# Patient Record
Sex: Male | Born: 1945
Health system: Southern US, Community
[De-identification: ages and names within clinical notes are randomized; demographics above are authoritative.]

## PROBLEM LIST (undated history)

## (undated) DIAGNOSIS — T148XXA Other injury of unspecified body region, initial encounter: Secondary | ICD-10-CM

## (undated) DIAGNOSIS — E785 Hyperlipidemia, unspecified: Secondary | ICD-10-CM

## (undated) DIAGNOSIS — I10 Essential (primary) hypertension: Secondary | ICD-10-CM

## (undated) DIAGNOSIS — Z87442 Personal history of urinary calculi: Secondary | ICD-10-CM

## (undated) DIAGNOSIS — I4891 Unspecified atrial fibrillation: Secondary | ICD-10-CM

## (undated) DIAGNOSIS — I499 Cardiac arrhythmia, unspecified: Secondary | ICD-10-CM

## (undated) DIAGNOSIS — J302 Other seasonal allergic rhinitis: Secondary | ICD-10-CM

## (undated) DIAGNOSIS — M1990 Unspecified osteoarthritis, unspecified site: Secondary | ICD-10-CM

## (undated) DIAGNOSIS — K219 Gastro-esophageal reflux disease without esophagitis: Secondary | ICD-10-CM

## (undated) DIAGNOSIS — B958 Unspecified staphylococcus as the cause of diseases classified elsewhere: Secondary | ICD-10-CM

## (undated) HISTORY — DX: Hyperlipidemia, unspecified: E78.5

## (undated) HISTORY — DX: Gastro-esophageal reflux disease without esophagitis: K21.9

## (undated) HISTORY — PX: BACK SURGERY: SHX140

## (undated) HISTORY — PX: TONSILLECTOMY: SUR1361

## (undated) HISTORY — DX: Unspecified osteoarthritis, unspecified site: M19.90

## (undated) HISTORY — PX: TOOTH EXTRACTION: SUR596

## (undated) HISTORY — DX: Essential (primary) hypertension: I10

## (undated) HISTORY — PX: SPINE SURGERY: SHX786

---

## 1993-01-13 HISTORY — PX: HAND SURGERY: SHX662

## 1997-10-04 ENCOUNTER — Emergency Department (HOSPITAL_COMMUNITY): Admission: EM | Admit: 1997-10-04 | Discharge: 1997-10-04 | Payer: Self-pay | Admitting: Emergency Medicine

## 1997-10-26 ENCOUNTER — Emergency Department (HOSPITAL_COMMUNITY): Admission: EM | Admit: 1997-10-26 | Discharge: 1997-10-26 | Payer: Self-pay | Admitting: Emergency Medicine

## 1997-11-08 ENCOUNTER — Ambulatory Visit (HOSPITAL_BASED_OUTPATIENT_CLINIC_OR_DEPARTMENT_OTHER): Admission: RE | Admit: 1997-11-08 | Discharge: 1997-11-08 | Payer: Self-pay | Admitting: Orthopedic Surgery

## 1997-11-26 ENCOUNTER — Encounter: Payer: Self-pay | Admitting: Emergency Medicine

## 1997-11-26 ENCOUNTER — Emergency Department (HOSPITAL_COMMUNITY): Admission: EM | Admit: 1997-11-26 | Discharge: 1997-11-26 | Payer: Self-pay | Admitting: Emergency Medicine

## 2003-10-24 ENCOUNTER — Ambulatory Visit: Payer: Self-pay | Admitting: *Deleted

## 2004-02-21 ENCOUNTER — Emergency Department (HOSPITAL_COMMUNITY): Admission: EM | Admit: 2004-02-21 | Discharge: 2004-02-21 | Payer: Self-pay | Admitting: Emergency Medicine

## 2005-05-14 ENCOUNTER — Encounter: Payer: Self-pay | Admitting: Emergency Medicine

## 2009-05-25 ENCOUNTER — Emergency Department (HOSPITAL_COMMUNITY): Admission: EM | Admit: 2009-05-25 | Discharge: 2009-05-25 | Payer: Self-pay | Admitting: Emergency Medicine

## 2012-04-19 ENCOUNTER — Ambulatory Visit (INDEPENDENT_AMBULATORY_CARE_PROVIDER_SITE_OTHER): Payer: Medicare Other | Admitting: Family Medicine

## 2012-04-19 ENCOUNTER — Encounter: Payer: Self-pay | Admitting: Family Medicine

## 2012-04-19 VITALS — BP 156/90 | HR 84 | Temp 97.8°F | Ht 68.0 in | Wt 197.2 lb

## 2012-04-19 DIAGNOSIS — M109 Gout, unspecified: Secondary | ICD-10-CM

## 2012-04-19 DIAGNOSIS — R5381 Other malaise: Secondary | ICD-10-CM

## 2012-04-19 DIAGNOSIS — I10 Essential (primary) hypertension: Secondary | ICD-10-CM

## 2012-04-19 DIAGNOSIS — E785 Hyperlipidemia, unspecified: Secondary | ICD-10-CM

## 2012-04-19 DIAGNOSIS — R5383 Other fatigue: Secondary | ICD-10-CM

## 2012-04-19 LAB — POCT CBC
Granulocyte percent: 78.4 %G (ref 37–80)
HCT, POC: 47.4 % (ref 43.5–53.7)
Hemoglobin: 16 g/dL (ref 14.1–18.1)
Lymph, poc: 1.8 (ref 0.6–3.4)
MCH, POC: 31.5 pg — AB (ref 27–31.2)
MCHC: 33.6 g/dL (ref 31.8–35.4)
MCV: 93.6 fL (ref 80–97)
MPV: 9.4 fL (ref 0–99.8)
POC Granulocyte: 8.2 — AB (ref 2–6.9)
POC LYMPH PERCENT: 17.7 %L (ref 10–50)
Platelet Count, POC: 177 10*3/uL (ref 142–424)
RBC: 5.1 M/uL (ref 4.69–6.13)
RDW, POC: 13.2 %
WBC: 10.4 10*3/uL — AB (ref 4.6–10.2)

## 2012-04-19 LAB — BASIC METABOLIC PANEL
BUN: 17 mg/dL (ref 6–23)
CO2: 28 mEq/L (ref 19–32)
Calcium: 9.5 mg/dL (ref 8.4–10.5)
Chloride: 103 mEq/L (ref 96–112)
Creat: 1.01 mg/dL (ref 0.50–1.35)
Glucose, Bld: 99 mg/dL (ref 70–99)
Potassium: 3.8 mEq/L (ref 3.5–5.3)
Sodium: 139 mEq/L (ref 135–145)

## 2012-04-19 LAB — URIC ACID: Uric Acid, Serum: 7.9 mg/dL — ABNORMAL HIGH (ref 4.0–6.0)

## 2012-04-20 DIAGNOSIS — I10 Essential (primary) hypertension: Secondary | ICD-10-CM | POA: Insufficient documentation

## 2012-04-20 DIAGNOSIS — M109 Gout, unspecified: Secondary | ICD-10-CM | POA: Insufficient documentation

## 2012-04-20 DIAGNOSIS — E785 Hyperlipidemia, unspecified: Secondary | ICD-10-CM | POA: Insufficient documentation

## 2012-04-20 NOTE — Progress Notes (Signed)
  Subjective:    Patient ID: STEAVEN WHOLEY, male    DOB: [birth year], 67 y.o.   MRN: [id number]  HPI This patient called the night before complaining of severe pain in his right foot. He thinks it is gout. His current meds include meclizine 25 twice daily as needed, lisinopril 2025 one fourth of a pill daily, and omeprazole 20 one daily as needed. He had been taking Naprosyn  over the counter 3 times daily as needed.   Review of Systems  Constitutional: Negative.   HENT: Positive for congestion.   Eyes: Negative.   Respiratory: Negative.   Cardiovascular: Negative.   Gastrointestinal:       GERD  Genitourinary: Negative.   Musculoskeletal: Positive for back pain, joint swelling, arthralgias and gait problem.       The reason for the visit was the severe pain in the right foot especially the big toe  Skin: Positive for color change.       Redness and erythema of the entire foot involved with the gout  Neurological:       Neuropathy in his legs from his back  Hematological: Negative.        Objective:   Physical Exam  Nursing note and vitals reviewed. Constitutional: He is oriented to person, place, and time. He appears well-developed and well-nourished. He appears distressed (due to foot pain).  HENT:  Head: Normocephalic and atraumatic.  Missing Teeth  Eyes: Conjunctivae and EOM are normal.  Neck: Normal range of motion. Neck supple. No JVD present. No thyromegaly present.  Cardiovascular: Normal rate, regular rhythm, normal heart sounds and intact distal pulses.  Exam reveals no gallop and no friction rub.   No murmur heard. Pulmonary/Chest: Effort normal and breath sounds normal. No respiratory distress. He has no wheezes. He has no rales.  Abdominal: Soft. He exhibits no distension and no mass. There is no tenderness. There is no rebound and no guarding.  Musculoskeletal: He exhibits edema and tenderness.  Right foot swollen red tender to palpation and painful with movement  especially the right great toe  Lymphadenopathy:    He has no cervical adenopathy.  Neurological: He is alert and oriented to person, place, and time. He has normal reflexes.  Skin: There is erythema.  Right foot and right great toe  Psychiatric: He has a normal mood and affect. His behavior is normal. Judgment and thought content normal.          Assessment & Plan:  1. Other malaise and fatigue - POCT CBC  2. Essential hypertension, benign - Basic metabolic panel  3. Gout, unspecified - Uric Acid  4. Hyperlipemia Will review lab work from the [place]  5. Discontinue lisinopril 20/25     Start lisinopril 20 one daily as directed       Prednisone 10 mg #20 taper            Colcrys .6mg   2 stat, then one by mouth one hour later. Then 1 daily       Allopurinol 100 mg, 1 daily to start in one week  6. Return to clinic in 10-14 days.      Bring home blood pressure readings, recheck blood pressure, recheck uric acid.

## 2012-04-20 NOTE — Patient Instructions (Addendum)
Take meds as directed Return to clinic in 10-14 days and bring blood pressure readings from home and recheck uric acid Elevate foot Needs cholesterol checked if this has not been done by the [place] [facility] lab work that was done at the [place] from home Will probably also need a rectal exam at some time in the future

## 2012-07-23 ENCOUNTER — Telehealth: Payer: Self-pay | Admitting: Family Medicine

## 2012-07-23 ENCOUNTER — Ambulatory Visit (INDEPENDENT_AMBULATORY_CARE_PROVIDER_SITE_OTHER): Payer: Medicare Other | Admitting: Family Medicine

## 2012-07-23 ENCOUNTER — Encounter: Payer: Self-pay | Admitting: Family Medicine

## 2012-07-23 VITALS — BP 154/97 | HR 90 | Temp 97.4°F | Wt 206.0 lb

## 2012-07-23 DIAGNOSIS — M109 Gout, unspecified: Secondary | ICD-10-CM

## 2012-07-23 DIAGNOSIS — N508 Other specified disorders of male genital organs: Secondary | ICD-10-CM

## 2012-07-23 DIAGNOSIS — N5089 Other specified disorders of the male genital organs: Secondary | ICD-10-CM

## 2012-07-23 NOTE — Patient Instructions (Signed)
Testicular Masses  Most testicular masses, such as a growth or a swelling, are benign. This means they are not cancerous. Common types of testicular masses include:    Hydrocele is the most common benign testicular mass in an adult. Hydroceles are generally soft, painless scrotal swellings that are collections of fluid in the scrotal sac. These can rapidly change size as the fluid enters or leaves.   Spermatoceles are generally soft, painless, benign swellings that are cyst-like masses in the scrotum containing fluid. They can rapidly change size as the fluid enters or leaves. They are more prominent while standing or exercising. Sometimes, spermatoceles may cause a sensation of heaviness or a dull ache.   Varicocele is an enlargement of the veins that drain the testicles. This condition can increase the risk of infertility. They are more prominent while standing or exercising. Sometimes, varicoceles may cause a sensation of heaviness or a dull ache.   Inguinal hernia is a bulge caused by a portion of intestine protruding into the scrotum through a weak area in the abdominal muscles. Hernias may or may not be painful. They are soft and usually enlarge with coughing or straining.   Torsion of the testis can cause a testicular mass that develops quickly and is associated with tenderness and/or fever. This is caused by a twisting of the testicle within the sac. It also reduces the blood supply and can destroy the testis if not treated quickly with surgery.   Epididymitis is inflammation of the epididymis (a structure attached to the testicle), usually caused by a sexually transmitted infection or a urinary tract infection. This generally shows up as testicular discomfort and swelling, and may include pain during urination.   Testicular appendages are remnants of tissue on the testis present since birth. A testicular appendage can twist on its blood supply and cause pain. In most cases, this is seen as a blue dot  on the scrotum.  A cancerous growth in the scrotum may first appear as a swelling. There may or may not be pain. The growth usually feels firm and shows up as a growth on the testicle. Any solid, firm growth in a testicle is considered cancer until proven otherwise.  Cancer of the testicle most commonly affects men 20 to 65 years old. Risk factors include prior testicular tumor and cryptorchidism (undescended testis). Occasionally, testicular cancer may appear with symptoms (problems) of metastasis. This means the tumor (abnormal growth) has spread and is causing other problems that may include cough, shortness of breath or weight loss. Monthly testicular self-exams are recommended for all men. Get in the habit of examining your own testicles. A good time is while taking a shower. Get to know what your testicles feel like so you will know if there is a new growth or change in them.  DIAGNOSIS   See your caregiver if you feel a growth in your testicle. Sometimes, all that is needed to make the diagnosis (determine what is wrong) is a physical exam. Your caregiver may shine a bright light through the scrotum to help make the diagnosis. This is called transillumination. The light will shine easily through a collection of fluid but will usually not shine through a tumor. Other testing, including blood tests and an ultrasound exam, may be done. An ultrasound exam bounces harmless sound waves off the testicles and produces a black and white picture almost like that produced by a camera. Diagnosis of testicular cancer can be made by measuring several substances in the   blood, called markers), that may indicate the presence of certain cancers.  TREATMENT   What is wrong determines how it is treated. Small hydroceles and spermatoceles often require no treatment. In some cases, however, they may be treated surgically. Hernias are repaired with surgery. Because epididymitis is usually caused by an infection, it is usually  treated with antibiotics. Varicoceles may be treated by surgery to tie off the affected veins. Testicular cancer treatment depends upon the type of cancer. Sometimes, some tissue is removed surgically as a way of trying to preserve the testicle but if a tumor is suspected, the preferred treatment is removal of the entire testicle. Further treatment may include watching the growth with strict follow-up, chemotherapy or radiation.  If a growth has been found in a testicle, your caregiver will help you determine the best treatment.  Document Released: 07/06/2002 Document Revised: 03/24/2011 Document Reviewed: 12/30/2004  [employer] Patient Information 2014 [employer].

## 2012-07-23 NOTE — Progress Notes (Signed)
  Subjective:    Patient ID: Jacob Sanders, male    DOB: [birth year], 67 y.o.   MRN: [id number]  HPI This 67 y.o. male presents for evaluation of  Gout flare in his right leg and left testicular mass. He just has started taking his allopurinol for hyperuricemia and gout about a week ago and notices Some twinges in his right foot that feel like gout.  He takes allopurinol prn for his gout he states.  He  Also takes naprosyn po qd prn arthralgias or gout pain.  Well this week his pain in his right foot radiated up To his groin and he noticed a mass on his left teste and wants it examined.   Review of Systems C/o left testicular mass and right foot discomfort.    No chest pain, SOB, HA, dizziness, vision change, N/V, diarrhea, constipation, dysuria, urinary urgency or frequency, myalgias, arthralgias or rash.  Objective:   Physical Exam Vital signs noted  Well developed well nourished male.  HEENT - Head atraumatic Normocephalic                Eyes - PERRLA, Conjuctiva - clear Sclera- Clear EOMI                Ears - EAC's Wnl TM's Wnl Gross Hearing WNL                Nose - Nares patent                 Throat - oropharanx wnl Respiratory - Lungs CTA bilateral Cardiac - RRR S1 and S2 without murmur GI - Abdomen soft Nontender and bowel sounds active x  GU - Soft round mass left testicle palpated. Extremities - No edema. Neuro - Grossly intact. MS -TTP right foot.      Assessment & Plan:  Testicular mass - Plan: US Scrotum.  Reassured him that this was probably just varicocele but will get [place] and make sure.  If any abnormalities will call and inform him What to do next.  Gout - Instructed patient to take his allopurinol qd.  If he is taking 300mg  then cut in half and take for a month before going up to 100mg .  If he is on 100mg  then take one po qd. Discussed why he needs to take the allopurinol daily and not prn.  Discussed that he should take the naprosyn 500mg  po bid for next  week or two while he is getting up on the  Allopurinol to a help avoid flares.  Follow up prn if having gout flare.

## 2012-07-23 NOTE — Telephone Encounter (Signed)
appt given for today 

## 2012-07-27 ENCOUNTER — Other Ambulatory Visit: Payer: Self-pay | Admitting: Family Medicine

## 2012-07-27 ENCOUNTER — Other Ambulatory Visit (HOSPITAL_COMMUNITY): Payer: Self-pay | Admitting: Family Medicine

## 2012-07-27 ENCOUNTER — Ambulatory Visit (HOSPITAL_COMMUNITY)
Admission: RE | Admit: 2012-07-27 | Discharge: 2012-07-27 | Disposition: A | Discharge status: Home / Self Care | Payer: Medicare Other | Source: Ambulatory Visit | Attending: Family Medicine | Admitting: Family Medicine

## 2012-07-27 DIAGNOSIS — N5089 Other specified disorders of the male genital organs: Secondary | ICD-10-CM

## 2012-07-27 DIAGNOSIS — N508 Other specified disorders of male genital organs: Secondary | ICD-10-CM | POA: Insufficient documentation

## 2012-07-27 IMAGING — US US SCROTUM
1 series · 13 of 25 positions shown · non-contrast
Comparison: There are

CLINICAL DATA: Left scrotal mass

SCROTAL ULTRASOUND
DOPPLER ULTRASOUND OF THE TESTICLES
TECHNIQUE: Complete ultrasound examination of the testicles,
epididymis, and other scrotal structures was performed.  Color and
spectral Doppler ultrasound were also utilized to evaluate blood
flow to the testicles.

[Series 1: us scrotum · 0.06mm/px · 13 of 72 slices shown]
[im 1/72]
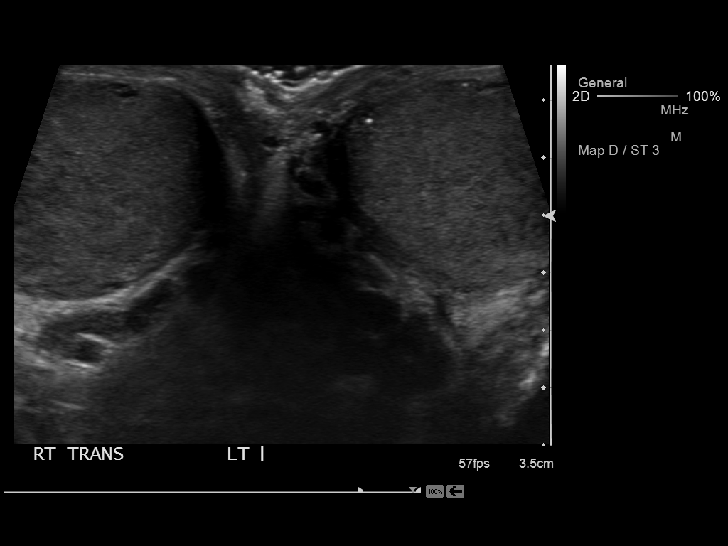
[im 6/72]
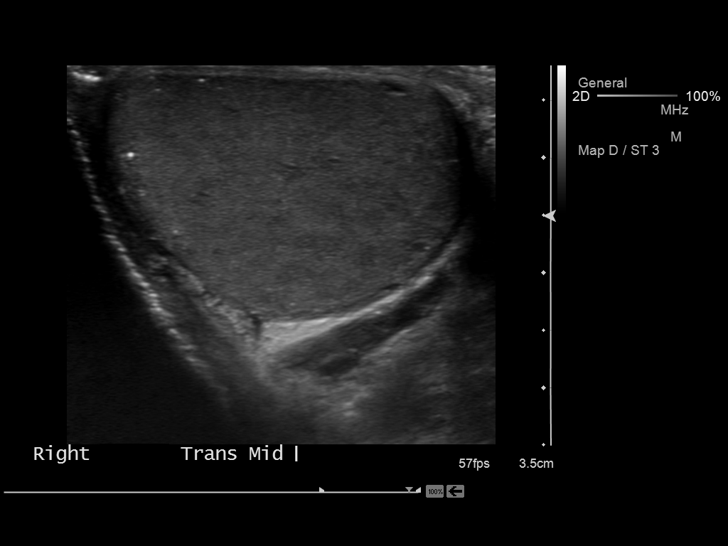
[im 12/72]
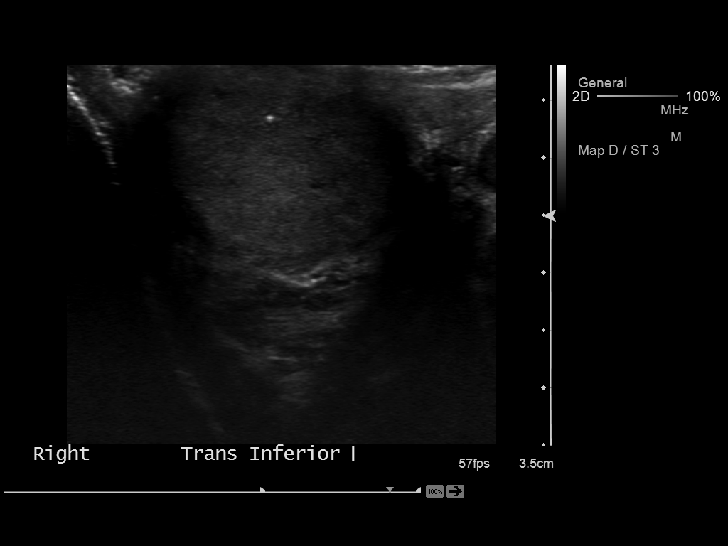
[im 18/72]
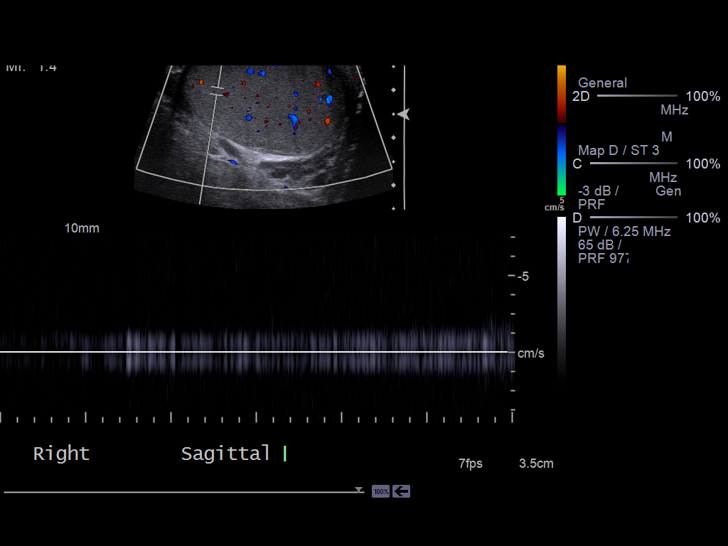
[im 24/72]
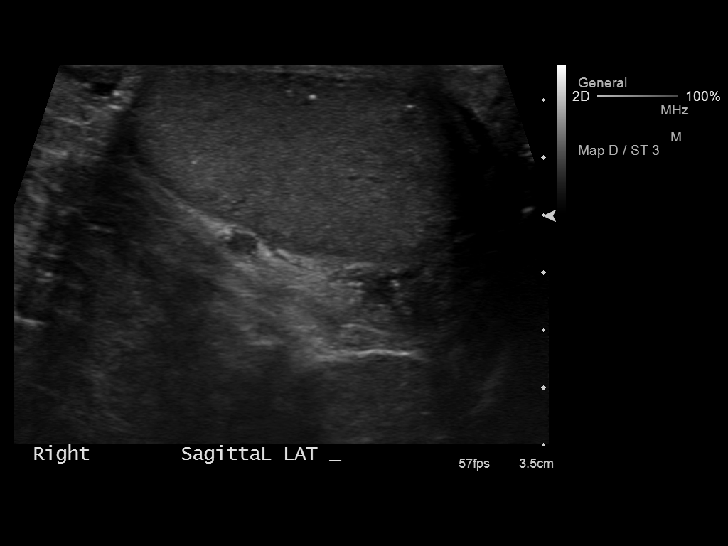
[im 30/72]
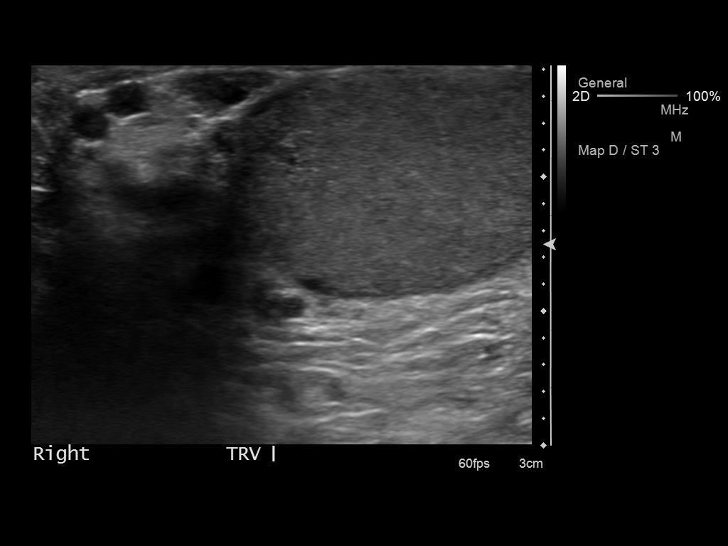
[im 36/72]
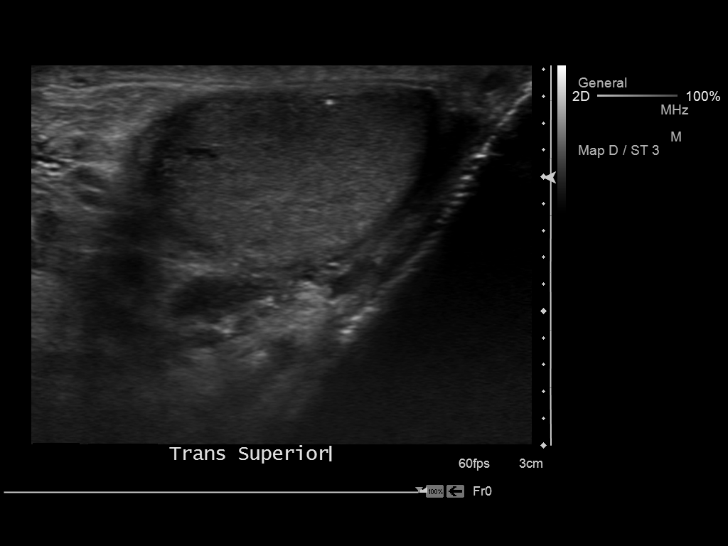
[im 42/72]
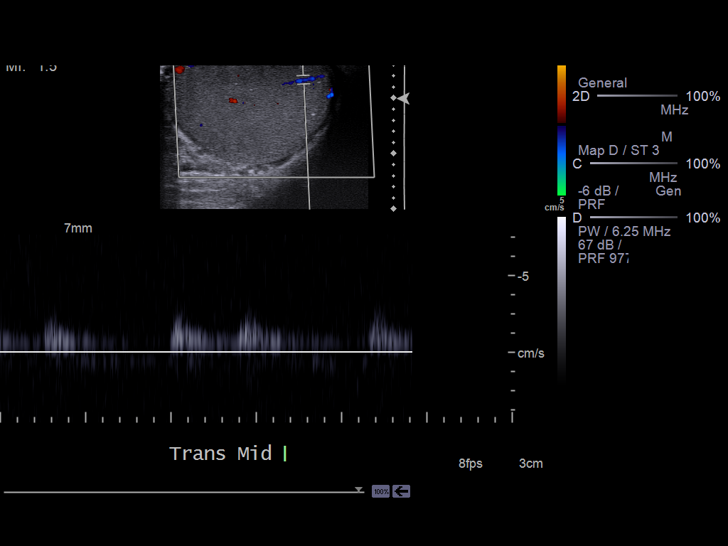
[im 48/72]
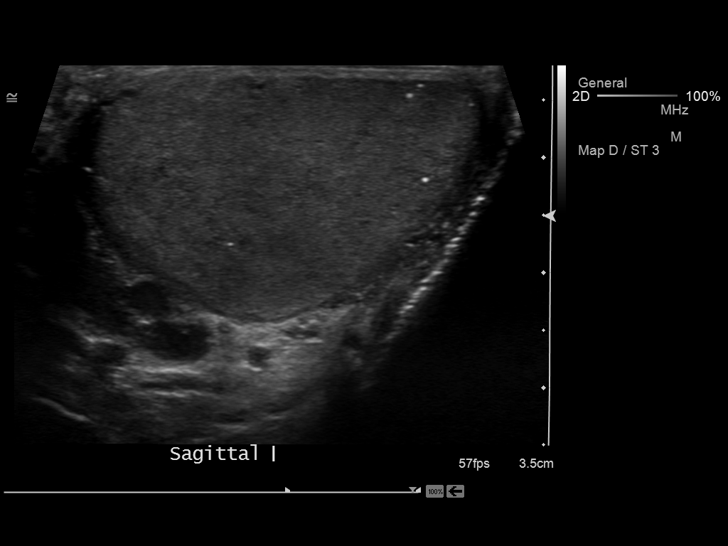
[im 54/72]
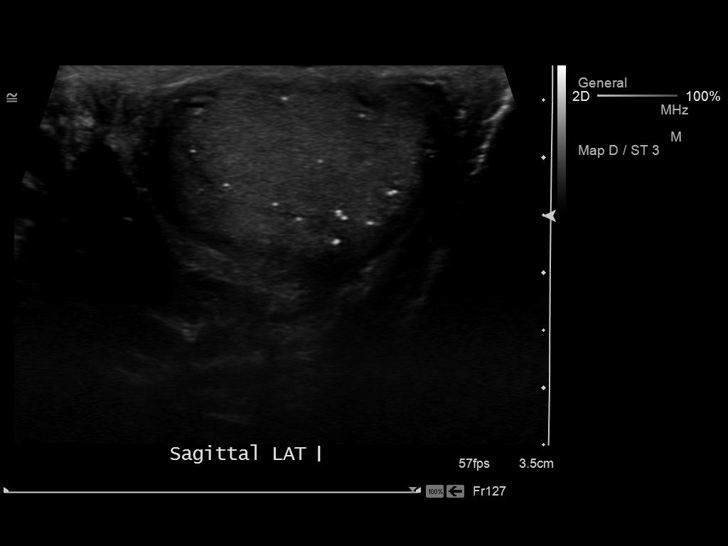
[im 60/72]
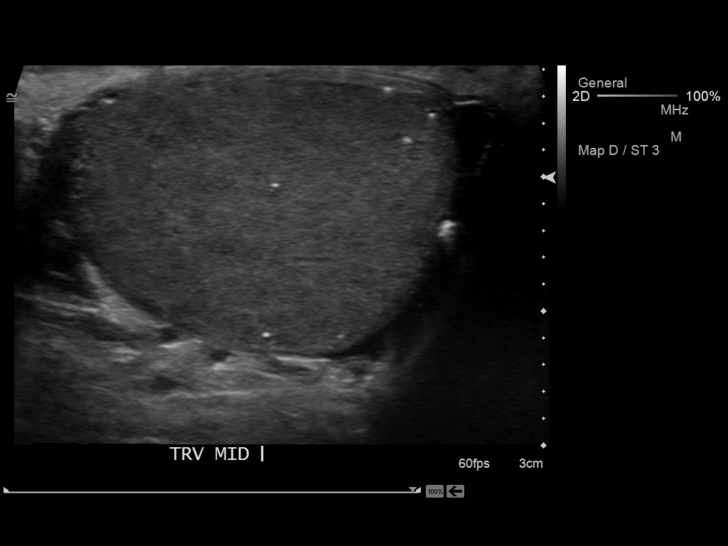
[im 66/72]
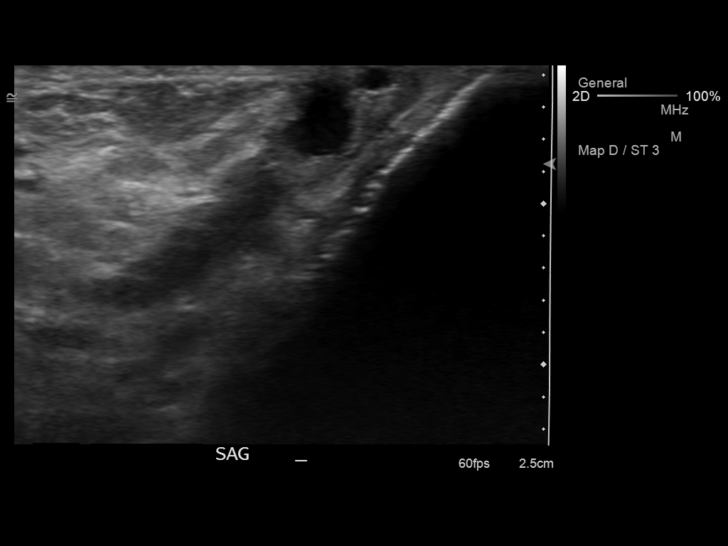
[im 72/72]
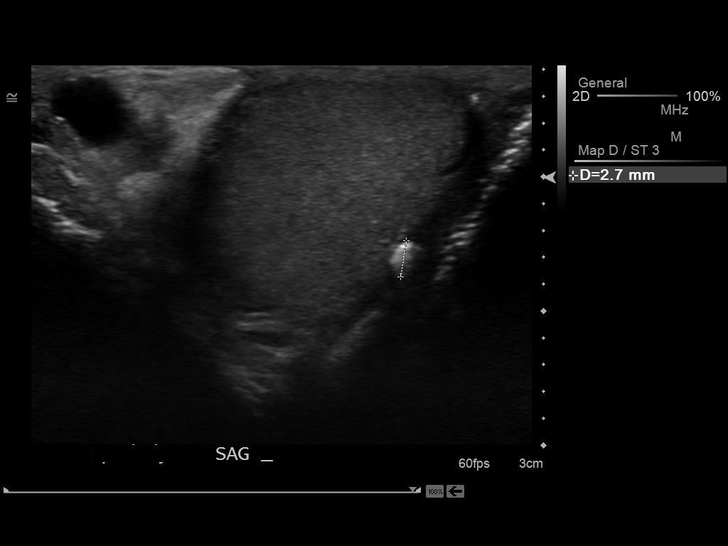

[13 of 25 positions shown; findings below may reference images not displayed]

FINDINGS: Right testis:  No solid mass.  Several punctate calcifications are
scattered throughout the right testicle.

Left testis:  No solid mass.  Several punctate calcifications are
scattered throughout the parenchyma.

Right epididymis:  4 cm simple cyst within the mid epididymis.
Otherwise within normal limits.

Left epididymis:  6 mm simple cyst within the mid left epididymis.
No solid mass.

Hydrocele:  Absent.

Varicocele:  Absent.

Pulsed Doppler interrogation of both testes demonstrates normal
appearing arterial and venous waveforms within both testicles.
IMPRESSION: No evidence of testicular mass.

6 mm simple left epididymal cyst likely corresponding to the
palpable abnormality.

Bilateral testicular microlithiasis.  This is a risk factor in the
development of testicular cancer.  Follow-up is based on a
combination of imaging and clinical risk factors.

## 2012-07-29 ENCOUNTER — Encounter: Payer: Self-pay | Admitting: Physician Assistant

## 2012-07-29 ENCOUNTER — Telehealth: Payer: Self-pay | Admitting: Physician Assistant

## 2012-07-29 ENCOUNTER — Ambulatory Visit (INDEPENDENT_AMBULATORY_CARE_PROVIDER_SITE_OTHER): Payer: Medicare Other | Admitting: Physician Assistant

## 2012-07-29 VITALS — BP 187/110 | HR 84 | Temp 98.3°F | Ht 68.0 in | Wt 203.0 lb

## 2012-07-29 DIAGNOSIS — M25561 Pain in right knee: Secondary | ICD-10-CM

## 2012-07-29 DIAGNOSIS — M25569 Pain in unspecified knee: Secondary | ICD-10-CM

## 2012-07-29 DIAGNOSIS — M25551 Pain in right hip: Secondary | ICD-10-CM

## 2012-07-29 DIAGNOSIS — M25559 Pain in unspecified hip: Secondary | ICD-10-CM

## 2012-07-29 NOTE — Patient Instructions (Signed)
Hip Pain  The hips join the upper legs to the lower pelvis. The bones, cartilage, tendons, and muscles of the hip joint perform a lot of work each day holding your body weight and allowing you to move around.  Hip pain is a common symptom. It can range from a minor ache to severe pain on 1 or both hips. Pain may be felt on the inside of the hip joint near the groin, or the outside near the buttocks and upper thigh. There may be swelling or stiffness as well. It occurs more often when a person walks or performs activity. There are many reasons hip pain can develop.  CAUSES   It is important to work with your caregiver to identify the cause since many conditions can impact the bones, cartilage, muscles, and tendons of the hips. Causes for hip pain include:  · Broken (fractured) bones.  · Separation of the thighbone from the hip socket (dislocation).  · Torn cartilage of the hip joint.  · Swelling (inflammation) of a tendon (tendonitis), the sac within the hip joint (bursitis), or a joint.  · A weakening in the abdominal wall (hernia), affecting the nerves to the hip.  · Arthritis in the hip joint or lining of the hip joint.  · Pinched nerves in the back, hip, or upper thigh.  · A bulging disc in the spine (herniated disc).  · Rarely, bone infection or cancer.  DIAGNOSIS   The location of your hip pain will help your caregiver understand what may be causing the pain. A diagnosis is based on your medical history, your symptoms, results from your physical exam, and results from diagnostic tests. Diagnostic tests may include X-ray exams, a computerized magnetic scan (magnetic resonance imaging, MRI), or bone scan.  TREATMENT   Treatment will depend on the cause of your hip pain. Treatment may include:  · Limiting activities and resting until symptoms improve.  · Crutches or other walking supports (a cane or brace).  · Ice, elevation, and compression.  · Physical therapy or home exercises.  · Shoe inserts or special  shoes.  · Losing weight.  · Medications to reduce pain.  · Undergoing surgery.  HOME CARE INSTRUCTIONS   · Only take over-the-counter or prescription medicines for pain, discomfort, or fever as directed by your caregiver.  · Put ice on the injured area:  · Put ice in a plastic bag.  · Place a towel between your skin and the bag.  · Leave the ice on for 15-20 minutes at a time, 3-4 times a day.  · Keep your leg raised (elevated) when possible to lessen swelling.  · Avoid activities that cause pain.  · Follow specific exercises as directed by your caregiver.  · Sleep with a pillow between your legs on your most comfortable side.  · Record how often you have hip pain, the location of the pain, and what it feels like. This information may be helpful to you and your caregiver.  · Ask your caregiver about returning to work or sports and whether you should drive.  · Follow up with your caregiver for further exams, therapy, or testing as directed.  SEEK MEDICAL CARE IF:   · Your pain or swelling continues or worsens after 1 week.  · You are feeling unwell or have chills.  · You have increasing difficulty with walking.  · You have a loss of sensation or other new symptoms.  · You have questions or concerns.  SEEK   IMMEDIATE MEDICAL CARE IF:   · You cannot put weight on the affected hip.  · You have fallen.  · You have a sudden increase in pain and swelling in your hip.  · You have a fever.  MAKE SURE YOU:   · Understand these instructions.  · Will watch your condition.  · Will get help right away if you are not doing well or get worse.  Document Released: 06/19/2009 Document Revised: 03/24/2011 Document Reviewed: 06/19/2009  [employer]® Patient Information ©2014 [employer].

## 2012-07-29 NOTE — Progress Notes (Signed)
Subjective:     Patient ID: Jacob Sanders, male   DOB: 01-01-1946, 67 y.o.   MRN: [id number]  HPI Pt with pain to the R leg He states sometimes sx will be to the lower leg at other times will radiate up the entire leg Pt with a hx of gout, degen disc dz, and OA to mult joints He denies any trauma to the leg  Review of Systems  All other systems reviewed and are negative.       Objective:   Physical Exam NAD- sitting comfortably No edema noted to the R lower leg Good pulses/sensory No TTP foot/ankle/calf FROM foot/ankle FROM of the knee- no crepitus noted Sl TTP quad area Sx with lateral abduction/ frog leg of the R hip Good strength     Assessment:     R leg pain    Plan:     Pt with OA of mult joints Suspect same of the hip Pt defers Xray today Cont with all meds He would like to just cont with OTC NSAID F/U if sx cont or change

## 2012-07-29 NOTE — Telephone Encounter (Signed)
He has a epididymis cyst which is a benign lesion but if it is hurting him he needs to see urology so he would need a urology referral.  If he has hip or knee pain this is an orthopedic condition and he needs a follow up appointment to be evaluated and tx'd for thisl.

## 2012-07-29 NOTE — Telephone Encounter (Signed)
Patient has an appointment today with Jacob Sanders

## 2013-03-22 ENCOUNTER — Ambulatory Visit: Payer: Medicare Other | Admitting: Family Medicine

## 2013-06-20 ENCOUNTER — Encounter: Payer: Self-pay | Admitting: Family Medicine

## 2013-06-20 ENCOUNTER — Ambulatory Visit (INDEPENDENT_AMBULATORY_CARE_PROVIDER_SITE_OTHER): Payer: Medicare Other | Admitting: Family Medicine

## 2013-06-20 ENCOUNTER — Telehealth: Payer: Self-pay | Admitting: Family Medicine

## 2013-06-20 VITALS — BP 148/88 | HR 46 | Temp 96.8°F | Ht 68.0 in | Wt 200.6 lb

## 2013-06-20 DIAGNOSIS — I10 Essential (primary) hypertension: Secondary | ICD-10-CM

## 2013-06-20 DIAGNOSIS — J329 Chronic sinusitis, unspecified: Secondary | ICD-10-CM

## 2013-06-20 MED ORDER — FLUTICASONE PROPIONATE 50 MCG/ACT NA SUSP
2.0000 | Freq: Every day | NASAL | Status: DC
Start: 2013-06-20 — End: 2014-03-10

## 2013-06-20 MED ORDER — MELOXICAM 15 MG PO TABS: 15.0000 mg | ORAL_TABLET | Freq: Every day | ORAL | Status: DC

## 2013-06-20 MED ORDER — AMLODIPINE BESYLATE 10 MG PO TABS: 10.0000 mg | ORAL_TABLET | Freq: Every day | ORAL | Status: DC

## 2013-06-20 MED ORDER — AMOXICILLIN 875 MG PO TABS: 875.0000 mg | ORAL_TABLET | Freq: Two times a day (BID) | ORAL | Status: DC

## 2013-06-20 NOTE — Progress Notes (Signed)
   Subjective:    Patient ID: Jacob Sanders, male    DOB: April 21, 1945, 68 y.o.   MRN: [id number]  HPI  This 68 y.o. male presents for evaluation of elevated blood pressure.  He went to his dentist and was told his bp was elevated and needed to get a letter from his doctor stating he can have dental work. He has been having a lot of arthritis problems and has been having some pain from arthritis.  Review of Systems C/o elevated bp and arthritis   No chest pain, SOB, HA, dizziness, vision change, N/V, diarrhea, constipation, dysuria, urinary urgency or frequency, myalgias, arthralgias or rash.  Objective:   Physical Exam  Vital signs noted  Well developed well nourished male.  HEENT - Head atraumatic Normocephalic                Eyes - PERRLA, Conjuctiva - clear Sclera- Clear EOMI                Ears - EAC's Wnl TM's Wnl Gross Hearing WNL                Throat - oropharanx wnl Respiratory - Lungs CTA bilateral Cardiac - RRR S1 and S2 without murmur.  BP 180/110 GI - Abdomen soft Nontender and bowel sounds active x 4 Extremities - No edema. Neuro - Grossly intact.      Assessment & Plan:  Essential hypertension, benign - Plan: amLODipine (NORVASC) 10 MG tablet and continue lisinopril 20 mg po qd.  Follow up in 2 weeks for repeat bp and note if bp is down to reasonable number.  Sinusitis - Plan: fluticasone (FLONASE) 50 MCG/ACT nasal  Spray and amoxicillin 875mg  one po bid x 2 weeks  Arthritis - Mobic 15mg  po qd  Tobacco abuse - Discussed he needs to quit smoking.  Deatra Canter FNP

## 2013-06-20 NOTE — Telephone Encounter (Signed)
appt at 12 today with [facility]

## 2014-02-13 DIAGNOSIS — M545 Low back pain: Secondary | ICD-10-CM | POA: Diagnosis not present

## 2014-02-13 DIAGNOSIS — M47816 Spondylosis without myelopathy or radiculopathy, lumbar region: Secondary | ICD-10-CM | POA: Diagnosis not present

## 2014-02-13 DIAGNOSIS — Z6829 Body mass index (BMI) 29.0-29.9, adult: Secondary | ICD-10-CM | POA: Diagnosis not present

## 2014-02-13 DIAGNOSIS — M5416 Radiculopathy, lumbar region: Secondary | ICD-10-CM | POA: Diagnosis not present

## 2014-02-22 DIAGNOSIS — M5126 Other intervertebral disc displacement, lumbar region: Secondary | ICD-10-CM | POA: Diagnosis not present

## 2014-02-22 DIAGNOSIS — M5416 Radiculopathy, lumbar region: Secondary | ICD-10-CM | POA: Diagnosis not present

## 2014-02-22 DIAGNOSIS — M47816 Spondylosis without myelopathy or radiculopathy, lumbar region: Secondary | ICD-10-CM | POA: Diagnosis not present

## 2014-02-22 DIAGNOSIS — M5136 Other intervertebral disc degeneration, lumbar region: Secondary | ICD-10-CM | POA: Diagnosis not present

## 2014-03-09 ENCOUNTER — Telehealth: Payer: Self-pay | Admitting: Family Medicine

## 2014-03-10 ENCOUNTER — Encounter: Payer: Self-pay | Admitting: Family

## 2014-03-10 ENCOUNTER — Ambulatory Visit (INDEPENDENT_AMBULATORY_CARE_PROVIDER_SITE_OTHER): Payer: Medicare Other | Admitting: Family

## 2014-03-10 ENCOUNTER — Ambulatory Visit (INDEPENDENT_AMBULATORY_CARE_PROVIDER_SITE_OTHER): Payer: Medicare Other

## 2014-03-10 VITALS — BP 143/98 | HR 74 | Temp 97.4°F | Ht 68.0 in | Wt 206.0 lb

## 2014-03-10 DIAGNOSIS — M25552 Pain in left hip: Secondary | ICD-10-CM

## 2014-03-10 DIAGNOSIS — M1990 Unspecified osteoarthritis, unspecified site: Secondary | ICD-10-CM

## 2014-03-10 DIAGNOSIS — M25559 Pain in unspecified hip: Secondary | ICD-10-CM | POA: Diagnosis not present

## 2014-03-10 DIAGNOSIS — R5383 Other fatigue: Secondary | ICD-10-CM | POA: Diagnosis not present

## 2014-03-10 DIAGNOSIS — M129 Arthropathy, unspecified: Secondary | ICD-10-CM | POA: Diagnosis not present

## 2014-03-10 IMAGING — CR DG HIP (WITH OR WITHOUT PELVIS) 2-3V*L*
3 series · 3 of 3 positions shown · non-contrast
Comparison: None.

CLINICAL DATA: Left hip pain for several years.

EXAM:
LEFT HIP (WITH PELVIS) 2-3 VIEWS

[view not recorded (1 of 3)]
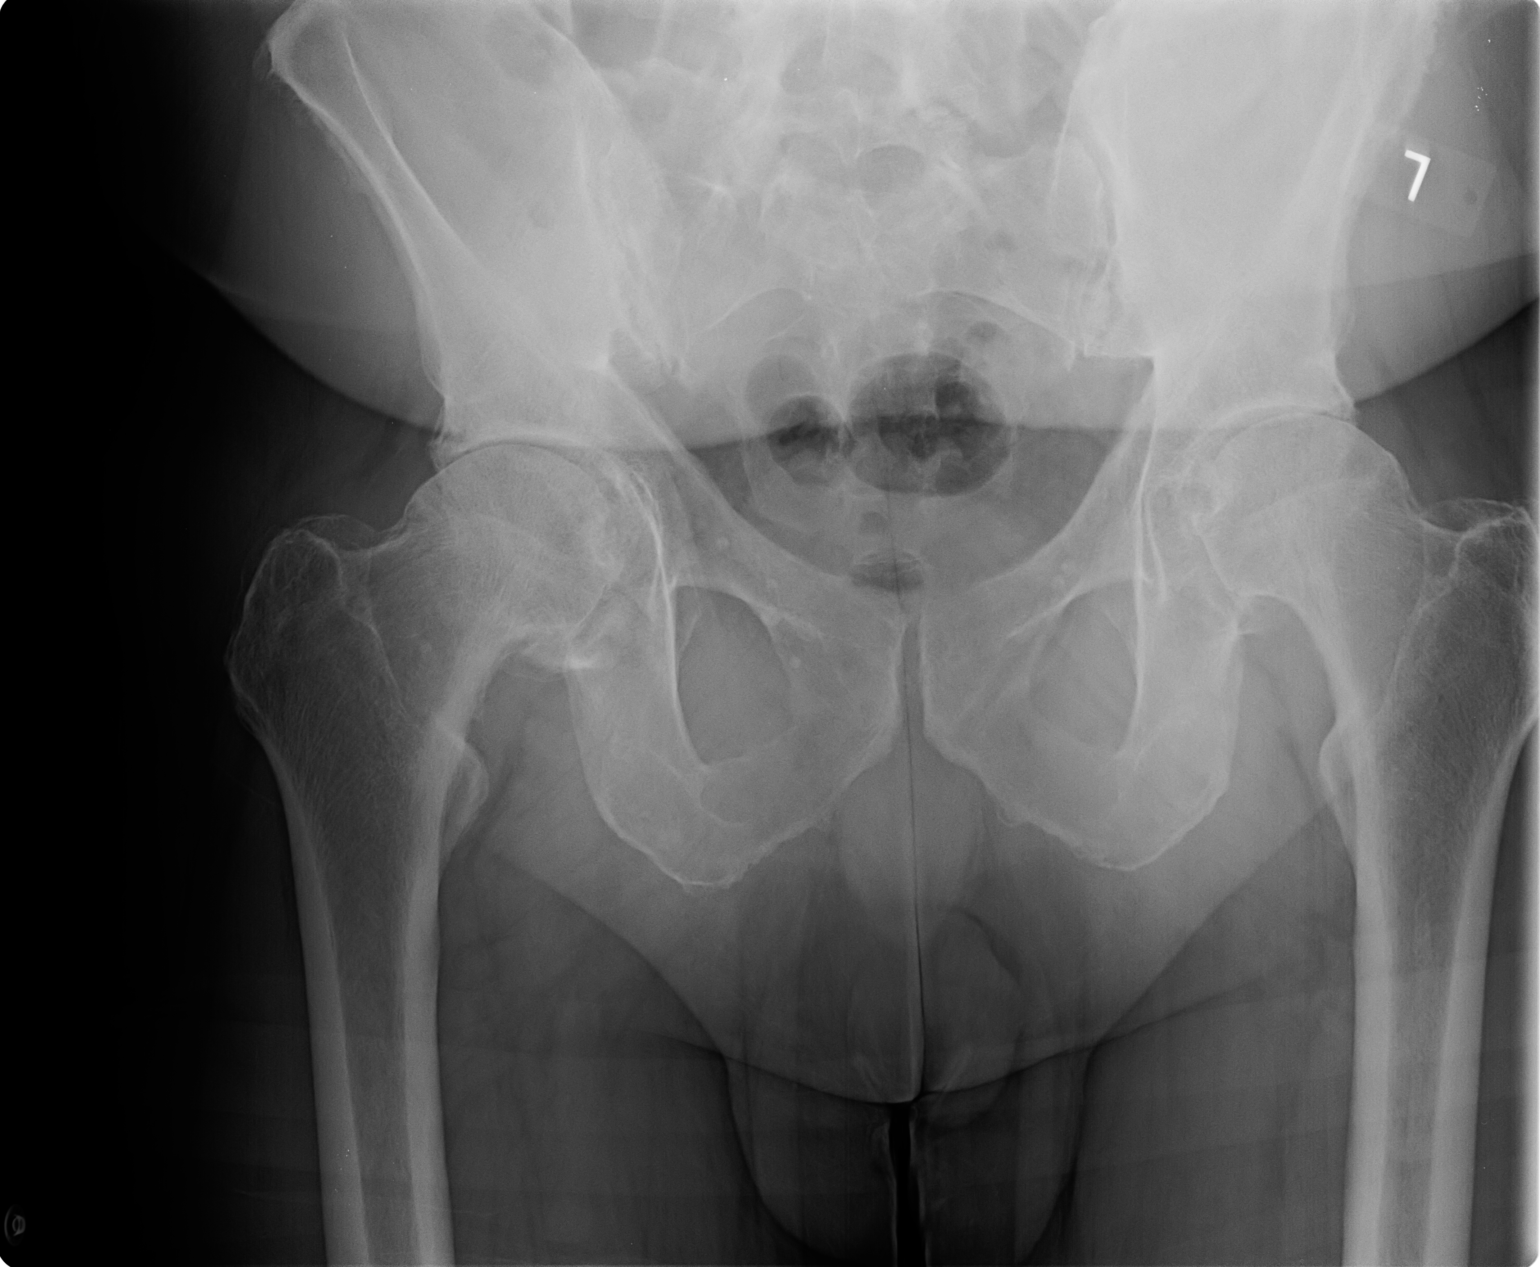

[view not recorded (2 of 3)]
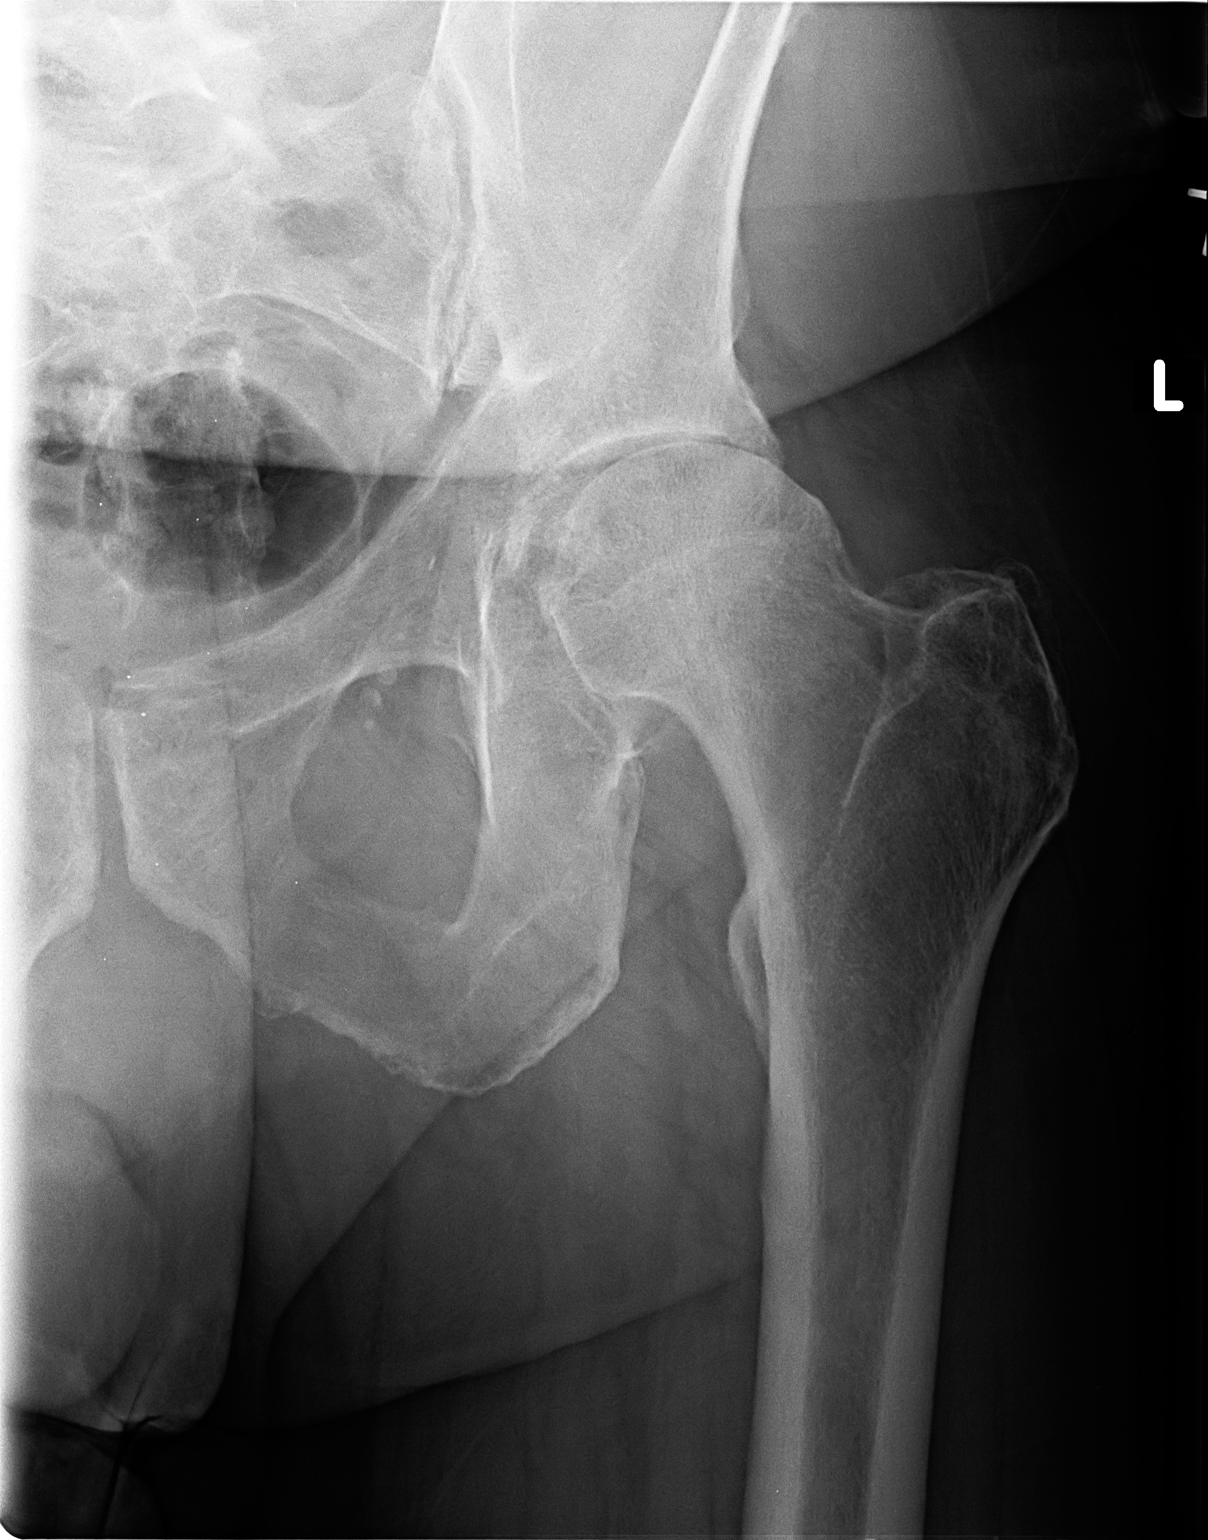

[view not recorded (3 of 3)]
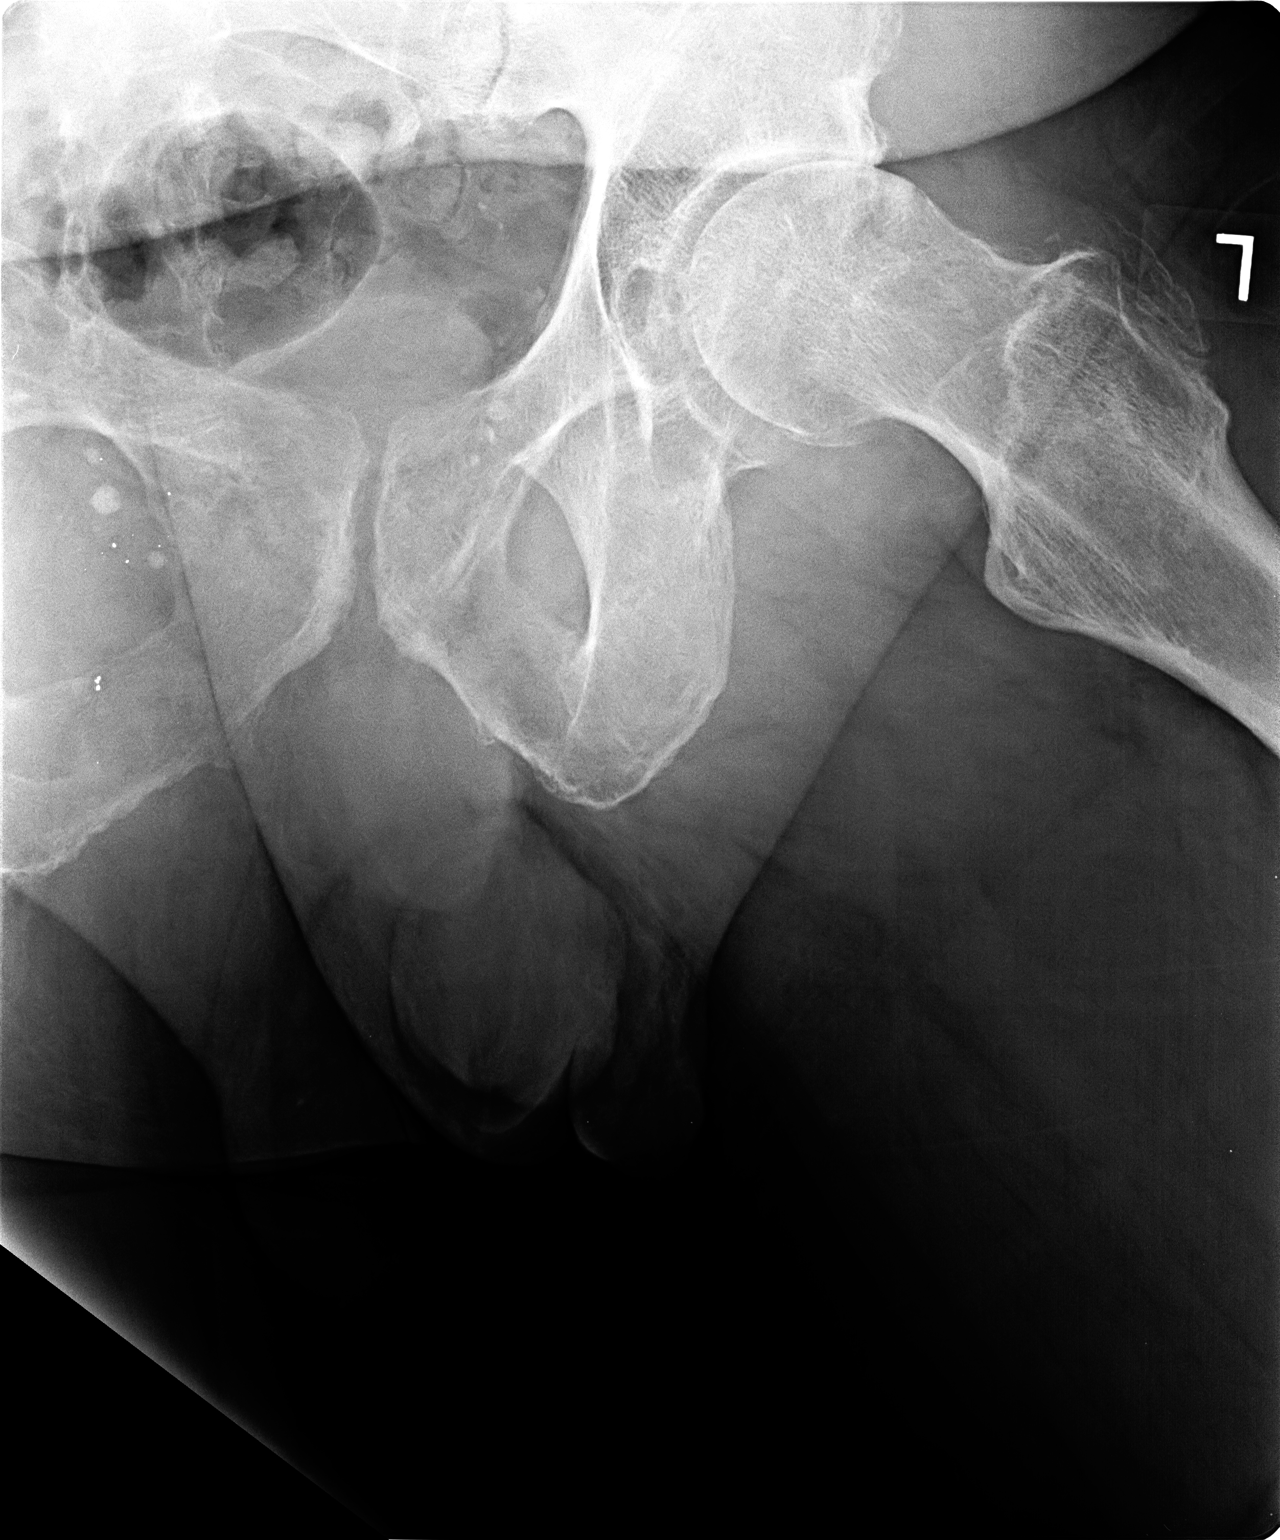

[3 of 3 positions shown; findings below may reference images not displayed]

FINDINGS: Moderate to advanced degenerative changes in the hips bilaterally
with near complete joint space loss. Mild spurring. SI joints are
symmetric and unremarkable. No acute bony abnormality. Specifically,
no fracture, subluxation, or dislocation. Soft tissues are intact.
IMPRESSION: Moderate to advanced degenerative changes in the hips bilaterally.
No acute findings.

## 2014-03-10 MED ORDER — METHYLPREDNISOLONE (PAK) 4 MG PO TABS: ORAL_TABLET | ORAL | Status: DC

## 2014-03-10 NOTE — Progress Notes (Signed)
   Subjective:    Patient ID: Jacob Sanders, male    DOB: 15-Oct-1945, 69 y.o.   MRN: [id number]  Pt presents to the office for bilateral hip pain and bilateral knee pain. Pt states he has been diagnosed with osteoarthritis in the past. Pt currently taking mobic 15 mg daily. Pt states it takes "the edge off".   Hip Pain  The incident occurred more than 1 week ago. There was no injury mechanism. The pain is present in the left hip, right hip, right knee and left knee. The pain is at a severity of 7/10. The pain is moderate. The pain has been constant since onset.      Review of Systems  Constitutional: Negative.   HENT: Negative.   Respiratory: Negative.   Cardiovascular: Negative.   Gastrointestinal: Negative.   Endocrine: Negative.   Genitourinary: Negative.   Musculoskeletal: Negative.   Neurological: Negative.   Hematological: Negative.   Psychiatric/Behavioral: Negative.   All other systems reviewed and are negative.         Objective:   Physical Exam  Constitutional: He is oriented to person, place, and time. He appears well-developed and well-nourished. No distress.  HENT:  Head: Normocephalic.  Right Ear: External ear normal.  Left Ear: External ear normal.  Nose: Nose normal.  Mouth/Throat: Oropharynx is clear and moist.  Eyes: Pupils are equal, round, and reactive to light. Right eye exhibits no discharge. Left eye exhibits no discharge.  Neck: Normal range of motion. Neck supple. No thyromegaly present.  Cardiovascular: Normal rate, regular rhythm, normal heart sounds and intact distal pulses.   No murmur heard. Pulmonary/Chest: Effort normal and breath sounds normal. No respiratory distress. He has no wheezes.  Abdominal: Soft. Bowel sounds are normal. He exhibits no distension. There is no tenderness.  Musculoskeletal: Normal range of motion. He exhibits no edema or tenderness.  Neurological: He is alert and oriented to person, place, and time. He has normal  reflexes. No cranial nerve deficit.  Skin: Skin is warm and dry. No rash noted. No erythema.  Psychiatric: He has a normal mood and affect. His behavior is normal. Judgment and thought content normal.  Vitals reviewed.   BP 143/98 mmHg  Pulse 74  Temp(Src) 97.4 F (36.3 C) (Oral)  Ht $R'5\' 8"'oc$  (1.727 m)  Wt 206 lb (93.441 kg)  BMI 31.33 kg/m2  X-ray-Joint narrowing, arthritis Preliminary reading by Evelina Dun, FNP [facility]      Assessment & Plan:  1. Other fatigue - CMP14+EGFR - Thyroid Panel With TSH - Arthritis Panel  2. Arthritis -Rest -Elevation -Ice - CMP14+EGFR - Arthritis Panel -RTO prn - DG Pelvis 1-2 Views; Future - methylPREDNIsolone (MEDROL DOSPACK) 4 MG tablet; follow package directions  Dispense: 21 tablet; Refill: 0  3. Left hip pain - CMP14+EGFR - Arthritis Panel - DG Pelvis 1-2 Views; Future - methylPREDNIsolone (MEDROL DOSPACK) 4 MG tablet; follow package directions  Dispense: 21 tablet; Refill: 0  Evelina Dun, FNP

## 2014-03-10 NOTE — Patient Instructions (Signed)
Arthritis, Nonspecific °Arthritis is inflammation of a joint. This usually means pain, redness, warmth or swelling are present. One or more joints may be involved. There are a number of types of arthritis. Your caregiver may not be able to tell what type of arthritis you have right away. °CAUSES  °The most common cause of arthritis is the wear and tear on the joint (osteoarthritis). This causes damage to the cartilage, which can break down over time. The knees, hips, back and neck are most often affected by this type of arthritis. °Other types of arthritis and common causes of joint pain include: °· Sprains and other injuries near the joint. Sometimes minor sprains and injuries cause pain and swelling that develop hours later. °· Rheumatoid arthritis. This affects hands, feet and knees. It usually affects both sides of your body at the same time. It is often associated with chronic ailments, fever, weight loss and general weakness. °· Crystal arthritis. Gout and pseudo gout can cause occasional acute severe pain, redness and swelling in the foot, ankle, or knee. °· Infectious arthritis. Bacteria can get into a joint through a break in overlying skin. This can cause infection of the joint. Bacteria and viruses can also spread through the blood and affect your joints. °· Drug, infectious and allergy reactions. Sometimes joints can become mildly painful and slightly swollen with these types of illnesses. °SYMPTOMS  °· Pain is the main symptom. °· Your joint or joints can also be red, swollen and warm or hot to the touch. °· You may have a fever with certain types of arthritis, or even feel overall ill. °· The joint with arthritis will hurt with movement. Stiffness is present with some types of arthritis. °DIAGNOSIS  °Your caregiver will suspect arthritis based on your description of your symptoms and on your exam. Testing may be needed to find the type of arthritis: °· Blood and sometimes urine tests. °· X-ray tests  and sometimes CT or MRI scans. °· Removal of fluid from the joint (arthrocentesis) is done to check for bacteria, crystals or other causes. Your caregiver (or a specialist) will numb the area over the joint with a local anesthetic, and use a needle to remove joint fluid for examination. This procedure is only minimally uncomfortable. °· Even with these tests, your caregiver may not be able to tell what kind of arthritis you have. Consultation with a specialist (rheumatologist) may be helpful. °TREATMENT  °Your caregiver will discuss with you treatment specific to your type of arthritis. If the specific type cannot be determined, then the following general recommendations may apply. °Treatment of severe joint pain includes: °· Rest. °· Elevation. °· Anti-inflammatory medication (for example, ibuprofen) may be prescribed. Avoiding activities that cause increased pain. °· Only take over-the-counter or prescription medicines for pain and discomfort as recommended by your caregiver. °· Cold packs over an inflamed joint may be used for 10 to 15 minutes every hour. Hot packs sometimes feel better, but do not use overnight. Do not use hot packs if you are diabetic without your caregiver's permission. °· A cortisone shot into arthritic joints may help reduce pain and swelling. °· Any acute arthritis that gets worse over the next 1 to 2 days needs to be looked at to be sure there is no joint infection. °Long-term arthritis treatment involves modifying activities and lifestyle to reduce joint stress jarring. This can include weight loss. Also, exercise is needed to nourish the joint cartilage and remove waste. This helps keep the muscles   around the joint strong. °HOME CARE INSTRUCTIONS  °· Do not take aspirin to relieve pain if gout is suspected. This elevates uric acid levels. °· Only take over-the-counter or prescription medicines for pain, discomfort or fever as directed by your caregiver. °· Rest the joint as much as  possible. °· If your joint is swollen, keep it elevated. °· Use crutches if the painful joint is in your leg. °· Drinking plenty of fluids may help for certain types of arthritis. °· Follow your caregiver's dietary instructions. °· Try low-impact exercise such as: °¨ Swimming. °¨ Water aerobics. °¨ Biking. °¨ Walking. °· Morning stiffness is often relieved by a warm shower. °· Put your joints through regular range-of-motion. °SEEK MEDICAL CARE IF:  °· You do not feel better in 24 hours or are getting worse. °· You have side effects to medications, or are not getting better with treatment. °SEEK IMMEDIATE MEDICAL CARE IF:  °· You have a fever. °· You develop severe joint pain, swelling or redness. °· Many joints are involved and become painful and swollen. °· There is severe back pain and/or leg weakness. °· You have loss of bowel or bladder control. °Document Released: 02/07/2004 Document Revised: 03/24/2011 Document Reviewed: 02/23/2008 °ExitCare® Patient Information ©2015 [employer]. This information is not intended to replace advice given to you by your health care provider. Make sure you discuss any questions you have with your health care provider. ° °

## 2014-03-11 LAB — CMP14+EGFR
ALBUMIN: 4.4 g/dL (ref 3.6–4.8)
ALK PHOS: 97 IU/L (ref 39–117)
ALT: 11 IU/L (ref 0–44)
AST: 13 IU/L (ref 0–40)
Albumin/Globulin Ratio: 1.8 (ref 1.1–2.5)
BUN/Creatinine Ratio: 19 (ref 10–22)
BUN: 15 mg/dL (ref 8–27)
Bilirubin Total: 0.5 mg/dL (ref 0.0–1.2)
CALCIUM: 9.5 mg/dL (ref 8.6–10.2)
CHLORIDE: 103 mmol/L (ref 97–108)
CO2: 24 mmol/L (ref 18–29)
Creatinine, Ser: 0.77 mg/dL (ref 0.76–1.27)
GFR calc Af Amer: 109 mL/min/{1.73_m2} (ref >59)
GFR calc non Af Amer: 95 mL/min/{1.73_m2} (ref >59)
GLOBULIN, TOTAL: 2.4 g/dL (ref 1.5–4.5)
GLUCOSE: 93 mg/dL (ref 65–99)
Potassium: 3.9 mmol/L (ref 3.5–5.2)
SODIUM: 143 mmol/L (ref 134–144)
TOTAL PROTEIN: 6.8 g/dL (ref 6.0–8.5)

## 2014-03-11 LAB — ARTHRITIS PANEL
Basophils Absolute: 0.1 10*3/uL (ref 0.0–0.2)
Basos: 1 %
EOS ABS: 0.2 10*3/uL (ref 0.0–0.4)
EOS: 2 %
HEMATOCRIT: 44.7 % (ref 37.5–51.0)
HEMOGLOBIN: 15.8 g/dL (ref 12.6–17.7)
IMMATURE GRANS (ABS): 0 10*3/uL (ref 0.0–0.1)
IMMATURE GRANULOCYTES: 0 %
Lymphocytes Absolute: 2.4 10*3/uL (ref 0.7–3.1)
Lymphs: 27 %
MCH: 32.9 pg (ref 26.6–33.0)
MCHC: 35.3 g/dL (ref 31.5–35.7)
MCV: 93 fL (ref 79–97)
MONOCYTES: 8 %
MONOS ABS: 0.6 10*3/uL (ref 0.1–0.9)
Neutrophils Absolute: 5.3 10*3/uL (ref 1.4–7.0)
Neutrophils Relative %: 62 %
Platelets: 268 10*3/uL (ref 150–379)
RBC: 4.8 x10E6/uL (ref 4.14–5.80)
RDW: 12.9 % (ref 12.3–15.4)
RHEUMATOID FACTOR: 11.9 [IU]/mL (ref 0.0–13.9)
Sed Rate: 4 mm/hr (ref 0–30)
Uric Acid: 5.4 mg/dL (ref 3.7–8.6)
WBC: 8.6 10*3/uL (ref 3.4–10.8)

## 2014-03-11 LAB — THYROID PANEL WITH TSH
Free Thyroxine Index: 2 (ref 1.2–4.9)
T3 UPTAKE RATIO: 27 % (ref 24–39)
T4, Total: 7.5 ug/dL (ref 4.5–12.0)
TSH: 1.83 u[IU]/mL (ref 0.450–4.500)

## 2014-03-14 ENCOUNTER — Telehealth: Payer: Self-pay | Admitting: Family

## 2014-03-14 DIAGNOSIS — M1990 Unspecified osteoarthritis, unspecified site: Secondary | ICD-10-CM

## 2014-03-14 DIAGNOSIS — M25552 Pain in left hip: Secondary | ICD-10-CM

## 2014-03-14 MED ORDER — HYDROCODONE-ACETAMINOPHEN 10-325 MG PO TABS: 1.0000 | ORAL_TABLET | Freq: Three times a day (TID) | ORAL | Status: DC | PRN

## 2014-03-14 NOTE — Telephone Encounter (Signed)
Stp and he states the steroids aren't helping and he was taking hydrocodone and it didn't touch the pain. He is requesting morphine pills. Please advise.

## 2014-03-14 NOTE — Telephone Encounter (Signed)
Pt aware rx ready for pick up. 

## 2014-03-14 NOTE — Telephone Encounter (Signed)
Referral to ortho sent. Norco 10-325 mg written and ready for pick up

## 2014-03-14 NOTE — Telephone Encounter (Signed)
Aware, rx for hydrocodone ready. 

## 2014-04-21 DIAGNOSIS — M7062 Trochanteric bursitis, left hip: Secondary | ICD-10-CM | POA: Diagnosis not present

## 2014-04-21 DIAGNOSIS — M1612 Unilateral primary osteoarthritis, left hip: Secondary | ICD-10-CM | POA: Diagnosis not present

## 2014-05-09 DIAGNOSIS — M1612 Unilateral primary osteoarthritis, left hip: Secondary | ICD-10-CM | POA: Diagnosis not present

## 2014-05-15 ENCOUNTER — Encounter: Payer: Self-pay | Admitting: Family

## 2014-05-15 ENCOUNTER — Ambulatory Visit (INDEPENDENT_AMBULATORY_CARE_PROVIDER_SITE_OTHER): Payer: Medicare Other | Admitting: Family

## 2014-05-15 VITALS — BP 154/99 | HR 72 | Temp 97.2°F | Ht 68.0 in | Wt 200.6 lb

## 2014-05-15 DIAGNOSIS — K219 Gastro-esophageal reflux disease without esophagitis: Secondary | ICD-10-CM | POA: Diagnosis not present

## 2014-05-15 DIAGNOSIS — E785 Hyperlipidemia, unspecified: Secondary | ICD-10-CM | POA: Diagnosis not present

## 2014-05-15 DIAGNOSIS — M1990 Unspecified osteoarthritis, unspecified site: Secondary | ICD-10-CM

## 2014-05-15 DIAGNOSIS — H8110 Benign paroxysmal vertigo, unspecified ear: Secondary | ICD-10-CM | POA: Diagnosis not present

## 2014-05-15 DIAGNOSIS — R5383 Other fatigue: Secondary | ICD-10-CM | POA: Diagnosis not present

## 2014-05-15 DIAGNOSIS — I10 Essential (primary) hypertension: Secondary | ICD-10-CM | POA: Diagnosis not present

## 2014-05-15 MED ORDER — MECLIZINE HCL 25 MG PO TABS: 25.0000 mg | ORAL_TABLET | Freq: Two times a day (BID) | ORAL | Status: DC

## 2014-05-15 MED ORDER — AMLODIPINE BESYLATE 10 MG PO TABS: 10.0000 mg | ORAL_TABLET | Freq: Every day | ORAL | Status: DC

## 2014-05-15 MED ORDER — OMEPRAZOLE 20 MG PO CPDR: 20.0000 mg | DELAYED_RELEASE_CAPSULE | Freq: Every day | ORAL | Status: DC

## 2014-05-15 NOTE — Progress Notes (Signed)
Subjective:    Patient ID: Jacob Sanders, male    DOB: 1945-11-17, 69 y.o.   MRN: [id number]  Pt presents to the office today for chronic follow up. PT states his wife checks his BP everyday at home and states it is "always good at home". Pt states he does not know the "numbers because his wife checks it".  Hypertension This is a chronic problem. The current episode started more than 1 year ago. The problem has been waxing and waning since onset. The problem is uncontrolled. Pertinent negatives include no blurred vision, chest pain, headaches, palpitations, peripheral edema or shortness of breath. Risk factors for coronary artery disease include male gender, family history and smoking/tobacco exposure. Past treatments include calcium channel blockers. The current treatment provides mild improvement. There is no history of kidney disease, CAD/MI, CVA, heart failure or a thyroid problem. There is no history of sleep apnea.  Arthritis Presents for follow-up visit. The disease course has been fluctuating. He complains of pain. Affected locations include the left hip (Spine). Associated symptoms include fatigue. His past medical history is significant for chronic back pain and osteoarthritis. Past treatments include rest, OTC med and corticosteroids. The treatment provided moderate relief.  Gastrophageal Reflux He reports no chest pain, no coughing or no heartburn. This is a chronic problem. The current episode started more than 1 year ago. The problem occurs rarely. The problem has been resolved. The symptoms are aggravated by lying down and certain foods. Associated symptoms include fatigue. Pertinent negatives include no muscle weakness. He has tried a PPI for the symptoms. The treatment provided significant relief.      Review of Systems  Constitutional: Positive for fatigue.  HENT: Negative.   Eyes: Negative for blurred vision.  Respiratory: Negative.  Negative for cough and shortness of  breath.   Cardiovascular: Negative.  Negative for chest pain and palpitations.  Gastrointestinal: Negative.  Negative for heartburn.  Endocrine: Negative.   Genitourinary: Negative.   Musculoskeletal: Positive for arthritis. Negative for muscle weakness.  Neurological: Negative.  Negative for headaches.  Hematological: Negative.   Psychiatric/Behavioral: Negative.   All other systems reviewed and are negative.      Objective:   Physical Exam  Constitutional: He is oriented to person, place, and time. He appears well-developed and well-nourished. No distress.  HENT:  Head: Normocephalic.  Right Ear: External ear normal.  Left Ear: External ear normal.  Nose: Nose normal.  Mouth/Throat: Oropharynx is clear and moist.  Eyes: Pupils are equal, round, and reactive to light. Right eye exhibits no discharge. Left eye exhibits no discharge.  Neck: Normal range of motion. Neck supple. No thyromegaly present.  Cardiovascular: Normal rate, regular rhythm, normal heart sounds and intact distal pulses.   No murmur heard. Pulmonary/Chest: Effort normal and breath sounds normal. No respiratory distress. He has no wheezes.  Abdominal: Soft. Bowel sounds are normal. He exhibits no distension. There is no tenderness.  Musculoskeletal: Normal range of motion. He exhibits no edema or tenderness.  Neurological: He is alert and oriented to person, place, and time. He has normal reflexes. No cranial nerve deficit.  Skin: Skin is warm and dry. No rash noted. No erythema.  Psychiatric: He has a normal mood and affect. His behavior is normal. Judgment and thought content normal.  Vitals reviewed.     BP 154/99 mmHg  Pulse 72  Temp(Src) 97.2 F (36.2 C) (Oral)  Ht 5' 8"  (1.727 m)  Wt 200 lb 9.6 oz (90.992 kg)  BMI 30.51 kg/m2     Assessment & Plan:  1. Essential hypertension, benign _Pt does not want to change medications at this time Encouaged low salt diet and exercise - amLODipine (NORVASC)  10 MG tablet; Take 1 tablet (10 mg total) by mouth daily.  Dispense: 90 tablet; Refill: 3 - CMP14+EGFR  2. Arthritis - CMP14+EGFR  3. Hyperlipemia - CMP14+EGFR  4. Gastroesophageal reflux disease, esophagitis presence not specified - omeprazole (PRILOSEC) 20 MG capsule; Take 1 capsule (20 mg total) by mouth daily. prn  Dispense: 90 capsule; Refill: 3 - CMP14+EGFR  5. Other fatigue - CMP14+EGFR - Vit D  25 hydroxy (rtn osteoporosis monitoring) - Hepatitis panel, acute  6. Benign paroxysmal positional vertigo, unspecified laterality - meclizine (ANTIVERT) 25 MG tablet; Take 1 tablet (25 mg total) by mouth 2 (two) times daily with a meal. prn  Dispense: 30 tablet; Refill: 4 - CMP14+EGFR   Continue all meds Labs pending Health Maintenance reviewed Diet and exercise encouraged RTO 6 months  Evelina Dun, FNP

## 2014-05-15 NOTE — Patient Instructions (Signed)
Hepatitis C Hepatitis C is a viral infection of the liver. Infection may go undetected for months or years because symptoms may be absent or very mild. Chronic liver disease is the main danger of hepatitis C. This may lead to scarring of the liver (cirrhosis), liver failure, and liver cancer. CAUSES  Hepatitis C is caused by the hepatitis C virus (HCV). Formerly, hepatitis C infections were most commonly transmitted through blood transfusions. In the early 1990s, routine testing of donated blood for hepatitis C and exclusion of blood that tests positive for HCV began. Now, HCV is most commonly transmitted from person to person through injection drug use, sharing needles, or sex with an infected person. A caregiver may also get the infection from exposure to the blood of an infected patient by way of a cut or needle stick.  SYMPTOMS  Acute Phase Many cases of acute HCV infection are mild and cause few problems.Some people may not even realize they are sick.Symptoms in others may last a few weeks to several months and include:  Feeling very tired.  Loss of appetite.  Nausea.  Vomiting.  Abdominal pain.  Dark yellow urine.  Yellow skin and eyes (jaundice).  Itching of the skin. Chronic Phase  Between 50% to 85% of people who get HCV infection become "chronic carriers." They often have no symptoms, but the virus stays in their body.They may spread the virus to others and can get long-term liver disease.  Many people with chronic HCV infection remain healthy for many years. However, up to 1 in 5 chronically infected people may develop severe liver diseases including scarring of the liver (cirrhosis), liver failure, or liver cancer. DIAGNOSIS  Diagnosis of hepatitis C infection is made by testing blood for the presence of hepatitis C viral particles called RNA. Other tests may also be done to measure the status of current liver function, exclude other liver problems, or assess liver  damage. TREATMENT  Treatment with many antiviral drugs is available and recommended for some patients with chronic HCV infection. Drug treatment is generally considered appropriate for patients who:  Are 65 years of age or older.  Have a positive test for HCV particles in the blood.  Have a liver tissue sample (biopsy) that shows chronic hepatitis and significant scarring (fibrosis).  Do not have signs of liver failure.  Have acceptable blood test results that confirm the wellness of other body organs.  Are willing to be treated and conform to treatment requirements.  Have no other circumstances that would prevent treatment from being recommended (contraindications). All people who are offered and choose to receive drug treatment must understand that careful medical follow up for many months and even years is crucial in order to make successful care possible. The goal of drug treatment is to eliminate any evidence of HCV in the blood on a long-term basis. This is called a "sustained virologic response" or SVR. Achieving a SVR is associated with a decrease in the chance of life-threatening liver problems, need for a liver transplant, liver cancer rates, and liver-related complications. Successful treatment currently requires taking treatment drugs for at least 24 weeks and up to 72 weeks. An injected drug (interferon) given weekly and an oral antiviral medicine taken daily are usually prescribed. Side effects from these drugs are common and some may be very serious. Your response to treatment must be carefully monitored by both you and your caregiver throughout the entire treatment period. PREVENTION There is no vaccine for hepatitis C. The only  way to prevent the disease is to reduce the risk of exposure to the virus.   Avoid sharing drug needles or personal items like toothbrushes, razors, and nail clippers with an infected person.  Healthcare workers need to avoid injuries and wear  appropriate protective equipment such as gloves, gowns, and face masks when performing invasive medical or nursing procedures. HOME CARE INSTRUCTIONS  To avoid making your liver disease worse:  Strictly avoid drinking alcohol.  Carefully review all new prescriptions of medicines with your caregiver. Ask your caregiver which drugs you should avoid. The following drugs are toxic to the liver, and your caregiver may tell you to avoid them:  Isoniazid.  Methyldopa.  Acetaminophen.  Anabolic steroids (muscle-building drugs).  Erythromycin.  Oral contraceptives (birth control pills).  Check with your caregiver to make sure medicine you are currently taking will not be harmful.  Periodic blood tests may be required. Follow your caregiver's advice about when you should have blood tests.  Avoid a sexual relationship until advised otherwise by your caregiver.  Avoid activities that could expose other people to your blood. Examples include sharing a toothbrush, nail clippers, razors, and needles.  Bed rest is not necessary, but it may make you feel better. Recovery time is not related to the amount of rest you receive.  This infection is contagious. Follow your caregiver's instructions in order to avoid spread of the infection. SEEK IMMEDIATE MEDICAL CARE IF:  You have increasing fatigue or weakness.  You have an oral temperature above 102 F (38.9 C), not controlled by medicine.  You develop loss of appetite, nausea, or vomiting.  You develop jaundice.  You develop easy bruising or bleeding.  You develop any severe problems as a result of your treatment. MAKE SURE YOU:   Understand these instructions.  Will watch your condition.  Will get help right away if you are not doing well or get worse. Document Released: 12/28/1999 Document Revised: 03/24/2011 Document Reviewed: 04/13/2013 [facility] Patient Information 2015 [place], [place]. This information is not intended to replace  advice given to you by your health care provider. Make sure you discuss any questions you have with your health care provider. Arthritis, Nonspecific Arthritis is inflammation of a joint. This usually means pain, redness, warmth or swelling are present. One or more joints may be involved. There are a number of types of arthritis. Your caregiver may not be able to tell what type of arthritis you have right away. CAUSES  The most common cause of arthritis is the wear and tear on the joint (osteoarthritis). This causes damage to the cartilage, which can break down over time. The knees, hips, back and neck are most often affected by this type of arthritis. Other types of arthritis and common causes of joint pain include:  Sprains and other injuries near the joint. Sometimes minor sprains and injuries cause pain and swelling that develop hours later.  Rheumatoid arthritis. This affects hands, feet and knees. It usually affects both sides of your body at the same time. It is often associated with chronic ailments, fever, weight loss and general weakness.  Crystal arthritis. Gout and pseudo gout can cause occasional acute severe pain, redness and swelling in the foot, ankle, or knee.  Infectious arthritis. Bacteria can get into a joint through a break in overlying skin. This can cause infection of the joint. Bacteria and viruses can also spread through the blood and affect your joints.  Drug, infectious and allergy reactions. Sometimes joints can become mildly  painful and slightly swollen with these types of illnesses. SYMPTOMS   Pain is the main symptom.  Your joint or joints can also be red, swollen and warm or hot to the touch.  You may have a fever with certain types of arthritis, or even feel overall ill.  The joint with arthritis will hurt with movement. Stiffness is present with some types of arthritis. DIAGNOSIS  Your caregiver will suspect arthritis based on your description of your  symptoms and on your exam. Testing may be needed to find the type of arthritis:  Blood and sometimes urine tests.  X-ray tests and sometimes CT or MRI scans.  Removal of fluid from the joint (arthrocentesis) is done to check for bacteria, crystals or other causes. Your caregiver (or a specialist) will numb the area over the joint with a local anesthetic, and use a needle to remove joint fluid for examination. This procedure is only minimally uncomfortable.  Even with these tests, your caregiver may not be able to tell what kind of arthritis you have. Consultation with a specialist (rheumatologist) may be helpful. TREATMENT  Your caregiver will discuss with you treatment specific to your type of arthritis. If the specific type cannot be determined, then the following general recommendations may apply. Treatment of severe joint pain includes:  Rest.  Elevation.  Anti-inflammatory medication (for example, ibuprofen) may be prescribed. Avoiding activities that cause increased pain.  Only take over-the-counter or prescription medicines for pain and discomfort as recommended by your caregiver.  Cold packs over an inflamed joint may be used for 10 to 15 minutes every hour. Hot packs sometimes feel better, but do not use overnight. Do not use hot packs if you are diabetic without your caregiver's permission.  A cortisone shot into arthritic joints may help reduce pain and swelling.  Any acute arthritis that gets worse over the next 1 to 2 days needs to be looked at to be sure there is no joint infection. Long-term arthritis treatment involves modifying activities and lifestyle to reduce joint stress jarring. This can include weight loss. Also, exercise is needed to nourish the joint cartilage and remove waste. This helps keep the muscles around the joint strong. HOME CARE INSTRUCTIONS   Do not take aspirin to relieve pain if gout is suspected. This elevates uric acid levels.  Only take  over-the-counter or prescription medicines for pain, discomfort or fever as directed by your caregiver.  Rest the joint as much as possible.  If your joint is swollen, keep it elevated.  Use crutches if the painful joint is in your leg.  Drinking plenty of fluids may help for certain types of arthritis.  Follow your caregiver's dietary instructions.  Try low-impact exercise such as:  Swimming.  Water aerobics.  Biking.  Walking.  Morning stiffness is often relieved by a warm shower.  Put your joints through regular range-of-motion. SEEK MEDICAL CARE IF:   You do not feel better in 24 hours or are getting worse.  You have side effects to medications, or are not getting better with treatment. SEEK IMMEDIATE MEDICAL CARE IF:   You have a fever.  You develop severe joint pain, swelling or redness.  Many joints are involved and become painful and swollen.  There is severe back pain and/or leg weakness.  You have loss of bowel or bladder control. Document Released: 02/07/2004 Document Revised: 03/24/2011 Document Reviewed: 02/23/2008 [facility] Patient Information 2015 [place], [place]. This information is not intended to replace advice given to you  by your health care provider. Make sure you discuss any questions you have with your health care provider.  

## 2014-05-16 LAB — HEPATITIS PANEL, ACUTE
HEP A IGM: NEGATIVE
HEP B S AG: NEGATIVE
Hep B C IgM: NEGATIVE
Hep C Virus Ab: <0.1 s/co ratio (ref 0.0–0.9)

## 2014-05-16 LAB — CMP14+EGFR
ALT: 12 IU/L (ref 0–44)
AST: 8 IU/L (ref 0–40)
Albumin/Globulin Ratio: 1.8 (ref 1.1–2.5)
Albumin: 4.4 g/dL (ref 3.6–4.8)
Alkaline Phosphatase: 88 IU/L (ref 39–117)
BUN/Creatinine Ratio: 24 — ABNORMAL HIGH (ref 10–22)
BUN: 19 mg/dL (ref 8–27)
Bilirubin Total: 0.6 mg/dL (ref 0.0–1.2)
CHLORIDE: 101 mmol/L (ref 97–108)
CO2: 25 mmol/L (ref 18–29)
Calcium: 9.3 mg/dL (ref 8.6–10.2)
Creatinine, Ser: 0.79 mg/dL (ref 0.76–1.27)
GFR calc Af Amer: 108 mL/min/{1.73_m2} (ref >59)
GFR, EST NON AFRICAN AMERICAN: 94 mL/min/{1.73_m2} (ref >59)
GLUCOSE: 101 mg/dL — AB (ref 65–99)
Globulin, Total: 2.5 g/dL (ref 1.5–4.5)
Potassium: 4 mmol/L (ref 3.5–5.2)
Sodium: 144 mmol/L (ref 134–144)
TOTAL PROTEIN: 6.9 g/dL (ref 6.0–8.5)

## 2014-05-16 LAB — VITAMIN D 25 HYDROXY (VIT D DEFICIENCY, FRACTURES): Vit D, 25-Hydroxy: 29 ng/mL — ABNORMAL LOW (ref 30.0–100.0)

## 2014-05-17 ENCOUNTER — Telehealth: Payer: Self-pay | Admitting: *Deleted

## 2014-05-17 ENCOUNTER — Other Ambulatory Visit: Payer: Self-pay | Admitting: Family

## 2014-05-17 DIAGNOSIS — E559 Vitamin D deficiency, unspecified: Secondary | ICD-10-CM

## 2014-05-17 MED ORDER — VITAMIN D (ERGOCALCIFEROL) 1.25 MG (50000 UNIT) PO CAPS: 50000.0000 [IU] | ORAL_CAPSULE | ORAL | Status: DC

## 2014-05-17 NOTE — Telephone Encounter (Signed)
Tried to call pt regarding test results. No answer and no voicemail set up.

## 2014-05-17 NOTE — Telephone Encounter (Signed)
-----   Message from Junie Spencerhristy A Hawks, FNP sent at 05/17/2014 10:40 AM EDT ----- Kidney and liver function stable Vit D levels low- Prescription sent to pharmacy  Hepatitis panel (Hep A, B, & C) negative

## 2014-05-17 NOTE — Progress Notes (Signed)
Patient aware. Will pick up rx °

## 2014-05-31 ENCOUNTER — Encounter: Payer: Self-pay | Admitting: Family

## 2014-05-31 ENCOUNTER — Ambulatory Visit (INDEPENDENT_AMBULATORY_CARE_PROVIDER_SITE_OTHER): Payer: Medicare Other | Admitting: Family

## 2014-05-31 VITALS — BP 140/90 | HR 78 | Temp 97.0°F | Ht 68.0 in | Wt 202.4 lb

## 2014-05-31 DIAGNOSIS — R300 Dysuria: Secondary | ICD-10-CM

## 2014-05-31 DIAGNOSIS — J019 Acute sinusitis, unspecified: Secondary | ICD-10-CM | POA: Diagnosis not present

## 2014-05-31 LAB — POCT URINALYSIS DIPSTICK
GLUCOSE UA: NEGATIVE
Leukocytes, UA: NEGATIVE
Nitrite, UA: NEGATIVE
RBC UA: NEGATIVE
SPEC GRAV UA: 1.025
UROBILINOGEN UA: NEGATIVE
pH, UA: 6

## 2014-05-31 LAB — POCT UA - MICROSCOPIC ONLY
Bacteria, U Microscopic: NEGATIVE
Casts, Ur, LPF, POC: NEGATIVE
Crystals, Ur, HPF, POC: NEGATIVE
EPITHELIAL CELLS, URINE PER MICROSCOPY: NEGATIVE
RBC, urine, microscopic: NEGATIVE
YEAST UA: NEGATIVE

## 2014-05-31 MED ORDER — AMOXICILLIN-POT CLAVULANATE 875-125 MG PO TABS: 1.0000 | ORAL_TABLET | Freq: Two times a day (BID) | ORAL | Status: DC

## 2014-05-31 MED ORDER — HYDROCODONE-HOMATROPINE 5-1.5 MG/5ML PO SYRP: 5.0000 mL | ORAL_SOLUTION | Freq: Three times a day (TID) | ORAL | Status: DC | PRN

## 2014-05-31 MED ORDER — BENZONATATE 200 MG PO CAPS: 200.0000 mg | ORAL_CAPSULE | Freq: Three times a day (TID) | ORAL | Status: DC | PRN

## 2014-05-31 MED ORDER — FLUTICASONE PROPIONATE 50 MCG/ACT NA SUSP: 2.0000 | Freq: Every day | NASAL | Status: DC

## 2014-05-31 NOTE — Patient Instructions (Addendum)
Sinusitis Sinusitis is redness, soreness, and inflammation of the paranasal sinuses. Paranasal sinuses are air pockets within the bones of your face (beneath the eyes, the middle of the forehead, or above the eyes). In healthy paranasal sinuses, mucus is able to drain out, and air is able to circulate through them by way of your nose. However, when your paranasal sinuses are inflamed, mucus and air can become trapped. This can allow bacteria and other germs to grow and cause infection. Sinusitis can develop quickly and last only a short time (acute) or continue over a long period (chronic). Sinusitis that lasts for more than 12 weeks is considered chronic.  CAUSES  Causes of sinusitis include:  Allergies.  Structural abnormalities, such as displacement of the cartilage that separates your nostrils (deviated septum), which can decrease the air flow through your nose and sinuses and affect sinus drainage.  Functional abnormalities, such as when the small hairs (cilia) that line your sinuses and help remove mucus do not work properly or are not present. SIGNS AND SYMPTOMS  Symptoms of acute and chronic sinusitis are the same. The primary symptoms are pain and pressure around the affected sinuses. Other symptoms include:  Upper toothache.  Earache.  Headache.  Bad breath.  Decreased sense of smell and taste.  A cough, which worsens when you are lying flat.  Fatigue.  Fever.  Thick drainage from your nose, which often is green and may contain pus (purulent).  Swelling and warmth over the affected sinuses. DIAGNOSIS  Your health care provider will perform a physical exam. During the exam, your health care provider may:  Look in your nose for signs of abnormal growths in your nostrils (nasal polyps).  Tap over the affected sinus to check for signs of infection.  View the inside of your sinuses (endoscopy) using an imaging device that has a light attached (endoscope). If your health  care provider suspects that you have chronic sinusitis, one or more of the following tests may be recommended:  Allergy tests.  Nasal culture. A sample of mucus is taken from your nose, sent to a lab, and screened for bacteria.  Nasal cytology. A sample of mucus is taken from your nose and examined by your health care provider to determine if your sinusitis is related to an allergy. TREATMENT  Most cases of acute sinusitis are related to a viral infection and will resolve on their own within 10 days. Sometimes medicines are prescribed to help relieve symptoms (pain medicine, decongestants, nasal steroid sprays, or saline sprays).  However, for sinusitis related to a bacterial infection, your health care provider will prescribe antibiotic medicines. These are medicines that will help kill the bacteria causing the infection.  Rarely, sinusitis is caused by a fungal infection. In theses cases, your health care provider will prescribe antifungal medicine. For some cases of chronic sinusitis, surgery is needed. Generally, these are cases in which sinusitis recurs more than 3 times per year, despite other treatments. HOME CARE INSTRUCTIONS   Drink plenty of water. Water helps thin the mucus so your sinuses can drain more easily.  Use a humidifier.  Inhale steam 3 to 4 times a day (for example, sit in the bathroom with the shower running).  Apply a warm, moist washcloth to your face 3 to 4 times a day, or as directed by your health care provider.  Use saline nasal sprays to help moisten and clean your sinuses.  Take medicines only as directed by your health care provider.    If you were prescribed either an antibiotic or antifungal medicine, finish it all even if you start to feel better. SEEK IMMEDIATE MEDICAL CARE IF:  You have increasing pain or severe headaches.  You have nausea, vomiting, or drowsiness.  You have swelling around your face.  You have vision problems.  You have a stiff  neck.  You have difficulty breathing. MAKE SURE YOU:   Understand these instructions.  Will watch your condition.  Will get help right away if you are not doing well or get worse. Document Released: 12/30/2004 Document Revised: 05/16/2013 Document Reviewed: 01/14/2011 [employer] Patient Information 2015 [employer]. This information is not intended to replace advice given to you by your health care provider. Make sure you discuss any questions you have with your health care provider.  - Take meds as prescribed - Use a cool mist humidifier  -Use saline nose sprays frequently -Saline irrigations of the nose can be very helpful if done frequently.  * 4X daily for 1 week*  * Use of a nettie pot can be helpful with this. Follow directions with this* -Force fluids -For any cough or congestion  Use plain Mucinex- regular strength or max strength is fine   * Children- consult with Pharmacist for dosing -For fever or aces or pains- take tylenol or ibuprofen appropriate for age and weight.  * for fevers greater than 101 orally you may alternate ibuprofen and tylenol every  3 hours. -Throat lozenges if help   Jacob Cisse, FNP  

## 2014-05-31 NOTE — Progress Notes (Signed)
Subjective:    Patient ID: Jacob Sanders, male    DOB: 12-04-1945, 69 y.o.   MRN: [id number]  Sinus Problem This is a new problem. The current episode started 1 to 4 weeks ago. The problem is unchanged. There has been no fever. His pain is at a severity of 4/10. Associated symptoms include congestion, coughing, ear pain, headaches, shortness of breath, sinus pressure and sneezing. Pertinent negatives include no hoarse voice or sore throat. Past treatments include spray decongestants, saline sprays and lying down. The treatment provided mild relief.  Dysuria  This is a new problem. The current episode started in the past 7 days. The problem occurs intermittently. The problem has been waxing and waning. The quality of the pain is described as burning. There has been no fever. Associated symptoms include hesitancy and urgency. Pertinent negatives include no discharge, frequency, hematuria, nausea or vomiting. He has tried increased fluids for the symptoms. The treatment provided mild relief.  Cough Associated symptoms include ear pain, headaches and shortness of breath. Pertinent negatives include no sore throat.      Review of Systems  Constitutional: Negative.   HENT: Positive for congestion, ear pain, sinus pressure and sneezing. Negative for hoarse voice and sore throat.   Respiratory: Positive for cough and shortness of breath.   Cardiovascular: Negative.   Gastrointestinal: Negative.  Negative for nausea and vomiting.  Endocrine: Negative.   Genitourinary: Positive for dysuria, hesitancy and urgency. Negative for frequency and hematuria.  Musculoskeletal: Negative.   Neurological: Positive for headaches.  Hematological: Negative.   Psychiatric/Behavioral: Negative.   All other systems reviewed and are negative.      Objective:   Physical Exam  Constitutional: He is oriented to person, place, and time. He appears well-developed and well-nourished. No distress.  HENT:  Head:  Normocephalic.  Right Ear: External ear normal.  Left Ear: External ear normal.  Nose: Right sinus exhibits maxillary sinus tenderness and frontal sinus tenderness. Left sinus exhibits maxillary sinus tenderness and frontal sinus tenderness.  Nasal passage erythemas with mild swelling  Oropharynx erythemas   Eyes: Pupils are equal, round, and reactive to light. Right eye exhibits no discharge. Left eye exhibits no discharge.  Neck: Normal range of motion. Neck supple. No thyromegaly present.  Cardiovascular: Normal rate, regular rhythm, normal heart sounds and intact distal pulses.   No murmur heard. Pulmonary/Chest: Effort normal and breath sounds normal. No respiratory distress. He has no wheezes.  Abdominal: Soft. Bowel sounds are normal. He exhibits no distension. There is no tenderness.  Musculoskeletal: Normal range of motion. He exhibits no edema or tenderness.  Negative for CVA tenderness   Neurological: He is alert and oriented to person, place, and time. He has normal reflexes. No cranial nerve deficit.  Skin: Skin is warm and dry. No rash noted. No erythema.  Psychiatric: He has a normal mood and affect. His behavior is normal. Judgment and thought content normal.  Vitals reviewed.     BP 140/90 mmHg  Pulse 78  Temp(Src) 97 F (36.1 C) (Oral)  Ht 5\' 8"  (1.727 m)  Wt 202 lb 6.4 oz (91.808 kg)  BMI 30.78 kg/m2     Assessment & Plan:  1. Dysuria - POCT UA - Microscopic Only - POCT urinalysis dipstick  2. Acute sinusitis, recurrence not specified, unspecified location - Take meds as prescribed - Use a cool mist humidifier  -Use saline nose sprays frequently -Saline irrigations of the nose can be very helpful if done frequently.  *  4X daily for 1 week*  * Use of a nettie pot can be helpful with this. Follow directions with this* -Force fluids -For any cough or congestion  Use plain Mucinex- regular strength or max strength is fine   * Children- consult with  Pharmacist for dosing -For fever or aces or pains- take tylenol or ibuprofen appropriate for age and weight.  * for fevers greater than 101 orally you may alternate ibuprofen and tylenol every  3 hours. -Throat lozenges if help - HYDROcodone-homatropine (HYCODAN) 5-1.5 MG/5ML syrup; Take 5 mLs by mouth every 8 (eight) hours as needed for cough.  Dispense: 120 mL; Refill: 0 - benzonatate (TESSALON) 200 MG capsule; Take 1 capsule (200 mg total) by mouth 3 (three) times daily as needed.  Dispense: 30 capsule; Refill: 1 - amoxicillin-clavulanate (AUGMENTIN) 875-125 MG per tablet; Take 1 tablet by mouth 2 (two) times daily.  Dispense: 14 tablet; Refill: 0  Jannifer Rodneyhristy Hawks, FNP

## 2014-07-05 IMAGING — CR DG KNEE STANDING AP BILAT
3 series · 3 of 3 positions shown · non-contrast
Comparison: No recent prior.

CLINICAL DATA: Bilateral knee pain.  No known injury.

EXAM:
BILATERAL KNEES STANDING - 1 VIEW

[view not recorded (1 of 3)]
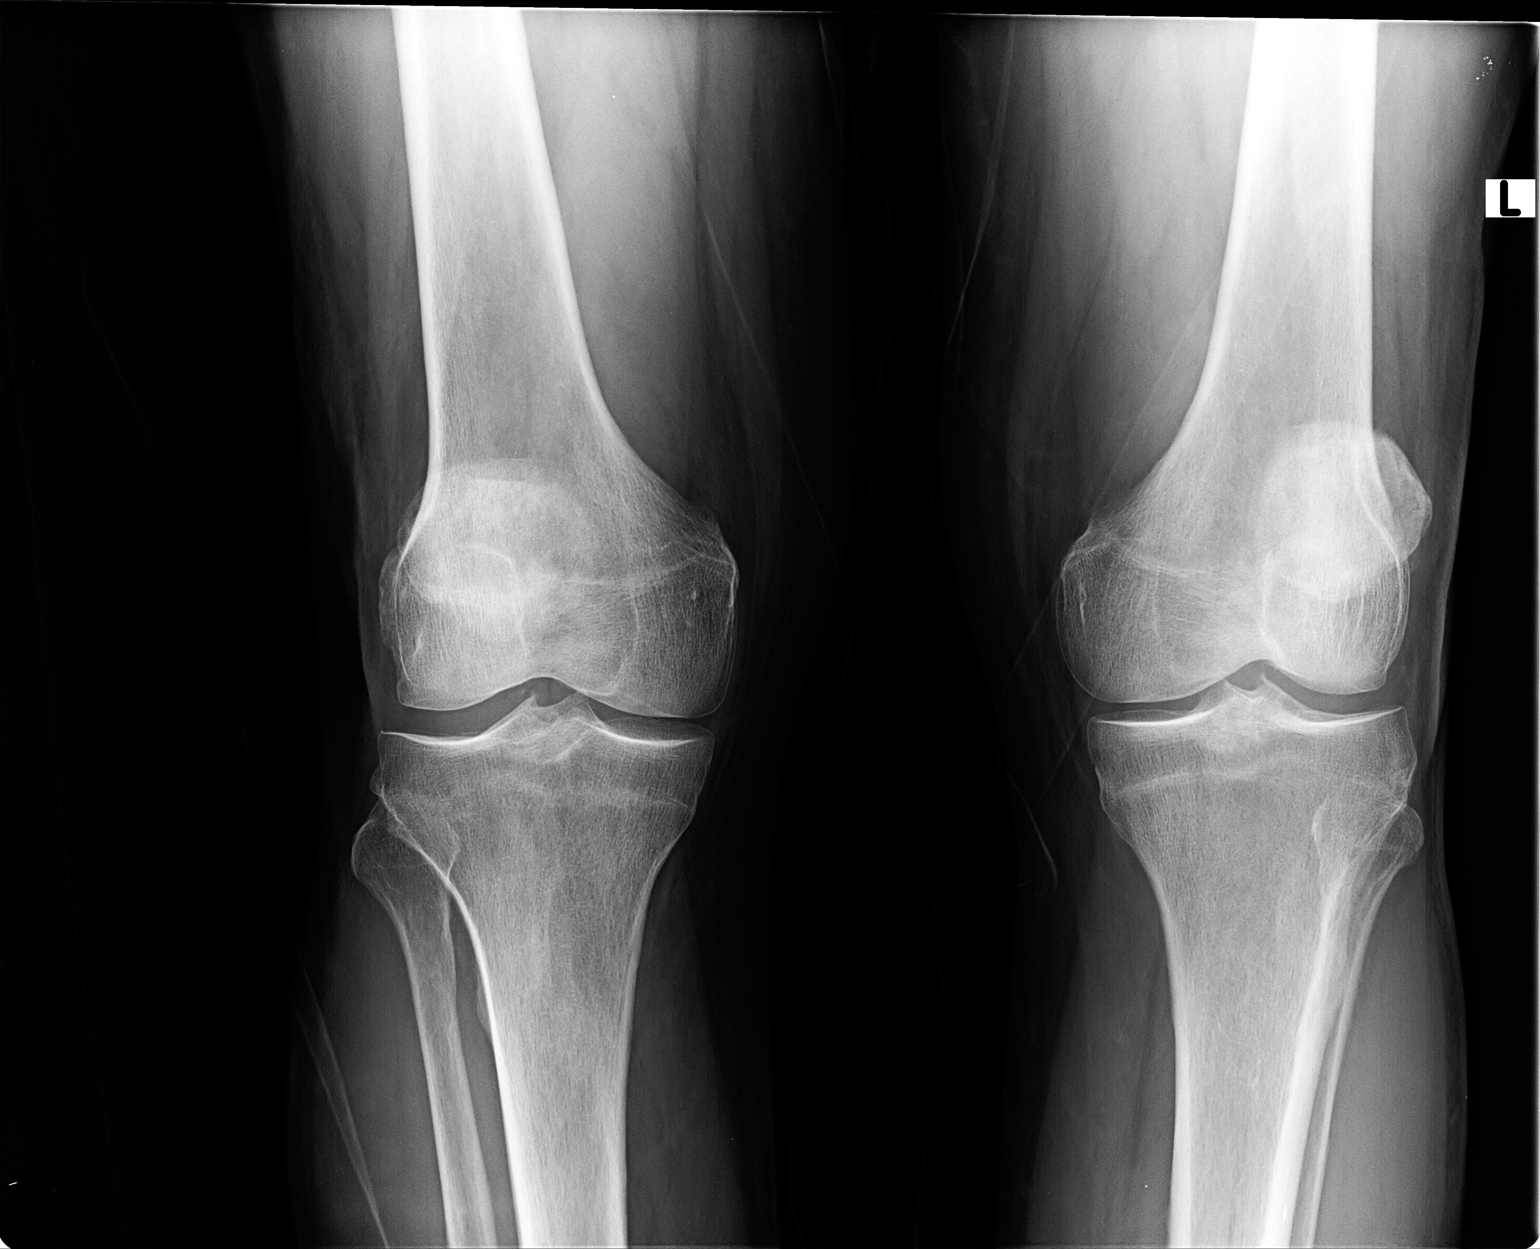

[view not recorded (2 of 3)]
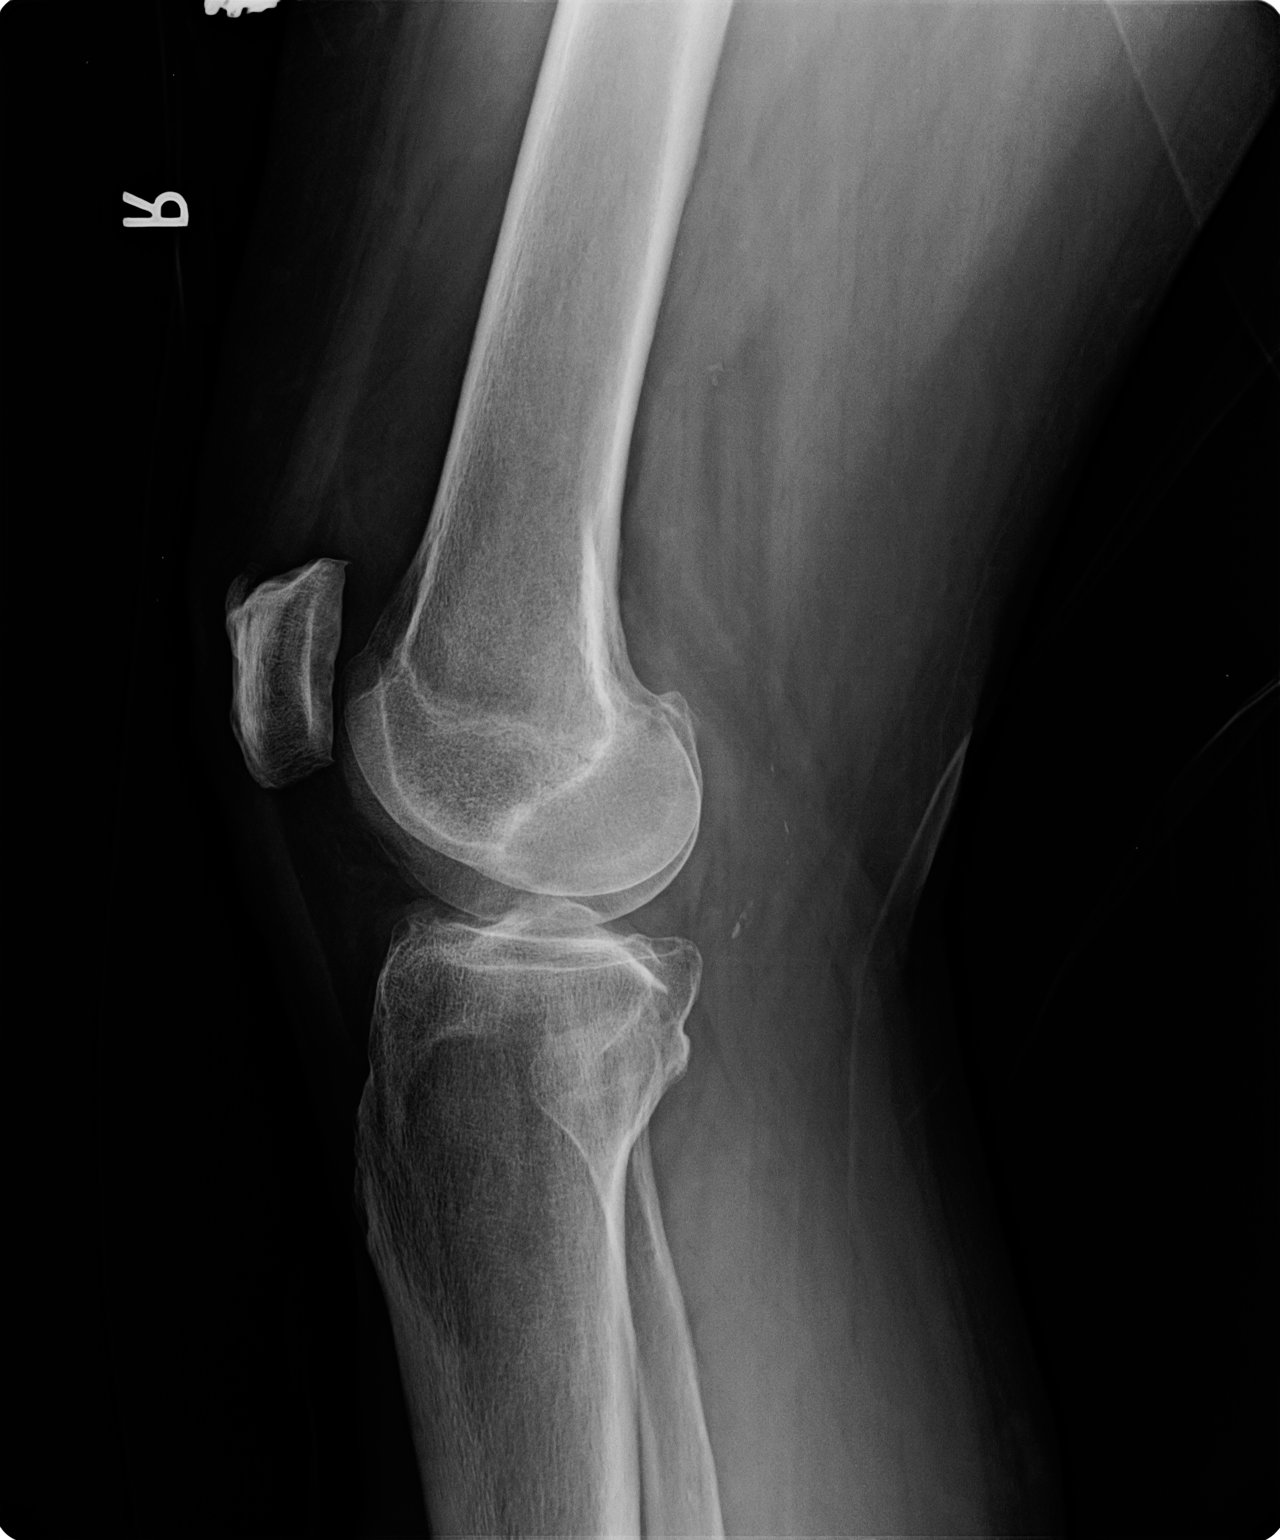

[view not recorded (3 of 3)]
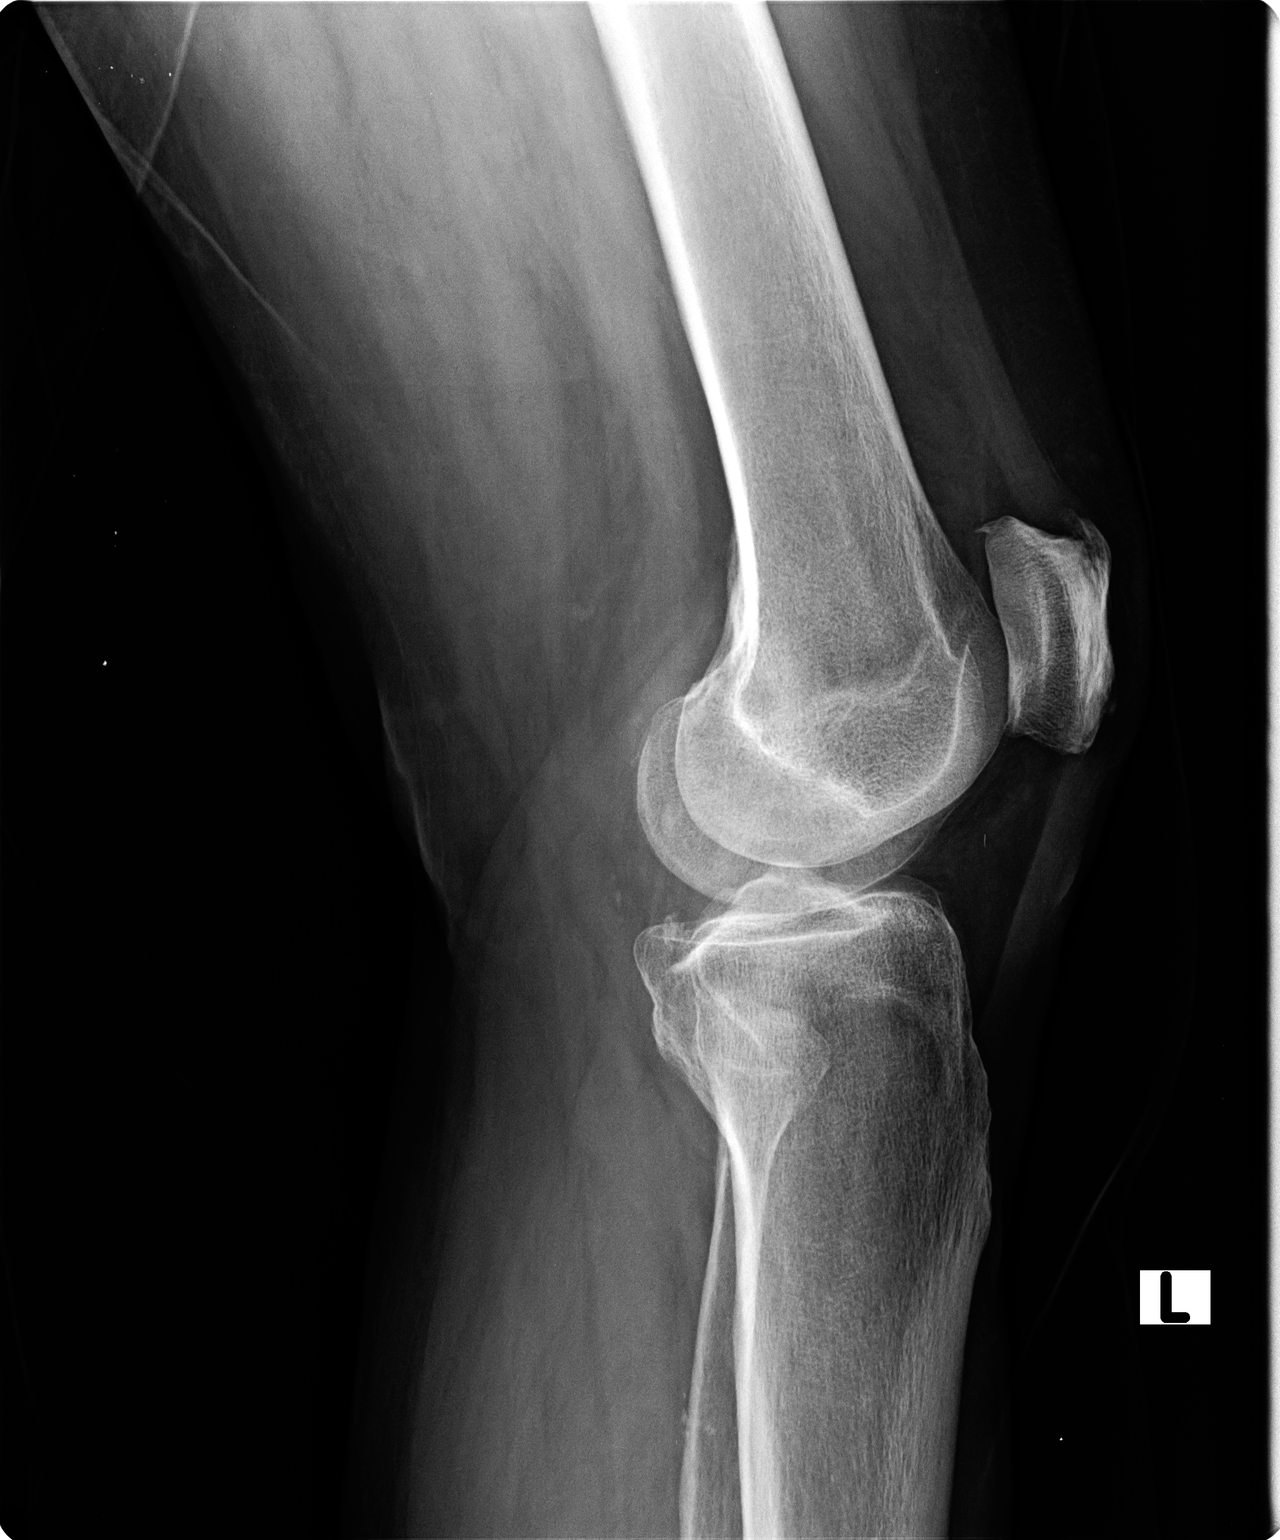

[3 of 3 positions shown; findings below may reference images not displayed]

FINDINGS: Mild tricompartment degenerative change. No evidence of fracture or
dislocation. Tiny loose body noted right knee joint . Bilateral
popliteal artery atherosclerotic vascular calcification.
IMPRESSION: 1. No acute bony abnormality. Tiny loose body noted a right knee
joint.

2. Bilateral popliteal artery atherosclerotic vascular
calcification.

## 2014-07-10 DIAGNOSIS — M7062 Trochanteric bursitis, left hip: Secondary | ICD-10-CM | POA: Diagnosis not present

## 2014-07-10 DIAGNOSIS — M1612 Unilateral primary osteoarthritis, left hip: Secondary | ICD-10-CM | POA: Diagnosis not present

## 2014-07-13 ENCOUNTER — Other Ambulatory Visit: Payer: Self-pay

## 2014-07-13 MED ORDER — MELOXICAM 15 MG PO TABS: 15.0000 mg | ORAL_TABLET | Freq: Every day | ORAL | Status: DC

## 2014-08-16 ENCOUNTER — Other Ambulatory Visit: Payer: Self-pay | Admitting: *Deleted

## 2014-08-16 DIAGNOSIS — I10 Essential (primary) hypertension: Secondary | ICD-10-CM

## 2014-08-16 MED ORDER — AMLODIPINE BESYLATE 10 MG PO TABS
10.0000 mg | ORAL_TABLET | Freq: Every day | ORAL | Status: DC
Start: 2014-08-16 — End: 2014-09-21

## 2014-08-22 ENCOUNTER — Encounter: Payer: Self-pay | Admitting: Family

## 2014-08-22 ENCOUNTER — Ambulatory Visit (INDEPENDENT_AMBULATORY_CARE_PROVIDER_SITE_OTHER): Payer: Medicare Other | Admitting: Family

## 2014-08-22 VITALS — BP 156/104 | HR 70 | Temp 96.7°F | Ht 68.0 in | Wt 198.6 lb

## 2014-08-22 DIAGNOSIS — M1990 Unspecified osteoarthritis, unspecified site: Secondary | ICD-10-CM | POA: Diagnosis not present

## 2014-08-22 MED ORDER — HYDROCODONE-ACETAMINOPHEN 7.5-325 MG PO TABS: 1.0000 | ORAL_TABLET | Freq: Four times a day (QID) | ORAL | Status: DC | PRN

## 2014-08-22 NOTE — Progress Notes (Signed)
   Subjective:    Patient ID: Jacob Sanders, male    DOB: 1945-04-17, 69 y.o.   MRN: [id number]  HPI PT presents to the office today bilateral leg pain that started year ago. Pt states he feels as if the pain is in his muscle and bone. Pt has been told he has arthritis fibromyalgia. Pt has seen an ortho and was told his pain was coming from arthritis. PT states his pain is constant 6 out 10. Pt states he is very hesitant on starting on "any more pills". Pt states he monitors his BP at home and it is "always good" and does not want to change his medications at this time.    Review of Systems  Constitutional: Negative.   HENT: Negative.   Respiratory: Negative.   Cardiovascular: Negative.   Gastrointestinal: Negative.   Endocrine: Negative.   Genitourinary: Negative.   Musculoskeletal: Negative.   Neurological: Negative.   Hematological: Negative.   Psychiatric/Behavioral: Negative.   All other systems reviewed and are negative.      Objective:   Physical Exam  Constitutional: He is oriented to person, place, and time. He appears well-developed and well-nourished. No distress.  HENT:  Head: Normocephalic.  Right Ear: External ear normal.  Left Ear: External ear normal.  Nose: Nose normal.  Mouth/Throat: Oropharynx is clear and moist.  Eyes: Pupils are equal, round, and reactive to light. Right eye exhibits no discharge. Left eye exhibits no discharge.  Neck: Normal range of motion. Neck supple. No thyromegaly present.  Cardiovascular: Normal rate, regular rhythm, normal heart sounds and intact distal pulses.   No murmur heard. Pulmonary/Chest: Effort normal and breath sounds normal. No respiratory distress. He has no wheezes.  Abdominal: Soft. Bowel sounds are normal. He exhibits no distension. There is no tenderness.  Musculoskeletal: Normal range of motion. He exhibits no edema or tenderness.  Neurological: He is alert and oriented to person, place, and time. He has normal  reflexes. No cranial nerve deficit.  Skin: Skin is warm and dry. No rash noted. No erythema.  Psychiatric: He has a normal mood and affect. His behavior is normal. Judgment and thought content normal.  Vitals reviewed.     Blood pressure 160/97, pulse 70, temperature 96.7 F (35.9 C), temperature source Oral, height $RemoveBefo'5\' 8"'[id number]$  (1.727 m), weight 198 lb 9.6 oz (90.084 kg).     Assessment & Plan:  1. Arthritis -Encouraged weight loss -Exercise encouraged -Use Norco rx only as needed -RTO prn - HYDROcodone-acetaminophen (NORCO) 7.5-325 MG per tablet; Take 1 tablet by mouth every 6 (six) hours as needed for moderate pain.  Dispense: 60 tablet; Refill: 0 - CMP14+EGFR  *Pt does not want to change BP medications at this time. Evelina Dun, FNP

## 2014-08-22 NOTE — Patient Instructions (Signed)

## 2014-08-23 LAB — CMP14+EGFR
ALT: 22 IU/L (ref 0–44)
AST: 19 IU/L (ref 0–40)
Albumin/Globulin Ratio: 1.7 (ref 1.1–2.5)
Albumin: 4.5 g/dL (ref 3.6–4.8)
Alkaline Phosphatase: 141 IU/L — ABNORMAL HIGH (ref 39–117)
BILIRUBIN TOTAL: 0.5 mg/dL (ref 0.0–1.2)
BUN / CREAT RATIO: 15 (ref 10–22)
BUN: 13 mg/dL (ref 8–27)
CO2: 24 mmol/L (ref 18–29)
Calcium: 9.7 mg/dL (ref 8.6–10.2)
Chloride: 99 mmol/L (ref 97–108)
Creatinine, Ser: 0.87 mg/dL (ref 0.76–1.27)
GFR calc non Af Amer: 89 mL/min/{1.73_m2} (ref >59)
GFR, EST AFRICAN AMERICAN: 103 mL/min/{1.73_m2} (ref >59)
Globulin, Total: 2.6 g/dL (ref 1.5–4.5)
Glucose: 110 mg/dL — ABNORMAL HIGH (ref 65–99)
Potassium: 5 mmol/L (ref 3.5–5.2)
SODIUM: 139 mmol/L (ref 134–144)
Total Protein: 7.1 g/dL (ref 6.0–8.5)

## 2014-08-24 NOTE — Progress Notes (Signed)
Patient aware.

## 2014-09-21 ENCOUNTER — Other Ambulatory Visit: Payer: Self-pay

## 2014-09-21 DIAGNOSIS — K219 Gastro-esophageal reflux disease without esophagitis: Secondary | ICD-10-CM

## 2014-09-21 DIAGNOSIS — I10 Essential (primary) hypertension: Secondary | ICD-10-CM

## 2014-09-21 MED ORDER — MELOXICAM 15 MG PO TABS: 15.0000 mg | ORAL_TABLET | Freq: Every day | ORAL | Status: DC

## 2014-09-21 MED ORDER — AMLODIPINE BESYLATE 10 MG PO TABS: 10.0000 mg | ORAL_TABLET | Freq: Every day | ORAL | Status: DC

## 2014-09-21 MED ORDER — MECLIZINE HCL 32 MG PO TABS
32.0000 mg | ORAL_TABLET | Freq: Three times a day (TID) | ORAL | Status: DC | PRN
Start: 2014-09-21 — End: 2014-12-14

## 2014-09-21 MED ORDER — OMEPRAZOLE 20 MG PO CPDR: 20.0000 mg | DELAYED_RELEASE_CAPSULE | Freq: Every day | ORAL | Status: DC

## 2014-09-21 NOTE — Telephone Encounter (Signed)
Last seen 08/22/14 Jacob Sanders  This is for mail order  Meclizine was not on EPIC list of meds

## 2014-09-21 NOTE — Telephone Encounter (Signed)
.  .  .        xx

## 2014-11-09 ENCOUNTER — Other Ambulatory Visit: Payer: Self-pay | Admitting: Family

## 2014-11-13 ENCOUNTER — Other Ambulatory Visit: Payer: Self-pay | Admitting: Family

## 2014-12-11 ENCOUNTER — Telehealth: Payer: Self-pay | Admitting: Family

## 2014-12-12 ENCOUNTER — Telehealth: Payer: Self-pay | Admitting: Family

## 2014-12-13 NOTE — Telephone Encounter (Signed)
Please address

## 2014-12-14 ENCOUNTER — Ambulatory Visit (INDEPENDENT_AMBULATORY_CARE_PROVIDER_SITE_OTHER): Payer: Medicare Other | Admitting: Family

## 2014-12-14 ENCOUNTER — Encounter: Payer: Self-pay | Admitting: Family

## 2014-12-14 VITALS — BP 132/82 | HR 69 | Temp 97.4°F | Ht 68.0 in | Wt 200.6 lb

## 2014-12-14 DIAGNOSIS — Y92009 Unspecified place in unspecified non-institutional (private) residence as the place of occurrence of the external cause: Secondary | ICD-10-CM

## 2014-12-14 DIAGNOSIS — Z720 Tobacco use: Secondary | ICD-10-CM

## 2014-12-14 DIAGNOSIS — F17200 Nicotine dependence, unspecified, uncomplicated: Secondary | ICD-10-CM | POA: Insufficient documentation

## 2014-12-14 DIAGNOSIS — Y92099 Unspecified place in other non-institutional residence as the place of occurrence of the external cause: Secondary | ICD-10-CM | POA: Diagnosis not present

## 2014-12-14 DIAGNOSIS — W19XXXA Unspecified fall, initial encounter: Secondary | ICD-10-CM

## 2014-12-14 DIAGNOSIS — Z87891 Personal history of nicotine dependence: Secondary | ICD-10-CM | POA: Insufficient documentation

## 2014-12-14 DIAGNOSIS — R29898 Other symptoms and signs involving the musculoskeletal system: Secondary | ICD-10-CM | POA: Diagnosis not present

## 2014-12-14 DIAGNOSIS — I739 Peripheral vascular disease, unspecified: Secondary | ICD-10-CM

## 2014-12-14 NOTE — Patient Instructions (Signed)
Intermittent Claudication Intermittent claudication is pain in your leg that occurs when you walk or exercise and goes away when you rest. The pain can occur in one or both legs. CAUSES Intermittent claudication is caused by the buildup of plaque within the major arteries in the body (atherosclerosis). The plaque, which makes arteries stiff and narrow, prevents enough blood from reaching your leg muscles. The pain occurs when you walk or exercise because your muscles need more blood when you are moving and exercising. RISK FACTORS Risk factors include:  A family history of atherosclerosis.  A personal history of stroke or heart disease.  Older age.  Being inactive or overweight.  Smoking cigarettes.  Having another health condition such as:  Diabetes.  High blood pressure.  High cholesterol. SIGNS AND SYMPTOMS  Your hip or leg may:   Ache.  Cramp.  Feel tight.  Feel weak.  Feel heavy. Over time, you may feel pain in your calf, thigh, or hip. DIAGNOSIS  Your health care provider may diagnose intermittent claudication based on your symptoms and medical history. Your health care provider may also do tests to learn more about your condition. These may include:  Blood tests.  An ultrasound.  Imaging tests such as angiography, magnetic resonance angiography (MRA), and computed tomography angiography (CTA). TREATMENT You may be treated for problems such as:  High blood pressure.  High cholesterol.  Diabetes. Other treatments may include:  Lifestyle changes such as:  Starting an exercise program.  Losing weight.  Quitting smoking.  Medicines to help restore blood flow through your legs.  Blood vessel surgery (angioplasty) to restore blood flow if your intermittent claudication is caused by severe peripheral artery disease. HOME CARE INSTRUCTIONS  Manage any other health conditions you have.  Eat a diet low in saturated fats and calories to maintain a  healthy weight.  Quit smoking, if you smoke.  Take medicines only as directed by your health care provider.  If your health care provider recommended an exercise program for you, follow it as directed. Your exercise program may involve:  Walking three or more times a week.  Walking until you have certain symptoms of intermittent claudication.  Resting until symptoms go away.  Gradually increasing walking time to about 50 minutes a day. SEEK MEDICAL CARE IF: Your condition is not getting better or is getting worse. SEEK IMMEDIATE MEDICAL CARE IF:   You have chest pain.  You have difficulty breathing.  You develop arm weakness.  You have trouble speaking.  Your face begins to droop. MAKE SURE YOU:  Understand these instructions.  Will watch your condition.  Will get help if you are not doing well or get worse.   This information is not intended to replace advice given to you by your health care provider. Make sure you discuss any questions you have with your health care provider.   Document Released: 11/02/2003 Document Revised: 01/20/2014 Document Reviewed: 04/07/2013 Elsevier Interactive Patient Education 2016 [employer]  

## 2014-12-14 NOTE — Progress Notes (Signed)
   Subjective:    Patient ID: Jacob Sanders, male    DOB: 1945-05-27, 69 y.o.   MRN: [id number]  HPI Pt presents to the office today with complaints of bilateral leg weakness that started a few months ago. PT states he uses a cane at times to help him walk. Pt states he fell two days ago walking outside. PT states his legs "just gave out on him". Pt states he has intermittent bilateral aching pain of a 5-9 out 10 in his thighs and calves. Pt states the pain is worse when standing.   Pt denies any headache, palpitations, SOB, or edema at this time. Pt is a current smoker, but states he has cut back to 1/2 a pack a day. Pt states he was smoking 2-3 packs a day.     Review of Systems  Constitutional: Negative.   HENT: Negative.   Respiratory: Negative.   Cardiovascular: Negative.   Gastrointestinal: Negative.   Endocrine: Negative.   Genitourinary: Negative.   Musculoskeletal: Negative.   Neurological: Negative.   Hematological: Negative.   Psychiatric/Behavioral: Negative.   All other systems reviewed and are negative.      Objective:   Physical Exam  Constitutional: He is oriented to person, place, and time. He appears well-developed and well-nourished. No distress.  HENT:  Head: Normocephalic.  Eyes: Pupils are equal, round, and reactive to light. Right eye exhibits no discharge. Left eye exhibits no discharge.  Neck: Normal range of motion. Neck supple. No thyromegaly present.  Cardiovascular: Normal rate, regular rhythm, normal heart sounds and intact distal pulses.   No murmur heard. Pulmonary/Chest: Effort normal and breath sounds normal. No respiratory distress. He has no wheezes.  Abdominal: Soft. Bowel sounds are normal. He exhibits no distension. There is no tenderness.  Musculoskeletal: Normal range of motion. He exhibits no edema or tenderness.  Neurological: He is alert and oriented to person, place, and time. He has normal reflexes. No cranial nerve deficit.  Skin:  Skin is warm and dry. No rash noted. No erythema.  Psychiatric: He has a normal mood and affect. His behavior is normal. Judgment and thought content normal.  Vitals reviewed.     BP 152/85 mmHg  Pulse 67  Temp(Src) 97.4 F (36.3 C) (Oral)  Ht 5\' 8"  (1.727 m)  Wt 200 lb 9.6 oz (90.992 kg)  BMI 30.51 kg/m2     Assessment & Plan:  1. Intermittent claudication (HCC) -Smoking cessation  -Keep cholesterol and hypertension at goal - Ambulatory referral to Vascular Surgery  2. Weakness of both lower extremities - Ambulatory referral to Vascular Surgery  3. Fall at home, initial encounter -Falls precaution discussed - Ambulatory referral to Vascular Surgery  4. Current smoker -Smoking cessation discussed - Ambulatory referral to Vascular Surgery  Jannifer Rodneyhristy Monnie Anspach, FNP

## 2014-12-14 NOTE — Telephone Encounter (Signed)
Pt needs to be seen

## 2014-12-19 ENCOUNTER — Telehealth: Payer: Self-pay | Admitting: Family

## 2015-01-18 ENCOUNTER — Other Ambulatory Visit: Payer: Self-pay | Admitting: Family

## 2015-03-16 ENCOUNTER — Other Ambulatory Visit: Payer: Self-pay | Admitting: Family

## 2015-04-06 ENCOUNTER — Encounter: Payer: Self-pay | Admitting: Family

## 2015-04-06 ENCOUNTER — Ambulatory Visit (INDEPENDENT_AMBULATORY_CARE_PROVIDER_SITE_OTHER): Payer: Medicare Other | Admitting: Family

## 2015-04-06 VITALS — BP 135/88 | HR 68 | Temp 97.1°F | Ht 68.0 in | Wt 198.0 lb

## 2015-04-06 DIAGNOSIS — F17200 Nicotine dependence, unspecified, uncomplicated: Secondary | ICD-10-CM

## 2015-04-06 DIAGNOSIS — M1990 Unspecified osteoarthritis, unspecified site: Secondary | ICD-10-CM | POA: Diagnosis not present

## 2015-04-06 DIAGNOSIS — R5383 Other fatigue: Secondary | ICD-10-CM | POA: Diagnosis not present

## 2015-04-06 DIAGNOSIS — E559 Vitamin D deficiency, unspecified: Secondary | ICD-10-CM

## 2015-04-06 DIAGNOSIS — K219 Gastro-esophageal reflux disease without esophagitis: Secondary | ICD-10-CM

## 2015-04-06 DIAGNOSIS — E785 Hyperlipidemia, unspecified: Secondary | ICD-10-CM | POA: Diagnosis not present

## 2015-04-06 DIAGNOSIS — M109 Gout, unspecified: Secondary | ICD-10-CM

## 2015-04-06 DIAGNOSIS — Z1211 Encounter for screening for malignant neoplasm of colon: Secondary | ICD-10-CM

## 2015-04-06 DIAGNOSIS — Z720 Tobacco use: Secondary | ICD-10-CM

## 2015-04-06 DIAGNOSIS — I10 Essential (primary) hypertension: Secondary | ICD-10-CM | POA: Diagnosis not present

## 2015-04-06 MED ORDER — CELECOXIB 100 MG PO CAPS: 100.0000 mg | ORAL_CAPSULE | Freq: Two times a day (BID) | ORAL | Status: DC

## 2015-04-06 NOTE — Patient Instructions (Signed)

## 2015-04-06 NOTE — Progress Notes (Signed)
Subjective:    Patient ID: Jacob Sanders, male    DOB: 1945-11-12, 70 y.o.   MRN: [id number]  Pt presents to the office today for chronic follow up. Pt is followed by the [facility] and patient was told he needed a bone density scan today. Pt also states he had bilateral knee x-ray that showed arthritis. Pt states he is having constant thigh pain, knee pain, and  Hypertension This is a chronic problem. The current episode started more than 1 year ago. The problem has been resolved since onset. The problem is controlled. Pertinent negatives include no blurred vision, chest pain, headaches, palpitations, peripheral edema or shortness of breath. Risk factors for coronary artery disease include male gender, family history and smoking/tobacco exposure. Past treatments include calcium channel blockers. The current treatment provides mild improvement. There is no history of kidney disease, CAD/MI, CVA, heart failure or a thyroid problem. There is no history of sleep apnea.  Arthritis Presents for follow-up visit. The disease course has been fluctuating. He complains of pain. Affected locations include the left hip (Spine). Associated symptoms include fatigue. His past medical history is significant for chronic back pain and osteoarthritis. Past treatments include rest, OTC med and corticosteroids. The treatment provided moderate relief.  Gastroesophageal Reflux He reports no chest pain, no coughing or no heartburn. This is a chronic problem. The current episode started more than 1 year ago. The problem occurs rarely. The problem has been resolved. The symptoms are aggravated by lying down and certain foods. Associated symptoms include fatigue. Pertinent negatives include no muscle weakness. He has tried a PPI for the symptoms. The treatment provided significant relief.      Review of Systems  Constitutional: Positive for fatigue.  HENT: Negative.   Eyes: Negative for blurred vision.  Respiratory: Negative.   Negative for cough and shortness of breath.   Cardiovascular: Negative.  Negative for chest pain and palpitations.  Gastrointestinal: Negative.  Negative for heartburn.  Endocrine: Negative.   Genitourinary: Negative.   Musculoskeletal: Positive for arthritis. Negative for muscle weakness.  Neurological: Negative.  Negative for headaches.  Hematological: Negative.   Psychiatric/Behavioral: Negative.   All other systems reviewed and are negative.      Objective:   Physical Exam  Constitutional: He is oriented to person, place, and time. He appears well-developed and well-nourished. No distress.  HENT:  Head: Normocephalic.  Right Ear: External ear normal.  Left Ear: External ear normal.  Nose: Nose normal.  Mouth/Throat: Oropharynx is clear and moist.  Eyes: Pupils are equal, round, and reactive to light. Right eye exhibits no discharge. Left eye exhibits no discharge.  Neck: Normal range of motion. Neck supple. No thyromegaly present.  Cardiovascular: Normal rate, regular rhythm, normal heart sounds and intact distal pulses.   No murmur heard. Pulmonary/Chest: Effort normal and breath sounds normal. No respiratory distress. He has no wheezes.  Abdominal: Soft. Bowel sounds are normal. He exhibits no distension. There is no tenderness.  Musculoskeletal: Normal range of motion. He exhibits no edema or tenderness.  Neurological: He is alert and oriented to person, place, and time. He has normal reflexes. No cranial nerve deficit.  Skin: Skin is warm and dry. No rash noted. No erythema.  Psychiatric: He has a normal mood and affect. His behavior is normal. Judgment and thought content normal.  Vitals reviewed.     BP 135/88 mmHg  Pulse 68  Temp(Src) 97.1 F (36.2 C) (Oral)  Ht 5' 8" (1.727 m)  Wt  198 lb (89.812 kg)  BMI 30.11 kg/m2     Assessment & Plan:  1. Essential hypertension, benign - CMP14+EGFR  2. Gastroesophageal reflux disease, esophagitis presence not  specified - CMP14+EGFR  3. Arthritis - CMP14+EGFR - celecoxib (CELEBREX) 100 MG capsule; Take 1 capsule (100 mg total) by mouth 2 (two) times daily.  Dispense: 60 capsule; Refill: 1  4. Gout, unspecified - CMP14+EGFR  5. Hyperlipemia - CMP14+EGFR  6. Vitamin D deficiency - CMP14+EGFR - VITAMIN D 25 Hydroxy (Vit-D Deficiency, Fractures)  7. Current smoker - CMP14+EGFR  8. Other fatigue - CMP14+EGFR  9. Colon cancer screening - Fecal occult blood, imunochemical; Future   Continue all meds Labs pending Health Maintenance reviewed Diet and exercise encouraged RTO 6 months  Evelina Dun, FNP

## 2015-04-07 LAB — CMP14+EGFR
A/G RATIO: 1.7 (ref 1.2–2.2)
ALT: 13 IU/L (ref 0–44)
AST: 11 IU/L (ref 0–40)
Albumin: 4.2 g/dL (ref 3.6–4.8)
Alkaline Phosphatase: 101 IU/L (ref 39–117)
BILIRUBIN TOTAL: 0.3 mg/dL (ref 0.0–1.2)
BUN/Creatinine Ratio: 28 — ABNORMAL HIGH (ref 10–22)
BUN: 21 mg/dL (ref 8–27)
CHLORIDE: 103 mmol/L (ref 96–106)
CO2: 25 mmol/L (ref 18–29)
Calcium: 9.3 mg/dL (ref 8.6–10.2)
Creatinine, Ser: 0.74 mg/dL — ABNORMAL LOW (ref 0.76–1.27)
GFR calc non Af Amer: 95 mL/min/{1.73_m2} (ref >59)
GFR, EST AFRICAN AMERICAN: 110 mL/min/{1.73_m2} (ref >59)
Globulin, Total: 2.5 g/dL (ref 1.5–4.5)
Glucose: 110 mg/dL — ABNORMAL HIGH (ref 65–99)
POTASSIUM: 4.4 mmol/L (ref 3.5–5.2)
SODIUM: 141 mmol/L (ref 134–144)
Total Protein: 6.7 g/dL (ref 6.0–8.5)

## 2015-04-07 LAB — VITAMIN D 25 HYDROXY (VIT D DEFICIENCY, FRACTURES): VIT D 25 HYDROXY: 34.1 ng/mL (ref 30.0–100.0)

## 2015-04-10 ENCOUNTER — Telehealth: Payer: Self-pay | Admitting: Family

## 2015-04-10 MED ORDER — PREDNISONE 10 MG (21) PO TBPK: 10.0000 mg | ORAL_TABLET | Freq: Every day | ORAL | Status: DC

## 2015-04-10 NOTE — Telephone Encounter (Signed)
Patient aware.

## 2015-04-10 NOTE — Telephone Encounter (Signed)
Patient wants to know if a steroid shot or oral steroids would help with his leg pain. He states the pain has became really bad. Please advise

## 2015-04-10 NOTE — Telephone Encounter (Signed)
Steroid dose pack sent to [facility].

## 2015-04-11 ENCOUNTER — Other Ambulatory Visit: Payer: Self-pay

## 2015-04-25 ENCOUNTER — Other Ambulatory Visit: Payer: Self-pay | Admitting: Family

## 2015-04-25 NOTE — Telephone Encounter (Signed)
Methotrexate is a chemotherapy drug we use for arthritis is that what he wants? Has he taken before

## 2015-04-26 ENCOUNTER — Telehealth: Payer: Self-pay | Admitting: Family Medicine

## 2015-04-26 ENCOUNTER — Ambulatory Visit (INDEPENDENT_AMBULATORY_CARE_PROVIDER_SITE_OTHER): Payer: Medicare Other

## 2015-04-26 ENCOUNTER — Other Ambulatory Visit: Payer: Self-pay | Admitting: Family

## 2015-04-26 ENCOUNTER — Ambulatory Visit (INDEPENDENT_AMBULATORY_CARE_PROVIDER_SITE_OTHER): Payer: Medicare Other | Admitting: Family Medicine

## 2015-04-26 ENCOUNTER — Encounter: Payer: Self-pay | Admitting: Family Medicine

## 2015-04-26 VITALS — BP 147/87 | HR 72 | Temp 97.0°F | Ht 68.0 in | Wt 195.6 lb

## 2015-04-26 DIAGNOSIS — M25562 Pain in left knee: Secondary | ICD-10-CM

## 2015-04-26 DIAGNOSIS — M25561 Pain in right knee: Secondary | ICD-10-CM | POA: Diagnosis not present

## 2015-04-26 MED ORDER — KETOROLAC TROMETHAMINE 60 MG/2ML IM SOLN
60.0000 mg | Freq: Once | INTRAMUSCULAR | Status: AC
  Administered 2015-04-26: 60 mg via INTRAMUSCULAR

## 2015-04-26 MED ORDER — KETOROLAC TROMETHAMINE 60 MG/2ML IM SOLN: 60.0000 mg | Freq: Once | INTRAMUSCULAR | Status: DC

## 2015-04-26 NOTE — Addendum Note (Signed)
Addended by: Lorelee CoverOSTOSKY, JESSICA C on: 04/26/2015 12:47 PM   Modules accepted: Orders

## 2015-04-26 NOTE — Telephone Encounter (Signed)
Pt notified of results Verbalizes understanding 

## 2015-04-26 NOTE — Progress Notes (Signed)
   HPI  Patient presents today here with knee pain.  Patient's lines he's had knee pain in the last 2 years, it seems to be stable to slightly worse. He describes it as bilateral knee pain with medial knee swelling that happens throughout the day. He's tried compression with minimal improvement. He has tried Tylenol with good improvement. He requests medication, specifically he requests morphine. He explains that the medications that have been prescribed, Celebrex, are too expensive.  He has severe negative reaction to prednisone previously. He has a history of shoulder pain that has resolved. He has history of severe hip arthritis that he has seen orthopedic surgery for. He does not wish to get any surgery if he can avoid it.   PMH: Smoking status noted ROS: Per HPI  Objective: BP 147/87 mmHg  Pulse 72  Temp(Src) 97 F (36.1 C) (Oral)  Ht 5\' 8"  (1.727 m)  Wt 195 lb 9.6 oz (88.724 kg)  BMI 29.75 kg/m2 Gen: NAD, alert, cooperative with exam HEENT: NCAT CV: RRR, good S1/S2, no murmur Resp: CTABL, no wheezes, non-labored Abd: SNTND, BS present, no guarding or organomegaly Ext: No edema, warm Neuro: Alert and oriented, No gross deficits  MSK: BL knee without erythema, effusion, bruising, or gross deformity No crepitus bilaterally No joint line tenderness.  ligamentously intact to Lachman's and with varus and valgus stress.  Negative McMurray's test   Assessment and plan:  # Bilateral knee pain Possible osteoarthritis, discussed at length the differences between osteoarthritis and rheumatoid arthritis, he was considering several DMARDs such as Enbrel and methotrexate. Recommended continue Tylenol for now, x-rays to evaluate baseline arthritic changes Discussed if severe changes would refer to orthopedic surgery for discussion. Consider tramadol, this is tear two on his plan, if he needs stronger medication. Return to clinic with any worsening symptoms or. Improve as  expected.    Orders Placed This Encounter  Procedures  . DG Knee 1-2 Views Left    Standing Status: Future     Number of Occurrences:      Standing Expiration Date: 06/25/2016    Order Specific Question:  Reason for Exam (SYMPTOM  OR DIAGNOSIS REQUIRED)    Answer:  knee pain    Order Specific Question:  Preferred imaging location?    Answer:  Internal  . DG Knee 1-2 Views Right    Standing Status: Future     Number of Occurrences:      Standing Expiration Date: 06/25/2016    Order Specific Question:  Reason for Exam (SYMPTOM  OR DIAGNOSIS REQUIRED)    Answer:  knee pain    Order Specific Question:  Preferred imaging location?    Answer:  Internal     Murtis SinkSam Bradshaw, MD [facility] 04/26/2015, 8:40 AM

## 2015-04-26 NOTE — Addendum Note (Signed)
Addended by: Lorelee CoverOSTOSKY, Steffen Hase C on: 04/26/2015 12:41 PM   Modules accepted: Orders

## 2015-07-25 ENCOUNTER — Ambulatory Visit (INDEPENDENT_AMBULATORY_CARE_PROVIDER_SITE_OTHER): Payer: Medicare Other | Admitting: Physician Assistant

## 2015-07-25 ENCOUNTER — Encounter: Payer: Self-pay | Admitting: Physician Assistant

## 2015-07-25 VITALS — BP 135/89 | HR 70 | Temp 97.0°F | Ht 68.0 in | Wt 193.2 lb

## 2015-07-25 DIAGNOSIS — M25561 Pain in right knee: Secondary | ICD-10-CM

## 2015-07-25 DIAGNOSIS — M25562 Pain in left knee: Secondary | ICD-10-CM

## 2015-07-25 NOTE — Progress Notes (Signed)
Subjective:     Patient ID: Jacob Sanders, male   DOB: 05/24/1945, 70 y.o.   MRN: [id number]  HPI Pt with a long hx of multiple joint pain He is on chronic NSAIDS and pain meds for same [facility] [place] He had labs that have ruled out RA but does have a hx of gout Here today due to bilat knee pain Seen for same ~ 3 months ago  Review of Systems Denies any locking or giving way of the knees + pain that increases with weight bearing Also with stiffness with sedentary for time Some swelling with standing    Objective:   Physical Exam NAD Gait assisted with cane No effusion of either  knee R knee- no edema or ecchy, FROM of the knee with sl patellar crepitus, no laxity good strength L knee- no edema or ecchy, FROM with sl patellar crepitus, no laxity, good strength Since pt already on NSAIDS  offered injection Consent obtained Both medial knees prepped with Betadine Under sterile technique 1.5 cc marc/1.5cc Kenalog given to each knee Dressing placed    Assessment:     1. Bilateral knee pain        Plan:     Continue with all of his other meds If sx continue recommended he f/u with Ortho Keep regular f/u here Heat/Ice Activities as tol

## 2015-07-25 NOTE — Patient Instructions (Signed)

## 2015-08-15 ENCOUNTER — Other Ambulatory Visit: Payer: Self-pay | Admitting: Family

## 2015-11-06 ENCOUNTER — Ambulatory Visit (INDEPENDENT_AMBULATORY_CARE_PROVIDER_SITE_OTHER): Payer: Medicare Other | Admitting: Family Medicine

## 2015-11-06 ENCOUNTER — Encounter: Payer: Self-pay | Admitting: Family Medicine

## 2015-11-06 VITALS — BP 104/69 | HR 65 | Temp 97.0°F | Ht 68.0 in | Wt 184.0 lb

## 2015-11-06 DIAGNOSIS — M79671 Pain in right foot: Secondary | ICD-10-CM | POA: Diagnosis not present

## 2015-11-06 DIAGNOSIS — M170 Bilateral primary osteoarthritis of knee: Secondary | ICD-10-CM | POA: Insufficient documentation

## 2015-11-06 DIAGNOSIS — M79672 Pain in left foot: Secondary | ICD-10-CM

## 2015-11-06 DIAGNOSIS — B356 Tinea cruris: Secondary | ICD-10-CM

## 2015-11-06 MED ORDER — TRIAMCINOLONE ACETONIDE 40 MG/ML IJ SUSP
40.0000 mg | Freq: Once | INTRAMUSCULAR | Status: AC
  Administered 2015-11-06: 40 mg via INTRAMUSCULAR

## 2015-11-06 NOTE — Addendum Note (Signed)
Addended by: Lorelee CoverOSTOSKY, Ana Woodroof C on: 11/06/2015 02:03 PM   Modules accepted: Orders

## 2015-11-06 NOTE — Progress Notes (Signed)
   HPI  Patient presents today here with bilateral knee pain.  Patient explains that he had some transient improvement of knee pain after the injections in July. He also now has bilateral heel pain with walking, bilateral calf pain, and bilateral thigh pain. His x-rays were reviewed from last April which showed mild tricompartmental Oster arthritis of the bilateral knees.  Meloxicam does not seem to be helping much, over-the-counter Aleve seems to help more.  He has been seen by orthopedic surgery who did not recommend any further surgical treatment.  He has a groin rash which has responded reasonably well to apples cider vinegar and Lamisil.  PMH: Smoking status noted ROS: Per HPI  Objective: BP 104/69   Pulse 65   Temp 97 F (36.1 C) (Oral)   Ht 5\' 8"  (1.727 m)   Wt 184 lb (83.5 kg)   BMI 27.98 kg/m  Gen: NAD, alert, cooperative with exam HEENT: NCAT, EOMI, PERRL CV: RRR, good S1/S2, no murmur Resp: CTABL, no wheezes, non-labored Ext: No edema, warm Neuro: Alert and oriented, No gross deficits  Skin Light brown macular rash in bilateral groin with several areas consistent with resolving tinea cruris  MSK: BL knee without erythema, effusion, bruising, or gross deformity No joint line tenderness.  ligamentously intact to Lachman's and with varus and valgus stress.  Negative McMurray's test  No tenderness to palpation of the plantar fascial insertion bilaterally     Assessment and plan:  # Bilateral knee pain Worsening X-ray shows mild tricompartmental OA Discussed side effects of recurrent knee injections, patient's symptoms are not terrible at this time so he will try IM injection of Kenalog instead. This may also help with bilateral heel pain and leg pain. Stop meloxicam, not helping Trial of naproxen, discussed dosing  # Rash, tinea cruris Discussed using Lamisil Apple cider vinegar should not cause harm, however I believe Lamisil for direct  treatment  Heel pain Unclear etiology, suspect heel spur No signs of plantar fascitis today, watchful waiting  Murtis SinkSam Bradshaw, MD [facility] 11/06/2015, 12:55 PM

## 2015-11-06 NOTE — Patient Instructions (Signed)
Great to see you!  Stop meloxicam, try naproxen (OTC) 1-2 pills once or twice daily.   Also try ice when the pain is the worst.   For your rash: Apple cider vinegar is ok but I think lamisil cream will get good relief, try twie daily.

## 2015-11-08 ENCOUNTER — Other Ambulatory Visit: Payer: Self-pay | Admitting: Family

## 2015-11-26 ENCOUNTER — Telehealth: Payer: Self-pay

## 2015-11-26 ENCOUNTER — Telehealth: Payer: Self-pay | Admitting: Family

## 2015-11-26 DIAGNOSIS — F17200 Nicotine dependence, unspecified, uncomplicated: Secondary | ICD-10-CM

## 2015-11-26 DIAGNOSIS — I10 Essential (primary) hypertension: Secondary | ICD-10-CM

## 2015-11-26 NOTE — Telephone Encounter (Signed)
Vascular referral ordered

## 2015-11-26 NOTE — Telephone Encounter (Signed)
Deb, referral was done today = can you work on this?

## 2015-12-18 ENCOUNTER — Encounter: Payer: Self-pay | Admitting: Cardiovascular Disease

## 2015-12-18 ENCOUNTER — Ambulatory Visit (INDEPENDENT_AMBULATORY_CARE_PROVIDER_SITE_OTHER): Payer: Medicare Other | Admitting: Cardiovascular Disease

## 2015-12-18 VITALS — BP 146/84 | HR 61 | Ht 70.0 in | Wt 182.0 lb

## 2015-12-18 DIAGNOSIS — R9431 Abnormal electrocardiogram [ECG] [EKG]: Secondary | ICD-10-CM | POA: Diagnosis not present

## 2015-12-18 DIAGNOSIS — R0602 Shortness of breath: Secondary | ICD-10-CM | POA: Diagnosis not present

## 2015-12-18 DIAGNOSIS — M79604 Pain in right leg: Secondary | ICD-10-CM | POA: Diagnosis not present

## 2015-12-18 DIAGNOSIS — M79605 Pain in left leg: Secondary | ICD-10-CM

## 2015-12-18 DIAGNOSIS — Z720 Tobacco use: Secondary | ICD-10-CM | POA: Diagnosis not present

## 2015-12-18 NOTE — Progress Notes (Signed)
Cardiology Office Note   Date:  12/18/2015   ID:  Jacob Dakinshurman E Overbeck, DOB 1945/01/23, MRN [id number]  PCP:  Jannifer Rodneyhristy Hawks, FNP  Cardiologist:   Lorine BearsMuhammad Warrene Kapfer, MD   No chief complaint on file.     History of Present Illness: Jacob Sanders is a 70 y.o. male who was referred for evaluation of bilateral leg pain and possible peripheral arterial disease. He has no previous cardiac history and no prior history of PAD. He has known history of essential hypertension and prolonged history of tobacco use. He has been smoking one to 2 packs per day for about 50 years. He had back surgery in 1999 and he does have significant osteoarthritis. He denies any chest pain. He complains of chronic exertional dyspnea with no orthopnea or PND. He reports prolonged history of bilateral thigh and calf discomfort with walking and at rest. The pain is in the muscles as well as the joints. He does have chronic varicose veins. He has no lower extremity ulceration. He does have family history of coronary artery disease.    Past Medical History:  Diagnosis Date  . Arthritis   . GERD (gastroesophageal reflux disease)   . Hyperlipidemia   . Hypertension     Past Surgical History:  Procedure Laterality Date  . SPINE SURGERY     Resulting staph infection     Current Outpatient Prescriptions  Medication Sig Dispense Refill  . Acetaminophen (TYLENOL ARTHRITIS PAIN PO) Take 1 tablet by mouth 3 (three) times daily as needed (pain).     Marland Kitchen. amLODipine (NORVASC) 10 MG tablet Take 1 tablet by mouth  daily 90 tablet 1  . meloxicam (MOBIC) 15 MG tablet TAKE 1 TABLET BY MOUTH  DAILY 90 tablet 0  . omeprazole (PRILOSEC) 20 MG capsule TAKE 1 CAPSULE BY MOUTH  DAILY AS NEEDED 90 capsule 0   No current facility-administered medications for this visit.     Allergies:   Prednisone    Social History:  The patient  reports that he has been smoking Cigars.  He has a 15.00 pack-year smoking history. He has never used  smokeless tobacco. He reports that he does not drink alcohol or use drugs.   Family History:  The patient's family history includes Aneurysm in his father.    ROS:  Please see the history of present illness.   Otherwise, review of systems are positive for none.   All other systems are reviewed and negative.    PHYSICAL EXAM: VS:  BP (!) 146/84   Pulse 61   Ht 5\' 10"  (1.778 m)   Wt 182 lb (82.6 kg)   BMI 26.11 kg/m  , BMI Body mass index is 26.11 kg/m. GEN: Well nourished, well developed, in no acute distress  HEENT: normal  Neck: no JVD, carotid bruits, or masses Cardiac: RRR; no murmurs, rubs, or gallops,no edema  Respiratory:  clear to auscultation bilaterally, normal work of breathing GI: soft, nontender, nondistended, + BS MS: no deformity or atrophy  Skin: warm and dry, no rash Neuro:  Strength and sensation are intact Psych: euthymic mood, full affect Vascular: Femoral pulses are normal bilaterally. Posterior tibial is normal bilaterally. Dorsalis pedis is diminished.  EKG:  EKG is ordered today. 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   Recent Labs: 04/06/2015: ALT 13; BUN 21; Creatinine, Ser 0.74; Potassium 4.4; Sodium 141    Lipid Panel No  results found for: CHOL, TRIG, HDL, CHOLHDL, VLDL, LDLCALC, LDLDIRECT    Wt Readings from Last 3 Encounters:  12/18/15 182 lb (82.6 kg)  11/06/15 184 lb (83.5 kg)  07/25/15 193 lb 3.2 oz (87.6 kg)      PAD Screen 12/18/2015  Previous PAD dx? No  Previous surgical procedure? No  Pain with walking? Yes  Subsides with rest? No  Feet/toe relief with dangling? No  Painful, non-healing ulcers? No  Extremities discolored? No      ASSESSMENT AND PLAN:  1.  Bilateral leg pain: The symptoms are atypical for claudication and seems to be suggestive more of a neuropathic or joint related problems. His physical exam is also not highly  suggestive of peripheral arterial disease. I requested lower extremity arterial Doppler for evaluation. He might benefit from orthopedic evaluation.  2. Abnormal EKG with exertional dyspnea. EKG is suggestive of prior inferior infarct. Due to that, I requested a pharmacologic nuclear stress test.  3. Abdominal aortic aneurysm screening: Given his age and smoking status, I recommend one-time screening for abdominal aortic aneurysm.  Disposition:   FU with me as needed of testing is abnormal.  Signed,  Lorine BearsMuhammad Arushi Partridge, MD  12/18/2015 5:22 PM    [facility]

## 2015-12-18 NOTE — Patient Instructions (Signed)
Medication Instructions:  Continue current medications  Labwork: None Ordered  Testing/Procedures: Your physician has requested that you have a lexiscan myoview. For further information please visit [email or web address]. Please follow instruction sheet, as given.  Your physician has requested that you have a lower extremity arterial duplex. This test is an ultrasound of the arteries in the legs or arms. It looks at arterial blood flow in the legs and arms. Allow one hour for Lower and Upper Arterial scans. There are no restrictions or special instructions  Your physician has requested that you have an aorta Illiac duplex.    Follow-Up: Your physician recommends that you schedule a follow-up appointment in: As Needed   Any Other Special Instructions Will Be Listed Below (If Applicable).   If you need a refill on your cardiac medications before your next appointment, please call your pharmacy.

## 2015-12-26 ENCOUNTER — Other Ambulatory Visit: Payer: Self-pay | Admitting: Cardiovascular Disease

## 2015-12-26 ENCOUNTER — Other Ambulatory Visit: Payer: Self-pay | Admitting: Family

## 2015-12-26 DIAGNOSIS — M79604 Pain in right leg: Secondary | ICD-10-CM

## 2015-12-26 DIAGNOSIS — M79605 Pain in left leg: Principal | ICD-10-CM

## 2016-01-10 ENCOUNTER — Other Ambulatory Visit: Payer: Self-pay | Admitting: Family Medicine

## 2016-01-15 ENCOUNTER — Telehealth (HOSPITAL_COMMUNITY): Payer: Self-pay

## 2016-01-15 NOTE — Telephone Encounter (Signed)
Encounter complete. 

## 2016-01-17 ENCOUNTER — Ambulatory Visit (HOSPITAL_COMMUNITY)
Admission: RE | Admit: 2016-01-17 | Discharge: 2016-01-17 | Disposition: A | Discharge status: Home / Self Care | Payer: Medicare Other | Source: Ambulatory Visit | Attending: Cardiology | Admitting: Cardiology

## 2016-01-17 ENCOUNTER — Encounter (HOSPITAL_COMMUNITY): Payer: Medicare Other

## 2016-01-17 DIAGNOSIS — M79605 Pain in left leg: Secondary | ICD-10-CM | POA: Diagnosis not present

## 2016-01-17 DIAGNOSIS — M79604 Pain in right leg: Secondary | ICD-10-CM

## 2016-01-17 DIAGNOSIS — Z136 Encounter for screening for cardiovascular disorders: Secondary | ICD-10-CM | POA: Diagnosis not present

## 2016-01-17 DIAGNOSIS — I739 Peripheral vascular disease, unspecified: Secondary | ICD-10-CM | POA: Diagnosis not present

## 2016-01-17 DIAGNOSIS — R0602 Shortness of breath: Secondary | ICD-10-CM

## 2016-01-17 DIAGNOSIS — R9431 Abnormal electrocardiogram [ECG] [EKG]: Secondary | ICD-10-CM | POA: Diagnosis not present

## 2016-01-17 LAB — MYOCARDIAL PERFUSION IMAGING
CHL CUP NUCLEAR SDS: 1
CHL CUP RESTING HR STRESS: 68 {beats}/min
LV dias vol: 78 mL (ref 62–150)
LV sys vol: 49 mL
Peak HR: 88 {beats}/min
SRS: 3
SSS: 4
TID: 0.88

## 2016-01-17 IMAGING — NM NM MISC PROCEDURE
6 series · 36 of 36 positions shown · non-contrast
Comparison: none

[Series 1: wbr_r-proj_st wbr rest · 6.40mm/px · 6 of 64 frames shown]
[frame 6/64]
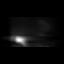
[frame 16/64]
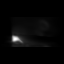
[frame 27/64]
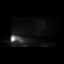
[frame 38/64]
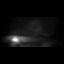
[frame 48/64]
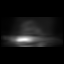
[frame 59/64]
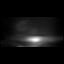

[Series 1: wbr rest · 6.40mm/px · 6 of 64 frames shown]
[frame 6/64]
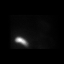
[frame 16/64]
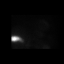
[frame 27/64]
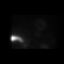
[frame 38/64]
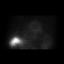
[frame 48/64]
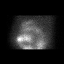
[frame 59/64]
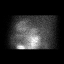

[Series 2: wbr stress-gsp · 6.40mm/px · 6 of 512 frames shown]
[frame 43/512]
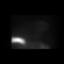
[frame 128/512]
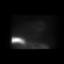
[frame 214/512]
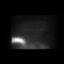
[frame 299/512]
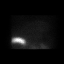
[frame 384/512]
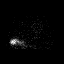
[frame 470/512]
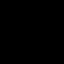

[Series 2: wbr_s-proj_st wbr stress-gsp · 6.40mm/px · 6 of 512 frames shown]
[frame 43/512]
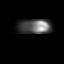
[frame 128/512]
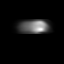
[frame 214/512]
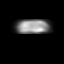
[frame 299/512]
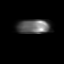
[frame 384/512]
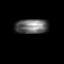
[frame 470/512]
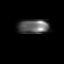

[Series 3: wbr_s-proj_st wbr stress-sum-em · 6.40mm/px · 6 of 64 frames shown]
[frame 6/64]
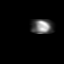
[frame 16/64]
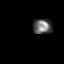
[frame 27/64]
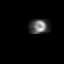
[frame 38/64]
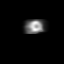
[frame 48/64]
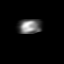
[frame 59/64]
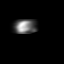

[Series 3: wbr stress-sum-em · 6.40mm/px · 6 of 64 frames shown]
[frame 6/64]
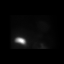
[frame 16/64]
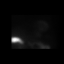
[frame 27/64]
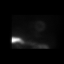
[frame 38/64]
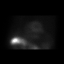
[frame 48/64]
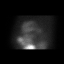
[frame 59/64]
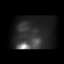

[36 of 36 positions shown; findings below may reference images not displayed]

Canned report from images found in remote index.

Refer to host system for actual result text.

## 2016-01-17 MED ORDER — TECHNETIUM TC 99M TETROFOSMIN IV KIT
10.8000 | PACK | Freq: Once | INTRAVENOUS | Status: AC | PRN
  Administered 2016-01-17: 10.8 via INTRAVENOUS
  Filled 2016-01-17: qty 11

## 2016-01-17 MED ORDER — AMINOPHYLLINE 25 MG/ML IV SOLN
75.0000 mg | Freq: Once | INTRAVENOUS | Status: AC
  Administered 2016-01-17: 75 mg via INTRAVENOUS

## 2016-01-17 MED ORDER — TECHNETIUM TC 99M TETROFOSMIN IV KIT
32.0000 | PACK | Freq: Once | INTRAVENOUS | Status: AC | PRN
  Administered 2016-01-17: 32 via INTRAVENOUS
  Filled 2016-01-17: qty 32

## 2016-01-17 MED ORDER — REGADENOSON 0.4 MG/5ML IV SOLN
0.4000 mg | Freq: Once | INTRAVENOUS | Status: AC
  Administered 2016-01-17: 0.4 mg via INTRAVENOUS

## 2016-01-22 ENCOUNTER — Telehealth: Payer: Self-pay | Admitting: Cardiovascular Disease

## 2016-01-22 DIAGNOSIS — R9431 Abnormal electrocardiogram [ECG] [EKG]: Secondary | ICD-10-CM

## 2016-01-22 DIAGNOSIS — R0600 Dyspnea, unspecified: Secondary | ICD-10-CM

## 2016-01-22 NOTE — Telephone Encounter (Signed)
Mrs. Katrinka BlazingSmith is calling to find out about test results . Please call .Marland Kitchen.  Thanks

## 2016-01-22 NOTE — Telephone Encounter (Signed)
I spoke with the pt and gave him results of myoview and vascular tests. Per Dr Kirke CorinArida the pt needs an echocardiogram to assess EF.  I have scheduled the pt for Echo on 01/24/16.

## 2016-01-24 ENCOUNTER — Encounter: Payer: Self-pay | Admitting: Cardiovascular Disease

## 2016-01-24 ENCOUNTER — Encounter (HOSPITAL_COMMUNITY): Payer: Self-pay

## 2016-01-24 ENCOUNTER — Other Ambulatory Visit: Payer: Self-pay

## 2016-01-24 ENCOUNTER — Ambulatory Visit (HOSPITAL_COMMUNITY): Payer: Medicare Other | Attending: Cardiovascular Disease

## 2016-01-24 ENCOUNTER — Telehealth: Payer: Self-pay | Admitting: *Deleted

## 2016-01-24 ENCOUNTER — Ambulatory Visit (INDEPENDENT_AMBULATORY_CARE_PROVIDER_SITE_OTHER): Payer: Medicare Other | Admitting: Cardiovascular Disease

## 2016-01-24 VITALS — BP 126/90 | HR 71 | Ht 70.0 in | Wt 183.0 lb

## 2016-01-24 DIAGNOSIS — R0600 Dyspnea, unspecified: Secondary | ICD-10-CM | POA: Insufficient documentation

## 2016-01-24 DIAGNOSIS — Z79899 Other long term (current) drug therapy: Secondary | ICD-10-CM

## 2016-01-24 DIAGNOSIS — I4891 Unspecified atrial fibrillation: Secondary | ICD-10-CM

## 2016-01-24 DIAGNOSIS — R079 Chest pain, unspecified: Secondary | ICD-10-CM | POA: Diagnosis not present

## 2016-01-24 DIAGNOSIS — R9431 Abnormal electrocardiogram [ECG] [EKG]: Secondary | ICD-10-CM | POA: Insufficient documentation

## 2016-01-24 MED ORDER — WARFARIN SODIUM 5 MG PO TABS: 5.0000 mg | ORAL_TABLET | Freq: Every day | ORAL | 1 refills | Status: DC

## 2016-01-24 MED ORDER — RIVAROXABAN 20 MG PO TABS: 20.0000 mg | ORAL_TABLET | Freq: Every day | ORAL | 3 refills | Status: DC

## 2016-01-24 MED ORDER — DILTIAZEM HCL ER COATED BEADS 240 MG PO TB24: 240.0000 mg | ORAL_TABLET | Freq: Every day | ORAL | 3 refills | Status: DC

## 2016-01-24 NOTE — Progress Notes (Signed)
Mr. Katrinka BlazingSmith presented for an echocardiogram this morning. He was in a-fib with HR between 80 and 120bpm and was not documented in his chart. DOD (Dr. Eden EmmsNishan) was notified and is now getting an EKG to confirm.   Dewitt HoesBilly Aalyiah Camberos, [place] 01/24/2016

## 2016-01-24 NOTE — Patient Instructions (Addendum)
Medication Instructions:  Your physician has recommended you make the following change in your medication:  1-STOP amlodipine  2-START Cardizem 240 mg by mouth daily  3-START Coumadin 5 mg by mouth daily  Labwork: Your physician recommends that you have lab work today- CBC and BMET  Testing/Procedures: NONE  Follow-Up: Your physician recommends that you schedule a follow-up appointment with [facility] as a new start on Tuesday  Your physician recommends that you schedule a follow-up appointment with Dr. Kirke CorinArida after [facility].    If you need a refill on your cardiac medications before your next appointment, please call your pharmacy.    What You Need to Know About Warfarin Warfarin is a blood thinner (anticoagulant). Anticoagulants help to prevent the formation of blood clots. They also help to stop the growth of blood clots. Who should use warfarin? Warfarin is prescribed for people who are at risk for developing harmful blood clots, such as people who have:  Surgically implanted mechanical heart valves.  Irregular heart rhythms (atrial fibrillation).  Certain clotting disorders.  A history of harmful blood clotting in the past. This includes people who have had:  A stroke.  Blood clot in the lungs (pulmonary embolism, or PE).  Blood clot in the legs (deep vein thrombosis, or DVT).  An existing blood clot. How is warfarin taken?   Warfarin is a medicine that you take by mouth (orally). Warfarin tablets come in different strengths. Each tablet strength is a different color, with the amount of warfarin printed on the tablet. If you get a new prescription filled and the color of your tablet is different than usual, tell your pharmacist or health care provider immediately. What blood tests do I need while taking warfarin? The goal of warfarin therapy is to lessen the clotting tendency of blood, but not to prevent clotting completely. Your health care provider  will monitor the anticoagulation effect of warfarin closely and will adjust your dose as needed. Warfarin is a medicine that needs to be closely monitored, so it is very important to keep all lab visits and follow-up visits with your health care provider. While taking warfarin, you will need to have blood tests (prothrombin tests, or PT tests) regularly to measure your blood clotting time. This type of test can be done with a finger stick or a blood draw. What does the INR test result mean? The PT test results will be reported as the International Normalized Ratio (INR). The INR tells your health care provider whether your dosage of warfarin needs to be changed. The longer it takes your blood to clot, the higher the INR. Your health care provider will tell you your target INR range. If your INR is not in your target range, your health care provider may adjust your dosage.  If your INR is above your target range, there is a risk of bleeding. Your dosage of warfarin may need to be decreased.  If your INR is below your target range, there is a risk of clotting. Your dosage of warfarin may need to be increased. How often is the INR test needed?  When you first start warfarin, you will usually have your INR checked every few days.  You may need to have INR tests done more than once a week until you are taking the correct dosage of warfarin.  After you have reached your target INR, your INR will be tested less often. However, you will need to have your INR checked at least once every  4-6 weeks for the entire time you are taking warfarin. What are the side effects of warfarin? Too much warfarin can cause bleeding (hemorrhage) in any part of the body, such as:  Bleeding from the gums.  Unexplained bruises.  Bruises that get larger.  Blood in the urine.  Bloody or dark stools.  Bleeding in the brain (hemorrhagic stroke).  A nosebleed that is not easily stopped.  Coughing up blood.  Vomiting  blood. Warfarin use may also cause:  Skin rash or irritations  Nausea that does not go away.  Severe pain in the back or joints.  Painful toes that turn blue or purple (purple toe syndrome).  Painful ulcers that do not go away (skin necrosis). What are the signs and symptoms of a blood clot? Too little warfarin can increase the risk of blood clots in your legs, lungs, or arms. Signs and symptoms of a DVT in your leg or arm may include:  Pain or swelling in your leg or arm.  Skin that is red or warm to the touch on your arm or leg. Signs and symptoms of a pulmonary embolism may include:  Shortness of breath or difficulty breathing.  Chest pain.  Unexplained fever. What are the signs and symptoms of a stroke? If you are taking too much or too little warfarin, you can have a stroke. Signs and symptoms of a stroke may include:  Weakness or numbness of your face, arm, or leg, especially on one side of your body.  Confusion or trouble thinking clearly.  Difficulty seeing with one or both eyes.  Difficulty walking or moving your arms or legs.  Dizziness.  Loss of balance or coordination.  Trouble speaking, trouble understanding speech, or both (aphasia).  Sudden, severe headache with no known cause.  Partial or total loss of consciousness. What precautions do I need to take while using warfarin?   Take warfarin exactly as told by your health care provider. Doing this helps you avoid bleeding or blood clots that could result in serious injury, pain, or disability.  Take your medicine at the same time every day. If you forget to take your dose of warfarin, take it as soon as you remember that day. If you do not remember on that day, do not take an extra dose the next day.  Contact your health care provider if you miss or take an extra dose. Do not change your dosage on your own to make up for missed or extra doses.  Wear or carry identification that says that you are  taking warfarin.  Make sure that all health care providers, including your dentist, know you are taking warfarin.  If you need surgery, talk with your health care provider about whether you should stop taking warfarin before your surgery.  Avoid situations that cause bleeding. You may bleed more easily while taking warfarin. To limit bleeding, take the following actions:  Use a softer toothbrush.  Floss with waxed floss, not unwaxed floss.  Shave with an electric razor, not with a blade.  Limit your use of sharp objects.  Avoid potentially harmful activities, such as contact sports. What do I need to know about warfarin and pregnancy or breastfeeding?  Warfarin is not recommended during the first trimester of pregnancy due to an increased risk of birth defects. In certain situations, a woman may take warfarin after her first trimester of pregnancy.  If you are taking warfarin and you become pregnant or plan to become pregnant, contact your  health care provider right away.  If you plan to breastfeed while taking warfarin, talk with your health care provider first. What do I need to know about warfarin and alcohol or drug use?  Avoid drinking alcohol, or limit alcohol intake to no more than 1 drink a day for nonpregnant women and 2 drinks a day for men. One drink equals 12 oz of beer, 5 oz of wine, or 1 oz of hard liquor.  If you change the amount of alcohol that you drink, tell your health care provider. Your warfarin dosage may need to be changed.  Avoid tobacco products, such as cigarettes, chewing tobacco, and e-cigarettes. If you need help quitting, ask your health care provider.  If you change the amount of nicotine or tobacco that you use, tell your health care provider. Your warfarin dosage may need to be changed.  Avoid street drugs while taking warfarin. The effects of street drugs on warfarin are not known. What do I need to know about warfarin and other medicines or  supplements?  Many prescription and over-the-counter medicines can interfere with warfarin. Talk with your health care provider or your pharmacist before starting or stopping any new medicines. This includes over-the-counter vitamins, dietary supplements, herbal medicines, and pain medicines. Your warfarin dosage may need to be adjusted.  Some common over-the-counter medicines that may increase the risk of bleeding while taking warfarin include:  Acetaminophen.  Aspirin.  NSAIDs, such as ibuprofen or naproxen.  Vitamin E. What do I need to know about warfarin and my diet?  It is important to maintain a normal, balanced diet while taking warfarin. Avoid major changes in your diet. If you are going to change your diet, talk with your health care provider before making changes.  Your health care provider may recommend that you work with a diet and nutrition specialist (dietitian).  Vitamin K decreases the effect of warfarin, and it is found in many foods. Eat a consistent amount of foods that contain vitamin K. For example, you may decide to eat 2 vitamin K-containing foods each day. Most foods that are high in vitamin K are green and leafy. Common foods that contain high amounts of vitamin K include:  Kale, raw or cooked.  Spinach, raw or cooked.  Collards, raw or cooked.  Swiss chard, raw or cooked.  Mustard greens, raw or cooked.  Turnip greens, raw or cooked.  Parsley, raw.  Broccoli, cooked.  Noodles, eggs, and spinach, enriched.  Brussels sprouts, raw or cooked.  Beet greens, raw or cooked.  Endive, raw.  Cabbage, cooked.  Asparagus, cooked. Foods that contain moderate amounts of vitamin K include:  Broccoli, raw.  Cabbage, raw.  Bok choy, cooked.  Green leaf lettuce, raw  Prunes, stewed.  Rosita Fire.  Kiwi.  Edamame, cooked.  Romaine lettuce, raw.  Avocado.  Tuna, canned in oil.  Okra, cooked.  Black-eyed peas, cooked.  Green beans, cooked  or raw.  Blueberries, raw.  Blackberries, raw.  Peas, cooked or raw. Contact a health care provider if:  You miss a dose.  You take an extra dose.  You plan to have any kind of surgery or procedure.  You are unable to take your medicine due to nausea, vomiting, or diarrhea.  You have any major changes in your diet or you plan to make any major changes in your diet.  You start or stop any over-the-counter medicine, prescription medicine, or dietary supplement.  You become pregnant, plan to become pregnant, or think you  may be pregnant.  You have menstrual periods that are heavier than usual.  You have unusual bruising. Get help right away if:  You develop symptoms of an allergic reaction, such as:  Swelling of the lips, face, tongue, mouth, or throat.  Rash.  Itching.  Itchy, red, swollen areas of skin (hives).  Trouble breathing.  Chest tightness.  You have:  Signs or symptoms of a stroke.  Signs or symptoms of a blood clot.  A fall or have an accident, especially if you hit your head.  Blood in your urine. Your urine may look reddish, pinkish, or tea-colored.  Blood in your stool. Your stool may be black or bright red.  Bleeding that does not stop after applying pressure to the area for 30 minutes.  Severe pain in your joints or back.  Purple or blue toes.  Skin ulcers that do not go away.  You vomit blood or cough up blood. The blood may be bright red, or it may look like coffee grounds. These symptoms may represent a serious problem that is an emergency. Do not wait to see if the symptoms will go away. Get medical help right away. Call your local emergency services (911 in the [place] Do not drive yourself to the hospital.  Summary  Warfarin needs to be closely monitored with blood tests. It is very important to keep all lab visits and follow-up visits with your health care provider.  Make sure that you know your target INR range and your warfarin  dosage.  Wear or carry identification that says that you are taking warfarin.  Take warfarin at the same time every day. Call your health care provider if you miss a dose or if you take an extra dose. Do not change the dosage of warfarin on your own.  Know the signs and symptoms of blood clots, bleeding, and a stroke. Know when to get emergency medical help.  Tell all health care providers who care for you that you are taking warfarin.  Talk with your health care provider or your pharmacist before starting or stopping any new medicines.  Monitor how much vitamin K you eat every day. Try to eat the same amount every day. This information is not intended to replace advice given to you by your health care provider. Make sure you discuss any questions you have with your health care provider. Document Released: 12/30/2004 Document Revised: 09/11/2015 Document Reviewed: 03/28/2015 Elsevier Interactive Patient Education  2017 [employer].

## 2016-01-24 NOTE — Addendum Note (Signed)
Addended by: Virl AxePATE INGALLS, Siren Porrata L on: 01/24/2016 08:43 AM   Modules accepted: Orders

## 2016-01-24 NOTE — Telephone Encounter (Signed)
Jacob Sanders refused to schedule all appointment today,cvrr,labs and Dr.Arida, Dr.Nishan is aware.

## 2016-01-24 NOTE — Progress Notes (Signed)
Cardiology Office Note   Date:  01/24/2016   ID:  Jacob Sanders, DOB 1945-03-03, MRN [id number]  PCP:  Jannifer Rodneyhristy Hawks, FNP  Cardiologist:   Charlton HawsPeter Nahum Sherrer, MD   Chief Complaint  Patient presents with  . Atrial Fibrillation      History of Present Illness: Jacob Sanders is a 71 y.o. male who presents for afib.  Seen as an add on during DOD day. During echo tech felt patient in afib which was a new diagnosis. He has recently seen Dr Kirke CorinArida for ? PAD. Pain in legs seems arthritic ABI's normal. Had normal myovue 01/17/15 as well no ischemia but echo ? 38% so echo ordered During echo patient in afib rate 105 Confirmed by ECG.   This patients CHA2DS2-VASc Score and unadjusted Ischemic Stroke Rate (% per year) is equal to 2.2 % stroke rate/year from a score of 2  Above score calculated as 1 point each if present [CHF, HTN, DM, Vascular=MI/PAD/Aortic Plaque, Age if 65-74, or Male] Above score calculated as 2 points each if present [Age > 75, or Stroke/TIA/TE]       Past Medical History:  Diagnosis Date  . Arthritis   . GERD (gastroesophageal reflux disease)   . Hyperlipidemia   . Hypertension     Past Surgical History:  Procedure Laterality Date  . SPINE SURGERY     Resulting staph infection     Current Outpatient Prescriptions  Medication Sig Dispense Refill  . Acetaminophen (TYLENOL ARTHRITIS PAIN PO) Take 1 tablet by mouth 3 (three) times daily as needed (pain).     . meloxicam (MOBIC) 15 MG tablet TAKE 1 TABLET BY MOUTH  DAILY 90 tablet 0  . omeprazole (PRILOSEC) 20 MG capsule TAKE 1 CAPSULE BY MOUTH  DAILY AS NEEDED 90 capsule 0  . diltiazem (CARDIZEM LA) 240 MG 24 hr tablet Take 1 tablet (240 mg total) by mouth daily. 90 tablet 3  . rivaroxaban (XARELTO) 20 MG TABS tablet Take 1 tablet (20 mg total) by mouth daily with supper. 90 tablet 3   No current facility-administered medications for this visit.     Allergies:   Prednisone    Social History:  The  patient  reports that he has been smoking Cigars.  He has a 15.00 pack-year smoking history. He has never used smokeless tobacco. He reports that he does not drink alcohol or use drugs.   Family History:  The patient's family history includes Aneurysm in his father.    ROS:  Please see the history of present illness.   Otherwise, review of systems are positive for none.   All other systems are reviewed and negative.    PHYSICAL EXAM: VS:  BP 126/90   Pulse 71   Ht 5\' 10"  (1.778 m)   Wt 183 lb (83 kg)   BMI 26.26 kg/m  , BMI Body mass index is 26.26 kg/m. Affect appropriate Healthy:  appears stated age HEENT: normal Neck supple with no adenopathy JVP normal no bruits no thyromegaly Lungs clear with no wheezing and good diaphragmatic motion Heart:  S1/S2 no murmur, no rub, gallop or click PMI normal Abdomen: benighn, BS positve, no tenderness, no AAA no bruit.  No HSM or HJR Distal pulses intact with no bruits No edema Neuro non-focal Skin warm and dry No muscular weakness    EKG:  AFib RBBB rate 101   Recent Labs: 04/06/2015: ALT 13; BUN 21; Creatinine, Ser 0.74; Potassium 4.4; Sodium 141  Lipid Panel No results found for: CHOL, TRIG, HDL, CHOLHDL, VLDL, LDLCALC, LDLDIRECT    Wt Readings from Last 3 Encounters:  01/24/16 183 lb (83 kg)  01/17/16 182 lb (82.6 kg)  12/18/15 182 lb (82.6 kg)      Other studies Reviewed: Additional studies/ records that were reviewed today include: notes Dr Kirke Corin ABI's , myovue echo and old ECG;s.    ASSESSMENT AND PLAN:  1.  Afib: CHADVAS 2 patient seems to have issues with finances. Not clear to me he will take NOAC Will change norvasc to cardizem for better rate control BMET/CBC PLT to make sure ok to start blood Thinner. Will have him f/u in afib clinic and with Dr Vonzella Nipple is asymptomatic may be best to rate control Especially if he won't take anticoagulation.  Only mild LAE 41 mm on Echo 2. Leg Pain: not PVD pulses  ok tylenol for pain if on anticoagulation 3. HTN:  Change norvasc to cardizem    Current medicines are reviewed at length with the patient today.  The patient does not have concerns regarding medicines.  The following changes have been made:  Xarelto , Cardizem  Labs/ tests ordered today include:   Orders Placed This Encounter  Procedures  . Basic metabolic panel  . CBC with Differential/Platelet  . EKG 12-Lead     Disposition:   FU with Arida and afib clinic      Signed, Charlton Haws, MD  01/24/2016 8:35 AM    [facility] [address] [place], [place], [place]  [address] Phone: [phone]; Fax: [phone]

## 2016-02-27 ENCOUNTER — Ambulatory Visit (INDEPENDENT_AMBULATORY_CARE_PROVIDER_SITE_OTHER): Payer: Medicare Other | Admitting: Physician Assistant

## 2016-02-27 ENCOUNTER — Encounter: Payer: Self-pay | Admitting: Physician Assistant

## 2016-02-27 VITALS — BP 107/83 | HR 68 | Temp 98.1°F | Ht 70.0 in | Wt 182.0 lb

## 2016-02-27 DIAGNOSIS — M79605 Pain in left leg: Secondary | ICD-10-CM | POA: Diagnosis not present

## 2016-02-27 DIAGNOSIS — Z8679 Personal history of other diseases of the circulatory system: Secondary | ICD-10-CM | POA: Diagnosis not present

## 2016-02-27 DIAGNOSIS — M79604 Pain in right leg: Secondary | ICD-10-CM | POA: Insufficient documentation

## 2016-02-27 DIAGNOSIS — I10 Essential (primary) hypertension: Secondary | ICD-10-CM | POA: Diagnosis not present

## 2016-02-27 DIAGNOSIS — R000 Tachycardia, unspecified: Secondary | ICD-10-CM | POA: Insufficient documentation

## 2016-02-27 DIAGNOSIS — M170 Bilateral primary osteoarthritis of knee: Secondary | ICD-10-CM

## 2016-02-27 MED ORDER — ASPIRIN 325 MG PO TBEC: 325.0000 mg | DELAYED_RELEASE_TABLET | Freq: Every day | ORAL | Status: DC

## 2016-02-27 MED ORDER — METHYLPREDNISOLONE ACETATE 80 MG/ML IJ SUSP
80.0000 mg | Freq: Once | INTRAMUSCULAR | Status: AC
  Administered 2016-02-27: 80 mg via INTRAMUSCULAR

## 2016-02-27 NOTE — Patient Instructions (Signed)
Arthritis Introduction Arthritis means joint pain. It can also mean joint disease. A joint is a place where bones come together. People who have arthritis may have:  Red joints.  Swollen joints.  Stiff joints.  Warm joints.  A fever.  A feeling of being sick. Follow these instructions at home: Pay attention to any changes in your symptoms. Take these actions to help with your pain and swelling. Medicines  Take over-the-counter and prescription medicines only as told by your doctor.  Do not take aspirin for pain if your doctor says that you may have gout. Activity  Rest your joint if your doctor tells you to.  Avoid activities that make the pain worse.  Exercise your joint regularly as told by your doctor. Try doing exercises like:  Swimming.  Water aerobics.  Biking.  Walking. Joint Care   If your joint is swollen, keep it raised (elevated) if told by your doctor.  If your joint feels stiff in the morning, try taking a warm shower.  If you have diabetes, do not apply heat without asking your doctor.  If told, apply heat to the joint:  Put a towel between the joint and the hot pack or heating pad.  Leave the heat on the area for 20-30 minutes.  If told, apply ice to the joint:  Put ice in a plastic bag.  Place a towel between your skin and the bag.  Leave the ice on for 20 minutes, 2-3 times per day.  Keep all follow-up visits as told by your doctor. Contact a doctor if:  The pain gets worse.  You have a fever. Get help right away if:  You have very bad pain in your joint.  You have swelling in your joint.  Your joint is red.  Many joints become painful and swollen.  You have very bad back pain.  Your leg is very weak.  You cannot control your pee (urine) or poop (stool). This information is not intended to replace advice given to you by your health care provider. Make sure you discuss any questions you have with your health care  provider. Document Released: 03/26/2009 Document Revised: 06/07/2015 Document Reviewed: 03/27/2014  2017 Elsevier  

## 2016-02-27 NOTE — Progress Notes (Addendum)
BP 107/83   Pulse 68   Temp 98.1 F (36.7 C) (Oral)   Ht 5\' 10"  (1.778 m)   Wt 182 lb (82.6 kg)   BMI 26.11 kg/m    Subjective:    Patient ID: Jacob Dakinshurman E Herberger, male    DOB: January 30, 1945, 71 y.o.   MRN: [id number]  HPI: Jacob Sanders is a 71 y.o. male presenting on 02/27/2016 for Leg Pain (Bilateral  )  The patient was seen by Dr. Eden EmmsNishan in January 2018 and EKG had findings of rapid ventricular response with atrial fibrillation. According to the patient he was told to start taking warfarin and diltiazem and to discontinue his amlodipine. However the patient states he is not, start these medications. He has been taking his amlodipine and aspirin alone. I have left the diltiazem and Coumadin on the med list but with the status of not taking. We will let him resolve this with his primary care provider when she is back in town.   As for today he is here for severe leg and knee pain. He has pain in the hips, low back and hen to the legs. Also pain in both knees. Known Ra.   Relevant past medical, surgical, family and social history reviewed and updated as indicated. Allergies and medications reviewed and updated.  Past Medical History:  Diagnosis Date  . Arthritis   . GERD (gastroesophageal reflux disease)   . Hyperlipidemia   . Hypertension     Past Surgical History:  Procedure Laterality Date  . SPINE SURGERY     Resulting staph infection    Review of Systems  Constitutional: Positive for fatigue. Negative for appetite change.  HENT: Negative.   Eyes: Negative.  Negative for pain and visual disturbance.  Respiratory: Negative.  Negative for cough, chest tightness, shortness of breath and wheezing.   Cardiovascular: Negative.  Negative for chest pain, palpitations and leg swelling.  Gastrointestinal: Negative.  Negative for abdominal pain, diarrhea, nausea and vomiting.  Endocrine: Negative.   Genitourinary: Negative.   Musculoskeletal: Positive for arthralgias, back pain,  gait problem and joint swelling.  Skin: Negative.  Negative for color change and rash.  Neurological: Negative for weakness, numbness and headaches.  Psychiatric/Behavioral: Negative.     Allergies as of 02/27/2016      Reactions   Prednisone Swelling      Medication List       Accurate as of 02/27/16 11:59 PM. Always use your most recent med list.          amLODipine 10 MG tablet Commonly known as:  NORVASC Take 10 mg by mouth daily.   aspirin 325 MG EC tablet Take 1 tablet (325 mg total) by mouth daily.   diltiazem 240 MG 24 hr tablet Commonly known as:  CARDIZEM LA Take 1 tablet (240 mg total) by mouth daily.   meloxicam 15 MG tablet Commonly known as:  MOBIC TAKE 1 TABLET BY MOUTH  DAILY   omeprazole 20 MG capsule Commonly known as:  PRILOSEC TAKE 1 CAPSULE BY MOUTH  DAILY AS NEEDED   TYLENOL ARTHRITIS PAIN PO Take 1 tablet by mouth 3 (three) times daily as needed (pain).   warfarin 5 MG tablet Commonly known as:  COUMADIN Take 1 tablet (5 mg total) by mouth daily.          Objective:    BP 107/83   Pulse 68   Temp 98.1 F (36.7 C) (Oral)   Ht 5\' 10"  (1.778 m)  Wt 182 lb (82.6 kg)   BMI 26.11 kg/m   Allergies  Allergen Reactions  . Prednisone Swelling    Physical Exam  Constitutional: He appears well-developed and well-nourished.  HENT:  Head: Normocephalic and atraumatic.  Eyes: Conjunctivae and EOM are normal. Pupils are equal, round, and reactive to light.  Neck: Normal range of motion. Neck supple.  Cardiovascular: Normal rate, normal heart sounds and normal pulses.  An irregularly irregular rhythm present.  Extrasystoles are present. Exam reveals no S3 and no S4.   No murmur heard. Pulmonary/Chest: Effort normal and breath sounds normal.  Abdominal: Soft. Bowel sounds are normal.  Musculoskeletal: Normal range of motion. He exhibits edema, tenderness and deformity.  Skin: Skin is warm and dry.        Assessment & Plan:   1.  Pain in both lower extremities - methylPREDNISolone acetate (DEPO-MEDROL) injection 80 mg; Inject 1 mL (80 mg total) into the muscle once.  2. Essential hypertension, benign  3. Tachycardia  4. History of atrial fibrillation without current medication - aspirin EC 325 MG EC tablet; Take 1 tablet (325 mg total) by mouth daily.  Dispense: 30 tablet Advise to look into treatment for this, talk with Jannifer Rodney his PCP about this.  5. Primary osteoarthritis of both knees - methylPREDNISolone acetate (DEPO-MEDROL) injection 80 mg; Inject 1 mL (80 mg total) into the muscle once.   Continue all other maintenance medications as listed above.  Follow up plan: Return in about 2 weeks (around 03/12/2016) for recheck, CHRISTY HAWKS.  Educational handout given for arthritis  Remus Loffler PA-C [facility] [address]  [place], [place] [address] [phone]   03/02/2016, 6:07 PM \

## 2016-03-12 ENCOUNTER — Telehealth: Payer: Self-pay | Admitting: Cardiovascular Disease

## 2016-03-12 NOTE — Telephone Encounter (Signed)
New Message   Per pt wife never received the results from patients myocardial perfusion. Requesting call back

## 2016-03-12 NOTE — Telephone Encounter (Signed)
Pt wife notified of all results

## 2016-03-13 DIAGNOSIS — M1611 Unilateral primary osteoarthritis, right hip: Secondary | ICD-10-CM | POA: Diagnosis not present

## 2016-03-13 DIAGNOSIS — M1612 Unilateral primary osteoarthritis, left hip: Secondary | ICD-10-CM | POA: Diagnosis not present

## 2016-03-19 ENCOUNTER — Telehealth: Payer: Self-pay

## 2016-03-19 NOTE — Telephone Encounter (Signed)
I attempted to reach the pt by phone but not answer at this time.  [facility]. The pt will require a follow-up office visit with Cardiology to discuss clearance.  Per review of chart the pt had echo on 01/24/16 which showed new AFib and the pt was evaluated by Dr Eden EmmsNishan see plan:  ASSESSMENT AND PLAN:  1.  Afib: CHADVAS 2 patient seems to have issues with finances. Not clear to me he will take NOAC Will change norvasc to cardizem for better rate control BMET/CBC PLT to make sure ok to start blood Thinner. Will have him f/u in afib clinic and with Dr Vonzella NippleArida  Afib is asymptomatic may be best to rate control Especially if he won't take anticoagulation.  Only mild LAE 41 mm on Echo 2. Leg Pain: not PVD pulses ok tylenol for pain if on anticoagulation 3. HTN:  Change norvasc to cardizem   Patient Instructions   Medication Instructions:  Your physician has recommended you make the following change in your medication:  1-STOP amlodipine  2-START Cardizem 240 mg by mouth daily  3-START Coumadin 5 mg by mouth daily  Labwork: Your physician recommends that you have lab work today- CBC and BMET  Testing/Procedures: NONE  Follow-Up: Your physician recommends that you schedule a follow-up appointment with [facility] as a new start on Tuesday  Your physician recommends that you schedule a follow-up appointment with Dr. Kirke CorinArida after Coumadin Clinic.     When the pt went to check out that day he refused to make any follow-up appointments. The pt has followed up with Primary Care 02/27/16 and made them aware that he did not make recommended medication changes.  The pt has a pending appointment with PCP 03/21/16 to discuss surgical clearance.

## 2016-03-21 ENCOUNTER — Ambulatory Visit (INDEPENDENT_AMBULATORY_CARE_PROVIDER_SITE_OTHER): Payer: Medicare Other | Admitting: Family

## 2016-03-21 ENCOUNTER — Ambulatory Visit (INDEPENDENT_AMBULATORY_CARE_PROVIDER_SITE_OTHER): Payer: Medicare Other

## 2016-03-21 ENCOUNTER — Encounter: Payer: Self-pay | Admitting: Family

## 2016-03-21 VITALS — BP 140/102 | HR 75 | Temp 97.1°F | Ht 70.0 in | Wt 185.2 lb

## 2016-03-21 DIAGNOSIS — Z1211 Encounter for screening for malignant neoplasm of colon: Secondary | ICD-10-CM

## 2016-03-21 DIAGNOSIS — Z01810 Encounter for preprocedural cardiovascular examination: Secondary | ICD-10-CM

## 2016-03-21 DIAGNOSIS — I10 Essential (primary) hypertension: Secondary | ICD-10-CM

## 2016-03-21 DIAGNOSIS — M1990 Unspecified osteoarthritis, unspecified site: Secondary | ICD-10-CM

## 2016-03-21 DIAGNOSIS — F17200 Nicotine dependence, unspecified, uncomplicated: Secondary | ICD-10-CM | POA: Diagnosis not present

## 2016-03-21 DIAGNOSIS — I4891 Unspecified atrial fibrillation: Secondary | ICD-10-CM | POA: Diagnosis not present

## 2016-03-21 DIAGNOSIS — E663 Overweight: Secondary | ICD-10-CM

## 2016-03-21 IMAGING — DX DG CHEST 2V
2 series · 2 of 2 positions shown · non-contrast
Comparison: None.

CLINICAL DATA: Preoperative evaluation for upcoming hip surgery

EXAM:
CHEST  2 VIEW

[chest pa]
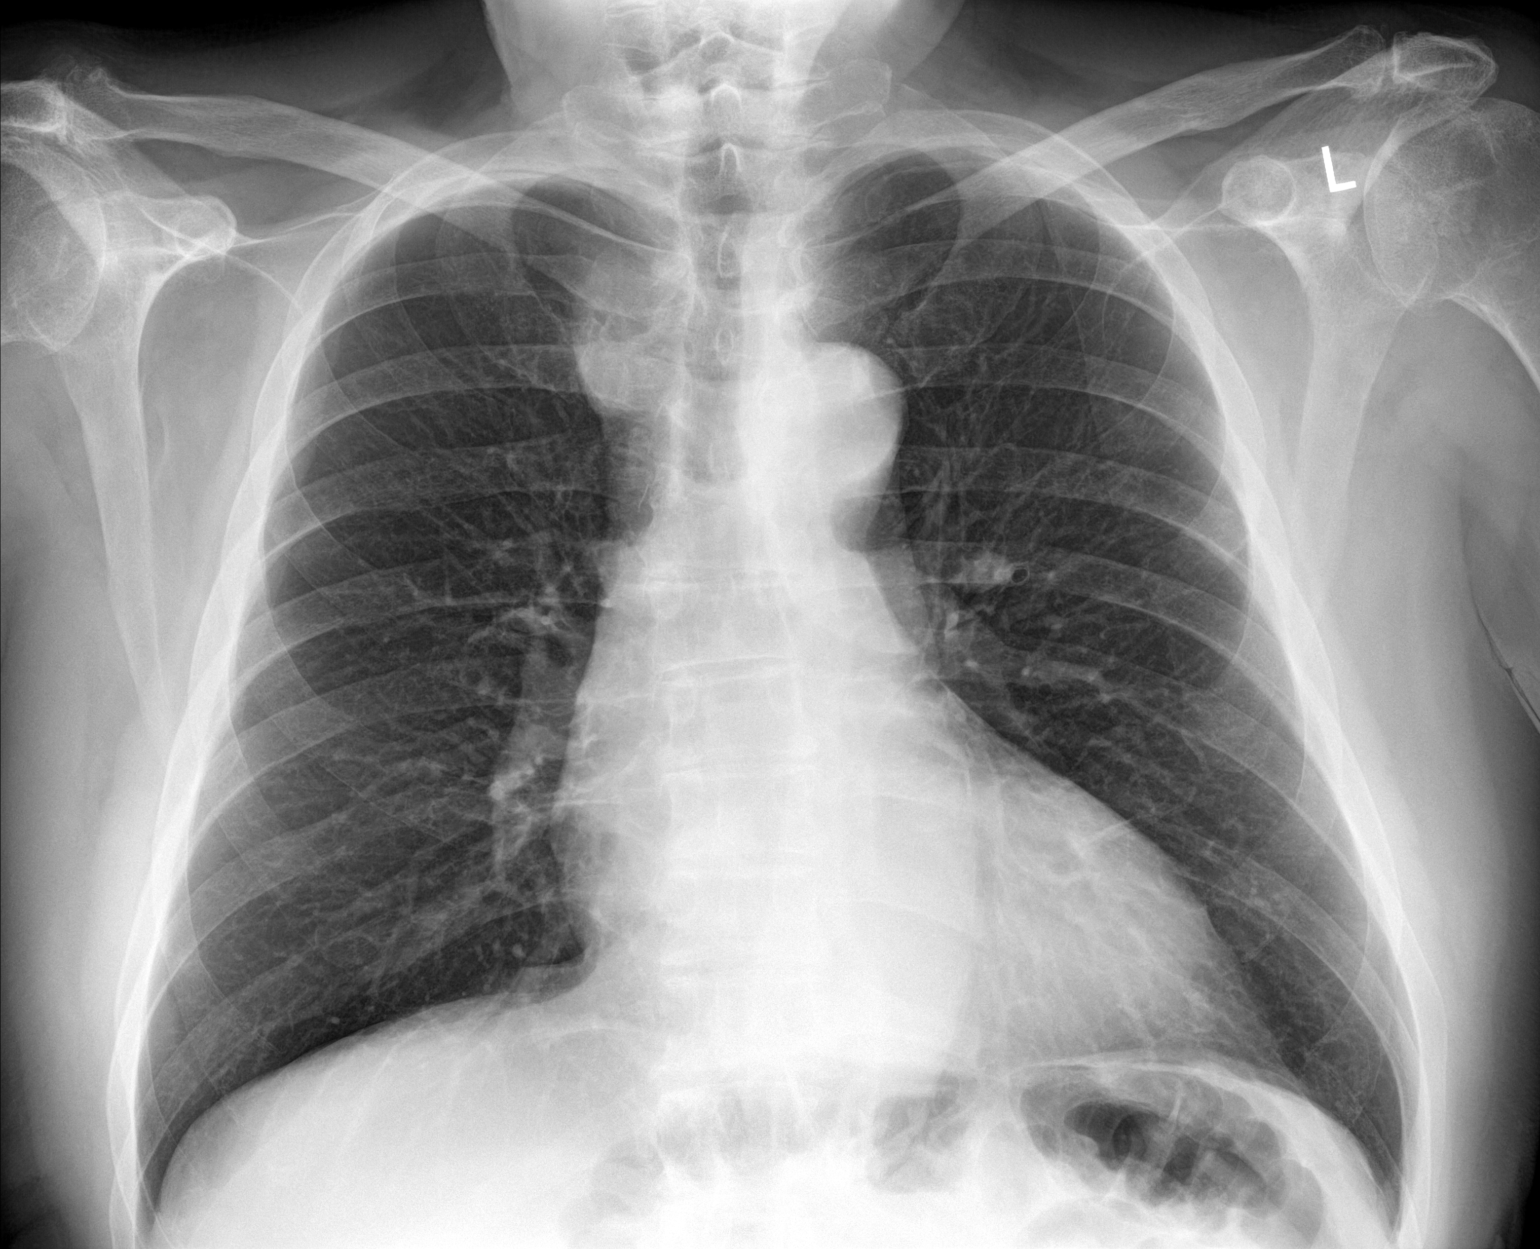

[chest lat]
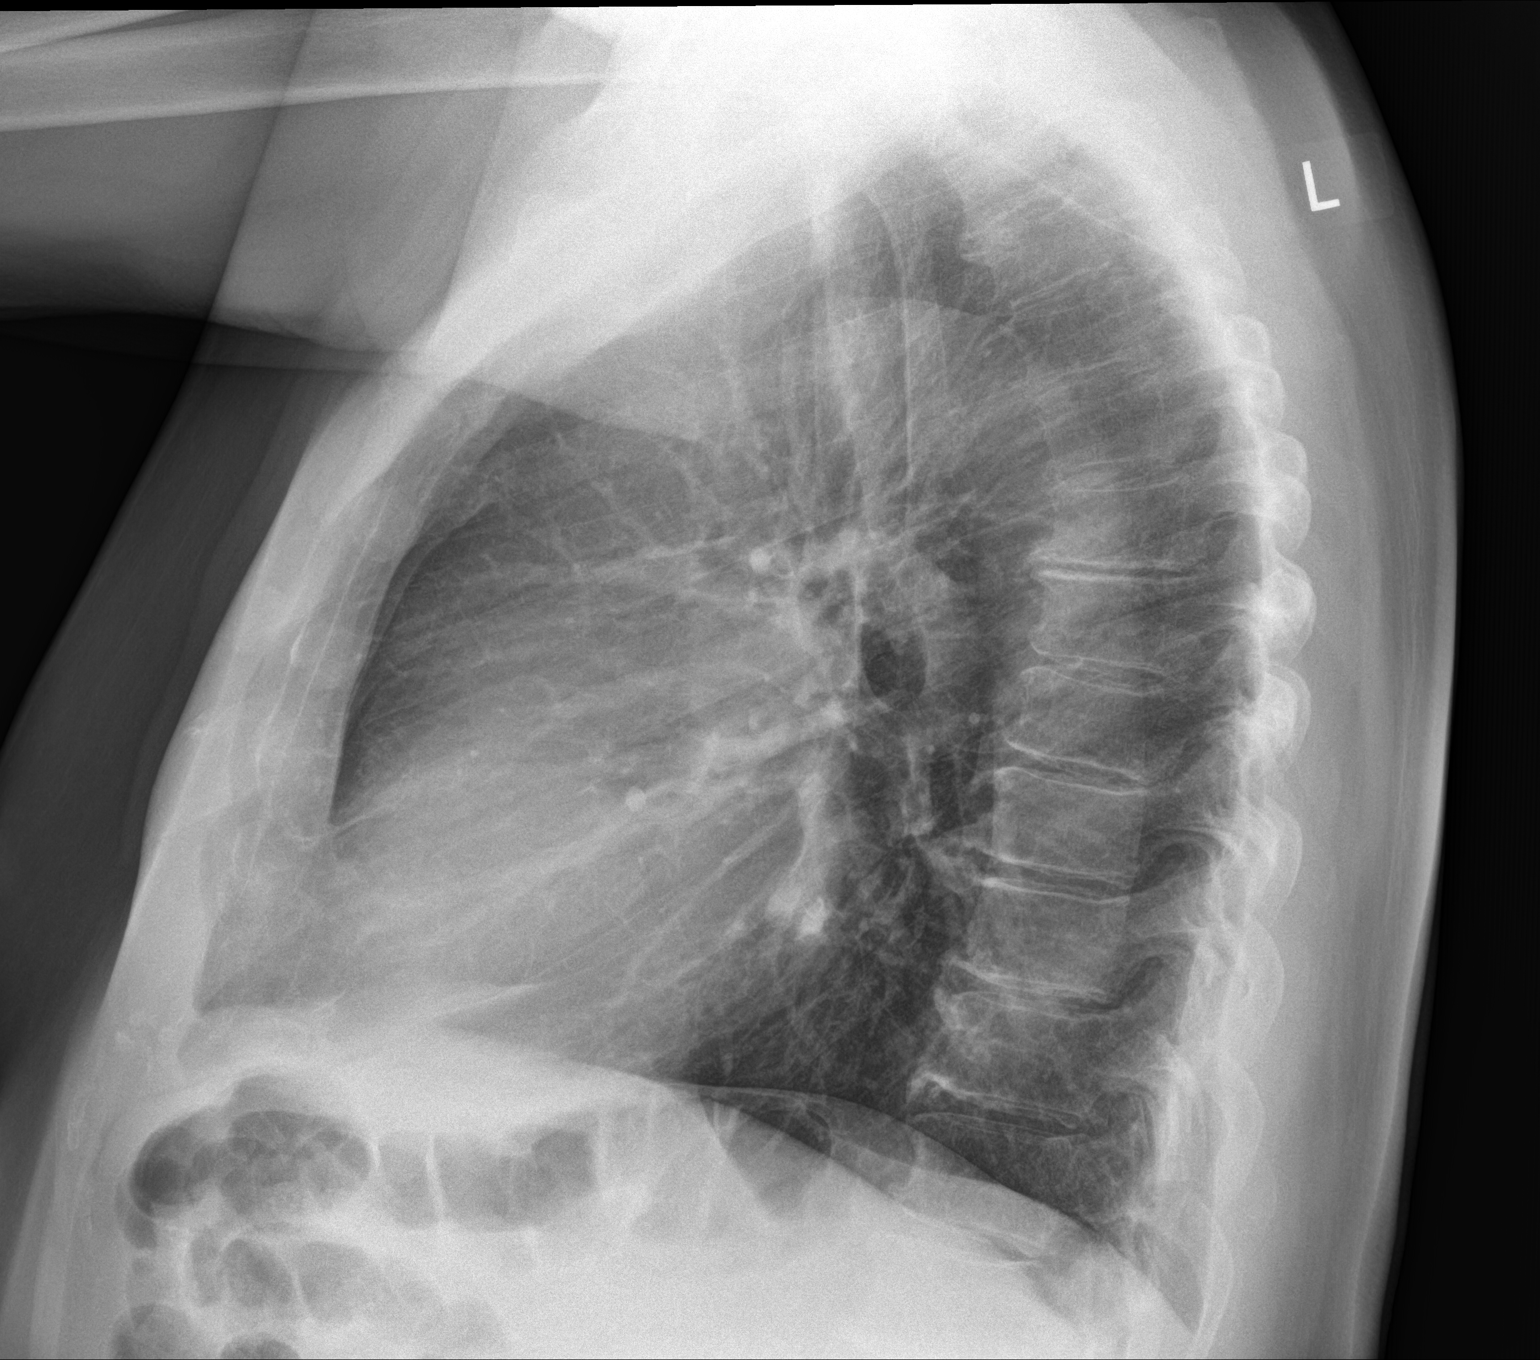

[2 of 2 positions shown; findings below may reference images not displayed]

FINDINGS: The heart size and mediastinal contours are within normal limits.
Both lungs are clear. The visualized skeletal structures are
unremarkable.
IMPRESSION: No active cardiopulmonary disease.

## 2016-03-21 MED ORDER — DILTIAZEM HCL ER COATED BEADS 240 MG PO TB24: 240.0000 mg | ORAL_TABLET | Freq: Every day | ORAL | 3 refills | Status: DC

## 2016-03-21 MED ORDER — WARFARIN SODIUM 5 MG PO TABS: 5.0000 mg | ORAL_TABLET | Freq: Every day | ORAL | 1 refills | Status: DC

## 2016-03-21 NOTE — Progress Notes (Signed)
   Subjective:    Patient ID: Jacob Sanders, male    DOB: 21-Jun-1945, 71 y.o.   MRN: [id number]  HPI Pt presents to the office today for surgical clearance for left hip surgery. PT was seen by a cardiologists and was diagnosed with A Fib. PT had a negative Echo.   PT was told to stop norvasc and start Cardizem and warfarin. PT states he has not started either. States his current blood pressure. PT does not want to start warfarin because it is "rat poison". He has also stopped his aspirin because it has caused bruising.     Review of Systems  Musculoskeletal: Positive for arthralgias.  Hematological: Bruises/bleeds easily.  All other systems reviewed and are negative.      Objective:   Physical Exam  Constitutional: He is oriented to person, place, and time. He appears well-developed and well-nourished. No distress.  HENT:  Head: Normocephalic.  Right Ear: External ear normal.  Left Ear: External ear normal.  Nose: Nose normal.  Mouth/Throat: Oropharynx is clear and moist.  Eyes: Pupils are equal, round, and reactive to light. Right eye exhibits no discharge. Left eye exhibits no discharge.  Neck: Normal range of motion. Neck supple. No thyromegaly present.  Cardiovascular: Normal heart sounds and intact distal pulses.  An irregularly irregular rhythm present. Tachycardia present.   No murmur heard. Pulmonary/Chest: Effort normal. No respiratory distress. He has decreased breath sounds. He has no wheezes.  Abdominal: Soft. Bowel sounds are normal. He exhibits no distension. There is no tenderness.  Musculoskeletal: He exhibits no edema or tenderness.  Left hip pain with rotation    Neurological: He is alert and oriented to person, place, and time.  Skin: Skin is warm and dry. No rash noted. No erythema.  Psychiatric: He has a normal mood and affect. His behavior is normal. Judgment and thought content normal.  Vitals reviewed.  Chest x-ray- Negative Preliminary reading by  Jannifer Rodneyhristy Landry Kamath, FNP [facility]    BP (!) 140/102   Pulse 75   Temp 97.1 F (36.2 C) (Oral)   Ht 5\' 10"  (1.778 m)   Wt 185 lb 3.2 oz (84 kg)   BMI 26.57 kg/m      Assessment & Plan:  1. Encounter for pre-operative cardiovascular clearance - EKG 12-Lead - DG Chest 2 View; Future  2. Atrial fibrillation, unspecified type (HCC) -Long discussion with patient about starting medications Cardiologists started _Pt has agreed as long as he can afford them -He will make follow up appt with Cardiologists -He wants to get his INR checked her [facility] to make appt with Tammy - warfarin (COUMADIN) 5 MG tablet; Take 1 tablet (5 mg total) by mouth daily.  Dispense: 30 tablet; Refill: 1 - diltiazem (CARDIZEM LA) 240 MG 24 hr tablet; Take 1 tablet (240 mg total) by mouth daily.  Dispense: 90 tablet; Refill: 3  3. Current smoker -Smoking cessation discussed  4. Arthritis -Pt will need to stop mobic  5. Essential hypertension, benign Stop Norvasc and start cardizem 240 mg   CBC and CMP pending Pt to follow up with Tammy on Tuesday or Wednesday  Jannifer Rodneyhristy Dorleen Kissel, FNP

## 2016-03-21 NOTE — Patient Instructions (Signed)
Atrial Fibrillation Atrial fibrillation is a type of irregular or rapid heartbeat (arrhythmia). In atrial fibrillation, the heart quivers continuously in a chaotic pattern. This occurs when parts of the heart receive disorganized signals that make the heart unable to pump blood normally. This can increase the risk for stroke, heart failure, and other heart-related conditions. There are different types of atrial fibrillation, including:  Paroxysmal atrial fibrillation. This type starts suddenly, and it usually stops on its own shortly after it starts.  Persistent atrial fibrillation. This type often lasts longer than a week. It may stop on its own or with treatment.  Long-lasting persistent atrial fibrillation. This type lasts longer than 12 months.  Permanent atrial fibrillation. This type does not go away.  Talk with your health care provider to learn about the type of atrial fibrillation that you have. What are the causes? This condition is caused by some heart-related conditions or procedures, including:  A heart attack.  Coronary artery disease.  Heart failure.  Heart valve conditions.  High blood pressure.  Inflammation of the sac that surrounds the heart (pericarditis).  Heart surgery.  Certain heart rhythm disorders, such as Wolf-Parkinson-White syndrome.  Other causes include:  Pneumonia.  Obstructive sleep apnea.  Blockage of an artery in the lungs (pulmonary embolism, or PE).  Lung cancer.  Chronic lung disease.  Thyroid problems, especially if the thyroid is overactive (hyperthyroidism).  Caffeine.  Excessive alcohol use or illegal drug use.  Use of some medicines, including certain decongestants and diet pills.  Sometimes, the cause cannot be found. What increases the risk? This condition is more likely to develop in:  People who are older in age.  People who smoke.  People who have diabetes mellitus.  People who are overweight  (obese).  Athletes who exercise vigorously.  What are the signs or symptoms? Symptoms of this condition include:  A feeling that your heart is beating rapidly or irregularly.  A feeling of discomfort or pain in your chest.  Shortness of breath.  Sudden light-headedness or weakness.  Getting tired easily during exercise.  In some cases, there are no symptoms. How is this diagnosed? Your health care provider may be able to detect atrial fibrillation when taking your pulse. If detected, this condition may be diagnosed with:  An electrocardiogram (ECG).  A Holter monitor test that records your heartbeat patterns over a 24-hour period.  Transthoracic echocardiogram (TTE) to evaluate how blood flows through your heart.  Transesophageal echocardiogram (TEE) to view more detailed images of your heart.  A stress test.  Imaging tests, such as a CT scan or chest X-ray.  Blood tests.  How is this treated? The main goals of treatment are to prevent blood clots from forming and to keep your heart beating at a normal rate and rhythm. The type of treatment that you receive depends on many factors, such as your underlying medical conditions and how you feel when you are experiencing atrial fibrillation. This condition may be treated with:  Medicine to slow down the heart rate, bring the heart's rhythm back to normal, or prevent clots from forming.  Electrical cardioversion. This is a procedure that resets your heart's rhythm by delivering a controlled, low-energy shock to the heart through your skin.  Different types of ablation, such as catheter ablation, catheter ablation with pacemaker, or surgical ablation. These procedures destroy the heart tissues that send abnormal signals. When the pacemaker is used, it is placed under your skin to help your heart beat in   a regular rhythm.  Follow these instructions at home:  Take over-the counter and prescription medicines only as told by your  health care provider.  If your health care provider prescribed a blood-thinning medicine (anticoagulant), take it exactly as told. Taking too much blood-thinning medicine can cause bleeding. If you do not take enough blood-thinning medicine, you will not have the protection that you need against stroke and other problems.  Do not use tobacco products, including cigarettes, chewing tobacco, and e-cigarettes. If you need help quitting, ask your health care provider.  If you have obstructive sleep apnea, manage your condition as told by your health care provider.  Do not drink alcohol.  Do not drink beverages that contain caffeine, such as coffee, soda, and tea.  Maintain a healthy weight. Do not use diet pills unless your health care provider approves. Diet pills may make heart problems worse.  Follow diet instructions as told by your health care provider.  Exercise regularly as told by your health care provider.  Keep all follow-up visits as told by your health care provider. This is important. How is this prevented?  Avoid drinking beverages that contain caffeine or alcohol.  Avoid certain medicines, especially medicines that are used for breathing problems.  Avoid certain herbs and herbal medicines, such as those that contain ephedra or ginseng.  Do not use illegal drugs, such as cocaine and amphetamines.  Do not smoke.  Manage your high blood pressure. Contact a health care provider if:  You notice a change in the rate, rhythm, or strength of your heartbeat.  You are taking an anticoagulant and you notice increased bruising.  You tire more easily when you exercise or exert yourself. Get help right away if:  You have chest pain, abdominal pain, sweating, or weakness.  You feel nauseous.  You notice blood in your vomit, bowel movement, or urine.  You have shortness of breath.  You suddenly have swollen feet and ankles.  You feel dizzy.  You have sudden weakness or  numbness of the face, arm, or leg, especially on one side of the body.  You have trouble speaking, trouble understanding, or both (aphasia).  Your face or your eyelid droops on one side. These symptoms may represent a serious problem that is an emergency. Do not wait to see if the symptoms will go away. Get medical help right away. Call your local emergency services (911 in the [place] Do not drive yourself to the hospital. This information is not intended to replace advice given to you by your health care provider. Make sure you discuss any questions you have with your health care provider. Document Released: 12/30/2004 Document Revised: 05/09/2015 Document Reviewed: 04/26/2014 Elsevier Interactive Patient Education  2017 [employer]  

## 2016-03-22 LAB — CBC WITH DIFFERENTIAL/PLATELET
BASOS ABS: 0 10*3/uL (ref 0.0–0.2)
Basos: 0 %
EOS (ABSOLUTE): 0.1 10*3/uL (ref 0.0–0.4)
Eos: 1 %
HEMOGLOBIN: 17.1 g/dL (ref 13.0–17.7)
Hematocrit: 49.1 % (ref 37.5–51.0)
IMMATURE GRANS (ABS): 0 10*3/uL (ref 0.0–0.1)
IMMATURE GRANULOCYTES: 0 %
LYMPHS: 19 %
Lymphocytes Absolute: 1.9 10*3/uL (ref 0.7–3.1)
MCH: 33.3 pg — ABNORMAL HIGH (ref 26.6–33.0)
MCHC: 34.8 g/dL (ref 31.5–35.7)
MCV: 96 fL (ref 79–97)
MONOCYTES: 8 %
Monocytes Absolute: 0.8 10*3/uL (ref 0.1–0.9)
NEUTROS PCT: 72 %
Neutrophils Absolute: 7.2 10*3/uL — ABNORMAL HIGH (ref 1.4–7.0)
Platelets: 242 10*3/uL (ref 150–379)
RBC: 5.14 x10E6/uL (ref 4.14–5.80)
RDW: 13.8 % (ref 12.3–15.4)
WBC: 10 10*3/uL (ref 3.4–10.8)

## 2016-03-22 LAB — CMP14+EGFR
ALT: 13 IU/L (ref 0–44)
AST: 12 IU/L (ref 0–40)
Albumin/Globulin Ratio: 1.6 (ref 1.2–2.2)
Albumin: 4.2 g/dL (ref 3.6–4.8)
Alkaline Phosphatase: 87 IU/L (ref 39–117)
BILIRUBIN TOTAL: 0.5 mg/dL (ref 0.0–1.2)
BUN/Creatinine Ratio: 23 (ref 10–24)
BUN: 21 mg/dL (ref 8–27)
CALCIUM: 9.6 mg/dL (ref 8.6–10.2)
CHLORIDE: 102 mmol/L (ref 96–106)
CO2: 23 mmol/L (ref 18–29)
Creatinine, Ser: 0.93 mg/dL (ref 0.76–1.27)
GFR calc Af Amer: 97 mL/min/{1.73_m2} (ref >59)
GFR calc non Af Amer: 84 mL/min/{1.73_m2} (ref >59)
GLUCOSE: 104 mg/dL — AB (ref 65–99)
Globulin, Total: 2.7 g/dL (ref 1.5–4.5)
POTASSIUM: 4.2 mmol/L (ref 3.5–5.2)
Sodium: 144 mmol/L (ref 134–144)
TOTAL PROTEIN: 6.9 g/dL (ref 6.0–8.5)

## 2016-03-25 ENCOUNTER — Other Ambulatory Visit: Payer: Self-pay | Admitting: Family

## 2016-03-25 ENCOUNTER — Ambulatory Visit: Payer: Medicare Other | Admitting: Pharmacist

## 2016-03-27 ENCOUNTER — Encounter: Payer: Self-pay | Admitting: Family

## 2016-03-27 ENCOUNTER — Ambulatory Visit (INDEPENDENT_AMBULATORY_CARE_PROVIDER_SITE_OTHER): Payer: Medicare Other | Admitting: Family

## 2016-03-27 ENCOUNTER — Encounter: Payer: Medicare Other | Admitting: Pharmacist

## 2016-03-27 VITALS — BP 124/89 | HR 54 | Temp 97.4°F | Ht 70.0 in | Wt 183.2 lb

## 2016-03-27 DIAGNOSIS — Z1211 Encounter for screening for malignant neoplasm of colon: Secondary | ICD-10-CM | POA: Diagnosis not present

## 2016-03-27 DIAGNOSIS — M160 Bilateral primary osteoarthritis of hip: Secondary | ICD-10-CM | POA: Diagnosis not present

## 2016-03-27 DIAGNOSIS — D699 Hemorrhagic condition, unspecified: Secondary | ICD-10-CM | POA: Diagnosis not present

## 2016-03-27 DIAGNOSIS — M170 Bilateral primary osteoarthritis of knee: Secondary | ICD-10-CM

## 2016-03-27 LAB — COAGUCHEK XS/INR WAIVED
INR: 2.9 — AB (ref 0.9–1.1)
Prothrombin Time: 34.9 s

## 2016-03-27 LAB — HGB A1C W/O EAG: HEMOGLOBIN A1C: 5.5 % (ref 4.8–5.6)

## 2016-03-27 LAB — SPECIMEN STATUS REPORT

## 2016-03-27 MED ORDER — TRAMADOL HCL 50 MG PO TABS: 50.0000 mg | ORAL_TABLET | Freq: Two times a day (BID) | ORAL | 2 refills | Status: DC | PRN

## 2016-03-27 NOTE — Addendum Note (Signed)
Addended by: Margorie JohnJOHNSON, Alejandra Barna M on: 03/27/2016 10:51 AM   Modules accepted: Orders

## 2016-03-27 NOTE — Patient Instructions (Signed)
Osteoarthritis  Osteoarthritis is a type of arthritis that affects tissue that covers the ends of bones in joints (cartilage). Cartilage acts as a cushion between the bones and helps them move smoothly. Osteoarthritis results when cartilage in the joints gets worn down. Osteoarthritis is sometimes called "wear and tear" arthritis.  Osteoarthritis is the most common form of arthritis. It often occurs in older people. It is a condition that gets worse over time (a progressive condition). Joints that are most often affected by this condition are in:  · Fingers.  · Toes.  · Hips.  · Knees.  · Spine, including neck and lower back.    What are the causes?  This condition is caused by age-related wearing down of cartilage that covers the ends of bones.  What increases the risk?  The following factors may make you more likely to develop this condition:  · Older age.  · Being overweight or obese.  · Overuse of joints, such as in athletes.  · Past injury of a joint.  · Past surgery on a joint.  · Family history of osteoarthritis.    What are the signs or symptoms?  The main symptoms of this condition are pain, swelling, and stiffness in the joint. The joint may lose its shape over time. Small pieces of bone or cartilage may break off and float inside of the joint, which may cause more pain and damage to the joint. Small deposits of bone (osteophytes) may grow on the edges of the joint. Other symptoms may include:  · A grating or scraping feeling inside the joint when you move it.  · Popping or creaking sounds when you move.    Symptoms may affect one or more joints. Osteoarthritis in a major joint, such as your knee or hip, can make it painful to walk or exercise. If you have osteoarthritis in your hands, you might not be able to grip items, twist your hand, or control small movements of your hands and fingers (fine motor skills).  How is this diagnosed?  This condition may be diagnosed based on:  · Your medical history.  · A  physical exam.  · Your symptoms.  · X-rays of the affected joint(s).  · Blood tests to rule out other types of arthritis.    How is this treated?  There is no cure for this condition, but treatment can help to control pain and improve joint function. Treatment plans may include:  · A prescribed exercise program that allows for rest and joint relief. You may work with a physical therapist.  · A weight control plan.  · Pain relief techniques, such as:  ? Applying heat and cold to the joint.  ? Electric pulses delivered to nerve endings under the skin (transcutaneous electrical nerve stimulation, or TENS).  ? Massage.  ? Certain nutritional supplements.  · NSAIDs or prescription medicines to help relieve pain.  · Medicine to help relieve pain and inflammation (corticosteroids). This can be given by mouth (orally) or as an injection.  · Assistive devices, such as a brace, wrap, splint, specialized glove, or cane.  · Surgery, such as:  ? An osteotomy. This is done to reposition the bones and relieve pain or to remove loose pieces of bone and cartilage.  ? Joint replacement surgery. You may need this surgery if you have very bad (advanced) osteoarthritis.    Follow these instructions at home:  Activity   · Rest your affected joints as directed by your   health care provider.  · Do not drive or use heavy machinery while taking prescription pain medicine.  · Exercise as directed. Your health care provider or physical therapist may recommend specific types of exercise, such as:  ? Strengthening exercises. These are done to strengthen the muscles that support joints that are affected by arthritis. They can be performed with weights or with exercise bands to add resistance.  ? Aerobic activities. These are exercises, such as brisk walking or water aerobics, that get your heart pumping.  ? Range-of-motion activities. These keep your joints easy to move.  ? Balance and agility exercises.  Managing pain, stiffness, and swelling    · If directed, apply heat to the affected area as often as told by your health care provider. Use the heat source that your health care provider recommends, such as a moist heat pack or a heating pad.  ? If you have a removable assistive device, remove it as told by your health care provider.  ? Place a towel between your skin and the heat source. If your health care provider tells you to keep the assistive device on while you apply heat, place a towel between the assistive device and the heat source.  ? Leave the heat on for 20-30 minutes.  ? Remove the heat if your skin turns bright red. This is especially important if you are unable to feel pain, heat, or cold. You may have a greater risk of getting burned.  · If directed, put ice on the affected joint:  ? If you have a removable assistive device, remove it as told by your health care provider.  ? Put ice in a plastic bag.  ? Place a towel between your skin and the bag. If your health care provider tells you to keep the assistive device on during icing, place a towel between the assistive device and the bag.  ? Leave the ice on for 20 minutes, 2-3 times a day.  General instructions   · Take over-the-counter and prescription medicines only as told by your health care provider.  · Maintain a healthy weight. Follow instructions from your health care provider for weight control. These may include dietary restrictions.  · Do not use any products that contain nicotine or tobacco, such as cigarettes and e-cigarettes. These can delay bone healing. If you need help quitting, ask your health care provider.  · Use assistive devices as directed by your health care provider.  · Keep all follow-up visits as told by your health care provider. This is important.  Where to find more information:  · National Institute of Arthritis and Musculoskeletal and Skin Diseases: www.niams.nih.gov  · National Institute on Aging: www.nia.nih.gov  · American College of Rheumatology:  www.rheumatology.org  Contact a health care provider if:  · Your skin turns red.  · You develop a rash.  · You have pain that gets worse.  · You have a fever along with joint or muscle aches.  Get help right away if:  · You lose a lot of weight.  · You suddenly lose your appetite.  · You have night sweats.  Summary  · Osteoarthritis is a type of arthritis that affects tissue covering the ends of bones in joints (cartilage).  · This condition is caused by age-related wearing down of cartilage that covers the ends of bones.  · The main symptom of this condition is pain, swelling, and stiffness in the joint.  · There is no cure for this   condition, but treatment can help to control pain and improve joint function.  This information is not intended to replace advice given to you by your health care provider. Make sure you discuss any questions you have with your health care provider.  Document Released: 12/30/2004 Document Revised: 09/03/2015 Document Reviewed: 09/03/2015  Elsevier Interactive Patient Education © 2017 [employer] Inc.

## 2016-03-27 NOTE — Addendum Note (Signed)
Addended by: Almeta MonasSTONE, JANIE M on: 03/27/2016 09:35 AM   Modules accepted: Orders

## 2016-03-27 NOTE — Progress Notes (Signed)
   Subjective:    Patient ID: Jacob Sanders, male    DOB: 04-29-1945, 71 y.o.   MRN: [id number]  HPI Pt presents to the office today with bilateral knee and hip pain. Pt states the pain is worse and is having constant aching pain of 8 out 10. PT is scheduled for left hip replacement hopefully in the next few weeks after being cleared from cardiologists. Pt currently taking tylenol arthritis that helps slightly. Standing and walking makes the pain worse.    Review of Systems  Musculoskeletal: Positive for arthralgias, gait problem and joint swelling.  All other systems reviewed and are negative.      Objective:   Physical Exam  Constitutional: He is oriented to person, place, and time. He appears well-developed and well-nourished. No distress.  Eyes: Pupils are equal, round, and reactive to light. Right eye exhibits no discharge. Left eye exhibits no discharge.  Neck: Normal range of motion. Neck supple. No thyromegaly present.  Cardiovascular: Normal rate, regular rhythm, normal heart sounds and intact distal pulses.   No murmur heard. Pulmonary/Chest: Effort normal and breath sounds normal. No respiratory distress. He has no wheezes.  Abdominal: Soft. Bowel sounds are normal. He exhibits no distension. There is no tenderness.  Musculoskeletal: Normal range of motion. He exhibits tenderness. He exhibits no edema.  Pain in bilateral knees with flexion and extension, decrease ROM of bilateral hip with external rotation  Neurological: He is alert and oriented to person, place, and time.  Skin: Skin is warm and dry. No rash noted. No erythema.  Psychiatric: He has a normal mood and affect. His behavior is normal. Judgment and thought content normal.  Vitals reviewed.   BP 124/89   Pulse (!) 54   Temp 97.4 F (36.3 C) (Oral)   Ht 5\' 10"  (1.778 m)   Wt 183 lb 3.2 oz (83.1 kg)   BMI 26.29 kg/m        Assessment & Plan:  1. Colon cancer screening - Fecal occult blood,  imunochemical  2. Primary osteoarthritis of both knees - traMADol (ULTRAM) 50 MG tablet; Take 1-2 tablets (50-100 mg total) by mouth every 12 (twelve) hours as needed.  Dispense: 120 tablet; Refill: 2  3. Primary osteoarthritis of both hips - traMADol (ULTRAM) 50 MG tablet; Take 1-2 tablets (50-100 mg total) by mouth every 12 (twelve) hours as needed.  Dispense: 120 tablet; Refill: 2  PT started on Ultram today, discussed only taking pain medication as needed. Good RX coupon given to pt [facility] appt  Jannifer Rodneyhristy Rubel Heckard, FNP

## 2016-03-27 NOTE — Addendum Note (Signed)
Addended by: Almeta MonasSTONE, JANIE M on: 03/27/2016 10:22 AM   Modules accepted: Orders

## 2016-03-29 LAB — FECAL OCCULT BLOOD, IMMUNOCHEMICAL: FECAL OCCULT BLD: NEGATIVE

## 2016-04-07 ENCOUNTER — Telehealth: Payer: Self-pay | Admitting: Family

## 2016-04-07 ENCOUNTER — Encounter: Payer: Self-pay | Admitting: Family

## 2016-04-07 ENCOUNTER — Ambulatory Visit (INDEPENDENT_AMBULATORY_CARE_PROVIDER_SITE_OTHER): Payer: Medicare Other | Admitting: Family

## 2016-04-07 VITALS — BP 128/86 | HR 57 | Temp 96.9°F | Ht 70.0 in | Wt 179.0 lb

## 2016-04-07 DIAGNOSIS — I4891 Unspecified atrial fibrillation: Secondary | ICD-10-CM | POA: Diagnosis not present

## 2016-04-07 DIAGNOSIS — D699 Hemorrhagic condition, unspecified: Secondary | ICD-10-CM | POA: Diagnosis not present

## 2016-04-07 LAB — COAGUCHEK XS/INR WAIVED
INR: 3.3 — AB (ref 0.9–1.1)
Prothrombin Time: 39.1 s

## 2016-04-07 NOTE — Progress Notes (Signed)
   Subjective:    Patient ID: Jacob Sanders, male    DOB: [birth year], 71 y.o.   MRN: [id number]  HPI Pt presents to the office today with increase bruising. Pt started warfarin on 03/21/16 for new A Fib. Pt states he noticed increase bruising and stopped his warfarin Saturday, Sunday and today. Denies any bleeding or SOB.    Review of Systems  Skin:       Bruising   All other systems reviewed and are negative.      Objective:   Physical Exam  Constitutional: He is oriented to person, place, and time. He appears well-developed and well-nourished. No distress.  HENT:  Head: Normocephalic.  Right Ear: External ear normal.  Left Ear: External ear normal.  Mouth/Throat: Oropharynx is clear and moist.  Eyes: Pupils are equal, round, and reactive to light. Right eye exhibits no discharge. Left eye exhibits no discharge.  Neck: Normal range of motion. Neck supple. No thyromegaly present.  Cardiovascular: Normal rate, regular rhythm, normal heart sounds and intact distal pulses.   No murmur heard. Pulmonary/Chest: Effort normal. No respiratory distress. He has decreased breath sounds. He has no wheezes.  Abdominal: Soft. Bowel sounds are normal. He exhibits no distension. There is no tenderness.  Musculoskeletal: Normal range of motion. He exhibits no edema or tenderness.  Neurological: He is alert and oriented to person, place, and time.  Walking with cane, pain in bilateral knees   Skin: Skin is warm and dry. No rash noted. No erythema.  Psychiatric: He has a normal mood and affect. His behavior is normal. Judgment and thought content normal.  Vitals reviewed.    BP 128/86   Pulse (!) 57   Temp (!) 96.9 F (36.1 C) (Oral)   Ht 5\' 10"  (1.778 m)   Wt 179 lb (81.2 kg)   BMI 25.68 kg/m      Assessment & Plan:  1. Anticoagulant disorder (HCC) - CoaguChek XS/INR Waived  2. Atrial fibrillation, unspecified type [facility](HCC) Anticoagulation Dose Instructions as of 04/07/2016    Glynis SmilesSun Mon Tue Wed Thu Fri Sat   New Dose 5 mg 5 mg 5 mg 5 mg 5 mg 5 mg 5 mg    Description   INR 3.3, but has not taken medication in last three days. Start taking medication today and follow up in 1 week.      Discussed the importance of taking medication everyday.  Call if you have any bleeding Follow up in 1 week with Tammy Diet discussed  Continue Warfarin  Jannifer Rodneyhristy Lunette Tapp, FNP

## 2016-04-07 NOTE — Telephone Encounter (Signed)
Spoke with pt's wife regarding excessive bruising Scheduled appt for evaluation

## 2016-04-07 NOTE — Patient Instructions (Signed)
Bleeding Precautions When on Anticoagulant Therapy  WHAT IS ANTICOAGULANT THERAPY?  Anticoagulant therapy is taking medicine to prevent or reduce blood clots. It is also called blood thinner therapy. Blood clots that form in your blood vessels can be dangerous. They can break loose and travel to your heart, lungs, or brain. This increases your risk of a heart attack or stroke. Anticoagulant therapy causes blood to clot more slowly.  You may need anticoagulant therapy if you have:   A medical condition that increases the likelihood that blood clots will form.   A heart defect or a problem with heart rhythm.  It is also a common treatment after heart surgery, such as valve replacement.  WHAT ARE COMMON TYPES OF ANTICOAGULANT THERAPY?  Anticoagulant medicine can be injected or taken by mouth.If you need anticoagulant therapy quickly at the hospital, the medicine may be injected under your skin or given through an IV tube. Heparin is a common example of an anticoagulant that you may get at the hospital.  Most anticoagulant therapy is in the form of pills that you take at home every day. These may include:   Aspirin. This common blood thinner works by preventing blood cells (platelets) from sticking together to form a clot. Aspirin is not as strong as anticoagulants that slow down the time that it takes for your body to form a clot.   Clopidogrel. This is a newer type of drug that affects platelets. It is stronger than aspirin.   Warfarin. This is the most common anticoagulant. It changes the way your body uses vitamin K, a vitamin that helps your blood to clot. The risk of bleeding is higher with warfarin than with aspirin. You will need frequent blood tests to make sure you are taking the safest amount.   New anticoagulants. Several new drugs have been approved. They are all taken by mouth. Studies show that these drugs work as well as warfarin. They do not require blood testing. They may cause less bleeding  risk than warfarin.  WHAT DO I NEED TO REMEMBER WHEN TAKING ANTICOAGULANT THERAPY?  Anticoagulant therapy decreases your risk of forming a blood clot, but it increases your risk of bleeding. Work closely with your health care provider to make sure you are taking your medicine safely. These tips can help:   Learn ways to reduce your risk of bleeding.   If you are taking warfarin:  ? Have blood tests as ordered by your health care provider.  ? Do not make any sudden changes to your diet. Vitamin K in your diet can make warfarin less effective.  ? Do not get pregnant. This medicine may cause birth defects.   Take your medicine at the same time every day. If you forget to take your medicine, take it as soon as you remember. If you miss a whole day, do not double your dose of medicine. Take your normal dose and call your health care provider to check in.   Do not stop taking your medicine on your own.   Tell your health care provider before you start taking any new medicine, vitamin, or herbal product. Some of these could interfere with your therapy.   Tell all of your health care providers that you are on anticoagulant therapy.   Do not have surgery, medical procedures, or dental work until you tell your health care provider that you are on anticoagulant therapy.  WHAT CAN AFFECT HOW ANTICOAGULANTS WORK?  Certain foods, vitamins, medicines, supplements, and herbal   medicines change the way that anticoagulant therapy works. They may increase or decrease the effects of your anticoagulant therapy. Either result can be dangerous for you.   Many over-the-counter medicines for pain, colds, or stomach problems interfere with anticoagulant therapy. Take these only as told by your health care provider.   Do not drink alcohol. It can interfere with your medicine and increase your risk of an injury that causes bleeding.   If you are taking warfarin, do not begin eating more foods that contain vitamin K. These include  leafy green vegetables. Ask your health care provider if you should avoid any foods.  WHAT ARE SOME WAYS TO PREVENT BLEEDING?  You can prevent bleeding by taking certain precautions:   Be extra careful when you use knives, scissors, or other sharp objects.   Use an electric razor instead of a blade.   Do not use toothpicks.   Use a soft toothbrush.   Wear shoes that have nonskid soles.   Use bath mats and handrails in your bathroom.   Wear gloves while you do yard work.   Wear a helmet when you ride a bike.   Wear your seat belt.   Prevent falls by removing loose rugs and extension cords from areas where you walk.   Do not play contact sports or participate in other activities that have a high risk of injury.  WHEN SHOULD I CONTACT MY HEALTH CARE PROVIDER?  Call your health care provider if:   You miss a dose of medicine:  ? And you are not sure what to do.  ? For more than one day.   You have:  ? Menstrual bleeding that is heavier than normal.  ? Blood in your urine.  ? A bloody nose or bleeding gums.  ? Easy bruising.  ? Blood in your stool (feces) or have black and tarry stool.  ? Side effects from your medicine.   You feel weak or dizzy.   You become pregnant.  Seek immediate medical care if:   You have bleeding that will not stop.   You have sudden and severe headache or belly pain.   You vomit or you cough up bright red blood.   You have a severe blow to your head.  WHAT ARE SOME QUESTIONS TO ASK MY HEALTH CARE PROVIDER?   What is the best anticoagulant therapy for my condition?   What side effects should I watch for?   When should I take my medicine? What should I do if I forget to take it?   Will I need to have regular blood tests?   Do I need to change my diet? Are there foods or drinks that I should avoid?   What activities are safe for me?   What should I do if I want to get pregnant?  This information is not intended to replace advice given to you by your health care provider.  Make sure you discuss any questions you have with your health care provider.  Document Released: 12/11/2014 Document Reviewed: 12/11/2014  Elsevier Interactive Patient Education  2017 Elsevier Inc.

## 2016-04-17 ENCOUNTER — Other Ambulatory Visit: Payer: Self-pay | Admitting: Family

## 2016-04-17 ENCOUNTER — Ambulatory Visit (INDEPENDENT_AMBULATORY_CARE_PROVIDER_SITE_OTHER): Payer: Medicare Other | Admitting: Pharmacist

## 2016-04-17 DIAGNOSIS — M160 Bilateral primary osteoarthritis of hip: Secondary | ICD-10-CM

## 2016-04-17 DIAGNOSIS — M170 Bilateral primary osteoarthritis of knee: Secondary | ICD-10-CM

## 2016-04-17 DIAGNOSIS — I4891 Unspecified atrial fibrillation: Secondary | ICD-10-CM

## 2016-04-17 LAB — COAGUCHEK XS/INR WAIVED
INR: 2.9 — ABNORMAL HIGH (ref 0.9–1.1)
Prothrombin Time: 34.8 s

## 2016-04-17 MED ORDER — TRAMADOL HCL 50 MG PO TABS
100.0000 mg | ORAL_TABLET | Freq: Three times a day (TID) | ORAL | 0 refills | Status: DC | PRN
Start: 2016-04-17 — End: 2016-05-23

## 2016-04-17 NOTE — Progress Notes (Signed)
Ultram increased to 180 tabs  A month from 120 mg for increased pain

## 2016-04-17 NOTE — Patient Instructions (Signed)
Vitamin K Foods and Warfarin Warfarin is a blood thinner (anticoagulant). Anticoagulant medicines help prevent the formation of blood clots. These medicines work by decreasing the activity of vitamin K, which promotes normal blood clotting. When you take warfarin, problems can occur from suddenly increasing or decreasing the amount of vitamin K that you eat from one day to the next. Problems may include:  Blood clots.  Bleeding. What general guidelines do I need to follow? To avoid problems when taking warfarin:  Eat a balanced diet that includes:  Fresh fruits and vegetables.  Whole grains.  Low-fat dairy products.  Lean proteins, such as fish, eggs, and lean cuts of meat.  Keep your intake of vitamin K consistent from day to day. To do this:  Avoid eating large amounts of vitamin K one day and low amounts of vitamin K the next day.  If you take a multivitamin that contains vitamin K, be sure to take it every day.  Know which foods contain vitamin K. Use the lists below to understand serving sizes and the amount of vitamin K in one serving.  Avoid major changes in your diet. If you are going to change your diet, talk with your health care provider before making changes.  Work with a nutrition specialist (dietitian) to develop a meal plan that works best for you. High vitamin K foods Foods that are high in vitamin K contain more than 100 mcg (micrograms) per serving. These include:  Broccoli (cooked) -  cup has 110 mcg.  Brussels sprouts (cooked) -  cup has 109 mcg.  Greens, beet (cooked) -  cup has 350 mcg.  Greens, collard (cooked) -  cup has 418 mcg.  Greens, turnip (cooked) -  cup has 265 mcg.  Green onions or scallions -  cup has 105 mcg.  Kale (fresh or frozen) -  cup has 531 mcg.  Parsley (raw) - 10 sprigs has 164 mcg.  Spinach (cooked) -  cup has 444 mcg.  Swiss chard (cooked) -  cup has 287 mcg. Moderate vitamin K foods Foods that have a  moderate amount of vitamin K contain 25-100 mcg per serving. These include:  Asparagus (cooked) - 5 spears have 38 mcg.  Black-eyed peas (dried) -  cup has 32 mcg.  Cabbage (cooked) -  cup has 37 mcg.  Kiwi fruit - 1 medium has 31 mcg.  Lettuce - 1 cup has 57-63 mcg.  Okra (frozen) -  cup has 44 mcg.  Prunes (dried) - 5 prunes have 25 mcg.  Watercress (raw) - 1 cup has 85 mcg. Low vitamin K foods Foods low in vitamin K contain less than 25 mcg per serving. These include:  Artichoke - 1 medium has 18 mcg.  Avocado - 1 oz. has 6 mcg.  Blueberries -  cup has 14 mcg.  Cabbage (raw) -  cup has 21 mcg.  Carrots (cooked) -  cup has 11 mcg.  Cauliflower (raw) -  cup has 11 mcg.  Cucumber with peel (raw) -  cup has 9 mcg.  Grapes -  cup has 12 mcg.  Mango - 1 medium has 9 mcg.  Nuts - 1 oz. has 15 mcg.  Pear - 1 medium has 8 mcg.  Peas (cooked) -  cup has 19 mcg.  Pickles - 1 spear has 14 mcg.  Pumpkin seeds - 1 oz. has 13 mcg.  Sauerkraut (canned) -  cup has 16 mcg.  Soybeans (cooked) -  cup has 16 mcg.    Tomato (raw) - 1 medium has 10 mcg.  Tomato sauce -  cup has 17 mcg. Vitamin K-free foods If a food contain less than 5 mcg per serving, it is considered to have no vitamin K. These foods include:  Bread and cereal products.  Cheese.  Eggs.  Fish and shellfish.  Meat and poultry.  Milk and dairy products.  Sunflower seeds. Actual amounts of vitamin K in foods may be different depending on processing. Talk with your dietitian about what foods you can eat and what foods you should avoid. This information is not intended to replace advice given to you by your health care provider. Make sure you discuss any questions you have with your health care provider. Document Released: 10/27/2008 Document Revised: 07/22/2015 Document Reviewed: 04/04/2015 Elsevier Interactive Patient Education  2017 [employer]vier Inc.  

## 2016-04-24 ENCOUNTER — Ambulatory Visit (INDEPENDENT_AMBULATORY_CARE_PROVIDER_SITE_OTHER): Payer: Medicare Other | Admitting: Pharmacist

## 2016-04-24 DIAGNOSIS — I4891 Unspecified atrial fibrillation: Secondary | ICD-10-CM

## 2016-04-24 LAB — COAGUCHEK XS/INR WAIVED
INR: 2.7 — AB (ref 0.9–1.1)
Prothrombin Time: 31.8 s

## 2016-04-24 NOTE — Patient Instructions (Signed)
Anticoagulation Dose Instructions as of 04/24/2016      Jacob Sanders Tue Wed Thu Fri Sat         0 mg  none 2.5 mg  1/2 tablet   New Dose 5 mg 2.5 mg 5 mg 5 mg 5 mg 2.5 mg 5 mg    Description   No warfarin tomorrow - Friday, April 13th. Take 1/2 tablet Saturday, April 14th.  Then decrease warfarin  tablets to 1/2 tablet on Mondays and Fridays.  Take 1 tablet all other days.  INR was 2.7 today (goal INR is 2.0 to 3.0)    **start taking warfarin in the evening

## 2016-04-25 ENCOUNTER — Telehealth: Payer: Self-pay | Admitting: Cardiovascular Disease

## 2016-04-25 ENCOUNTER — Encounter: Payer: Self-pay | Admitting: Pharmacist Clinician (PhC)/ Clinical Pharmacy Specialist

## 2016-04-25 NOTE — Telephone Encounter (Signed)
I spoke with the pt's wife and she states someone called this morning but she is unsure who called. I advised her that we have left messages attempting to reach the pt in regards to needing a surgical clearance appointment.  Patient is scheduled to see Dr Kirke Corin on 05/06/16.

## 2016-04-25 NOTE — Telephone Encounter (Signed)
New Message  Pts wife voiced she is returning someone's call but there are no encounters for this pts conversation.  Please f/u

## 2016-05-02 ENCOUNTER — Encounter: Payer: Self-pay | Admitting: Pharmacist Clinician (PhC)/ Clinical Pharmacy Specialist

## 2016-05-05 ENCOUNTER — Other Ambulatory Visit: Payer: Self-pay | Admitting: Family

## 2016-05-06 ENCOUNTER — Encounter: Payer: Self-pay | Admitting: Cardiovascular Disease

## 2016-05-06 ENCOUNTER — Ambulatory Visit (INDEPENDENT_AMBULATORY_CARE_PROVIDER_SITE_OTHER): Payer: Medicare Other | Admitting: Cardiovascular Disease

## 2016-05-06 VITALS — BP 126/90 | HR 60 | Ht 70.0 in | Wt 182.0 lb

## 2016-05-06 DIAGNOSIS — I10 Essential (primary) hypertension: Secondary | ICD-10-CM | POA: Diagnosis not present

## 2016-05-06 DIAGNOSIS — I4891 Unspecified atrial fibrillation: Secondary | ICD-10-CM | POA: Diagnosis not present

## 2016-05-06 DIAGNOSIS — Z01810 Encounter for preprocedural cardiovascular examination: Secondary | ICD-10-CM

## 2016-05-06 NOTE — Progress Notes (Signed)
Cardiology Office Note   Date:  05/06/2016   ID:  Jacob Sanders, DOB 1945/02/12, MRN [id number]  PCP:  Jannifer Rodney, FNP  Cardiologist:   Lorine Bears, MD   No chief complaint on file.     History of Present Illness: Jacob Sanders is a 71 y.o. male who is here today for a follow-up visit regarding chronic atrial fibrillation and preoperative cardiovascular evaluation before hip surgery. The patient was seen a few months ago for bilateral leg pain. ABI was normal and there was no evidence of peripheral arterial disease.  He has known history of essential hypertension and prolonged history of tobacco use.  Due to shortness of breath and abnormal EKG, he was referred for a nuclear stress test which showed no evidence of ischemia. EF was noted to be low so he was referred for an echo which showed normal LV systolic function mildly dilated left atrium. During his echo appointment, he was noted to be in atrial fibrillation. Rate has been reasonably controlled with diltiazem. He was started on warfarin for anticoagulation. He stopped warfarin recently due to a bleeding incident after dental extraction. He is overall concern about bleeding complications. He is having significant bilateral leg pain and is going to have hip replacement. He denies any chest pain, shortness of breath or palpitations. He cut down smoking to half a pack per day.   Past Medical History:  Diagnosis Date  . Arthritis   . GERD (gastroesophageal reflux disease)   . Hyperlipidemia   . Hypertension     Past Surgical History:  Procedure Laterality Date  . SPINE SURGERY     Resulting staph infection     Current Outpatient Prescriptions  Medication Sig Dispense Refill  . Acetaminophen (TYLENOL ARTHRITIS PAIN PO) Take 1 tablet by mouth 3 (three) times daily as needed (pain).     Marland Kitchen diltiazem (CARDIZEM LA) 240 MG 24 hr tablet Take 1 tablet (240 mg total) by mouth daily. 90 tablet 3  . omeprazole (PRILOSEC) 20  MG capsule TAKE 1 CAPSULE BY MOUTH  DAILY AS NEEDED 90 capsule 0  . traMADol (ULTRAM) 50 MG tablet Take 2 tablets (100 mg total) by mouth every 8 (eight) hours as needed. 180 tablet 0   No current facility-administered medications for this visit.     Allergies:   Prednisone    Social History:  The patient  reports that he has been smoking Cigars.  He has a 15.00 pack-year smoking history. He has never used smokeless tobacco. He reports that he does not drink alcohol or use drugs.   Family History:  The patient's family history includes Aneurysm in his father.    ROS:  Please see the history of present illness.   Otherwise, review of systems are positive for none.   All other systems are reviewed and negative.    PHYSICAL EXAM: VS:  BP 126/90   Pulse 60   Ht  (1.778 m)   Wt 182 lb (82.6 kg)   SpO2 97%   BMI 26.11 kg/m  , BMI Body mass index is 26.11 kg/m. GEN: Well nourished, well developed, in no acute distress  HEENT: normal  Neck: no JVD, carotid bruits, or masses Cardiac: RRR; no murmurs, rubs, or gallops,no edema  Respiratory:  clear to auscultation bilaterally, normal work of breathing GI: soft, nontender, nondistended, + BS MS: no deformity or atrophy  Skin: warm and dry, no rash Neuro:  Strength and sensation are intact Psych:  euthymic mood, full affect  EKG:  EKG is ordered today. The ekg ordered today demonstrates atrial fibrillation with ventricular rate of 96 bpm. Right bundle branch block. No changes.  Recent Labs: 03/21/2016: ALT 13; BUN 21; Creatinine, Ser 0.93; Platelets 242; Potassium 4.2; Sodium 144    Lipid Panel No results found for: CHOL, TRIG, HDL, CHOLHDL, VLDL, LDLCALC, LDLDIRECT    Wt Readings from Last 3 Encounters:  05/06/16 182 lb (82.6 kg)  04/07/16 179 lb (81.2 kg)  03/27/16 183 lb 3.2 oz (83.1 kg)      PAD Screen 12/18/2015  Previous PAD dx? No  Previous surgical procedure? No  Pain with walking? Yes  Subsides with rest? No    Feet/toe relief with dangling? No  Painful, non-healing ulcers? No  Extremities discolored? No      ASSESSMENT AND PLAN:  1.   Preoperative cardiovascular evaluation: The patient has atrial fibrillation with controlled rate. He had recent cardiac evaluation that included a stress test and echocardiogram. Both were overall unremarkable. He has no symptoms suggestive of angina or heart failure. Thus, he can proceed with surgery at an overall low risk. Continue treatment with diltiazem. He is currently not taking warfarin and this should be started postoperatively if no bleeding complications.  2.  Chronic atrial fibrillation: Ventricular rate is controlled on diltiazem. CHADS VASc score is 2 due to age and hypertension. Thus, long-term anticoagulation is recommended. He cannot afford NOACs and wants to continue with warfarin. Recommend a target INR is between 2 and 3 and this has been managed by his primary care physician. As mentioned above, he has been off warfarin after recent bleeding incident after dental extraction. He will continue to hold warfarin for now given that he would be undergoing surgery soon. I recommend resuming warfarin postoperatively if no bleeding complications.  3. Essential hypertension: Blood pressure is controlled.   4. Tobacco use: Discussed the importance of smoking cessation.  Disposition:   FU with me in 4 months.  Signed,  Lorine Bears, MD  05/06/2016 5:17 PM    [facility]

## 2016-05-06 NOTE — Patient Instructions (Signed)
Medication Instructions:  Your physician recommends that you continue on your current medications as directed. Please refer to the Current Medication list given to you today.  Resume Warfarin after surgery when safe from surgeon's stand point.   Labwork: No new orders.   Testing/Procedures: No new orders.   Follow-Up: Your physician recommends that you schedule a follow-up appointment in: 4 MONTHS with Dr Kirke Corin   Any Other Special Instructions Will Be Listed Below (If Applicable).     If you need a refill on your cardiac medications before your next appointment, please call your pharmacy.

## 2016-05-07 ENCOUNTER — Ambulatory Visit: Payer: Self-pay | Admitting: Orthopedic Surgery

## 2016-05-07 NOTE — Progress Notes (Signed)
Please place orders in EPIC as patient is being scheduled for a Pre-op appointment! Thank you! 

## 2016-05-13 ENCOUNTER — Ambulatory Visit: Payer: Self-pay | Admitting: Orthopedic Surgery

## 2016-05-13 NOTE — H&P (Signed)
TOTAL HIP ADMISSION H&P  Patient is admitted for left total hip arthroplasty.  Subjective:  Chief Complaint: left hip pain  HPI: Jacob Sanders, 71 y.o. male, has a history of pain and functional disability in the left hip(s) due to arthritis and patient has failed non-surgical conservative treatments for greater than 12 weeks to include NSAID's and/or analgesics, corticosteriod injections, flexibility and strengthening excercises, use of assistive devices, weight reduction as appropriate and activity modification.  Onset of symptoms was gradual starting 5 years ago with rapidlly worsening course since that time.The patient noted no past surgery on the left hip(s).  Patient currently rates pain in the left hip at 10 out of 10 with activity. Patient has night pain, worsening of pain with activity and weight bearing, pain that interfers with activities of daily living, pain with passive range of motion and joint swelling. Patient has evidence of subchondral cysts, subchondral sclerosis, periarticular osteophytes and joint space narrowing by imaging studies. This condition presents safety issues increasing the risk of falls. There is no current active infection.  Patient Active Problem List   Diagnosis Date Noted  . Osteoarthritis, hip, bilateral 03/27/2016  . Atrial fibrillation (HCC) 03/21/2016  . Overweight (BMI 25.0-29.9) 03/21/2016  . Pain in both lower extremities 02/27/2016  . Tachycardia 02/27/2016  . History of atrial fibrillation without current medication 02/27/2016  . Primary osteoarthritis of both knees 11/06/2015  . Current smoker 12/14/2014  . Vitamin D deficiency 05/17/2014  . GERD (gastroesophageal reflux disease) 05/15/2014  . Fatigue 03/10/2014  . Arthritis 03/10/2014  . Essential hypertension, benign 04/20/2012  . Gout, unspecified 04/20/2012  . Hyperlipemia 04/20/2012   Past Medical History:  Diagnosis Date  . Arthritis   . GERD (gastroesophageal reflux disease)   .  Hyperlipidemia   . Hypertension     Past Surgical History:  Procedure Laterality Date  . SPINE SURGERY     Resulting staph infection     (Not in a hospital admission) Allergies  Allergen Reactions  . Prednisone Swelling    Social History  Substance Use Topics  . Smoking status: Current Every Day Smoker    Packs/day: 0.50    Years: 30.00    Types: Cigars  . Smokeless tobacco: Never Used  . Alcohol use No    Family History  Problem Relation Age of Onset  . Aneurysm Father      Review of Systems  Constitutional: Positive for malaise/fatigue.  HENT: Negative.   Eyes: Negative.   Respiratory: Negative.   Cardiovascular: Negative.   Gastrointestinal: Negative.   Genitourinary: Negative.   Musculoskeletal: Positive for back pain and joint pain.  Skin: Positive for itching. Negative for rash.  Neurological: Negative.   Endo/Heme/Allergies: Positive for environmental allergies.  Psychiatric/Behavioral: The patient is nervous/anxious.     Objective:  Physical Exam  Vitals reviewed. Constitutional: He is oriented to person, place, and time. He appears well-developed and well-nourished.  HENT:  Head: Normocephalic and atraumatic.  Eyes: Conjunctivae and EOM are normal. Pupils are equal, round, and reactive to light.  Neck: Normal range of motion. Neck supple. No thyromegaly present.  Cardiovascular: Normal rate and intact distal pulses.   Respiratory: Effort normal and breath sounds normal.  GI: He exhibits no distension.  Genitourinary:  Genitourinary Comments: deferred  Neurological: He is alert and oriented to person, place, and time. He has normal reflexes.  Skin: Skin is warm and dry.  Psychiatric: He has a normal mood and affect. His behavior is normal. Judgment and  thought content normal.    Vital signs in last 24 hours: @  Labs:   Estimated body mass index is 26.11 kg/m as calculated from the following:   Height as of 05/06/16:  (1.778  m).   Weight as of 05/06/16: 82.6 kg (182 lb).   Imaging Review Plain radiographs demonstrate severe degenerative joint disease of the left hip(s). The bone quality appears to be adequate for age and reported activity level.  Assessment/Plan:  End stage arthritis, left hip(s)  The patient history, physical examination, clinical judgement of the provider and imaging studies are consistent with end stage degenerative joint disease of the left hip(s) and total hip arthroplasty is deemed medically necessary. The treatment options including medical management, injection therapy, arthroscopy and arthroplasty were discussed at length. The risks and benefits of total hip arthroplasty were presented and reviewed. The risks due to aseptic loosening, infection, stiffness, dislocation/subluxation,  thromboembolic complications and other imponderables were discussed.  The patient acknowledged the explanation, agreed to proceed with the plan and consent was signed. Patient is being admitted for inpatient treatment for surgery, pain control, PT, OT, prophylactic antibiotics, VTE prophylaxis, progressive ambulation and ADL's and discharge planning.The patient is planning to be discharged home with HEP. Already has DME

## 2016-05-14 ENCOUNTER — Other Ambulatory Visit (HOSPITAL_COMMUNITY): Payer: Self-pay | Admitting: Emergency Medicine

## 2016-05-14 NOTE — Progress Notes (Addendum)
LOV/ cardiology clearance Dr Kirke Corin 05-06-16 epic EKG 05-06-16 (reviewed by Arida) Stress test 01-17-16 epic ECHO 01-24-16 epic CXR 03-21-16 epic HgA1C 03-21-16 epic Coagulation study 04-24-16 epic

## 2016-05-14 NOTE — Progress Notes (Signed)
Coag study routed via epic to dr swinteck

## 2016-05-14 NOTE — Patient Instructions (Signed)
Jacob Sanders  05/14/2016   Your procedure is scheduled on: 05-22-16  Report to [facility] Main  Entrance Take [place]  elevators to 3rd floor to  Short Stay Center at [phone].  Call this number if you have problems the morning of surgery [phone]    Remember: ONLY 1 PERSON MAY GO WITH YOU TO SHORT STAY TO GET  READY MORNING OF YOUR SURGERY.  Do not eat food or drink liquids :After Midnight.     Take these medicines the morning of surgery with A SIP OF WATER: diltiazem(cardizem), tylenol as needed, omeprazole(prilosec) as needed                                You may not have any metal on your body including hair pins and              piercings  Do not wear jewelry, make-up, lotions, powders or perfumes, deodorant              Men may shave face and neck.   Do not bring valuables to the hospital. [place] IS NOT             RESPONSIBLE   FOR VALUABLES.  Contacts, dentures or bridgework may not be worn into surgery.  Leave suitcase in the car. After surgery it may be brought to your room.                Please read over the following fact sheets you were given: _____________________________________________________________________             [facility] - Preparing for Surgery Before surgery, you can play an important role.  Because skin is not sterile, your skin needs to be as free of germs as possible.  You can reduce the number of germs on your skin by washing with CHG (chlorahexidine gluconate) soap before surgery.  CHG is an antiseptic cleaner which kills germs and bonds with the skin to continue killing germs even after washing. Please DO NOT use if you have an allergy to CHG or antibacterial soaps.  If your skin becomes reddened/irritated stop using the CHG and inform your nurse when you arrive at Short Stay. Do not shave (including legs and underarms) for at least 48 hours prior to the first CHG shower.  You may shave your face/neck. Please  follow these instructions carefully:  1.  Shower with CHG Soap the night before surgery and the  morning of Surgery.  2.  If you choose to wash your hair, wash your hair first as usual with your  normal  shampoo.  3.  After you shampoo, rinse your hair and body thoroughly to remove the  shampoo.                           4.  Use CHG as you would any other liquid soap.  You can apply chg directly  to the skin and wash                       Gently with a scrungie or clean washcloth.  5.  Apply the CHG Soap to your body ONLY FROM THE NECK DOWN.   Do not use on face/ open  Wound or open sores. Avoid contact with eyes, ears mouth and genitals (private parts).                       Wash face,  Genitals (private parts) with your normal soap.             6.  Wash thoroughly, paying special attention to the area where your surgery  will be performed.  7.  Thoroughly rinse your body with warm water from the neck down.  8.  DO NOT shower/wash with your normal soap after using and rinsing off  the CHG Soap.                9.  Pat yourself dry with a clean towel.            10.  Wear clean pajamas.            11.  Place clean sheets on your bed the night of your first shower and do not  sleep with pets. Day of Surgery : Do not apply any lotions/deodorants the morning of surgery.  Please wear clean clothes to the hospital/surgery center.  FAILURE TO FOLLOW THESE INSTRUCTIONS MAY RESULT IN THE CANCELLATION OF YOUR SURGERY PATIENT SIGNATURE_________________________________  NURSE SIGNATURE__________________________________  ________________________________________________________________________   Adam Phenix  An incentive spirometer is a tool that can help keep your lungs clear and active. This tool measures how well you are filling your lungs with each breath. Taking long deep breaths may help reverse or decrease the chance of developing breathing (pulmonary) problems  (especially infection) following:  A long period of time when you are unable to move or be active. BEFORE THE PROCEDURE   If the spirometer includes an indicator to show your best effort, your nurse or respiratory therapist will set it to a desired goal.  If possible, sit up straight or lean slightly forward. Try not to slouch.  Hold the incentive spirometer in an upright position. INSTRUCTIONS FOR USE  1. Sit on the edge of your bed if possible, or sit up as far as you can in bed or on a chair. 2. Hold the incentive spirometer in an upright position. 3. Breathe out normally. 4. Place the mouthpiece in your mouth and seal your lips tightly around it. 5. Breathe in slowly and as deeply as possible, raising the piston or the ball toward the top of the column. 6. Hold your breath for 3-5 seconds or for as long as possible. Allow the piston or ball to fall to the bottom of the column. 7. Remove the mouthpiece from your mouth and breathe out normally. 8. Rest for a few seconds and repeat Steps 1 through 7 at least 10 times every 1-2 hours when you are awake. Take your time and take a few normal breaths between deep breaths. 9. The spirometer may include an indicator to show your best effort. Use the indicator as a goal to work toward during each repetition. 10. After each set of 10 deep breaths, practice coughing to be sure your lungs are clear. If you have an incision (the cut made at the time of surgery), support your incision when coughing by placing a pillow or rolled up towels firmly against it. Once you are able to get out of bed, walk around indoors and cough well. You may stop using the incentive spirometer when instructed by your caregiver.  RISKS AND COMPLICATIONS  Take your time so you do not get  dizzy or light-headed.  If you are in pain, you may need to take or ask for pain medication before doing incentive spirometry. It is harder to take a deep breath if you are having  pain. AFTER USE  Rest and breathe slowly and easily.  It can be helpful to keep track of a log of your progress. Your caregiver can provide you with a simple table to help with this. If you are using the spirometer at home, follow these instructions: [place] IF:   You are having difficultly using the spirometer.  You have trouble using the spirometer as often as instructed.  Your pain medication is not giving enough relief while using the spirometer.  You develop fever of 100.5 F (38.1 C) or higher. SEEK IMMEDIATE MEDICAL CARE IF:   You cough up bloody sputum that had not been present before.  You develop fever of 102 F (38.9 C) or greater.  You develop worsening pain at or near the incision site. MAKE SURE YOU:   Understand these instructions.  Will watch your condition.  Will get help right away if you are not doing well or get worse. Document Released: 05/12/2006 Document Revised: 03/24/2011 Document Reviewed: 07/13/2006 Exit[employer] Patient Information 2014 Exit[employer], [place].   ________________________________________________________________________  WHAT IS A BLOOD TRANSFUSION? Blood Transfusion Information  A transfusion is the replacement of blood or some of its parts. Blood is made up of multiple cells which provide different functions.  Red blood cells carry oxygen and are used for blood loss replacement.  White blood cells fight against infection.  Platelets control bleeding.  Plasma helps clot blood.  Other blood products are available for specialized needs, such as hemophilia or other clotting disorders. BEFORE THE TRANSFUSION  Who gives blood for transfusions?   Healthy volunteers who are fully evaluated to make sure their blood is safe. This is blood bank blood. Transfusion therapy is the safest it has ever been in the practice of medicine. Before blood is taken from a donor, a complete history is taken to make sure that person has no history  of diseases nor engages in risky social behavior (examples are intravenous drug use or sexual activity with multiple partners). The donor's travel history is screened to minimize risk of transmitting infections, such as malaria. The donated blood is tested for signs of infectious diseases, such as HIV and hepatitis. The blood is then tested to be sure it is compatible with you in order to minimize the chance of a transfusion reaction. If you or a relative donates blood, this is often done in anticipation of surgery and is not appropriate for emergency situations. It takes many days to process the donated blood. RISKS AND COMPLICATIONS Although transfusion therapy is very safe and saves many lives, the main dangers of transfusion include:   Getting an infectious disease.  Developing a transfusion reaction. This is an allergic reaction to something in the blood you were given. Every precaution is taken to prevent this. The decision to have a blood transfusion has been considered carefully by your caregiver before blood is given. Blood is not given unless the benefits outweigh the risks. AFTER THE TRANSFUSION  Right after receiving a blood transfusion, you will usually feel much better and more energetic. This is especially true if your red blood cells have gotten low (anemic). The transfusion raises the level of the red blood cells which carry oxygen, and this usually causes an energy increase.  The nurse administering the transfusion will  monitor you carefully for complications. HOME CARE INSTRUCTIONS  No special instructions are needed after a transfusion. You may find your energy is better. Speak with your caregiver about any limitations on activity for underlying diseases you may have. SEEK MEDICAL CARE IF:   Your condition is not improving after your transfusion.  You develop redness or irritation at the intravenous (IV) site. SEEK IMMEDIATE MEDICAL CARE IF:  Any of the following symptoms  occur over the next 12 hours:  Shaking chills.  You have a temperature by mouth above 102 F (38.9 C), not controlled by medicine.  Chest, back, or muscle pain.  People around you feel you are not acting correctly or are confused.  Shortness of breath or difficulty breathing.  Dizziness and fainting.  You get a rash or develop hives.  You have a decrease in urine output.  Your urine turns a dark color or changes to pink, red, or brown. Any of the following symptoms occur over the next 10 days:  You have a temperature by mouth above 102 F (38.9 C), not controlled by medicine.  Shortness of breath.  Weakness after normal activity.  The white part of the eye turns yellow (jaundice).  You have a decrease in the amount of urine or are urinating less often.  Your urine turns a dark color or changes to pink, red, or brown. Document Released: 12/28/1999 Document Revised: 03/24/2011 Document Reviewed: 08/16/2007 [facility] Patient Information 2014 [place], [place].  _______________________________________________________________________

## 2016-05-15 ENCOUNTER — Encounter (HOSPITAL_COMMUNITY)
Admission: RE | Admit: 2016-05-15 | Discharge: 2016-05-15 | Disposition: A | Discharge status: Home / Self Care | Payer: Medicare Other | Source: Ambulatory Visit | Attending: Orthopedic Surgery | Admitting: Orthopedic Surgery

## 2016-05-15 ENCOUNTER — Encounter (HOSPITAL_COMMUNITY): Payer: Self-pay

## 2016-05-15 DIAGNOSIS — Z01812 Encounter for preprocedural laboratory examination: Secondary | ICD-10-CM | POA: Insufficient documentation

## 2016-05-15 DIAGNOSIS — M1612 Unilateral primary osteoarthritis, left hip: Secondary | ICD-10-CM | POA: Diagnosis not present

## 2016-05-15 DIAGNOSIS — I4891 Unspecified atrial fibrillation: Secondary | ICD-10-CM | POA: Insufficient documentation

## 2016-05-15 DIAGNOSIS — M25552 Pain in left hip: Secondary | ICD-10-CM | POA: Insufficient documentation

## 2016-05-15 DIAGNOSIS — F17290 Nicotine dependence, other tobacco product, uncomplicated: Secondary | ICD-10-CM | POA: Diagnosis not present

## 2016-05-15 DIAGNOSIS — M7062 Trochanteric bursitis, left hip: Secondary | ICD-10-CM | POA: Diagnosis not present

## 2016-05-15 LAB — BASIC METABOLIC PANEL
ANION GAP: 8 (ref 5–15)
BUN: 17 mg/dL (ref 6–20)
CHLORIDE: 106 mmol/L (ref 101–111)
CO2: 24 mmol/L (ref 22–32)
Calcium: 8.9 mg/dL (ref 8.9–10.3)
Creatinine, Ser: 0.71 mg/dL (ref 0.61–1.24)
GFR calc non Af Amer: >60 mL/min (ref >60)
Glucose, Bld: 90 mg/dL (ref 65–99)
POTASSIUM: 3.7 mmol/L (ref 3.5–5.1)
SODIUM: 138 mmol/L (ref 135–145)

## 2016-05-15 LAB — ABO/RH: ABO/RH(D): O NEG

## 2016-05-15 LAB — CBC
HEMATOCRIT: 46.4 % (ref 39.0–52.0)
HEMOGLOBIN: 16.1 g/dL (ref 13.0–17.0)
MCH: 32.9 pg (ref 26.0–34.0)
MCHC: 34.7 g/dL (ref 30.0–36.0)
MCV: 94.9 fL (ref 78.0–100.0)
Platelets: 239 10*3/uL (ref 150–400)
RBC: 4.89 MIL/uL (ref 4.22–5.81)
RDW: 13.5 % (ref 11.5–15.5)
WBC: 11.4 10*3/uL — AB (ref 4.0–10.5)

## 2016-05-15 LAB — SURGICAL PCR SCREEN
MRSA, PCR: NEGATIVE
STAPHYLOCOCCUS AUREUS: NEGATIVE

## 2016-05-15 NOTE — Progress Notes (Signed)
Consent in epic incorrect; spoke to Jacki ConesLaurie at [facility]; she confirmed swinteck put order in incorrectly. She assures she will have him place new order in epic. RN indicated that patient needs to sign consent day of surgery on front sheet.

## 2016-05-22 ENCOUNTER — Inpatient Hospital Stay (HOSPITAL_COMMUNITY)
Admission: RE | Admit: 2016-05-22 | Discharge: 2016-05-23 | DRG: 470 | Disposition: A | Discharge status: Home / Self Care | Payer: Medicare Other | Source: Ambulatory Visit | Attending: Orthopedic Surgery | Admitting: Orthopedic Surgery

## 2016-05-22 ENCOUNTER — Inpatient Hospital Stay (HOSPITAL_COMMUNITY): Payer: Medicare Other

## 2016-05-22 ENCOUNTER — Inpatient Hospital Stay (HOSPITAL_COMMUNITY): Payer: Medicare Other | Admitting: Certified Registered Nurse Anesthetist

## 2016-05-22 ENCOUNTER — Encounter (HOSPITAL_COMMUNITY): Payer: Self-pay

## 2016-05-22 ENCOUNTER — Encounter (HOSPITAL_COMMUNITY)
Admission: RE | Disposition: A | Discharge status: Home / Self Care | Payer: Self-pay | Source: Ambulatory Visit | Attending: Orthopedic Surgery

## 2016-05-22 DIAGNOSIS — M1612 Unilateral primary osteoarthritis, left hip: Principal | ICD-10-CM | POA: Diagnosis present

## 2016-05-22 DIAGNOSIS — K219 Gastro-esophageal reflux disease without esophagitis: Secondary | ICD-10-CM | POA: Diagnosis present

## 2016-05-22 DIAGNOSIS — I10 Essential (primary) hypertension: Secondary | ICD-10-CM | POA: Diagnosis present

## 2016-05-22 DIAGNOSIS — Z09 Encounter for follow-up examination after completed treatment for conditions other than malignant neoplasm: Secondary | ICD-10-CM

## 2016-05-22 DIAGNOSIS — M25552 Pain in left hip: Secondary | ICD-10-CM

## 2016-05-22 DIAGNOSIS — I4891 Unspecified atrial fibrillation: Secondary | ICD-10-CM | POA: Diagnosis not present

## 2016-05-22 DIAGNOSIS — Z471 Aftercare following joint replacement surgery: Secondary | ICD-10-CM | POA: Diagnosis not present

## 2016-05-22 DIAGNOSIS — R918 Other nonspecific abnormal finding of lung field: Secondary | ICD-10-CM | POA: Diagnosis not present

## 2016-05-22 DIAGNOSIS — F17210 Nicotine dependence, cigarettes, uncomplicated: Secondary | ICD-10-CM | POA: Diagnosis present

## 2016-05-22 DIAGNOSIS — E785 Hyperlipidemia, unspecified: Secondary | ICD-10-CM | POA: Diagnosis not present

## 2016-05-22 DIAGNOSIS — Z888 Allergy status to other drugs, medicaments and biological substances status: Secondary | ICD-10-CM | POA: Diagnosis not present

## 2016-05-22 DIAGNOSIS — E559 Vitamin D deficiency, unspecified: Secondary | ICD-10-CM | POA: Diagnosis present

## 2016-05-22 DIAGNOSIS — Z96642 Presence of left artificial hip joint: Secondary | ICD-10-CM | POA: Diagnosis not present

## 2016-05-22 HISTORY — PX: TOTAL HIP ARTHROPLASTY: SHX124

## 2016-05-22 LAB — PROTIME-INR
INR: 1.03
PROTHROMBIN TIME: 13.5 s (ref 11.4–15.2)

## 2016-05-22 LAB — TYPE AND SCREEN
ABO/RH(D): O NEG
ANTIBODY SCREEN: NEGATIVE

## 2016-05-22 LAB — APTT: aPTT: 27 seconds (ref 24–36)

## 2016-05-22 IMAGING — CT CT CHEST W/O CM
2 of 4 series · 15 of 36 positions shown, 18 images · non-contrast
Comparison: Chest [DATE]

CLINICAL DATA: Postoperative check and to evaluate suspicious
nodule seen on chest radiograph.

EXAM:
CT CHEST WITHOUT CONTRAST
TECHNIQUE: Multidetector CT imaging of the chest was performed following the
standard protocol without IV contrast.

[Series 4: super d · axial · 0.75mm/px · z∈[-334,-50]mm · 12 of 320 slices shown, 15 images]
[im 18/320  mediastinal]
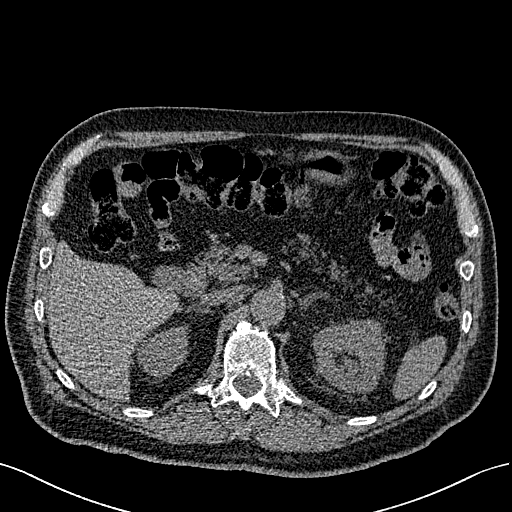
[im 18/320  lung]
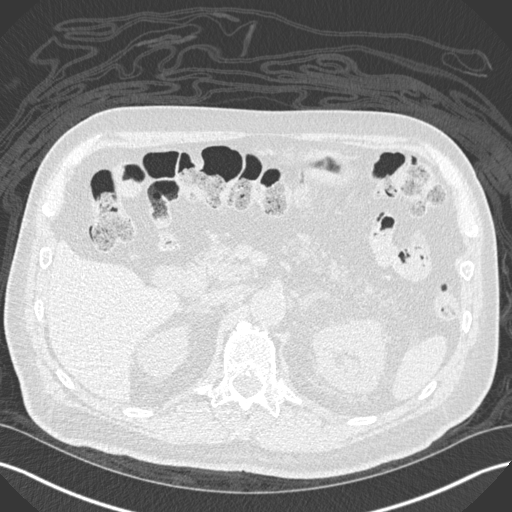
[im 54/320  lung]
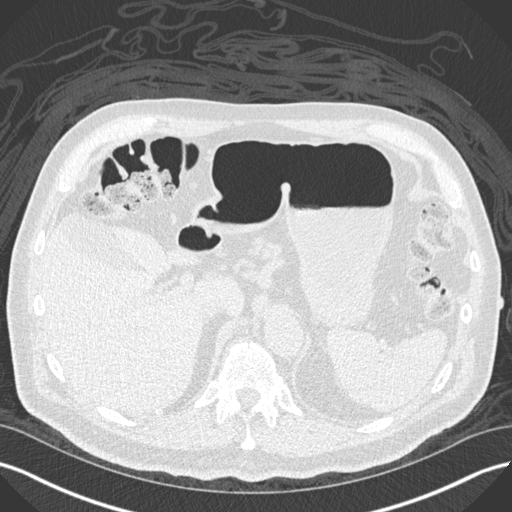
[im 71/320  lung]
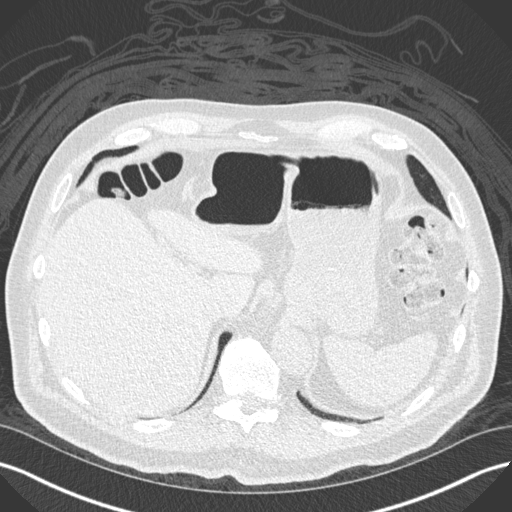
[im 89/320  lung]
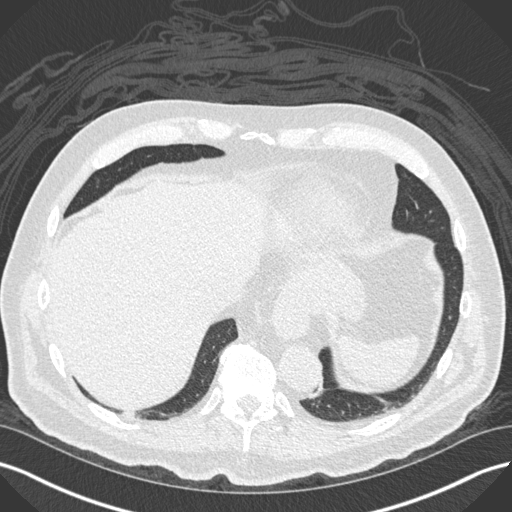
[im 125/320  mediastinal]
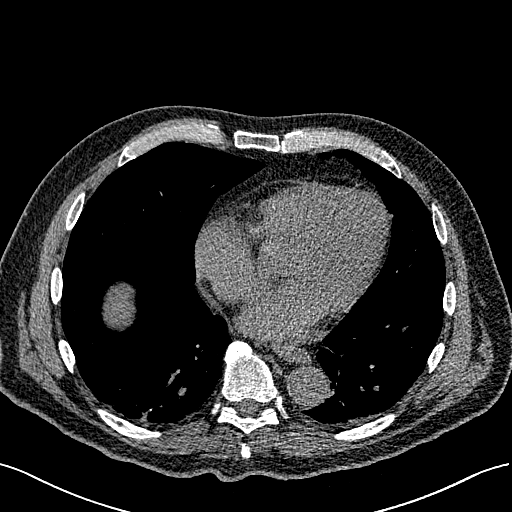
[im 125/320  lung]
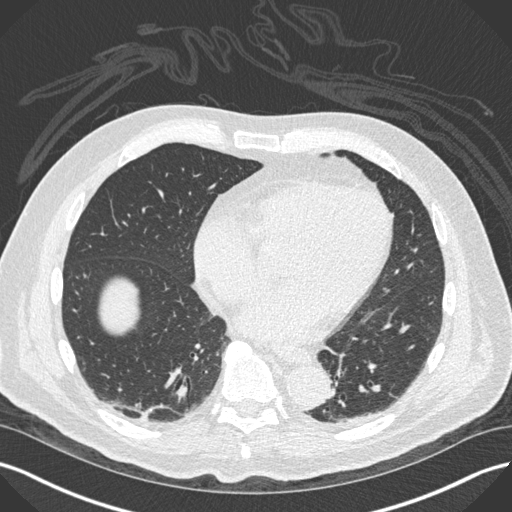
[im 142/320  lung]
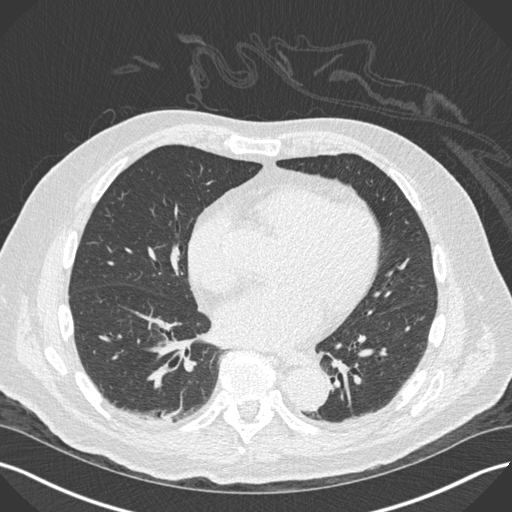
[im 178/320  lung]
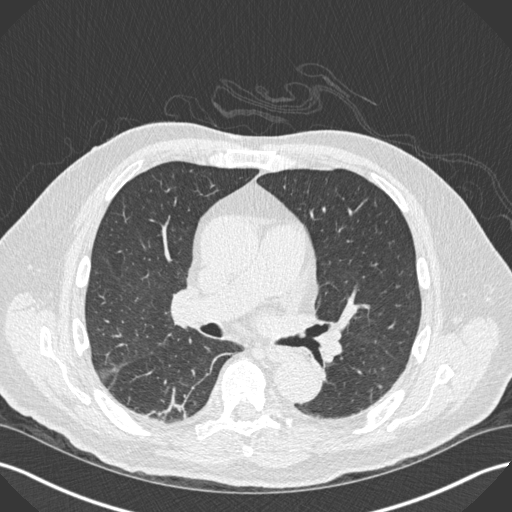
[im 195/320  lung]
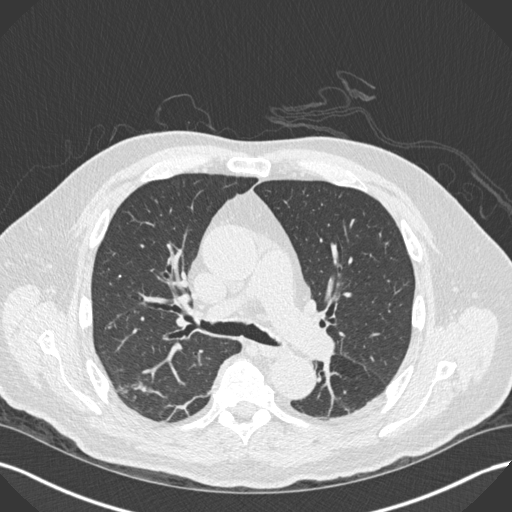
[im 231/320  mediastinal]
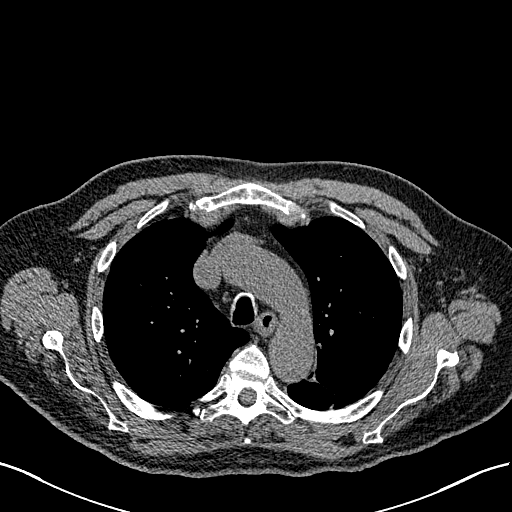
[im 231/320  lung]
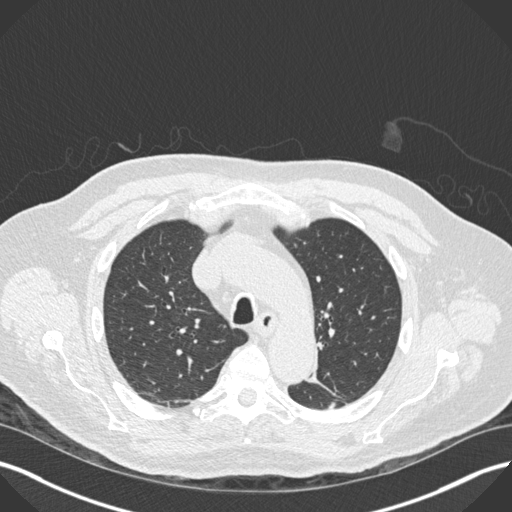
[im 249/320  lung]
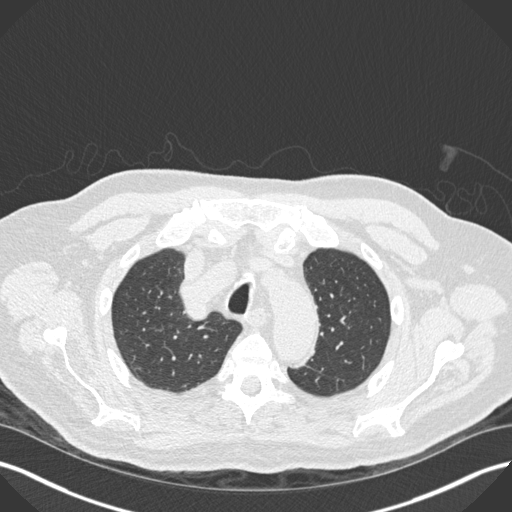
[im 266/320  lung]
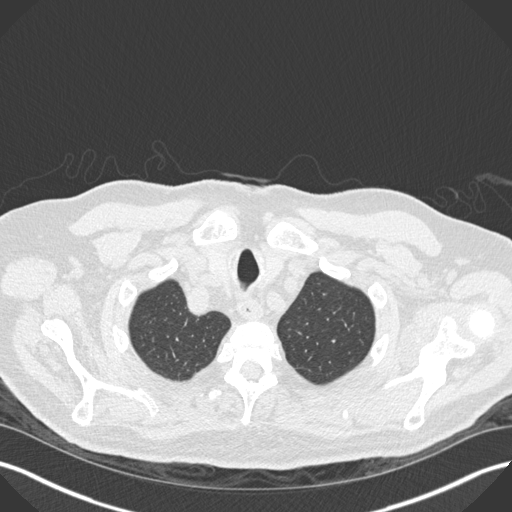
[im 302/320  lung]
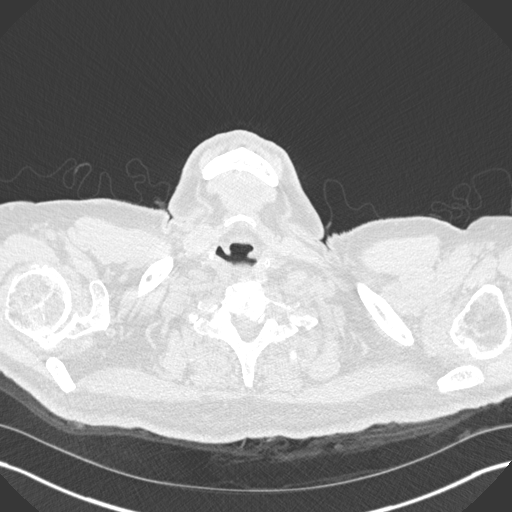

[Series 6: coronal · coronal · 0.60mm/px · 3 of 138 slices shown]
[im 28/138  lung]
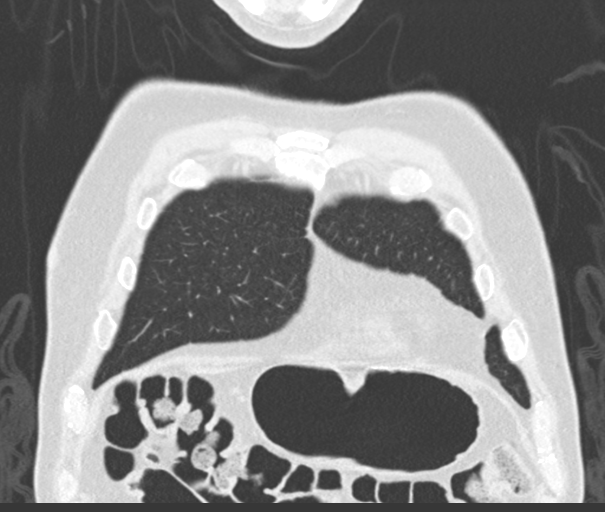
[im 55/138  lung]
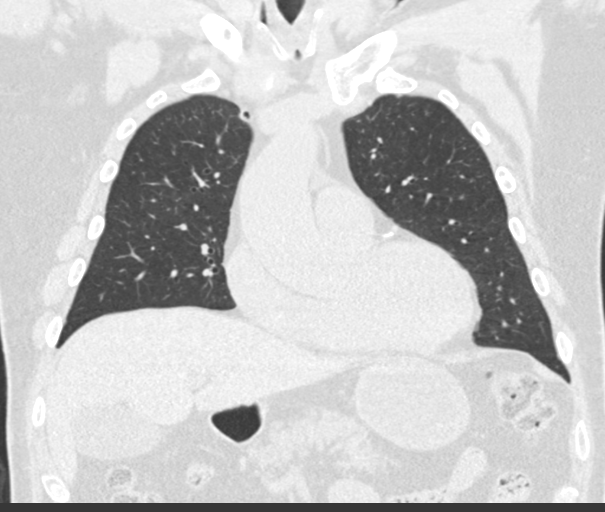
[im 83/138  lung]
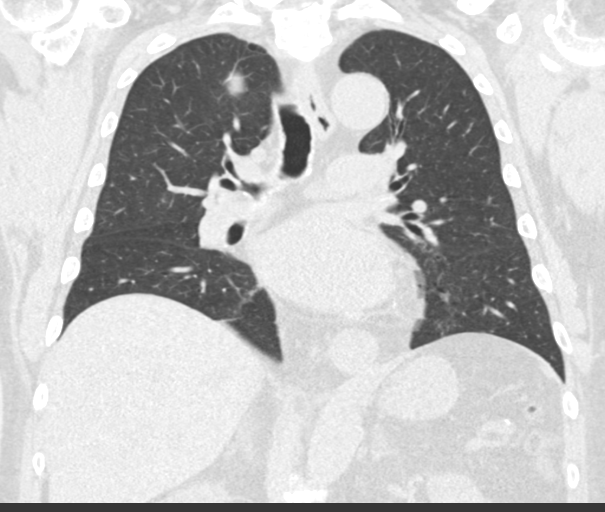

[15 of 36 positions shown; findings below may reference images not displayed]

FINDINGS: Cardiovascular: Normal heart size. No pericardial effusion. Coronary
artery calcifications. Mild aortic calcification. No aneurysm.

Mediastinum/Nodes: Thyroid gland is unremarkable. Small esophageal
hiatal hernia. Esophagus is decompressed. No significant
lymphadenopathy in the chest.

Lungs/Pleura: Linear atelectasis or scarring in the lung bases. No
focal airspace disease or consolidation. No significant parenchymal
nodules or mass lesions identified. Tiny calcified granulomas on the
right. No pleural effusions. No pneumothorax. Airways are patent.

Upper Abdomen: Fatty infiltration of the pancreas. Vascular
calcifications. Visualized upper abdominal organs otherwise
unremarkable on noncontrast imaging.

Musculoskeletal: Degenerative changes in the spine. Normal
alignment. No vertebral compression. No depressed rib or sternal
fractures. No focal bone lesions.Degenerative changes in the
shoulders. Soft tissue calcification involving the left shoulder of
could represent loose body or synovial osteochondromatosis. Bone
lucency in the angle of the mandible may represent periodontal
disease.
IMPRESSION: No acute process demonstrated in the chest. Coronary artery
calcifications. Small esophageal hiatal hernia. Linear scarring or
atelectasis in the lung bases.

## 2016-05-22 IMAGING — DX DG PORTABLE PELVIS
1 series · 1 of 1 positions shown · non-contrast
Comparison: [DATE]

CLINICAL DATA: Status post left hip replacement

EXAM:
PORTABLE PELVIS 1-2 VIEWS

[pelvis ap]
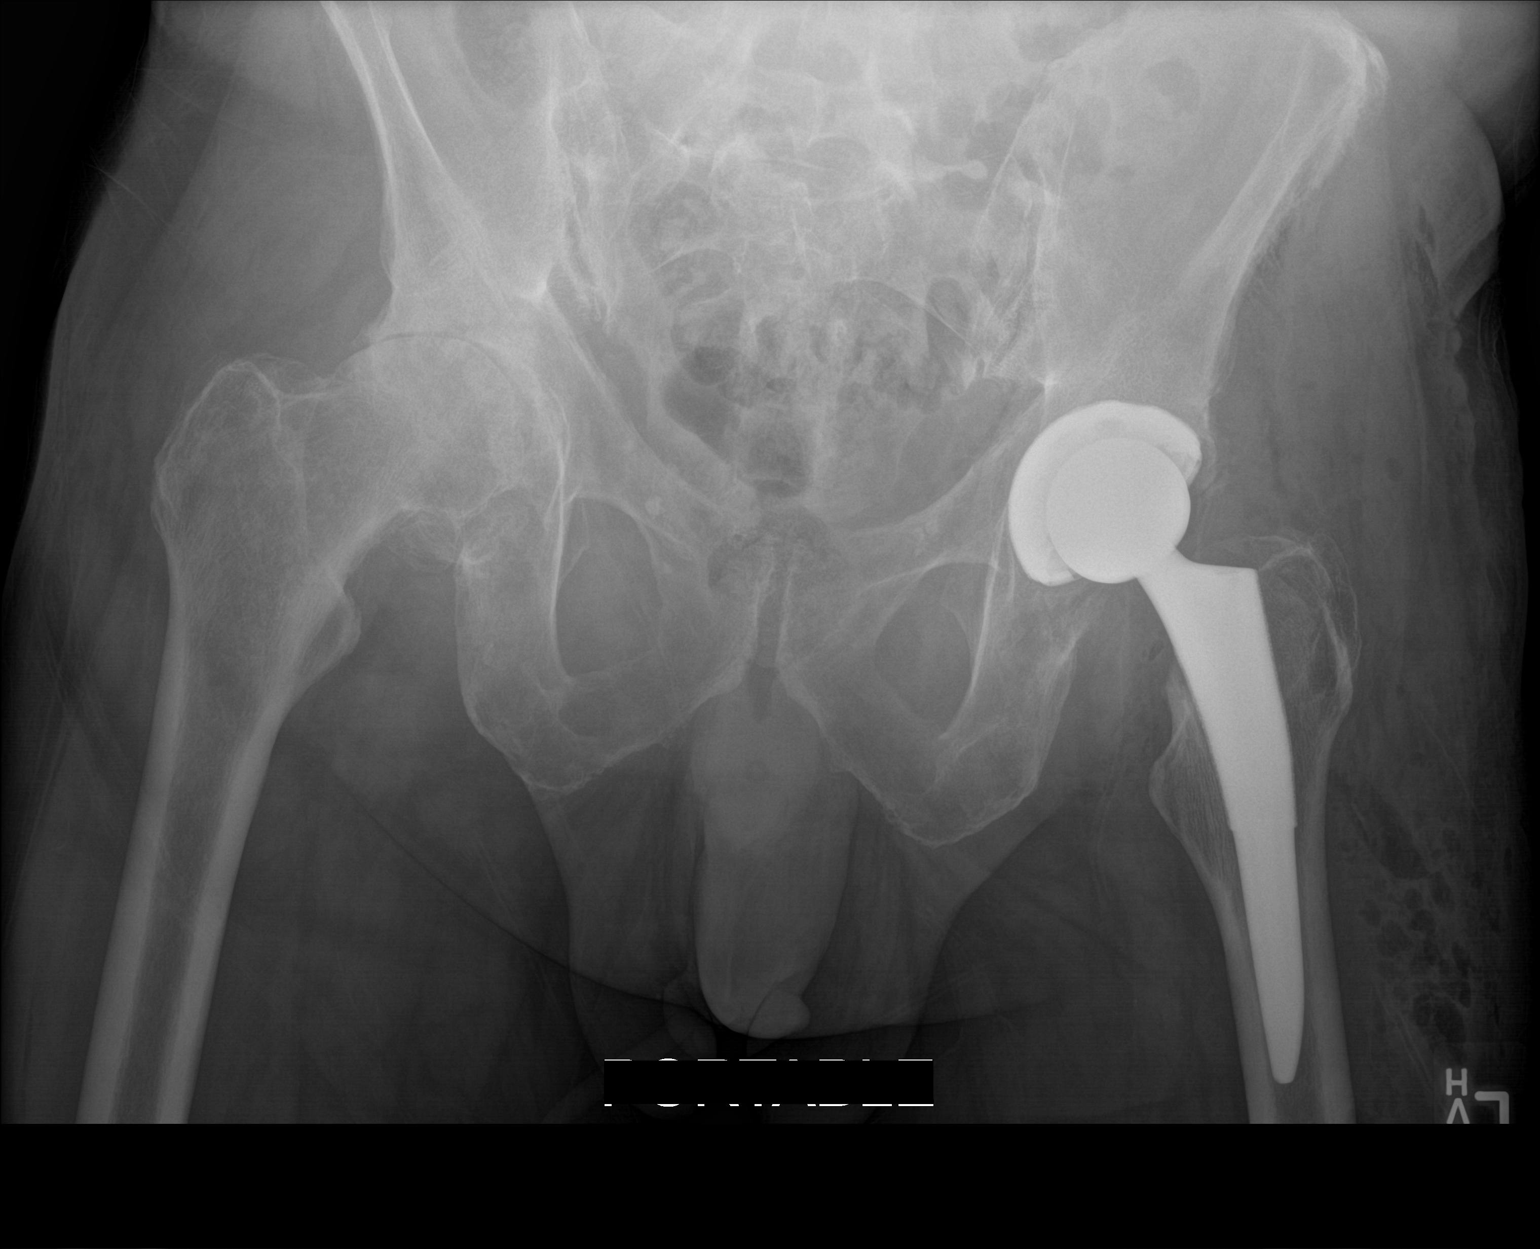

[1 of 1 positions shown; findings below may reference images not displayed]

FINDINGS: Left hip replacement is now seen in satisfactory position. Severe
degenerative changes of the right hip joint are noted. No acute bony
or soft tissue abnormality is seen.
IMPRESSION: Status post left hip replacement.

## 2016-05-22 IMAGING — RF DG HIP (WITH PELVIS) OPERATIVE*L*
1 series · 2 of 2 positions shown · non-contrast
Comparison: None.

CLINICAL DATA: 70-year-old male with a history of left hip
arthroplasty

EXAM:
OPERATIVE LEFT HIP (WITH PELVIS IF PERFORMED) 2 VIEWS
TECHNIQUE: Fluoroscopic spot image(s) were submitted for interpretation
post-operatively.

[Series 1: run · 2 of 2 slices shown]
[im 1/2]
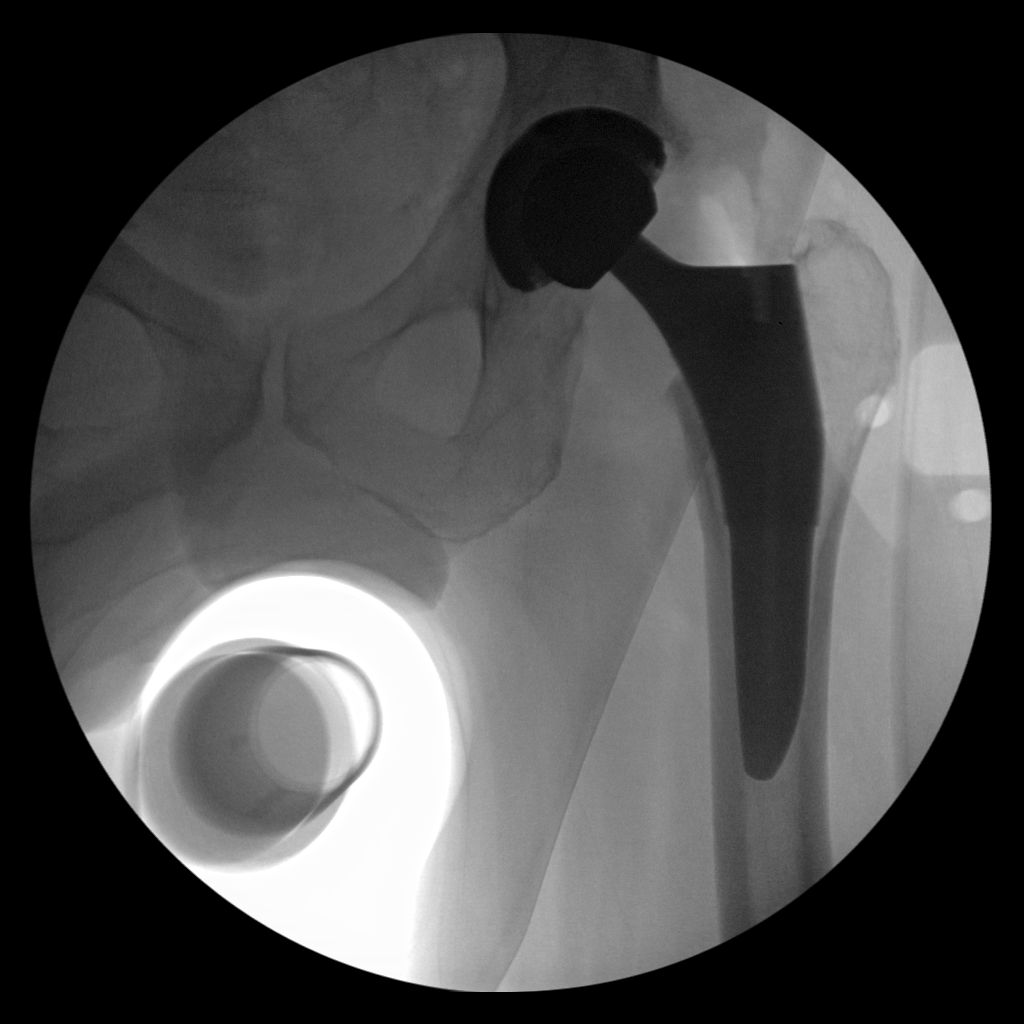
[im 2/2]
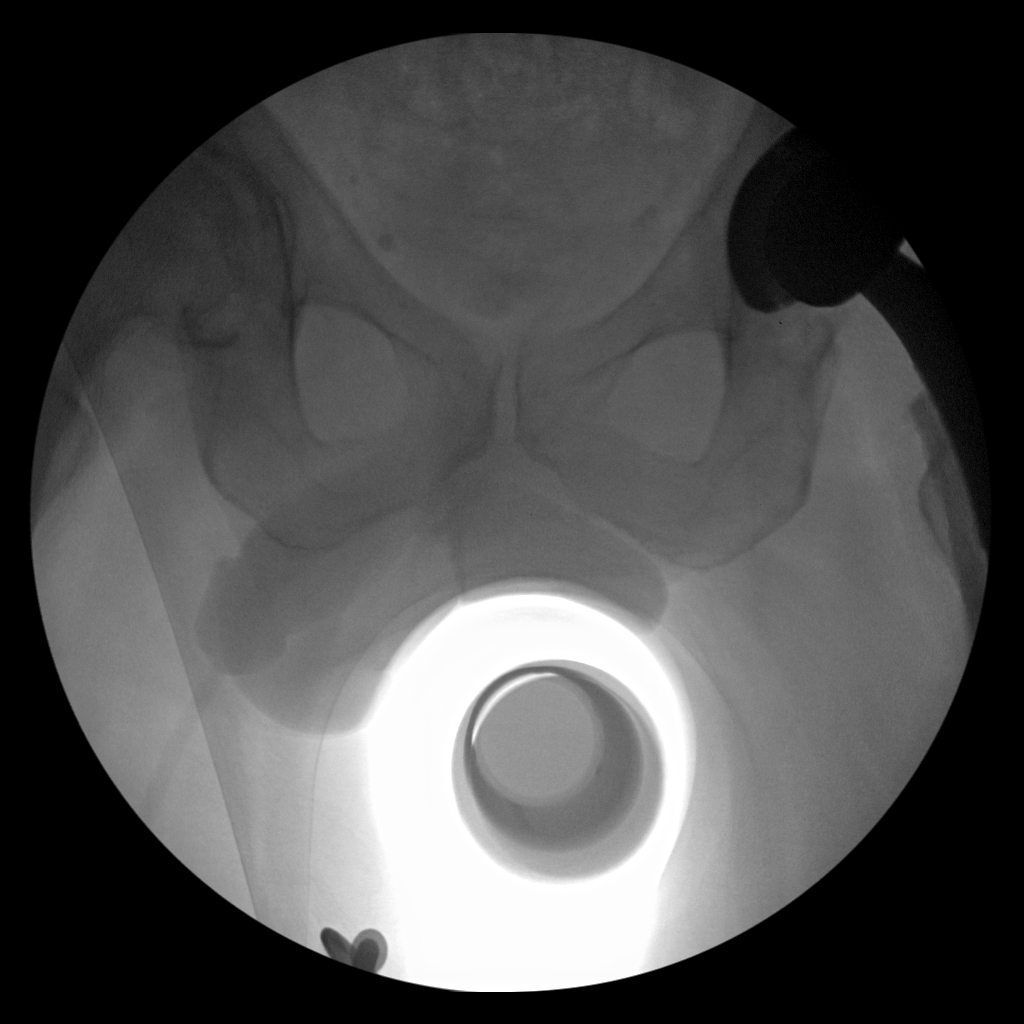

[2 of 2 positions shown; findings below may reference images not displayed]

FINDINGS: Intraoperative fluoroscopic spot images of the left hip
demonstrating left hip arthroplasty. No immediate complicating
features.
IMPRESSION: Intraoperative fluoroscopic spot images of left hip arthroplasty
with no immediate complicating features. Please refer to the
dictated operative report for full details of intraoperative
findings and procedure.

## 2016-05-22 SURGERY — ARTHROPLASTY, HIP, TOTAL, ANTERIOR APPROACH
Anesthesia: Spinal | Site: Hip | Laterality: Left

## 2016-05-22 MED ORDER — BUPIVACAINE HCL (PF) 0.5 % IJ SOLN
INTRAMUSCULAR | Status: DC | PRN
  Administered 2016-05-22: 3 mL via INTRATHECAL

## 2016-05-22 MED ORDER — LIDOCAINE 2% (20 MG/ML) 5 ML SYRINGE
INTRAMUSCULAR | Status: AC
  Filled 2016-05-22: qty 5

## 2016-05-22 MED ORDER — ACETAMINOPHEN 325 MG PO TABS: 650.0000 mg | ORAL_TABLET | Freq: Four times a day (QID) | ORAL | Status: DC | PRN

## 2016-05-22 MED ORDER — SODIUM CHLORIDE 0.9 % IJ SOLN
INTRAMUSCULAR | Status: DC | PRN
  Administered 2016-05-22: 30 mL

## 2016-05-22 MED ORDER — PANTOPRAZOLE SODIUM 40 MG PO TBEC
40.0000 mg | DELAYED_RELEASE_TABLET | Freq: Every day | ORAL | Status: DC
  Filled 2016-05-22: qty 1

## 2016-05-22 MED ORDER — LACTATED RINGERS IV SOLN
INTRAVENOUS | Status: DC | PRN
  Administered 2016-05-22 (×2): via INTRAVENOUS

## 2016-05-22 MED ORDER — METHOCARBAMOL 1000 MG/10ML IJ SOLN
500.0000 mg | Freq: Four times a day (QID) | INTRAVENOUS | Status: DC | PRN
  Administered 2016-05-22: 500 mg via INTRAVENOUS
  Filled 2016-05-22: qty 5

## 2016-05-22 MED ORDER — ONDANSETRON HCL 4 MG/2ML IJ SOLN: 4.0000 mg | Freq: Four times a day (QID) | INTRAMUSCULAR | Status: DC | PRN

## 2016-05-22 MED ORDER — ONDANSETRON HCL 4 MG PO TABS
4.0000 mg | ORAL_TABLET | Freq: Four times a day (QID) | ORAL | Status: DC | PRN
Start: 2016-05-22 — End: 2016-05-23

## 2016-05-22 MED ORDER — CEFAZOLIN SODIUM-DEXTROSE 2-4 GM/100ML-% IV SOLN
2.0000 g | INTRAVENOUS | Status: AC
  Administered 2016-05-22: 2 g via INTRAVENOUS
  Filled 2016-05-22: qty 100

## 2016-05-22 MED ORDER — KETOROLAC TROMETHAMINE 30 MG/ML IJ SOLN
INTRAMUSCULAR | Status: AC
Start: 2016-05-22 — End: 2016-05-22
  Filled 2016-05-22: qty 1

## 2016-05-22 MED ORDER — CEFAZOLIN SODIUM-DEXTROSE 2-4 GM/100ML-% IV SOLN
2.0000 g | Freq: Four times a day (QID) | INTRAVENOUS | Status: AC
  Administered 2016-05-22 – 2016-05-23 (×2): 2 g via INTRAVENOUS
  Filled 2016-05-22 (×2): qty 100

## 2016-05-22 MED ORDER — ACETAMINOPHEN 10 MG/ML IV SOLN
1000.0000 mg | INTRAVENOUS | Status: AC
  Administered 2016-05-22: 1000 mg via INTRAVENOUS
  Filled 2016-05-22: qty 100

## 2016-05-22 MED ORDER — PHENYLEPHRINE HCL 10 MG/ML IJ SOLN
INTRAMUSCULAR | Status: DC | PRN
  Administered 2016-05-22 (×4): 80 ug via INTRAVENOUS

## 2016-05-22 MED ORDER — TRANEXAMIC ACID 1000 MG/10ML IV SOLN
1000.0000 mg | Freq: Once | INTRAVENOUS | Status: AC
  Administered 2016-05-22: 1000 mg via INTRAVENOUS
  Filled 2016-05-22: qty 1100

## 2016-05-22 MED ORDER — DEXAMETHASONE SODIUM PHOSPHATE 4 MG/ML IJ SOLN
INTRAMUSCULAR | Status: DC | PRN
  Administered 2016-05-22: 10 mg via INTRAVENOUS

## 2016-05-22 MED ORDER — SODIUM CHLORIDE 0.9 % IR SOLN
Status: DC | PRN
  Administered 2016-05-22: 1000 mL

## 2016-05-22 MED ORDER — BUPIVACAINE HCL (PF) 0.5 % IJ SOLN
INTRAMUSCULAR | Status: AC
  Filled 2016-05-22: qty 30

## 2016-05-22 MED ORDER — PROPOFOL 500 MG/50ML IV EMUL
INTRAVENOUS | Status: DC | PRN
  Administered 2016-05-22: 75 ug/kg/min via INTRAVENOUS

## 2016-05-22 MED ORDER — MENTHOL 3 MG MT LOZG: 1.0000 | LOZENGE | OROMUCOSAL | Status: DC | PRN

## 2016-05-22 MED ORDER — MIDAZOLAM HCL 2 MG/2ML IJ SOLN
INTRAMUSCULAR | Status: AC
  Filled 2016-05-22: qty 2

## 2016-05-22 MED ORDER — PHENOL 1.4 % MT LIQD: 1.0000 | OROMUCOSAL | Status: DC | PRN

## 2016-05-22 MED ORDER — ONDANSETRON HCL 4 MG/2ML IJ SOLN
INTRAMUSCULAR | Status: AC
  Filled 2016-05-22: qty 2

## 2016-05-22 MED ORDER — FENTANYL CITRATE (PF) 100 MCG/2ML IJ SOLN
INTRAMUSCULAR | Status: DC | PRN
  Administered 2016-05-22 (×2): 50 ug via INTRAVENOUS

## 2016-05-22 MED ORDER — CHLORHEXIDINE GLUCONATE 4 % EX LIQD: 60.0000 mL | Freq: Once | CUTANEOUS | Status: DC

## 2016-05-22 MED ORDER — HYDROCODONE-ACETAMINOPHEN 5-325 MG PO TABS: 1.0000 | ORAL_TABLET | ORAL | Status: DC | PRN

## 2016-05-22 MED ORDER — SODIUM CHLORIDE 0.9 % IJ SOLN
INTRAMUSCULAR | Status: AC
  Filled 2016-05-22: qty 50

## 2016-05-22 MED ORDER — DEXAMETHASONE SODIUM PHOSPHATE 10 MG/ML IJ SOLN
INTRAMUSCULAR | Status: AC
  Filled 2016-05-22: qty 1

## 2016-05-22 MED ORDER — PROPOFOL 10 MG/ML IV BOLUS
INTRAVENOUS | Status: AC
  Filled 2016-05-22: qty 60

## 2016-05-22 MED ORDER — DEXAMETHASONE SODIUM PHOSPHATE 10 MG/ML IJ SOLN
10.0000 mg | Freq: Once | INTRAMUSCULAR | Status: DC
  Filled 2016-05-22: qty 1

## 2016-05-22 MED ORDER — WATER FOR IRRIGATION, STERILE IR SOLN
Status: DC | PRN
  Administered 2016-05-22: 2000 mL

## 2016-05-22 MED ORDER — DIPHENHYDRAMINE HCL 12.5 MG/5ML PO ELIX: 12.5000 mg | ORAL_SOLUTION | ORAL | Status: DC | PRN

## 2016-05-22 MED ORDER — MIDAZOLAM HCL 5 MG/5ML IJ SOLN
INTRAMUSCULAR | Status: DC | PRN
  Administered 2016-05-22: 2 mg via INTRAVENOUS

## 2016-05-22 MED ORDER — HYDROMORPHONE HCL 1 MG/ML IJ SOLN: 0.5000 mg | INTRAMUSCULAR | Status: DC | PRN

## 2016-05-22 MED ORDER — BUPIVACAINE HCL (PF) 0.25 % IJ SOLN
INTRAMUSCULAR | Status: DC | PRN
  Administered 2016-05-22: 30 mL

## 2016-05-22 MED ORDER — SODIUM CHLORIDE 0.9 % IV SOLN: INTRAVENOUS | Status: DC

## 2016-05-22 MED ORDER — BUPIVACAINE HCL (PF) 0.25 % IJ SOLN
INTRAMUSCULAR | Status: AC
Start: 2016-05-22 — End: 2016-05-22
  Filled 2016-05-22: qty 30

## 2016-05-22 MED ORDER — FENTANYL CITRATE (PF) 100 MCG/2ML IJ SOLN
INTRAMUSCULAR | Status: AC
  Filled 2016-05-22: qty 2

## 2016-05-22 MED ORDER — DILTIAZEM HCL ER COATED BEADS 240 MG PO TB24
240.0000 mg | ORAL_TABLET | Freq: Every day | ORAL | Status: DC
  Filled 2016-05-22: qty 1

## 2016-05-22 MED ORDER — SENNA 8.6 MG PO TABS
2.0000 | ORAL_TABLET | Freq: Every day | ORAL | Status: DC
  Administered 2016-05-22: 22:00:00 17.2 mg via ORAL
  Filled 2016-05-22: qty 2

## 2016-05-22 MED ORDER — KETOROLAC TROMETHAMINE 15 MG/ML IJ SOLN
7.5000 mg | Freq: Four times a day (QID) | INTRAMUSCULAR | Status: DC
  Administered 2016-05-22 – 2016-05-23 (×2): 7.5 mg via INTRAVENOUS
  Filled 2016-05-22 (×3): qty 1

## 2016-05-22 MED ORDER — ISOPROPYL ALCOHOL 70 % SOLN
Status: DC | PRN
  Administered 2016-05-22: 1 via TOPICAL

## 2016-05-22 MED ORDER — ISOPROPYL ALCOHOL 70 % SOLN
Status: AC
Start: 2016-05-22 — End: 2016-05-22
  Filled 2016-05-22: qty 480

## 2016-05-22 MED ORDER — ONDANSETRON HCL 4 MG/2ML IJ SOLN
INTRAMUSCULAR | Status: DC | PRN
  Administered 2016-05-22: 4 mg via INTRAVENOUS

## 2016-05-22 MED ORDER — METOCLOPRAMIDE HCL 5 MG/ML IJ SOLN: 5.0000 mg | Freq: Three times a day (TID) | INTRAMUSCULAR | Status: DC | PRN

## 2016-05-22 MED ORDER — APIXABAN 2.5 MG PO TABS: 2.5000 mg | ORAL_TABLET | Freq: Two times a day (BID) | ORAL | Status: DC

## 2016-05-22 MED ORDER — METOCLOPRAMIDE HCL 5 MG PO TABS: 5.0000 mg | ORAL_TABLET | Freq: Three times a day (TID) | ORAL | Status: DC | PRN

## 2016-05-22 MED ORDER — PHENYLEPHRINE 40 MCG/ML (10ML) SYRINGE FOR IV PUSH (FOR BLOOD PRESSURE SUPPORT)
PREFILLED_SYRINGE | INTRAVENOUS | Status: AC
  Filled 2016-05-22: qty 10

## 2016-05-22 MED ORDER — SODIUM CHLORIDE 0.9 % IV SOLN
INTRAVENOUS | Status: DC
  Administered 2016-05-22: 19:00:00 via INTRAVENOUS

## 2016-05-22 MED ORDER — METHOCARBAMOL 500 MG PO TABS: 500.0000 mg | ORAL_TABLET | Freq: Four times a day (QID) | ORAL | Status: DC | PRN

## 2016-05-22 MED ORDER — ACETAMINOPHEN 650 MG RE SUPP: 650.0000 mg | Freq: Four times a day (QID) | RECTAL | Status: DC | PRN

## 2016-05-22 MED ORDER — LIDOCAINE HCL (CARDIAC) 20 MG/ML IV SOLN
INTRAVENOUS | Status: DC | PRN
  Administered 2016-05-22: 50 mg via INTRAVENOUS

## 2016-05-22 MED ORDER — SODIUM CHLORIDE 0.9 % IV SOLN
1000.0000 mg | INTRAVENOUS | Status: AC
  Administered 2016-05-22: 1000 mg via INTRAVENOUS
  Filled 2016-05-22: qty 1100

## 2016-05-22 MED ORDER — DOCUSATE SODIUM 100 MG PO CAPS
100.0000 mg | ORAL_CAPSULE | Freq: Two times a day (BID) | ORAL | Status: DC
  Administered 2016-05-22: 100 mg via ORAL
  Filled 2016-05-22 (×2): qty 1

## 2016-05-22 MED ORDER — POVIDONE-IODINE 10 % EX SWAB: 2.0000 "application " | Freq: Once | CUTANEOUS | Status: DC

## 2016-05-22 MED ORDER — KETOROLAC TROMETHAMINE 30 MG/ML IJ SOLN
INTRAMUSCULAR | Status: DC | PRN
  Administered 2016-05-22: 30 mg

## 2016-05-22 MED ORDER — POLYETHYLENE GLYCOL 3350 17 G PO PACK: 17.0000 g | PACK | Freq: Every day | ORAL | Status: DC | PRN

## 2016-05-22 SURGICAL SUPPLY — 47 items
ADH SKN CLS APL DERMABOND .7 (GAUZE/BANDAGES/DRESSINGS) ×2
BAG DECANTER FOR FLEXI CONT (MISCELLANEOUS) IMPLANT
BAG SPEC THK2 15X12 ZIP CLS (MISCELLANEOUS)
BAG ZIPLOCK 12X15 (MISCELLANEOUS) IMPLANT
CAPT HIP TOTAL 2 ×2 IMPLANT
CHLORAPREP W/TINT 26ML (MISCELLANEOUS) ×3 IMPLANT
CLOTH BEACON ORANGE TIMEOUT ST (SAFETY) ×3 IMPLANT
COVER PERINEAL POST (MISCELLANEOUS) ×3 IMPLANT
COVER SURGICAL LIGHT HANDLE (MISCELLANEOUS) ×3 IMPLANT
DECANTER SPIKE VIAL GLASS SM (MISCELLANEOUS) ×3 IMPLANT
DERMABOND ADVANCED (GAUZE/BANDAGES/DRESSINGS) ×4
DERMABOND ADVANCED .7 DNX12 (GAUZE/BANDAGES/DRESSINGS) ×2 IMPLANT
DRAPE SHEET LG 3/4 BI-LAMINATE (DRAPES) ×9 IMPLANT
DRAPE STERI IOBAN 125X83 (DRAPES) ×3 IMPLANT
DRAPE U-SHAPE 47X51 STRL (DRAPES) ×6 IMPLANT
DRSG AQUACEL AG ADV 3.5X10 (GAUZE/BANDAGES/DRESSINGS) ×3 IMPLANT
ELECT PENCIL ROCKER SW 15FT (MISCELLANEOUS) ×3 IMPLANT
ELECT REM PT RETURN 15FT ADLT (MISCELLANEOUS) ×3 IMPLANT
GAUZE SPONGE 4X4 12PLY STRL (GAUZE/BANDAGES/DRESSINGS) ×3 IMPLANT
GLOVE BIO SURGEON STRL SZ8.5 (GLOVE) ×6 IMPLANT
GLOVE BIOGEL PI IND STRL 8.5 (GLOVE) ×1 IMPLANT
GLOVE BIOGEL PI INDICATOR 8.5 (GLOVE) ×2
GOWN SPEC L3 XXLG W/TWL (GOWN DISPOSABLE) ×3 IMPLANT
HANDPIECE INTERPULSE COAX TIP (DISPOSABLE) ×3
HEAD CERAMIC 36 PLUS5 (Hips) IMPLANT
HOLDER FOLEY CATH W/STRAP (MISCELLANEOUS) ×3 IMPLANT
HOOD PEEL AWAY FLYTE STAYCOOL (MISCELLANEOUS) ×6 IMPLANT
MARKER SKIN DUAL TIP RULER LAB (MISCELLANEOUS) ×3 IMPLANT
NDL SPNL 18GX3.5 QUINCKE PK (NEEDLE) ×1 IMPLANT
NEEDLE SPNL 18GX3.5 QUINCKE PK (NEEDLE) ×3 IMPLANT
PACK ANTERIOR HIP CUSTOM (KITS) ×3 IMPLANT
SAW OSC TIP CART 19.5X105X1.3 (SAW) ×3 IMPLANT
SEALER BIPOLAR AQUA 6.0 (INSTRUMENTS) ×3 IMPLANT
SET HNDPC FAN SPRY TIP SCT (DISPOSABLE) ×1 IMPLANT
SLEEVE SURGEON STRL (DRAPES) ×2 IMPLANT
SUT ETHIBOND NAB CT1 #1 30IN (SUTURE) ×6 IMPLANT
SUT MNCRL AB 3-0 PS2 18 (SUTURE) ×3 IMPLANT
SUT MON AB 2-0 CT1 36 (SUTURE) ×6 IMPLANT
SUT STRATAFIX PDO 1 14 VIOLET (SUTURE) ×3
SUT STRATFX PDO 1 14 VIOLET (SUTURE) ×1
SUT VIC AB 2-0 CT1 27 (SUTURE) ×3
SUT VIC AB 2-0 CT1 TAPERPNT 27 (SUTURE) ×1 IMPLANT
SUTURE STRATFX PDO 1 14 VIOLET (SUTURE) ×1 IMPLANT
SYR 50ML LL SCALE MARK (SYRINGE) ×1 IMPLANT
TRAY FOLEY W/METER SILVER 16FR (SET/KITS/TRAYS/PACK) ×2 IMPLANT
WATER STERILE IRR 1500ML POUR (IV SOLUTION) ×3 IMPLANT
YANKAUER SUCT BULB TIP 10FT TU (MISCELLANEOUS) ×3 IMPLANT

## 2016-05-22 NOTE — Interval H&P Note (Signed)
History and Physical Interval Note:  05/22/2016 12:32 PM  Jacob Sanders  has presented today for surgery, with the diagnosis of Degenerative joint disease, left hip  The various methods of treatment have been discussed with the patient and family. After consideration of risks, benefits and other options for treatment, the patient has consented to  Procedure(s) with comments: LEFT TOTAL HIP ARTHROPLASTY ANTERIOR APPROACH (Left) - Dr. requesting RNFA as a surgical intervention .  The patient's history has been reviewed, patient examined, no change in status, stable for surgery.  I have reviewed the patient's chart and labs.  Questions were answered to the patient's satisfaction.     Carlyne Keehan, Cloyde ReamsBrian James

## 2016-05-22 NOTE — Discharge Instructions (Signed)
°Dr. Keldan Eplin °Joint Replacement Specialist °[facility] °[address] °[place], [place] [address] °[phone] ° ° °TOTAL HIP REPLACEMENT POSTOPERATIVE DIRECTIONS ° ° ° °Hip Rehabilitation, Guidelines Following Surgery  ° °WEIGHT BEARING °Weight bearing as tolerated with assist device (walker, cane, etc) as directed, use it as long as suggested by your surgeon or therapist, typically at least 4-6 weeks. ° °The results of a hip operation are greatly improved after range of motion and muscle strengthening exercises. Follow all safety measures which are given to protect your hip. If any of these exercises cause increased pain or swelling in your joint, decrease the amount until you are comfortable again. Then slowly increase the exercises. Call your caregiver if you have problems or questions.  ° °HOME CARE INSTRUCTIONS  °Most of the following instructions are designed to prevent the dislocation of your new hip.  °Remove items at home which could result in a fall. This includes throw rugs or furniture in walking pathways.  °Continue medications as instructed at time of discharge. °· You may have some home medications which will be placed on hold until you complete the course of blood thinner medication. °· You may start showering once you are discharged home. Do not remove your dressing. °Do not put on socks or shoes without following the instructions of your caregivers.   °Sit on chairs with arms. Use the chair arms to help push yourself up when arising.  °Arrange for the use of a toilet seat elevator so you are not sitting low.  °· Walk with walker as instructed.  °You may resume a sexual relationship in one month or when given the OK by your caregiver.  °Use walker as long as suggested by your caregivers.  °You may put full weight on your legs and walk as much as is comfortable. °Avoid periods of inactivity such as sitting longer than an hour when not asleep. This helps prevent  blood clots.  °You may return to work once you are cleared by your surgeon.  °Do not drive a car for 6 weeks or until released by your surgeon.  °Do not drive while taking narcotics.  °Wear elastic stockings for two weeks following surgery during the day but you may remove then at night.  °Make sure you keep all of your appointments after your operation with all of your doctors and caregivers. You should call the office at the above phone number and make an appointment for approximately two weeks after the date of your surgery. °Please pick up a stool softener and laxative for home use as long as you are requiring pain medications. °· ICE to the affected hip every three hours for 30 minutes at a time and then as needed for pain and swelling. Continue to use ice on the hip for pain and swelling from surgery. You may notice swelling that will progress down to the foot and ankle.  This is normal after surgery.  Elevate the leg when you are not up walking on it.   °It is important for you to complete the blood thinner medication as prescribed by your doctor. °· Continue to use the breathing machine which will help keep your temperature down.  It is common for your temperature to cycle up and down following surgery, especially at night when you are not up moving around and exerting yourself.  The breathing machine keeps your lungs expanded and your temperature down. ° °RANGE OF MOTION AND STRENGTHENING EXERCISES  °These exercises are   designed to help you keep full movement of your hip joint. Follow your caregiver's or physical therapist's instructions. Perform all exercises about fifteen times, three times per day or as directed. Exercise both hips, even if you have had only one joint replacement. These exercises can be done on a training (exercise) mat, on the floor, on a table or on a bed. Use whatever works the best and is most comfortable for you. Use music or television while you are exercising so that the exercises  are a pleasant break in your day. This will make your life better with the exercises acting as a break in routine you can look forward to.  °Lying on your back, slowly slide your foot toward your buttocks, raising your knee up off the floor. Then slowly slide your foot back down until your leg is straight again.  °Lying on your back spread your legs as far apart as you can without causing discomfort.  °Lying on your side, raise your upper leg and foot straight up from the floor as far as is comfortable. Slowly lower the leg and repeat.  °Lying on your back, tighten up the muscle in the front of your thigh (quadriceps muscles). You can do this by keeping your leg straight and trying to raise your heel off the floor. This helps strengthen the largest muscle supporting your knee.  °Lying on your back, tighten up the muscles of your buttocks both with the legs straight and with the knee bent at a comfortable angle while keeping your heel on the floor.  ° °SKILLED REHAB INSTRUCTIONS: °If the patient is transferred to a skilled rehab facility following release from the hospital, a list of the current medications will be sent to the facility for the patient to continue.  When discharged from the skilled rehab facility, please have the facility set up the patient's Home Health Physical Therapy prior to being released. Also, the skilled facility will be responsible for providing the patient with their medications at time of release from the facility to include their pain medication and their blood thinner medication. If the patient is still at the rehab facility at time of the two week follow up appointment, the skilled rehab facility will also need to assist the patient in arranging follow up appointment in our office and any transportation needs. ° °MAKE SURE YOU:  °Understand these instructions.  °Will watch your condition.  °Will get help right away if you are not doing well or get worse. ° °Pick up stool softner and  laxative for home use following surgery while on pain medications. °Do not remove your dressing. °The dressing is waterproof--it is OK to take showers. °Continue to use ice for pain and swelling after surgery. °Do not use any lotions or creams on the incision until instructed by your surgeon. °Total Hip Protocol. ° ° °

## 2016-05-22 NOTE — Anesthesia Postprocedure Evaluation (Addendum)
Anesthesia Post Note  Patient: Jacob Sanders  Procedure(s) Performed: Procedure(s) (LRB): LEFT TOTAL HIP ARTHROPLASTY ANTERIOR APPROACH (Left)  Patient location during evaluation: PACU Anesthesia Type: MAC and Spinal Level of consciousness: awake, awake and alert and oriented Pain management: pain level controlled Vital Signs Assessment: post-procedure vital signs reviewed and stable Respiratory status: spontaneous breathing, nonlabored ventilation and respiratory function stable Cardiovascular status: blood pressure returned to baseline Anesthetic complications: no       Last Vitals:  Vitals:   05/22/16 1740 05/22/16 1841  BP: (!) 130/95 131/84  Pulse: 72 69  Resp: 16 16  Temp: 36.4 C 36.4 C    Last Pain:  Vitals:   05/22/16 1841  TempSrc: Oral  PainSc:                  Darya Bigler COKER

## 2016-05-22 NOTE — Transfer of Care (Signed)
Immediate Anesthesia Transfer of Care Note  Patient: Jacob Sanders  Procedure(s) Performed: Procedure(s) with comments: LEFT TOTAL HIP ARTHROPLASTY ANTERIOR APPROACH (Left) - Dr. requesting RNFA  Patient Location: PACU  Anesthesia Type:Regional  Level of Consciousness:  sedated, patient cooperative and responds to stimulation  Airway & Oxygen Therapy:Patient Spontanous Breathing and Patient connected to face mask oxgen  Post-op Assessment:  Report given to PACU RN and Post -op Vital signs reviewed and stable  Post vital signs:  Reviewed and stable  Last Vitals:  Vitals:   05/22/16 1031  BP: (!) 151/97  Pulse: 87  Resp: 18  Temp: 36.5 C    Complications: No apparent anesthesia complications

## 2016-05-22 NOTE — H&P (View-Only) (Signed)
TOTAL HIP ADMISSION H&P  Patient is admitted for left total hip arthroplasty.  Subjective:  Chief Complaint: left hip pain  HPI: Jacob Sanders, 71 y.o. male, has a history of pain and functional disability in the left hip(s) due to arthritis and patient has failed non-surgical conservative treatments for greater than 12 weeks to include NSAID's and/or analgesics, corticosteriod injections, flexibility and strengthening excercises, use of assistive devices, weight reduction as appropriate and activity modification.  Onset of symptoms was gradual starting 5 years ago with rapidlly worsening course since that time.The patient noted no past surgery on the left hip(s).  Patient currently rates pain in the left hip at 10 out of 10 with activity. Patient has night pain, worsening of pain with activity and weight bearing, pain that interfers with activities of daily living, pain with passive range of motion and joint swelling. Patient has evidence of subchondral cysts, subchondral sclerosis, periarticular osteophytes and joint space narrowing by imaging studies. This condition presents safety issues increasing the risk of falls. There is no current active infection.  Patient Active Problem List   Diagnosis Date Noted  . Osteoarthritis, hip, bilateral 03/27/2016  . Atrial fibrillation (HCC) 03/21/2016  . Overweight (BMI 25.0-29.9) 03/21/2016  . Pain in both lower extremities 02/27/2016  . Tachycardia 02/27/2016  . History of atrial fibrillation without current medication 02/27/2016  . Primary osteoarthritis of both knees 11/06/2015  . Current smoker 12/14/2014  . Vitamin D deficiency 05/17/2014  . GERD (gastroesophageal reflux disease) 05/15/2014  . Fatigue 03/10/2014  . Arthritis 03/10/2014  . Essential hypertension, benign 04/20/2012  . Gout, unspecified 04/20/2012  . Hyperlipemia 04/20/2012   Past Medical History:  Diagnosis Date  . Arthritis   . GERD (gastroesophageal reflux disease)   .  Hyperlipidemia   . Hypertension     Past Surgical History:  Procedure Laterality Date  . SPINE SURGERY     Resulting staph infection     (Not in a hospital admission) Allergies  Allergen Reactions  . Prednisone Swelling    Social History  Substance Use Topics  . Smoking status: Current Every Day Smoker    Packs/day: 0.50    Years: 30.00    Types: Cigars  . Smokeless tobacco: Never Used  . Alcohol use No    Family History  Problem Relation Age of Onset  . Aneurysm Father      Review of Systems  Constitutional: Positive for malaise/fatigue.  HENT: Negative.   Eyes: Negative.   Respiratory: Negative.   Cardiovascular: Negative.   Gastrointestinal: Negative.   Genitourinary: Negative.   Musculoskeletal: Positive for back pain and joint pain.  Skin: Positive for itching. Negative for rash.  Neurological: Negative.   Endo/Heme/Allergies: Positive for environmental allergies.  Psychiatric/Behavioral: The patient is nervous/anxious.     Objective:  Physical Exam  Vitals reviewed. Constitutional: He is oriented to person, place, and time. He appears well-developed and well-nourished.  HENT:  Head: Normocephalic and atraumatic.  Eyes: Conjunctivae and EOM are normal. Pupils are equal, round, and reactive to light.  Neck: Normal range of motion. Neck supple. No thyromegaly present.  Cardiovascular: Normal rate and intact distal pulses.   Respiratory: Effort normal and breath sounds normal.  GI: He exhibits no distension.  Genitourinary:  Genitourinary Comments: deferred  Neurological: He is alert and oriented to person, place, and time. He has normal reflexes.  Skin: Skin is warm and dry.  Psychiatric: He has a normal mood and affect. His behavior is normal. Judgment and  thought content normal.    Vital signs in last 24 hours: @  Labs:   Estimated body mass index is 26.11 kg/m as calculated from the following:   Height as of 05/06/16:  (1.778  m).   Weight as of 05/06/16: 82.6 kg (182 lb).   Imaging Review Plain radiographs demonstrate severe degenerative joint disease of the left hip(s). The bone quality appears to be adequate for age and reported activity level.  Assessment/Plan:  End stage arthritis, left hip(s)  The patient history, physical examination, clinical judgement of the provider and imaging studies are consistent with end stage degenerative joint disease of the left hip(s) and total hip arthroplasty is deemed medically necessary. The treatment options including medical management, injection therapy, arthroscopy and arthroplasty were discussed at length. The risks and benefits of total hip arthroplasty were presented and reviewed. The risks due to aseptic loosening, infection, stiffness, dislocation/subluxation,  thromboembolic complications and other imponderables were discussed.  The patient acknowledged the explanation, agreed to proceed with the plan and consent was signed. Patient is being admitted for inpatient treatment for surgery, pain control, PT, OT, prophylactic antibiotics, VTE prophylaxis, progressive ambulation and ADL's and discharge planning.The patient is planning to be discharged home with HEP. Already has DME

## 2016-05-22 NOTE — Anesthesia Preprocedure Evaluation (Signed)
Anesthesia Evaluation  Patient identified by MRN, date of birth, ID band Patient awake    Reviewed: Allergy & Precautions, NPO status , Patient's Chart, lab work & pertinent test results  Airway Mallampati: II  TM Distance: >3 FB Neck ROM: Full    Dental  (+) Teeth Intact, Dental Advisory Given   Pulmonary Current Smoker,    breath sounds clear to auscultation       Cardiovascular hypertension,  Rhythm:Regular Rate:Normal     Neuro/Psych    GI/Hepatic   Endo/Other    Renal/GU      Musculoskeletal   Abdominal   Peds  Hematology   Anesthesia Other Findings   Reproductive/Obstetrics                             Anesthesia Physical Anesthesia Plan  ASA: III  Anesthesia Plan: MAC and Spinal   Post-op Pain Management:    Induction: Intravenous  Airway Management Planned: Natural Airway and Simple Face Mask  Additional Equipment:   Intra-op Plan:   Post-operative Plan:   Informed Consent: I have reviewed the patients History and Physical, chart, labs and discussed the procedure including the risks, benefits and alternatives for the proposed anesthesia with the patient or authorized representative who has indicated his/her understanding and acceptance.   Dental advisory given  Plan Discussed with: CRNA and Anesthesiologist  Anesthesia Plan Comments:         Anesthesia Quick Evaluation

## 2016-05-22 NOTE — Op Note (Signed)
OPERATIVE REPORT  SURGEON: Samson Frederic, MD   ASSISTANT: Staff.  PREOPERATIVE DIAGNOSIS: Left hip arthritis.   POSTOPERATIVE DIAGNOSIS: Left hip arthritis.   PROCEDURE: Left total hip arthroplasty, anterior approach.   IMPLANTS: DePuy Tri Lock stem, size 8, hi offset. DePuy Pinnacle Cup, size 54 mm. DePuy Altrx liner, size 36 by 54 mm, neutral. DePuy Biolox ceramic head ball, size 36 + 1.5 mm.  ANESTHESIA:  Spinal  ESTIMATED BLOOD LOSS: 350 mL.  ANTIBIOTICS: 2 g Ancef.  DRAINS: None.  COMPLICATIONS: None.   CONDITION: PACU - hemodynamically stable.   BRIEF CLINICAL NOTE: Jacob Sanders is a 71 y.o. male with a long-standing history of Left hip arthritis. After failing conservative management, the patient was indicated for total hip arthroplasty. The risks, benefits, and alternatives to the procedure were explained, and the patient elected to proceed.  PROCEDURE IN DETAIL: Surgical site was marked by myself in the pre-op holding area. Once inside the operating room, spinal anesthesia was obtained, and a foley catheter was inserted. The patient was then positioned on the Hana table. All bony prominences were well padded. The hip was prepped and draped in the normal sterile surgical fashion. A time-out was called verifying side and site of surgery. The patient received IV antibiotics within 60 minutes of beginning the procedure.  The direct anterior approach to the hip was performed through the Hueter interval. Lateral femoral circumflex vessels were treated with the Auqumantys. The anterior capsule was exposed and an inverted T capsulotomy was made.The femoral neck cut was made to the level of the templated cut. A corkscrew was placed into the head and the head was removed. The femoral head was found to have eburnated bone. The head was passed to the back table and was measured.  Acetabular exposure was achieved, and the pulvinar and labrum were excised. Sequental  reaming of the acetabulum was then performed up to a size 53 mm reamer. A 54 mm cup was then opened and impacted into place at approximately 40 degrees of abduction and 20 degrees of anteversion. The final polyethylene liner was impacted into place and acetabular osteophytes were removed.   I then gained femoral exposure taking care to protect the abductors and greater trochanter. This was performed using standard external rotation, extension, and adduction. The capsule was peeled off the inner aspect of the greater trochanter, taking care to preserve the short external rotators. A cookie cutter was used to enter the femoral canal, and then the femoral canal finder was placed. Sequential broaching was performed up to a size 8. Calcar planer was used on the femoral neck remnant. I placed a hi offset neck and a trial head ball. The hip was reduced. Leg lengths and offset were checked fluoroscopically. The hip was dislocated and trial components were removed. The final implants were placed, and the hip was reduced.  Fluoroscopy was used to confirm component position and leg lengths. At 90 degrees of external rotation and full extension, the hip was stable to an anterior directed force.  The wound was copiously irrigated with normal saline using pulse lavage. Marcaine solution was injected into the periarticular soft tissue. The wound was closed in layers using #1 Vicryl and V-Loc for the fascia, 2-0 Vicryl for the subcutaneous fat, 2-0 Monocryl for the deep dermal layer, 3-0 running Monocryl subcuticular stitch, and Dermabond for the skin. Once the glue was fully dried, an Aquacell Ag dressing was applied. The patient was transported to the recovery room in stable condition.  Sponge, needle, and instrument counts were correct at the end of the case x2. The patient tolerated the procedure well and there were no known complications.

## 2016-05-22 NOTE — Anesthesia Procedure Notes (Signed)
Spinal  Start time: 05/22/2016 12:50 PM End time: 05/22/2016 12:54 PM Staffing Anesthesiologist: Noreene LarssonJOSLIN, DAVID Resident/CRNA: Vanessa DurhamOCHRAN, Joshva Labreck GLENN Performed: anesthesiologist  Preanesthetic Checklist Completed: patient identified, site marked, surgical consent, IV checked, risks and benefits discussed and monitors and equipment checked Spinal Block Patient position: sitting Prep: ChloraPrep and site prepped and draped Patient monitoring: heart rate, continuous pulse ox and blood pressure Approach: right paramedian Location: L3-4 Needle Needle gauge: 22 G Needle length: 9 cm Needle insertion depth: 7 cm Assessment Sensory level: T6 Additional Notes First attempt  midline by Berna Bueochran CRNA,  Positive heme thru 24 gauge pencan needle      Needle withdrawn     SAB sucessful by Dr Noreene LarssonJoslin with right paramedian approach

## 2016-05-23 ENCOUNTER — Encounter (HOSPITAL_COMMUNITY): Payer: Self-pay | Admitting: Orthopedic Surgery

## 2016-05-23 LAB — CBC
HCT: 37.7 % — ABNORMAL LOW (ref 39.0–52.0)
HEMOGLOBIN: 12.5 g/dL — AB (ref 13.0–17.0)
MCH: 31.6 pg (ref 26.0–34.0)
MCHC: 33.2 g/dL (ref 30.0–36.0)
MCV: 95.4 fL (ref 78.0–100.0)
Platelets: 224 10*3/uL (ref 150–400)
RBC: 3.95 MIL/uL — AB (ref 4.22–5.81)
RDW: 13.1 % (ref 11.5–15.5)
WBC: 14.7 10*3/uL — ABNORMAL HIGH (ref 4.0–10.5)

## 2016-05-23 LAB — BASIC METABOLIC PANEL
ANION GAP: 8 (ref 5–15)
BUN: 15 mg/dL (ref 6–20)
CHLORIDE: 105 mmol/L (ref 101–111)
CO2: 24 mmol/L (ref 22–32)
Calcium: 8.4 mg/dL — ABNORMAL LOW (ref 8.9–10.3)
Creatinine, Ser: 0.65 mg/dL (ref 0.61–1.24)
GFR calc non Af Amer: >60 mL/min (ref >60)
Glucose, Bld: 159 mg/dL — ABNORMAL HIGH (ref 65–99)
POTASSIUM: 3.4 mmol/L — AB (ref 3.5–5.1)
SODIUM: 137 mmol/L (ref 135–145)

## 2016-05-23 MED ORDER — WARFARIN SODIUM 5 MG PO TABS: 5.0000 mg | ORAL_TABLET | Freq: Every day | ORAL | Status: DC

## 2016-05-23 MED ORDER — HYDROCODONE-ACETAMINOPHEN 5-325 MG PO TABS: 1.0000 | ORAL_TABLET | ORAL | 0 refills | Status: DC | PRN

## 2016-05-23 MED ORDER — DILTIAZEM HCL ER COATED BEADS 240 MG PO CP24
240.0000 mg | ORAL_CAPSULE | Freq: Every day | ORAL | Status: DC
  Filled 2016-05-23: qty 1

## 2016-05-23 MED ORDER — ONDANSETRON HCL 4 MG PO TABS: 4.0000 mg | ORAL_TABLET | Freq: Four times a day (QID) | ORAL | 0 refills | Status: DC | PRN

## 2016-05-23 MED ORDER — SENNA 8.6 MG PO TABS: 2.0000 | ORAL_TABLET | Freq: Every day | ORAL | 0 refills | Status: DC

## 2016-05-23 MED ORDER — WARFARIN SODIUM 5 MG PO TABS: 5.0000 mg | ORAL_TABLET | Freq: Every day | ORAL | 0 refills | Status: DC

## 2016-05-23 MED ORDER — DOCUSATE SODIUM 100 MG PO CAPS: 100.0000 mg | ORAL_CAPSULE | Freq: Two times a day (BID) | ORAL | 1 refills | Status: DC

## 2016-05-23 MED ORDER — WARFARIN - PHYSICIAN DOSING INPATIENT: Freq: Every day | Status: DC

## 2016-05-23 NOTE — Progress Notes (Signed)
Physical Therapy Treatment Patient Details Name: Jacob Sanders MRN: [id number] DOB: 10-Mar-1945 Today's Date: 05/23/2016    History of Present Illness s/p L DA THA    PT Comments    Pt progressing well with mobility and eager for dc home.  Spouse present and reviewed stairs, car transfers and home therex with progression.  Follow Up Recommendations  Home health PT     Equipment Recommendations  None recommended by PT    Recommendations for Other Services OT consult     Precautions / Restrictions Precautions Precautions: Fall Restrictions Weight Bearing Restrictions: No Other Position/Activity Restrictions: WBAT    Mobility  Bed Mobility Overal bed mobility: Needs Assistance Bed Mobility: Supine to Sit;Sit to Supine     Supine to sit: Supervision Sit to supine: Supervision   General bed mobility comments: cues for sequence and use of R LE to self assist  Transfers Overall transfer level: Needs assistance Equipment used: Rolling walker (2 wheeled) Transfers: Sit to/from Stand Sit to Stand: Supervision         General transfer comment: cues for LE management and use of UEs to self assist  Ambulation/Gait Ambulation/Gait assistance: Min guard;Supervision Ambulation Distance (Feet): 150 Feet Assistive device: Rolling walker (2 wheeled) Gait Pattern/deviations: Step-to pattern;Step-through pattern;Decreased step length - right;Decreased step length - left;Shuffle;Trunk flexed Gait velocity: decr Gait velocity interpretation: Below normal speed for age/gender General Gait Details: min cues for posture, position from RW and initial sequence   Stairs Stairs: Yes   Stair Management: No rails;Two rails;Step to pattern;Forwards;With walker Number of Stairs: 5 General stair comments: single step x 3 with RW, 2 steps with bil rails.  Cues for sequence and foot/RW placement  Wheelchair Mobility    Modified Rankin (Stroke Patients Only)       Balance  Overall balance assessment: No apparent balance deficits (not formally assessed)                                          Cognition Arousal/Alertness: Awake/alert Behavior During Therapy: WFL for tasks assessed/performed Overall Cognitive Status: Within Functional Limits for tasks assessed                                        Exercises Total Joint Exercises Ankle Circles/Pumps: AROM;Both;20 reps;Supine Quad Sets: AROM;Both;10 reps;Supine Heel Slides: AAROM;Left;20 reps;Supine Hip ABduction/ADduction: AAROM;Left;15 reps;Supine Long Arc Quad: AROM;Left;10 reps;Seated    General Comments        Pertinent Vitals/Pain Pain Assessment: 0-10 Pain Score: 2  Pain Location: L hip  Pain Descriptors / Indicators: Sore Pain Intervention(s): Limited activity within patient's tolerance;Monitored during session    Home Living Family/patient expects to be discharged to:: Private residence Living Arrangements: Spouse/significant other Available Help at Discharge: Family Type of Home: House Home Access: Stairs to enter Entrance Stairs-Rails: Right Home Layout: One level Home Equipment: Bedside commode;Walker - 2 wheels      Prior Function Level of Independence: Independent with assistive device(s)      Comments: uses cane   PT Goals (current goals can now be found in the care plan section) Acute Rehab PT Goals Patient Stated Goal: home and hopefully not need R hip done Progress towards PT goals: Progressing toward goals    Frequency    7X/week  PT Plan Current plan remains appropriate    Co-evaluation              AM-PAC PT "6 Clicks" Daily Activity  Outcome Measure  Difficulty turning over in bed (including adjusting bedclothes, sheets and blankets)?: A Little Difficulty moving from lying on back to sitting on the side of the bed? : A Little Difficulty sitting down on and standing up from a chair with arms (e.g.,  wheelchair, bedside commode, etc,.)?: A Little Help needed moving to and from a bed to chair (including a wheelchair)?: A Little Help needed walking in hospital room?: A Little Help needed climbing 3-5 steps with a railing? : A Little 6 Click Score: 18    End of Session Equipment Utilized During Treatment: Gait belt Activity Tolerance: Patient tolerated treatment well Patient left: in chair;with call bell/phone within reach Nurse Communication: Mobility status PT Visit Diagnosis: Difficulty in walking, not elsewhere classified (R26.2)     Time: 6962-95281140-1223 PT Time Calculation (min) (ACUTE ONLY): 43 min  Charges:  $Gait Training: 8-22 mins $Therapeutic Exercise: 8-22 mins $Therapeutic Activity: 8-22 mins                    G Codes:       Pg [phone]    Marchell Froman 05/23/2016, 12:54 PM

## 2016-05-23 NOTE — Progress Notes (Signed)
Physical Therapy Treatment Patient Details Name: Jacob Sanders MRN: [id number] DOB: [birth year] Today's Date: 05/23/2016    History of Present Illness s/p L DA THA    PT Comments    Pt progressing well with mobility and hopeful for dc home today.   Follow Up Recommendations  Home health PT     Equipment Recommendations  None recommended by PT    Recommendations for Other Services OT consult     Precautions / Restrictions Precautions Precautions: Fall Restrictions Weight Bearing Restrictions: No Other Position/Activity Restrictions: WBAT    Mobility  Bed Mobility Overal bed mobility: Needs Assistance Bed Mobility: Supine to Sit     Supine to sit: Min assist     General bed mobility comments: cues for sequence and use of R LE to self assist  Transfers Overall transfer level: Needs assistance Equipment used: Rolling walker (2 wheeled) Transfers: Sit to/from Stand Sit to Stand: Min guard         General transfer comment: cues for LE management and use of UEs to self assist  Ambulation/Gait Ambulation/Gait assistance: Min guard Ambulation Distance (Feet): 150 Feet Assistive device: Rolling walker (2 wheeled) Gait Pattern/deviations: Step-to pattern;Step-through pattern;Decreased step length - right;Decreased step length - left;Shuffle;Trunk flexed Gait velocity: decr Gait velocity interpretation: Below normal speed for age/gender General Gait Details: cues for posture, position from RW and initial sequence   Stairs            Wheelchair Mobility    Modified Rankin (Stroke Patients Only)       Balance Overall balance assessment: No apparent balance deficits (not formally assessed)                                          Cognition Arousal/Alertness: Awake/alert Behavior During Therapy: WFL for tasks assessed/performed Overall Cognitive Status: Within Functional Limits for tasks assessed                                         Exercises Total Joint Exercises Ankle Circles/Pumps: AROM;Both;20 reps;Supine Quad Sets: AROM;Both;10 reps;Supine Heel Slides: AAROM;Left;20 reps;Supine Hip ABduction/ADduction: AAROM;Left;15 reps;Supine    General Comments        Pertinent Vitals/Pain Pain Assessment: 0-10 Pain Score: 2  Pain Location: L hip  Pain Descriptors / Indicators: Sore Pain Intervention(s): Limited activity within patient's tolerance;Monitored during session    Home Living Family/patient expects to be discharged to:: Private residence Living Arrangements: Spouse/significant other Available Help at Discharge: Family Type of Home: House Home Access: Stairs to enter Entrance Stairs-Rails: Right Home Layout: One level Home Equipment: Bedside commode;Walker - 2 wheels      Prior Function Level of Independence: Independent with assistive device(s)      Comments: uses cane   PT Goals (current goals can now be found in the care plan section) Acute Rehab PT Goals Patient Stated Goal: home and hopefully not need R hip done Progress towards PT goals: Progressing toward goals    Frequency    7X/week      PT Plan Current plan remains appropriate    Co-evaluation              AM-PAC PT "6 Clicks" Daily Activity  Outcome Measure  Difficulty turning over in bed (including adjusting bedclothes, sheets and blankets)?: A  Little Difficulty moving from lying on back to sitting on the side of the bed? : A Little Difficulty sitting down on and standing up from a chair with arms (e.g., wheelchair, bedside commode, etc,.)?: A Little Help needed moving to and from a bed to chair (including a wheelchair)?: A Little Help needed walking in hospital room?: A Little Help needed climbing 3-5 steps with a railing? : A Little 6 Click Score: 18    End of Session Equipment Utilized During Treatment: Gait belt Activity Tolerance: Patient tolerated treatment well Patient left: in  chair;with call bell/phone within reach Nurse Communication: Mobility status PT Visit Diagnosis: Difficulty in walking, not elsewhere classified (R26.2)     Time: [id number] PT Time Calculation (min) (ACUTE ONLY): 14 min  Charges:  $Gait Training: 8-22 mins $Therapeutic Exercise: 8-22 mins                    G Codes:       Pg [phone]    Levelle Edelen 05/23/2016, 11:19 AM

## 2016-05-23 NOTE — Progress Notes (Signed)
   Subjective:  Patient reports pain as mild to moderate.  Radiologist who read his postop films called me postop stating he was looking at preop chest xray and had concern for possible RUL nodule and recommended CT - which was (-). Denies N/V/CP/SOB.  Objective:   VITALS:   Vitals:   05/22/16 1841 05/22/16 2011 05/23/16 0042 05/23/16 0552  BP: 131/84 121/84 (!) 151/85 133/89  Pulse: 69 91 99 78  Resp: 16 16 16 18   Temp: 97.6 F (36.4 C) 98.1 F (36.7 C) 98.1 F (36.7 C) 98 F (36.7 C)  TempSrc: Oral Oral Oral Oral  SpO2: 99% 98% 99% 99%  Weight:      Height:        NAD ABD soft Sensation intact distally Intact pulses distally Dorsiflexion/Plantar flexion intact Incision: dressing C/D/I Compartment soft   Lab Results  Component Value Date   WBC 14.7 (H) 05/23/2016   HGB 12.5 (L) 05/23/2016   HCT 37.7 (L) 05/23/2016   MCV 95.4 05/23/2016   PLT 224 05/23/2016   BMET    Component Value Date/Time   NA 137 05/23/2016 0423   NA 144 03/21/2016 0951   K 3.4 (L) 05/23/2016 0423   CL 105 05/23/2016 0423   CO2 24 05/23/2016 0423   GLUCOSE 159 (H) 05/23/2016 0423   BUN 15 05/23/2016 0423   BUN 21 03/21/2016 0951   CREATININE 0.65 05/23/2016 0423   CREATININE 1.01 04/19/2012 0926   CALCIUM 8.4 (L) 05/23/2016 0423   GFRNONAA >60 05/23/2016 0423   GFRAA >60 05/23/2016 0423     Assessment/Plan: 1 Day Post-Op   Principal Problem:   Osteoarthritis of left hip   WBAT with walker DVT ppx: pt elects to resume warfarin due to cost, give 5mg  tonight and have PCP check INR on Monday PO pain control PT/OT D/c home today   Jacob Sanders, Jacob Sanders 05/23/2016, 8:48 AM   Jacob FredericBrian Asya Derryberry, MD Cell [phone]

## 2016-05-23 NOTE — Evaluation (Signed)
Occupational Therapy Evaluation Patient Details Name: Jacob Sanders MRN: [id number] DOB: 1945-01-22 Today's Date: 05/23/2016    History of Present Illness s/p L DA THA   Clinical Impression   This 71 year old man was admitted for the above sx.  All education was completed. No further OT is needed at this time    Follow Up Recommendations  No OT follow up;Supervision/Assistance - 24 hour    Equipment Recommendations  None recommended by OT    Recommendations for Other Services       Precautions / Restrictions Precautions Precautions: Fall Restrictions Weight Bearing Restrictions: No      Mobility Bed Mobility                  Transfers   Equipment used: Rolling walker (2 wheeled)             General transfer comment: min guard for safety for sit to stand; cues for UE/LE placement    Balance                                           ADL either performed or assessed with clinical judgement   ADL Overall ADL's : Needs assistance/impaired Eating/Feeding: Independent   Grooming: Set up;Sitting   Upper Body Bathing: Sitting;Set up   Lower Body Bathing: Minimal assistance;Sit to/from stand   Upper Body Dressing : Set up;Sitting   Lower Body Dressing: Moderate assistance;Sit to/from stand   Toilet Transfer: Min Public house managerguard;BSC;Ambulation;RW   Toileting- ArchitectClothing Manipulation and Hygiene: Min guard;Sit to/from stand   Tub/ Shower Transfer: Walk-in shower;Min guard;Ambulation     General ADL Comments: wife will assist pt as needed. Educated on safety, sidestepping through tight spaces, keeping walker in front of him when standing.  Pt feels he will have to stand in his shower as space is tight. Explained he might be able to put 3:1 behind him and sit down immediately after stepping in.  If standing, recommended that he sit on toilet to wash lower legs     Vision         Perception     Praxis      Pertinent Vitals/Pain Pain  Assessment: 0-10 Pain Score: 2  Pain Location: L hip  Pain Descriptors / Indicators: Sore Pain Intervention(s): Limited activity within patient's tolerance;Monitored during session;Premedicated before session;Repositioned     Hand Dominance     Extremity/Trunk Assessment             Communication Communication Communication: No difficulties   Cognition Arousal/Alertness: Awake/alert Behavior During Therapy: WFL for tasks assessed/performed Overall Cognitive Status: Within Functional Limits for tasks assessed                                     General Comments       Exercises     Shoulder Instructions      Home Living Family/patient expects to be discharged to:: Private residence Living Arrangements: Spouse/significant other Available Help at Discharge: Family               Bathroom Shower/Tub: Walk-in Human resources officershower   Bathroom Toilet: Standard     Home Equipment: Bedside commode          Prior Functioning/Environment Level of Independence: Independent  OT Problem List:        OT Treatment/Interventions:      OT Goals(Current goals can be found in the care plan section) Acute Rehab OT Goals Patient Stated Goal: home and hopefully not need R hip done OT Goal Formulation: All assessment and education complete, DC therapy  OT Frequency:     Barriers to D/C:            Co-evaluation              AM-PAC PT "6 Clicks" Daily Activity     Outcome Measure Help from another person eating meals?: None Help from another person taking care of personal grooming?: A Little Help from another person toileting, which includes using toliet, bedpan, or urinal?: A Little Help from another person bathing (including washing, rinsing, drying)?: A Little Help from another person to put on and taking off regular upper body clothing?: A Little Help from another person to put on and taking off regular lower body clothing?: A  Little 6 Click Score: 19   End of Session    Activity Tolerance: Patient tolerated treatment well Patient left: in chair;with call bell/phone within reach;with restraints reapplied  OT Visit Diagnosis: Pain Pain - Right/Left: Left Pain - part of body: Hip                Time: 1610-9604 OT Time Calculation (min): 23 min Charges:  OT General Charges $OT Visit: 1 Procedure OT Evaluation $OT Eval Low Complexity: 1 Procedure G-Codes:     [place], OTR/L [phone] 05/23/2016  Jacob Sanders 05/23/2016, 10:34 AM

## 2016-05-23 NOTE — Discharge Summary (Signed)
Physician Discharge Summary  Patient ID: Jacob Sanders MRN: [id number] DOB/AGE: August 20, 1945 71 y.o.  Admit date: 05/22/2016 Discharge date: 05/23/2016  Admission Diagnoses:  Osteoarthritis of left hip  Discharge Diagnoses:  Principal Problem:   Osteoarthritis of left hip   Past Medical History:  Diagnosis Date  . Arthritis   . GERD (gastroesophageal reflux disease)   . Hyperlipidemia   . Hypertension     Surgeries: Procedure(s): LEFT TOTAL HIP ARTHROPLASTY ANTERIOR APPROACH on 05/22/2016   Consultants (if any):   Discharged Condition: Improved  Hospital Course: Jacob Sanders is an 71 y.o. male who was admitted 05/22/2016 with a diagnosis of Osteoarthritis of left hip and went to the operating room on 05/22/2016 and underwent the above named procedures.    He was given perioperative antibiotics:  Anti-infectives    Start     Dose/Rate Route Frequency Ordered Stop   05/22/16 1900  ceFAZolin (ANCEF) IVPB 2g/100 mL premix     2 g 200 mL/hr over 30 Minutes Intravenous Every 6 hours 05/22/16 1659 05/23/16 0130   05/22/16 1037  ceFAZolin (ANCEF) IVPB 2g/100 mL premix     2 g 200 mL/hr over 30 Minutes Intravenous On call to O.R. 05/22/16 1038 05/22/16 1254    .  He was given sequential compression devices, early ambulation, and coumadin for DVT prophylaxis.  He benefited maximally from the hospital stay and there were no complications.    Recent vital signs:  Vitals:   05/23/16 0042 05/23/16 0552  BP: (!) 151/85 133/89  Pulse: 99 78  Resp: 16 18  Temp: 98.1 F (36.7 C) 98 F (36.7 C)    Recent laboratory studies:  Lab Results  Component Value Date   HGB 12.5 (L) 05/23/2016   HGB 16.1 05/15/2016   HGB 15.8 03/10/2014   Lab Results  Component Value Date   WBC 14.7 (H) 05/23/2016   PLT 224 05/23/2016   Lab Results  Component Value Date   INR 1.03 05/22/2016   Lab Results  Component Value Date   NA 137 05/23/2016   K 3.4 (L) 05/23/2016   CL 105  05/23/2016   CO2 24 05/23/2016   BUN 15 05/23/2016   CREATININE 0.65 05/23/2016   GLUCOSE 159 (H) 05/23/2016    Discharge Medications:   Allergies as of 05/23/2016      Reactions   Prednisone Swelling      Medication List    STOP taking these medications   traMADol 50 MG tablet Commonly known as:  ULTRAM   TYLENOL 8 HOUR ARTHRITIS PAIN 650 MG CR tablet Generic drug:  acetaminophen     TAKE these medications   BIOFREEZE EX Apply 1 application topically 3 (three) times daily as needed (for pain).   diltiazem 240 MG 24 hr tablet Commonly known as:  CARDIZEM LA Take 1 tablet (240 mg total) by mouth daily.   docusate sodium 100 MG capsule Commonly known as:  COLACE Take 1 capsule (100 mg total) by mouth 2 (two) times daily.   HYDROcodone-acetaminophen 5-325 MG tablet Commonly known as:  NORCO/VICODIN Take 1-2 tablets by mouth every 4 (four) hours as needed (breakthrough pain).   omeprazole 20 MG capsule Commonly known as:  PRILOSEC TAKE 1 CAPSULE BY MOUTH  DAILY AS NEEDED What changed:  See the new instructions.   ondansetron 4 MG tablet Commonly known as:  ZOFRAN Take 1 tablet (4 mg total) by mouth every 6 (six) hours as needed for nausea.   senna  8.6 MG Tabs tablet Commonly known as:  SENOKOT Take 2 tablets (17.2 mg total) by mouth at bedtime.   warfarin 5 MG tablet Commonly known as:  COUMADIN Take 1 tablet (5 mg total) by mouth daily at 6 PM. What changed:  medication strength  how much to take  when to take this       Diagnostic Studies: Ct Chest Wo Contrast  Result Date: 05/22/2016 CLINICAL DATA:  Postoperative check and to evaluate suspicious nodule seen on chest radiograph. EXAM: CT CHEST WITHOUT CONTRAST TECHNIQUE: Multidetector CT imaging of the chest was performed following the standard protocol without IV contrast. COMPARISON:  Chest 03/21/2016 FINDINGS: Cardiovascular: Normal heart size. No pericardial effusion. Coronary artery  calcifications. Mild aortic calcification. No aneurysm. Mediastinum/Nodes: Thyroid gland is unremarkable. Small esophageal hiatal hernia. Esophagus is decompressed. No significant lymphadenopathy in the chest. Lungs/Pleura: Linear atelectasis or scarring in the lung bases. No focal airspace disease or consolidation. No significant parenchymal nodules or mass lesions identified. Tiny calcified granulomas on the right. No pleural effusions. No pneumothorax. Airways are patent. Upper Abdomen: Fatty infiltration of the pancreas. Vascular calcifications. Visualized upper abdominal organs otherwise unremarkable on noncontrast imaging. Musculoskeletal: Degenerative changes in the spine. Normal alignment. No vertebral compression. No depressed rib or sternal fractures. No focal bone lesions.Degenerative changes in the shoulders. Soft tissue calcification involving the left shoulder of could represent loose body or synovial osteochondromatosis. Bone lucency in the angle of the mandible may represent periodontal disease. IMPRESSION: No acute process demonstrated in the chest. Coronary artery calcifications. Small esophageal hiatal hernia. Linear scarring or atelectasis in the lung bases. Electronically Signed   By: Burman NievesWilliam  Stevens M.D.   On: 05/22/2016 22:03   Dg Pelvis Portable  Result Date: 05/22/2016 CLINICAL DATA:  Status post left hip replacement EXAM: PORTABLE PELVIS 1-2 VIEWS COMPARISON:  03/10/2014 FINDINGS: Left hip replacement is now seen in satisfactory position. Severe degenerative changes of the right hip joint are noted. No acute bony or soft tissue abnormality is seen. IMPRESSION: Status post left hip replacement. Electronically Signed   By: Alcide CleverMark  Lukens M.D.   On: 05/22/2016 15:35   Dg C-arm 61-120 Min-no Report  Result Date: 05/22/2016 Fluoroscopy was utilized by the requesting physician.  No radiographic interpretation.   Dg Hip Operative Unilat With Pelvis Left  Result Date: 05/22/2016 CLINICAL  DATA:  71 year old male with a history of left hip arthroplasty EXAM: OPERATIVE LEFT HIP (WITH PELVIS IF PERFORMED) 2 VIEWS TECHNIQUE: Fluoroscopic spot image(s) were submitted for interpretation post-operatively. COMPARISON:  None. FINDINGS: Intraoperative fluoroscopic spot images of the left hip demonstrating left hip arthroplasty. No immediate complicating features. IMPRESSION: Intraoperative fluoroscopic spot images of left hip arthroplasty with no immediate complicating features. Please refer to the dictated operative report for full details of intraoperative findings and procedure. Electronically Signed   By: Gilmer MorJaime  Wagner D.O.   On: 05/22/2016 14:53    Disposition: Final discharge disposition not confirmed  Discharge Instructions    Call MD / Call 911    Complete by:  As directed    If you experience chest pain or shortness of breath, CALL 911 and be transported to the hospital emergency room.  If you develope a fever above 101 F, pus (white drainage) or increased drainage or redness at the wound, or calf pain, call your surgeon's office.   Constipation Prevention    Complete by:  As directed    Drink plenty of fluids.  Prune juice may be helpful.  You may  use a stool softener, such as Colace (over the counter) 100 mg twice a day.  Use MiraLax (over the counter) for constipation as needed.   Diet - low sodium heart healthy    Complete by:  As directed    Discharge instructions    Complete by:  As directed    Resume coumadin 5mg  every evening. Please arrange for INR check with your primary care doctor this Monday, and have results faxed to [facility] [phone]   Driving restrictions    Complete by:  As directed    No driving for 6 weeks   Increase activity slowly as tolerated    Complete by:  As directed    Lifting restrictions    Complete by:  As directed    No lifting for 6 weeks   TED hose    Complete by:  As directed    Use stockings (TED hose) for 2 weeks on both  leg(s).  You may remove them at night for sleeping.      Follow-up Information    Lashaunta Sicard, Arlys John, MD. Schedule an appointment as soon as possible for a visit in 2 week(s).   Specialty:  Orthopedic Surgery Why:  For wound re-check Contact information: [address] [place] [address] [phone]        Junie Spencer, FNP. Schedule an appointment as soon as possible for a visit.   Specialty:  Family Medicine Why:  Please make appointment for INR check on Monday 5/14. Fax results to [phone] Contact information: [address] [place] [address] [phone]            Signed: Garnet Koyanagi 05/23/2016, 9:02 AM

## 2016-05-23 NOTE — Evaluation (Signed)
Physical Therapy Evaluation Patient Details Name: Jacob Sanders MRN: [id number] DOB: 1945/11/14 Today's Date: 05/23/2016   History of Present Illness  s/p L DA THA  Clinical Impression  Pt s/p L THR and presents with decreased L LE strength/ROM and post op pain limiting functional mobility.  Pt should progress to dc home with family assist.    Follow Up Recommendations Home health PT (NO PT follow up per physician)    Equipment Recommendations  None recommended by PT    Recommendations for Other Services OT consult     Precautions / Restrictions Precautions Precautions: Fall Restrictions Weight Bearing Restrictions: No Other Position/Activity Restrictions: WBAT      Mobility  Bed Mobility Overal bed mobility: Needs Assistance Bed Mobility: Supine to Sit     Supine to sit: Min assist     General bed mobility comments: cues for sequence and use of R LE to self assist  Transfers Overall transfer level: Needs assistance Equipment used: Rolling walker (2 wheeled) Transfers: Sit to/from Stand Sit to Stand: Min assist         General transfer comment: cues for LE management and use of UEs to self assist  Ambulation/Gait Ambulation/Gait assistance: Min assist;Min guard Ambulation Distance (Feet): 20 Feet (to bathroom) Assistive device: Rolling walker (2 wheeled) Gait Pattern/deviations: Step-to pattern;Step-through pattern;Shuffle;Trunk flexed Gait velocity: decr Gait velocity interpretation: Below normal speed for age/gender General Gait Details: cues for posture, position from RW and initial sequence  Stairs            Wheelchair Mobility    Modified Rankin (Stroke Patients Only)       Balance Overall balance assessment: No apparent balance deficits (not formally assessed)                                           Pertinent Vitals/Pain Pain Assessment: 0-10 Pain Score: 2  Pain Location: L hip  Pain Descriptors /  Indicators: Sore Pain Intervention(s): Limited activity within patient's tolerance;Monitored during session;Premedicated before session;Ice applied    Home Living Family/patient expects to be discharged to:: Private residence Living Arrangements: Spouse/significant other Available Help at Discharge: Family Type of Home: House Home Access: Stairs to enter Entrance Stairs-Rails: Right Entrance Stairs-Number of Steps: 1+2 Home Layout: One level Home Equipment: Bedside commode;Walker - 2 wheels      Prior Function Level of Independence: Independent with assistive device(s)         Comments: uses cane     Hand Dominance        Extremity/Trunk Assessment   Upper Extremity Assessment Upper Extremity Assessment: Overall WFL for tasks assessed    Lower Extremity Assessment Lower Extremity Assessment: LLE deficits/detail LLE Deficits / Details: Strength at hip 2+/5 with AAROM at hip to 80 flex and 20 abd       Communication   Communication: No difficulties  Cognition Arousal/Alertness: Awake/alert Behavior During Therapy: WFL for tasks assessed/performed Overall Cognitive Status: Within Functional Limits for tasks assessed                                        General Comments      Exercises Total Joint Exercises Ankle Circles/Pumps: AROM;Both;20 reps;Supine Quad Sets: AROM;Both;10 reps;Supine Heel Slides: AAROM;Left;20 reps;Supine Hip ABduction/ADduction: AAROM;Left;15 reps;Supine   Assessment/Plan  PT Assessment Patient needs continued PT services  PT Problem List Decreased strength;Decreased range of motion;Decreased activity tolerance;Decreased mobility;Decreased knowledge of use of DME;Pain       PT Treatment Interventions DME instruction;Gait training;Stair training;Functional mobility training;Therapeutic activities;Therapeutic exercise;Patient/family education    PT Goals (Current goals can be found in the Care Plan section)  Acute  Rehab PT Goals Patient Stated Goal: home and hopefully not need R hip done    Frequency 7X/week   Barriers to discharge        Co-evaluation               AM-PAC PT "6 Clicks" Daily Activity  Outcome Measure Difficulty turning over in bed (including adjusting bedclothes, sheets and blankets)?: A Little Difficulty moving from lying on back to sitting on the side of the bed? : A Little Difficulty sitting down on and standing up from a chair with arms (e.g., wheelchair, bedside commode, etc,.)?: A Little Help needed moving to and from a bed to chair (including a wheelchair)?: A Little Help needed walking in hospital room?: A Little Help needed climbing 3-5 steps with a railing? : A Little 6 Click Score: 18    End of Session Equipment Utilized During Treatment: Gait belt Activity Tolerance: Patient tolerated treatment well Patient left: Other (comment) (bathroom) Nurse Communication: Mobility status PT Visit Diagnosis: Difficulty in walking, not elsewhere classified (R26.2)    Time: 1610-9604 PT Time Calculation (min) (ACUTE ONLY): 30 min   Charges:   PT Evaluation $PT Eval Low Complexity: 1 Procedure PT Treatments $Therapeutic Exercise: 8-22 mins   PT G Codes:        Pg [phone]   Jordyn Hofacker 05/23/2016, 10:41 AM

## 2016-05-23 NOTE — Progress Notes (Signed)
Patient stated he does not want any medications today, he is going home and will take his medicine at home.

## 2016-05-29 ENCOUNTER — Encounter: Payer: Self-pay | Admitting: Family

## 2016-05-29 ENCOUNTER — Ambulatory Visit (INDEPENDENT_AMBULATORY_CARE_PROVIDER_SITE_OTHER): Payer: Medicare Other | Admitting: Family

## 2016-05-29 VITALS — BP 112/71 | HR 59 | Temp 97.4°F | Ht 70.0 in | Wt 182.2 lb

## 2016-05-29 DIAGNOSIS — R350 Frequency of micturition: Secondary | ICD-10-CM

## 2016-05-29 DIAGNOSIS — I4891 Unspecified atrial fibrillation: Secondary | ICD-10-CM | POA: Diagnosis not present

## 2016-05-29 DIAGNOSIS — M1612 Unilateral primary osteoarthritis, left hip: Secondary | ICD-10-CM

## 2016-05-29 DIAGNOSIS — Z96642 Presence of left artificial hip joint: Secondary | ICD-10-CM | POA: Diagnosis not present

## 2016-05-29 DIAGNOSIS — Z09 Encounter for follow-up examination after completed treatment for conditions other than malignant neoplasm: Secondary | ICD-10-CM | POA: Diagnosis not present

## 2016-05-29 LAB — MICROSCOPIC EXAMINATION
Bacteria, UA: NONE SEEN
Epithelial Cells (non renal): NONE SEEN /hpf (ref 0–10)
RENAL EPITHEL UA: NONE SEEN /HPF
WBC UA: NONE SEEN /HPF (ref 0–?)

## 2016-05-29 LAB — URINALYSIS, COMPLETE
Bilirubin, UA: NEGATIVE
Glucose, UA: NEGATIVE
Leukocytes, UA: NEGATIVE
Nitrite, UA: NEGATIVE
PH UA: 5 (ref 5.0–7.5)
Specific Gravity, UA: 1.025 (ref 1.005–1.030)
UUROB: 1 mg/dL (ref 0.2–1.0)

## 2016-05-29 LAB — COAGUCHEK XS/INR WAIVED
INR: 2.5 — ABNORMAL HIGH (ref 0.9–1.1)
Prothrombin Time: 29.8 s

## 2016-05-29 NOTE — Progress Notes (Signed)
Subjective:    Patient ID: Jacob Sanders, male    DOB: 1945-09-13, 71 y.o.   MRN: [id number]  PT presents to the office today for hospital follow up for left total hip replacement 05/22/16. PT states he is doing great! Pt states he has intermittent pain of 2 out 10.  Urinary Frequency   This is a new problem. The current episode started more than 1 month ago. The problem occurs intermittently. The problem has been gradually worsening. The quality of the pain is described as aching. Associated symptoms include frequency and urgency. Pertinent negatives include no discharge, flank pain, hematuria, nausea or vomiting. Associated symptoms comments: Urine incontinence . He has tried increased fluids for the symptoms. The treatment provided mild relief.  A Fib Pt currently taking warfarin. INR 2.5 at goal!   Review of Systems  Gastrointestinal: Negative for nausea and vomiting.  Genitourinary: Positive for frequency and urgency. Negative for flank pain and hematuria.  All other systems reviewed and are negative.      Objective:   Physical Exam  Constitutional: He is oriented to person, place, and time. He appears well-developed and well-nourished. No distress.  HENT:  Head: Normocephalic.  Right Ear: External ear normal.  Left Ear: External ear normal.  Mouth/Throat: Oropharynx is clear and moist.  Eyes: Pupils are equal, round, and reactive to light. Right eye exhibits no discharge. Left eye exhibits no discharge.  Neck: Normal range of motion. Neck supple. No thyromegaly present.  Cardiovascular: Normal heart sounds and intact distal pulses.  An irregularly irregular rhythm present. Tachycardia present.   No murmur heard. Pulmonary/Chest: Effort normal and breath sounds normal. No respiratory distress. He has no wheezes.  Abdominal: Soft. Bowel sounds are normal. He exhibits no distension. There is no tenderness.  Musculoskeletal: Normal range of motion. He exhibits no edema or  tenderness.  Neurological: He is alert and oriented to person, place, and time. He has normal reflexes. No cranial nerve deficit.  Skin: Skin is warm and dry. No rash noted. No erythema.  Psychiatric: He has a normal mood and affect. His behavior is normal. Judgment and thought content normal.  Vitals reviewed.     BP 112/71   Pulse (!) 59   Temp 97.4 F (36.3 C) (Oral)   Ht 5\' 10"  (1.778 m)   Wt 182 lb 3.2 oz (82.6 kg)   BMI 26.14 kg/m      Assessment & Plan:  1. Atrial fibrillation, unspecified type (HCC) INR as of 05/29/2016 and Previous Warfarin Dosing Information    INR Dt INR Goal Wkly Tot Sun Mon Tue Wed Thu Fri Sat     2.3-3.0 25 mg 5 mg 2.5 mg 5 mg 5 mg 5 mg 0 mg 2.5 mg   Alternate week 30 mg 5 mg 2.5 mg 5 mg 5 mg 5 mg 2.5 mg 5 mg    Previous description   No warfarin tomorrow - Friday, April 13th. Take 1/2 tablet Saturday, April 14th.  Then decrease warfarin 5mg  tablets to 1/2 tablet on Mondays and Fridays.  Take 1 tablet all other days.  INR was 2.7 today (goal INR is 2.0 to 3.0)   Anticoagulation Warfarin Dose Instructions as of 05/29/2016      Total Sun Mon Tue Wed Thu Fri Sat   New Dose 35 mg 5 mg 5 mg 5 mg 5 mg 5 mg 5 mg 5 mg     (5 mg x 1)  (5 mg x 1)  (  5 mg x 1)  (5 mg x 1)  (5 mg x 1)  (5 mg x 1)  (5 mg x 1)                         Description   Pt current taking 1 tablet (5 mg) daily, continue this dose  INR was 2.5 today (goal INR is 2.0 to 3.0)     - CoaguChek XS/INR Waived  2. Hospital discharge follow-up  3. Status post left hip replacement  4. Urinary frequency - Urinalysis, Complete  5. Primary osteoarthritis of left hip  Jannifer Rodney, FNP

## 2016-05-29 NOTE — Patient Instructions (Signed)
Atrial Fibrillation Atrial fibrillation is a type of irregular or rapid heartbeat (arrhythmia). In atrial fibrillation, the heart quivers continuously in a chaotic pattern. This occurs when parts of the heart receive disorganized signals that make the heart unable to pump blood normally. This can increase the risk for stroke, heart failure, and other heart-related conditions. There are different types of atrial fibrillation, including:  Paroxysmal atrial fibrillation. This type starts suddenly, and it usually stops on its own shortly after it starts.  Persistent atrial fibrillation. This type often lasts longer than a week. It may stop on its own or with treatment.  Long-lasting persistent atrial fibrillation. This type lasts longer than 12 months.  Permanent atrial fibrillation. This type does not go away.  Talk with your health care provider to learn about the type of atrial fibrillation that you have. What are the causes? This condition is caused by some heart-related conditions or procedures, including:  A heart attack.  Coronary artery disease.  Heart failure.  Heart valve conditions.  High blood pressure.  Inflammation of the sac that surrounds the heart (pericarditis).  Heart surgery.  Certain heart rhythm disorders, such as Wolf-Parkinson-White syndrome.  Other causes include:  Pneumonia.  Obstructive sleep apnea.  Blockage of an artery in the lungs (pulmonary embolism, or PE).  Lung cancer.  Chronic lung disease.  Thyroid problems, especially if the thyroid is overactive (hyperthyroidism).  Caffeine.  Excessive alcohol use or illegal drug use.  Use of some medicines, including certain decongestants and diet pills.  Sometimes, the cause cannot be found. What increases the risk? This condition is more likely to develop in:  People who are older in age.  People who smoke.  People who have diabetes mellitus.  People who are overweight  (obese).  Athletes who exercise vigorously.  What are the signs or symptoms? Symptoms of this condition include:  A feeling that your heart is beating rapidly or irregularly.  A feeling of discomfort or pain in your chest.  Shortness of breath.  Sudden light-headedness or weakness.  Getting tired easily during exercise.  In some cases, there are no symptoms. How is this diagnosed? Your health care provider may be able to detect atrial fibrillation when taking your pulse. If detected, this condition may be diagnosed with:  An electrocardiogram (ECG).  A Holter monitor test that records your heartbeat patterns over a 24-hour period.  Transthoracic echocardiogram (TTE) to evaluate how blood flows through your heart.  Transesophageal echocardiogram (TEE) to view more detailed images of your heart.  A stress test.  Imaging tests, such as a CT scan or chest X-ray.  Blood tests.  How is this treated? The main goals of treatment are to prevent blood clots from forming and to keep your heart beating at a normal rate and rhythm. The type of treatment that you receive depends on many factors, such as your underlying medical conditions and how you feel when you are experiencing atrial fibrillation. This condition may be treated with:  Medicine to slow down the heart rate, bring the heart's rhythm back to normal, or prevent clots from forming.  Electrical cardioversion. This is a procedure that resets your heart's rhythm by delivering a controlled, low-energy shock to the heart through your skin.  Different types of ablation, such as catheter ablation, catheter ablation with pacemaker, or surgical ablation. These procedures destroy the heart tissues that send abnormal signals. When the pacemaker is used, it is placed under your skin to help your heart beat in   a regular rhythm.  Follow these instructions at home:  Take over-the counter and prescription medicines only as told by your  health care provider.  If your health care provider prescribed a blood-thinning medicine (anticoagulant), take it exactly as told. Taking too much blood-thinning medicine can cause bleeding. If you do not take enough blood-thinning medicine, you will not have the protection that you need against stroke and other problems.  Do not use tobacco products, including cigarettes, chewing tobacco, and e-cigarettes. If you need help quitting, ask your health care provider.  If you have obstructive sleep apnea, manage your condition as told by your health care provider.  Do not drink alcohol.  Do not drink beverages that contain caffeine, such as coffee, soda, and tea.  Maintain a healthy weight. Do not use diet pills unless your health care provider approves. Diet pills may make heart problems worse.  Follow diet instructions as told by your health care provider.  Exercise regularly as told by your health care provider.  Keep all follow-up visits as told by your health care provider. This is important. How is this prevented?  Avoid drinking beverages that contain caffeine or alcohol.  Avoid certain medicines, especially medicines that are used for breathing problems.  Avoid certain herbs and herbal medicines, such as those that contain ephedra or ginseng.  Do not use illegal drugs, such as cocaine and amphetamines.  Do not smoke.  Manage your high blood pressure. Contact a health care provider if:  You notice a change in the rate, rhythm, or strength of your heartbeat.  You are taking an anticoagulant and you notice increased bruising.  You tire more easily when you exercise or exert yourself. Get help right away if:  You have chest pain, abdominal pain, sweating, or weakness.  You feel nauseous.  You notice blood in your vomit, bowel movement, or urine.  You have shortness of breath.  You suddenly have swollen feet and ankles.  You feel dizzy.  You have sudden weakness or  numbness of the face, arm, or leg, especially on one side of the body.  You have trouble speaking, trouble understanding, or both (aphasia).  Your face or your eyelid droops on one side. These symptoms may represent a serious problem that is an emergency. Do not wait to see if the symptoms will go away. Get medical help right away. Call your local emergency services (911 in the [place] Do not drive yourself to the hospital. This information is not intended to replace advice given to you by your health care provider. Make sure you discuss any questions you have with your health care provider. Document Released: 12/30/2004 Document Revised: 05/09/2015 Document Reviewed: 04/26/2014 Elsevier Interactive Patient Education  2017 [employer]  

## 2016-05-30 ENCOUNTER — Telehealth: Payer: Self-pay | Admitting: Family

## 2016-05-30 ENCOUNTER — Other Ambulatory Visit: Payer: Self-pay | Admitting: Family

## 2016-05-30 DIAGNOSIS — R300 Dysuria: Secondary | ICD-10-CM

## 2016-06-02 NOTE — Telephone Encounter (Signed)
See Result note 

## 2016-06-05 DIAGNOSIS — Z471 Aftercare following joint replacement surgery: Secondary | ICD-10-CM | POA: Diagnosis not present

## 2016-06-05 DIAGNOSIS — Z96642 Presence of left artificial hip joint: Secondary | ICD-10-CM | POA: Diagnosis not present

## 2016-06-30 ENCOUNTER — Encounter: Payer: Self-pay | Admitting: Family

## 2016-06-30 ENCOUNTER — Ambulatory Visit (INDEPENDENT_AMBULATORY_CARE_PROVIDER_SITE_OTHER): Payer: Medicare Other | Admitting: Family

## 2016-06-30 VITALS — BP 109/67 | HR 80 | Temp 97.1°F

## 2016-06-30 DIAGNOSIS — I4891 Unspecified atrial fibrillation: Secondary | ICD-10-CM | POA: Diagnosis not present

## 2016-06-30 DIAGNOSIS — R791 Abnormal coagulation profile: Secondary | ICD-10-CM

## 2016-06-30 LAB — COAGUCHEK XS/INR WAIVED
INR: 7.8 (ref 0.9–1.1)
PROTHROMBIN TIME: 93.9 s

## 2016-06-30 LAB — HEMOGLOBIN, FINGERSTICK: HEMOGLOBIN: 14.7 g/dL (ref 12.6–17.7)

## 2016-06-30 MED ORDER — PHYTONADIONE 5 MG PO TABS
2.5000 mg | ORAL_TABLET | Freq: Once | ORAL | Status: AC
  Administered 2016-06-30: 2.5 mg via ORAL

## 2016-06-30 NOTE — Progress Notes (Signed)
   Subjective:    Patient ID: Jacob Sanders, male    DOB: 1945/11/24, 71 y.o.   MRN: [id number]  HPI Pt presents to the office today for Protime for A Fib. Pt currently taking warfarin. Pt does not want to continue with the Warfarin. PT states he does not want to be on anything that causes his "blood to be thin". Pt states he believes "God and regulate my blood". He states his blood is too thin and "I can die from that".    Review of Systems  Skin: Positive for color change.  All other systems reviewed and are negative.      Objective:   Physical Exam  Constitutional: He is oriented to person, place, and time. He appears well-developed and well-nourished. No distress.  HENT:  Head: Normocephalic.  Cardiovascular: Normal rate, regular rhythm, normal heart sounds and intact distal pulses.   No murmur heard. Pulmonary/Chest: Effort normal and breath sounds normal. No respiratory distress. He has no wheezes.  Abdominal: Soft. Bowel sounds are normal. He exhibits no distension. There is no tenderness.  Musculoskeletal: Normal range of motion. He exhibits no edema or tenderness.  Neurological: He is alert and oriented to person, place, and time.  Skin: Skin is warm and dry. No rash noted. No erythema.  Psychiatric: He has a normal mood and affect. His behavior is normal. Judgment and thought content normal.  Vitals reviewed.     BP (!) 144/79   Pulse 89   Temp 97.1 F (36.2 C) (Oral)      Assessment & Plan:  1. Atrial fibrillation, unspecified type (HCC) - CoaguChek XS/INR Waived - Hemoglobin, fingerstick - Protime-INR  2. Elevated INR - phytonadione (VITAMIN K) tablet 2.5 mg; Take 0.5 tablets (2.5 mg total) by mouth once.   Hold warfarin Discussed in length of risk for CVA  Smoking cessation discussed Return office 06/22 to recheck INR PT start aspirin 325 mg after INR is at goal!  Jannifer Rodneyhristy Braydyn Schultes, FNP

## 2016-06-30 NOTE — Patient Instructions (Signed)
Atrial Fibrillation Atrial fibrillation is a type of irregular or rapid heartbeat (arrhythmia). In atrial fibrillation, the heart quivers continuously in a chaotic pattern. This occurs when parts of the heart receive disorganized signals that make the heart unable to pump blood normally. This can increase the risk for stroke, heart failure, and other heart-related conditions. There are different types of atrial fibrillation, including:  Paroxysmal atrial fibrillation. This type starts suddenly, and it usually stops on its own shortly after it starts.  Persistent atrial fibrillation. This type often lasts longer than a week. It may stop on its own or with treatment.  Long-lasting persistent atrial fibrillation. This type lasts longer than 12 months.  Permanent atrial fibrillation. This type does not go away.  Talk with your health care provider to learn about the type of atrial fibrillation that you have. What are the causes? This condition is caused by some heart-related conditions or procedures, including:  A heart attack.  Coronary artery disease.  Heart failure.  Heart valve conditions.  High blood pressure.  Inflammation of the sac that surrounds the heart (pericarditis).  Heart surgery.  Certain heart rhythm disorders, such as Wolf-Parkinson-White syndrome.  Other causes include:  Pneumonia.  Obstructive sleep apnea.  Blockage of an artery in the lungs (pulmonary embolism, or PE).  Lung cancer.  Chronic lung disease.  Thyroid problems, especially if the thyroid is overactive (hyperthyroidism).  Caffeine.  Excessive alcohol use or illegal drug use.  Use of some medicines, including certain decongestants and diet pills.  Sometimes, the cause cannot be found. What increases the risk? This condition is more likely to develop in:  People who are older in age.  People who smoke.  People who have diabetes mellitus.  People who are overweight  (obese).  Athletes who exercise vigorously.  What are the signs or symptoms? Symptoms of this condition include:  A feeling that your heart is beating rapidly or irregularly.  A feeling of discomfort or pain in your chest.  Shortness of breath.  Sudden light-headedness or weakness.  Getting tired easily during exercise.  In some cases, there are no symptoms. How is this diagnosed? Your health care provider may be able to detect atrial fibrillation when taking your pulse. If detected, this condition may be diagnosed with:  An electrocardiogram (ECG).  A Holter monitor test that records your heartbeat patterns over a 24-hour period.  Transthoracic echocardiogram (TTE) to evaluate how blood flows through your heart.  Transesophageal echocardiogram (TEE) to view more detailed images of your heart.  A stress test.  Imaging tests, such as a CT scan or chest X-ray.  Blood tests.  How is this treated? The main goals of treatment are to prevent blood clots from forming and to keep your heart beating at a normal rate and rhythm. The type of treatment that you receive depends on many factors, such as your underlying medical conditions and how you feel when you are experiencing atrial fibrillation. This condition may be treated with:  Medicine to slow down the heart rate, bring the heart's rhythm back to normal, or prevent clots from forming.  Electrical cardioversion. This is a procedure that resets your heart's rhythm by delivering a controlled, low-energy shock to the heart through your skin.  Different types of ablation, such as catheter ablation, catheter ablation with pacemaker, or surgical ablation. These procedures destroy the heart tissues that send abnormal signals. When the pacemaker is used, it is placed under your skin to help your heart beat in   a regular rhythm.  Follow these instructions at home:  Take over-the counter and prescription medicines only as told by your  health care provider.  If your health care provider prescribed a blood-thinning medicine (anticoagulant), take it exactly as told. Taking too much blood-thinning medicine can cause bleeding. If you do not take enough blood-thinning medicine, you will not have the protection that you need against stroke and other problems.  Do not use tobacco products, including cigarettes, chewing tobacco, and e-cigarettes. If you need help quitting, ask your health care provider.  If you have obstructive sleep apnea, manage your condition as told by your health care provider.  Do not drink alcohol.  Do not drink beverages that contain caffeine, such as coffee, soda, and tea.  Maintain a healthy weight. Do not use diet pills unless your health care provider approves. Diet pills may make heart problems worse.  Follow diet instructions as told by your health care provider.  Exercise regularly as told by your health care provider.  Keep all follow-up visits as told by your health care provider. This is important. How is this prevented?  Avoid drinking beverages that contain caffeine or alcohol.  Avoid certain medicines, especially medicines that are used for breathing problems.  Avoid certain herbs and herbal medicines, such as those that contain ephedra or ginseng.  Do not use illegal drugs, such as cocaine and amphetamines.  Do not smoke.  Manage your high blood pressure. Contact a health care provider if:  You notice a change in the rate, rhythm, or strength of your heartbeat.  You are taking an anticoagulant and you notice increased bruising.  You tire more easily when you exercise or exert yourself. Get help right away if:  You have chest pain, abdominal pain, sweating, or weakness.  You feel nauseous.  You notice blood in your vomit, bowel movement, or urine.  You have shortness of breath.  You suddenly have swollen feet and ankles.  You feel dizzy.  You have sudden weakness or  numbness of the face, arm, or leg, especially on one side of the body.  You have trouble speaking, trouble understanding, or both (aphasia).  Your face or your eyelid droops on one side. These symptoms may represent a serious problem that is an emergency. Do not wait to see if the symptoms will go away. Get medical help right away. Call your local emergency services (911 in the [place] Do not drive yourself to the hospital. This information is not intended to replace advice given to you by your health care provider. Make sure you discuss any questions you have with your health care provider. Document Released: 12/30/2004 Document Revised: 05/09/2015 Document Reviewed: 04/26/2014 Elsevier Interactive Patient Education  2017 [employer]  

## 2016-07-01 ENCOUNTER — Telehealth: Payer: Self-pay | Admitting: Family

## 2016-07-01 LAB — PROTIME-INR
INR: 5.3 — ABNORMAL HIGH (ref 0.8–1.2)
Prothrombin Time: 49.9 s — ABNORMAL HIGH (ref 9.1–12.0)

## 2016-07-01 NOTE — Telephone Encounter (Signed)
Fyi, patient wanted to make Jacob Sanders aware that the Coumadin dosage listed on his AVS was incorrect.  They are aware of what dosage was recommended at his visit yesterday.

## 2016-07-01 NOTE — Telephone Encounter (Signed)
I forgot to update flowsheet, but had long discussion with pt about him wanting to stop warfarin. Continue to hold warfarin and keep follow up appt at end of week

## 2016-07-03 DIAGNOSIS — Z471 Aftercare following joint replacement surgery: Secondary | ICD-10-CM | POA: Diagnosis not present

## 2016-07-03 DIAGNOSIS — Z96642 Presence of left artificial hip joint: Secondary | ICD-10-CM | POA: Diagnosis not present

## 2016-07-04 ENCOUNTER — Telehealth: Payer: Self-pay | Admitting: *Deleted

## 2016-07-04 ENCOUNTER — Ambulatory Visit (INDEPENDENT_AMBULATORY_CARE_PROVIDER_SITE_OTHER): Payer: Medicare Other | Admitting: Pharmacist

## 2016-07-04 DIAGNOSIS — I4891 Unspecified atrial fibrillation: Secondary | ICD-10-CM

## 2016-07-04 LAB — COAGUCHEK XS/INR WAIVED
INR: 1.3 — ABNORMAL HIGH (ref 0.9–1.1)
Prothrombin Time: 15 s

## 2016-07-04 NOTE — Telephone Encounter (Signed)
I cleared him during his last visit. Low risk. Hold Warfarin 5 days before and resume after.

## 2016-07-04 NOTE — Telephone Encounter (Signed)
Will fax this note and the last office note to the number provided.

## 2016-07-04 NOTE — Patient Instructions (Signed)
Anticoagulation Warfarin Dose Instructions as of 07/04/2016      Glynis SmilesSun Mon Tue Wed Thu Fri Sat   New Dose 2.5 mg 2.5 mg 2.5 mg 2.5 mg 2.5 mg 2.5 mg 2.5 mg    Description   Take 1/2 tablet by mouth daily until next visit on 6/29. We will try to get your INR in range gradually.  INR was 1.3 today (goal INR is 2.0 to 3.0) your blood is on the thick side today

## 2016-07-04 NOTE — Telephone Encounter (Signed)
Patient walked into the office with a surgical clearance for right THA. Will forward to dr Kirke Corinarida for review and advise.

## 2016-07-07 ENCOUNTER — Other Ambulatory Visit: Payer: Self-pay | Admitting: Family

## 2016-07-08 ENCOUNTER — Ambulatory Visit: Payer: Self-pay | Admitting: Orthopedic Surgery

## 2016-07-09 ENCOUNTER — Other Ambulatory Visit (HOSPITAL_COMMUNITY): Payer: Medicare Other

## 2016-07-09 ENCOUNTER — Ambulatory Visit: Payer: Self-pay | Admitting: Orthopedic Surgery

## 2016-07-09 NOTE — H&P (Signed)
TOTAL HIP ADMISSION H&P  Patient is admitted for right total hip arthroplasty.  Subjective:  Chief Complaint: right hip pain  HPI: Jacob Sanders, 71 y.o. male, has a history of pain and functional disability in the right hip(s) due to arthritis and patient has failed non-surgical conservative treatments for greater than 12 weeks to include NSAID's and/or analgesics, flexibility and strengthening excercises, supervised PT with diminished ADL's post treatment, use of assistive devices, weight reduction as appropriate and activity modification.  Onset of symptoms was gradual starting 5 years ago with rapidlly worsening course since that time.The patient noted no past surgery on the right hip(s).  Patient currently rates pain in the right hip at 10 out of 10 with activity. Patient has night pain, worsening of pain with activity and weight bearing, pain that interfers with activities of daily living, pain with passive range of motion and joint swelling. Patient has evidence of subchondral cysts, subchondral sclerosis, periarticular osteophytes and joint space narrowing by imaging studies. This condition presents safety issues increasing the risk of falls.  There is no current active infection.  Patient Active Problem List   Diagnosis Date Noted  . Osteoarthritis of left hip 05/22/2016  . Osteoarthritis, hip, bilateral 03/27/2016  . Atrial fibrillation (HCC) 03/21/2016  . Overweight (BMI 25.0-29.9) 03/21/2016  . Pain in both lower extremities 02/27/2016  . Tachycardia 02/27/2016  . History of atrial fibrillation without current medication 02/27/2016  . Primary osteoarthritis of both knees 11/06/2015  . Current smoker 12/14/2014  . Vitamin D deficiency 05/17/2014  . GERD (gastroesophageal reflux disease) 05/15/2014  . Fatigue 03/10/2014  . Arthritis 03/10/2014  . Essential hypertension, benign 04/20/2012  . Gout, unspecified 04/20/2012  . Hyperlipemia 04/20/2012   Past Medical History:   Diagnosis Date  . Arthritis   . GERD (gastroesophageal reflux disease)   . Hyperlipidemia   . Hypertension     Past Surgical History:  Procedure Laterality Date  . SPINE SURGERY     Resulting staph infection  . TONSILLECTOMY     childhood   . TOOTH EXTRACTION  3 weeks ago ;   had abcess beneath tooth; experiened some bleeding for 20 hours post op  . TOTAL HIP ARTHROPLASTY Left 05/22/2016   Procedure: LEFT TOTAL HIP ARTHROPLASTY ANTERIOR APPROACH;  Surgeon: Samson Frederic, MD;  Location: [facility];  Service: Orthopedics;  Laterality: Left;  Dr. requesting RNFA     (Not in a hospital admission) Allergies  Allergen Reactions  . Prednisone Swelling    Social History  Substance Use Topics  . Smoking status: Current Every Day Smoker    Packs/day: 0.50    Years: 30.00    Types: Cigars  . Smokeless tobacco: Never Used  . Alcohol use No    Family History  Problem Relation Age of Onset  . Aneurysm Father      Review of Systems  Constitutional: Negative.   HENT: Negative.   Eyes: Negative.   Respiratory: Negative.   Cardiovascular: Negative.   Gastrointestinal: Negative.   Genitourinary: Negative.   Musculoskeletal: Positive for joint pain.  Skin: Negative.   Neurological: Negative.   Endo/Heme/Allergies: Negative.   Psychiatric/Behavioral: Negative.     Objective:  Physical Exam  Vitals reviewed. Constitutional: He is oriented to person, place, and time. He appears well-developed and well-nourished.  HENT:  Head: Normocephalic and atraumatic.  Eyes: Conjunctivae and EOM are normal. Pupils are equal, round, and reactive to light.  Neck: Normal range of motion. Neck supple.  Cardiovascular: Normal  rate and intact distal pulses.   Respiratory: Effort normal. No respiratory distress.  GI: Soft. He exhibits no distension.  Genitourinary:  Genitourinary Comments: deferred  Musculoskeletal:       Right hip: He exhibits decreased range of motion.  Neurological: He is  alert and oriented to person, place, and time. He has normal reflexes.  Skin: Skin is warm and dry.  Psychiatric: He has a normal mood and affect. His behavior is normal. Judgment and thought content normal.    Vital signs in last 24 hours: @VSRANGES @  Labs:   Estimated body mass index is 26.14 kg/m as calculated from the following:   Height as of 05/29/16: 5\' 10"  (1.778 m).   Weight as of 05/29/16: 82.6 kg (182 lb 3.2 oz).   Imaging Review Plain radiographs demonstrate severe degenerative joint disease of the right hip(s). The bone quality appears to be adequate for age and reported activity level.  Assessment/Plan:  End stage arthritis, right hip(s)  The patient history, physical examination, clinical judgement of the provider and imaging studies are consistent with end stage degenerative joint disease of the right hip(s) and total hip arthroplasty is deemed medically necessary. The treatment options including medical management, injection therapy, arthroscopy and arthroplasty were discussed at length. The risks and benefits of total hip arthroplasty were presented and reviewed. The risks due to aseptic loosening, infection, stiffness, dislocation/subluxation,  thromboembolic complications and other imponderables were discussed.  The patient acknowledged the explanation, agreed to proceed with the plan and consent was signed. Patient is being admitted for inpatient treatment for surgery, pain control, PT, OT, prophylactic antibiotics, VTE prophylaxis, progressive ambulation and ADL's and discharge planning.The patient is planning to be discharged home with HEP.

## 2016-07-10 ENCOUNTER — Ambulatory Visit: Payer: Medicare Other | Admitting: Family Medicine

## 2016-07-11 ENCOUNTER — Other Ambulatory Visit (HOSPITAL_COMMUNITY): Payer: Self-pay | Admitting: Emergency Medicine

## 2016-07-11 ENCOUNTER — Ambulatory Visit (INDEPENDENT_AMBULATORY_CARE_PROVIDER_SITE_OTHER): Payer: Medicare Other | Admitting: Pharmacist Clinician (PhC)/ Clinical Pharmacy Specialist

## 2016-07-11 DIAGNOSIS — I4891 Unspecified atrial fibrillation: Secondary | ICD-10-CM | POA: Diagnosis not present

## 2016-07-11 LAB — COAGUCHEK XS/INR WAIVED
INR: 1.4 — AB (ref 0.9–1.1)
PROTHROMBIN TIME: 16.7 s

## 2016-07-11 NOTE — Progress Notes (Addendum)
LOV cardiology clear Jacob Sanders 05-06-16 chart EKG 05-06-16 chart, epic ECHO 01-24-16 epic MD states "Aortic valve:   Trileaflet; normal thickness, mildly calcified leaflets. Mobility was not restricted.  Doppler:  Transvalvular velocity was within the normal range. There was no stenosis. There was no regurgitation."  Stress test 01-17-16 epic PTINR 07-11-16 epic CT Chest 05-22-16 epic Jacob ButteryLOV Hawks FNP 06-30-16 epic

## 2016-07-11 NOTE — Patient Instructions (Signed)
Anticoagulation Warfarin Dose Instructions as of 07/11/2016      Jacob SmilesSun Mon Tue Wed Thu Fri Sat   New Dose 2.5 mg 5 mg 2.5 mg 2.5 mg 2.5 mg 5 mg 2.5 mg    Description   Increase your warfarin to taking 1 tablet on Mondays and Friday and 1/2 tablet all other days of the week.  INR today is 1.4 (goal is 2-3).  Stop warfarin 5-6 days prior to hip replacement surgery.  Re-start warfarin as surgeon instructs

## 2016-07-11 NOTE — Patient Instructions (Signed)
Jacob Sanders  07/11/2016   Your procedure is scheduled on: 07-17-16  [facility]  Entrance [place]  elevators to 3rd floor to  Short Stay Center at [phone].    Call this number if you have problems the morning of surgery [phone]    Remember: ONLY 1 PERSON MAY GO WITH YOU TO SHORT STAY TO GET  READY MORNING OF YOUR SURGERY.  Do not eat food or drink liquids :After Midnight.     Take these medicines the morning of surgery with A SIP OF WATER: diltiazem(cardizem), tramadol as needed, tylenol as needed                                You may not have any metal on your body including hair pins and              piercings  Do not wear jewelry, make-up, lotions, powders or perfumes, deodorant                    Men may shave face and neck.   Do not bring valuables to the hospital. [facility] IS NOT             RESPONSIBLE   FOR VALUABLES.  Contacts, dentures or bridgework may not be worn into surgery.  Leave suitcase in the car. After surgery it may be brought to your room.               Please read over the following fact sheets you were given: _____________________________________________________________________           [facility] - Preparing for Surgery Before surgery, you can play an important role.  Because skin is not sterile, your skin needs to be as free of germs as possible.  You can reduce the number of germs on your skin by washing with CHG (chlorahexidine gluconate) soap before surgery.  CHG is an antiseptic cleaner which kills germs and bonds with the skin to continue killing germs even after washing. Please DO NOT use if you have an allergy to CHG or antibacterial soaps.  If your skin becomes reddened/irritated stop using the CHG and inform your nurse when you arrive at Short Stay. Do not shave (including legs and underarms) for at least 48 hours prior to the first CHG shower.  You may shave your face/neck. Please follow these  instructions carefully:  1.  Shower with CHG Soap the night before surgery and the  morning of Surgery.  2.  If you choose to wash your hair, wash your hair first as usual with your  normal  shampoo.  3.  After you shampoo, rinse your hair and body thoroughly to remove the  shampoo.                           4.  Use CHG as you would any other liquid soap.  You can apply chg directly  to the skin and wash                       Gently with a scrungie or clean washcloth.  5.  Apply the CHG Soap to your body ONLY FROM THE NECK DOWN.   Do not use on face/ open  Wound or open sores. Avoid contact with eyes, ears mouth and genitals (private parts).                       Wash face,  Genitals (private parts) with your normal soap.             6.  Wash thoroughly, paying special attention to the area where your surgery  will be performed.  7.  Thoroughly rinse your body with warm water from the neck down.  8.  DO NOT shower/wash with your normal soap after using and rinsing off  the CHG Soap.                9.  Pat yourself dry with a clean towel.            10.  Wear clean pajamas.            11.  Place clean sheets on your bed the night of your first shower and do not  sleep with pets. Day of Surgery : Do not apply any lotions/deodorants the morning of surgery.  Please wear clean clothes to the hospital/surgery center.  FAILURE TO FOLLOW THESE INSTRUCTIONS MAY RESULT IN THE CANCELLATION OF YOUR SURGERY PATIENT SIGNATURE_________________________________  NURSE SIGNATURE__________________________________  ________________________________________________________________________   Jacob Sanders  An incentive spirometer is a tool that can help keep your lungs clear and active. This tool measures how well you are filling your lungs with each breath. Taking long deep breaths may help reverse or decrease the chance of developing breathing (pulmonary) problems (especially  infection) following:  A long period of time when you are unable to move or be active. BEFORE THE PROCEDURE   If the spirometer includes an indicator to show your best effort, your nurse or respiratory therapist will set it to a desired goal.  If possible, sit up straight or lean slightly forward. Try not to slouch.  Hold the incentive spirometer in an upright position. INSTRUCTIONS FOR USE  1. Sit on the edge of your bed if possible, or sit up as far as you can in bed or on a chair. 2. Hold the incentive spirometer in an upright position. 3. Breathe out normally. 4. Place the mouthpiece in your mouth and seal your lips tightly around it. 5. Breathe in slowly and as deeply as possible, raising the piston or the ball toward the top of the column. 6. Hold your breath for 3-5 seconds or for as long as possible. Allow the piston or ball to fall to the bottom of the column. 7. Remove the mouthpiece from your mouth and breathe out normally. 8. Rest for a few seconds and repeat Steps 1 through 7 at least 10 times every 1-2 hours when you are awake. Take your time and take a few normal breaths between deep breaths. 9. The spirometer may include an indicator to show your best effort. Use the indicator as a goal to work toward during each repetition. 10. After each set of 10 deep breaths, practice coughing to be sure your lungs are clear. If you have an incision (the cut made at the time of surgery), support your incision when coughing by placing a pillow or rolled up towels firmly against it. Once you are able to get out of bed, walk around indoors and cough well. You may stop using the incentive spirometer when instructed by your caregiver.  RISKS AND COMPLICATIONS  Take your time so you do not get  dizzy or light-headed.  If you are in pain, you may need to take or ask for pain medication before doing incentive spirometry. It is harder to take a deep breath if you are having pain. AFTER  USE  Rest and breathe slowly and easily.  It can be helpful to keep track of a log of your progress. Your caregiver can provide you with a simple table to help with this. If you are using the spirometer at home, follow these instructions: [place] IF:   You are having difficultly using the spirometer.  You have trouble using the spirometer as often as instructed.  Your pain medication is not giving enough relief while using the spirometer.  You develop fever of 100.5 F (38.1 C) or higher. SEEK IMMEDIATE MEDICAL CARE IF:   You cough up bloody sputum that had not been present before.  You develop fever of 102 F (38.9 C) or greater.  You develop worsening pain at or near the incision site. MAKE SURE YOU:   Understand these instructions.  Will watch your condition.  Will get help right away if you are not doing well or get worse. Document Released: 05/12/2006 Document Revised: 03/24/2011 Document Reviewed: 07/13/2006 Exit[employer] Patient Information 2014 [employer], [place].   ________________________________________________________________________  WHAT IS A BLOOD TRANSFUSION? Blood Transfusion Information  A transfusion is the replacement of blood or some of its parts. Blood is made up of multiple cells which provide different functions.  Red blood cells carry oxygen and are used for blood loss replacement.  White blood cells fight against infection.  Platelets control bleeding.  Plasma helps clot blood.  Other blood products are available for specialized needs, such as hemophilia or other clotting disorders. BEFORE THE TRANSFUSION  Who gives blood for transfusions?   Healthy volunteers who are fully evaluated to make sure their blood is safe. This is blood bank blood. Transfusion therapy is the safest it has ever been in the practice of medicine. Before blood is taken from a donor, a complete history is taken to make sure that person has no history of diseases  nor engages in risky social behavior (examples are intravenous drug use or sexual activity with multiple partners). The donor's travel history is screened to minimize risk of transmitting infections, such as malaria. The donated blood is tested for signs of infectious diseases, such as HIV and hepatitis. The blood is then tested to be sure it is compatible with you in order to minimize the chance of a transfusion reaction. If you or a relative donates blood, this is often done in anticipation of surgery and is not appropriate for emergency situations. It takes many days to process the donated blood. RISKS AND COMPLICATIONS Although transfusion therapy is very safe and saves many lives, the main dangers of transfusion include:   Getting an infectious disease.  Developing a transfusion reaction. This is an allergic reaction to something in the blood you were given. Every precaution is taken to prevent this. The decision to have a blood transfusion has been considered carefully by your caregiver before blood is given. Blood is not given unless the benefits outweigh the risks. AFTER THE TRANSFUSION  Right after receiving a blood transfusion, you will usually feel much better and more energetic. This is especially true if your red blood cells have gotten low (anemic). The transfusion raises the level of the red blood cells which carry oxygen, and this usually causes an energy increase.  The nurse administering the transfusion will  monitor you carefully for complications. HOME CARE INSTRUCTIONS  No special instructions are needed after a transfusion. You may find your energy is better. Speak with your caregiver about any limitations on activity for underlying diseases you may have. SEEK MEDICAL CARE IF:   Your condition is not improving after your transfusion.  You develop redness or irritation at the intravenous (IV) site. SEEK IMMEDIATE MEDICAL CARE IF:  Any of the following symptoms occur over the  next 12 hours:  Shaking chills.  You have a temperature by mouth above 102 F (38.9 C), not controlled by medicine.  Chest, back, or muscle pain.  People around you feel you are not acting correctly or are confused.  Shortness of breath or difficulty breathing.  Dizziness and fainting.  You get a rash or develop hives.  You have a decrease in urine output.  Your urine turns a dark color or changes to pink, red, or brown. Any of the following symptoms occur over the next 10 days:  You have a temperature by mouth above 102 F (38.9 C), not controlled by medicine.  Shortness of breath.  Weakness after normal activity.  The white part of the eye turns yellow (jaundice).  You have a decrease in the amount of urine or are urinating less often.  Your urine turns a dark color or changes to pink, red, or brown. Document Released: 12/28/1999 Document Revised: 03/24/2011 Document Reviewed: 08/16/2007 [facility] Patient Information 2014 [place], [place].  _______________________________________________________________________

## 2016-07-14 ENCOUNTER — Encounter (HOSPITAL_COMMUNITY)
Admission: RE | Admit: 2016-07-14 | Discharge: 2016-07-14 | Disposition: A | Discharge status: Home / Self Care | Payer: Medicare Other | Source: Ambulatory Visit | Attending: Orthopedic Surgery | Admitting: Orthopedic Surgery

## 2016-07-14 ENCOUNTER — Encounter (HOSPITAL_COMMUNITY): Payer: Self-pay

## 2016-07-14 DIAGNOSIS — I10 Essential (primary) hypertension: Secondary | ICD-10-CM | POA: Diagnosis not present

## 2016-07-14 DIAGNOSIS — I4891 Unspecified atrial fibrillation: Secondary | ICD-10-CM | POA: Diagnosis not present

## 2016-07-14 DIAGNOSIS — K219 Gastro-esophageal reflux disease without esophagitis: Secondary | ICD-10-CM | POA: Diagnosis not present

## 2016-07-14 DIAGNOSIS — M1611 Unilateral primary osteoarthritis, right hip: Secondary | ICD-10-CM | POA: Diagnosis not present

## 2016-07-14 DIAGNOSIS — Z96642 Presence of left artificial hip joint: Secondary | ICD-10-CM | POA: Diagnosis not present

## 2016-07-14 DIAGNOSIS — E785 Hyperlipidemia, unspecified: Secondary | ICD-10-CM | POA: Diagnosis not present

## 2016-07-14 LAB — CBC
HCT: 46.2 % (ref 39.0–52.0)
Hemoglobin: 15.5 g/dL (ref 13.0–17.0)
MCH: 31 pg (ref 26.0–34.0)
MCHC: 33.5 g/dL (ref 30.0–36.0)
MCV: 92.4 fL (ref 78.0–100.0)
PLATELETS: 336 10*3/uL (ref 150–400)
RBC: 5 MIL/uL (ref 4.22–5.81)
RDW: 13.7 % (ref 11.5–15.5)
WBC: 12.3 10*3/uL — AB (ref 4.0–10.5)

## 2016-07-14 LAB — BASIC METABOLIC PANEL
ANION GAP: 9 (ref 5–15)
BUN: 20 mg/dL (ref 6–20)
CALCIUM: 9.3 mg/dL (ref 8.9–10.3)
CO2: 23 mmol/L (ref 22–32)
CREATININE: 0.8 mg/dL (ref 0.61–1.24)
Chloride: 104 mmol/L (ref 101–111)
GFR calc Af Amer: >60 mL/min (ref >60)
GLUCOSE: 103 mg/dL — AB (ref 65–99)
Potassium: 4.1 mmol/L (ref 3.5–5.1)
Sodium: 136 mmol/L (ref 135–145)

## 2016-07-14 LAB — SURGICAL PCR SCREEN
MRSA, PCR: NEGATIVE
Staphylococcus aureus: NEGATIVE

## 2016-07-17 ENCOUNTER — Inpatient Hospital Stay (HOSPITAL_COMMUNITY): Payer: Medicare Other

## 2016-07-17 ENCOUNTER — Inpatient Hospital Stay (HOSPITAL_COMMUNITY)
Admission: RE | Admit: 2016-07-17 | Discharge: 2016-07-18 | DRG: 470 | Disposition: A | Discharge status: Home Health | Payer: Medicare Other | Source: Ambulatory Visit | Attending: Orthopedic Surgery | Admitting: Orthopedic Surgery

## 2016-07-17 ENCOUNTER — Encounter (HOSPITAL_COMMUNITY): Payer: Self-pay | Admitting: *Deleted

## 2016-07-17 ENCOUNTER — Inpatient Hospital Stay (HOSPITAL_COMMUNITY): Payer: Medicare Other | Admitting: Anesthesiology

## 2016-07-17 ENCOUNTER — Encounter (HOSPITAL_COMMUNITY)
Admission: RE | Disposition: A | Discharge status: Home Health | Payer: Self-pay | Source: Ambulatory Visit | Attending: Orthopedic Surgery

## 2016-07-17 DIAGNOSIS — I10 Essential (primary) hypertension: Secondary | ICD-10-CM | POA: Diagnosis present

## 2016-07-17 DIAGNOSIS — Z96642 Presence of left artificial hip joint: Secondary | ICD-10-CM | POA: Diagnosis not present

## 2016-07-17 DIAGNOSIS — F17290 Nicotine dependence, other tobacco product, uncomplicated: Secondary | ICD-10-CM | POA: Diagnosis present

## 2016-07-17 DIAGNOSIS — K219 Gastro-esophageal reflux disease without esophagitis: Secondary | ICD-10-CM | POA: Diagnosis present

## 2016-07-17 DIAGNOSIS — M160 Bilateral primary osteoarthritis of hip: Secondary | ICD-10-CM | POA: Diagnosis not present

## 2016-07-17 DIAGNOSIS — Z471 Aftercare following joint replacement surgery: Secondary | ICD-10-CM | POA: Diagnosis not present

## 2016-07-17 DIAGNOSIS — M1611 Unilateral primary osteoarthritis, right hip: Secondary | ICD-10-CM | POA: Diagnosis not present

## 2016-07-17 DIAGNOSIS — Z09 Encounter for follow-up examination after completed treatment for conditions other than malignant neoplasm: Secondary | ICD-10-CM

## 2016-07-17 DIAGNOSIS — I4891 Unspecified atrial fibrillation: Secondary | ICD-10-CM | POA: Diagnosis not present

## 2016-07-17 DIAGNOSIS — E785 Hyperlipidemia, unspecified: Secondary | ICD-10-CM | POA: Diagnosis present

## 2016-07-17 DIAGNOSIS — M25551 Pain in right hip: Secondary | ICD-10-CM | POA: Diagnosis present

## 2016-07-17 DIAGNOSIS — Z419 Encounter for procedure for purposes other than remedying health state, unspecified: Secondary | ICD-10-CM

## 2016-07-17 DIAGNOSIS — Z96641 Presence of right artificial hip joint: Secondary | ICD-10-CM | POA: Diagnosis not present

## 2016-07-17 HISTORY — PX: TOTAL HIP ARTHROPLASTY: SHX124

## 2016-07-17 LAB — CBC
HCT: 42.7 % (ref 39.0–52.0)
Hemoglobin: 13.9 g/dL (ref 13.0–17.0)
MCH: 30.5 pg (ref 26.0–34.0)
MCHC: 32.6 g/dL (ref 30.0–36.0)
MCV: 93.6 fL (ref 78.0–100.0)
PLATELETS: 266 10*3/uL (ref 150–400)
RBC: 4.56 MIL/uL (ref 4.22–5.81)
RDW: 13.9 % (ref 11.5–15.5)
WBC: 16.9 10*3/uL — AB (ref 4.0–10.5)

## 2016-07-17 LAB — TYPE AND SCREEN
ABO/RH(D): O NEG
Antibody Screen: NEGATIVE

## 2016-07-17 LAB — CREATININE, SERUM
Creatinine, Ser: 0.8 mg/dL (ref 0.61–1.24)
GFR calc Af Amer: >60 mL/min (ref >60)
GFR calc non Af Amer: >60 mL/min (ref >60)

## 2016-07-17 IMAGING — RF DG HIP (WITH OR WITHOUT PELVIS) 1V*R*
1 series · 2 of 2 positions shown · non-contrast
Comparison: None.

CLINICAL DATA: Total hip replacement.

EXAM:
DG C-ARM 61-120 MIN-NO REPORT; DG HIP (WITH OR WITHOUT PELVIS) 1V
RIGHT

[Series 1: run · 2 of 2 slices shown]
[im 1/2]
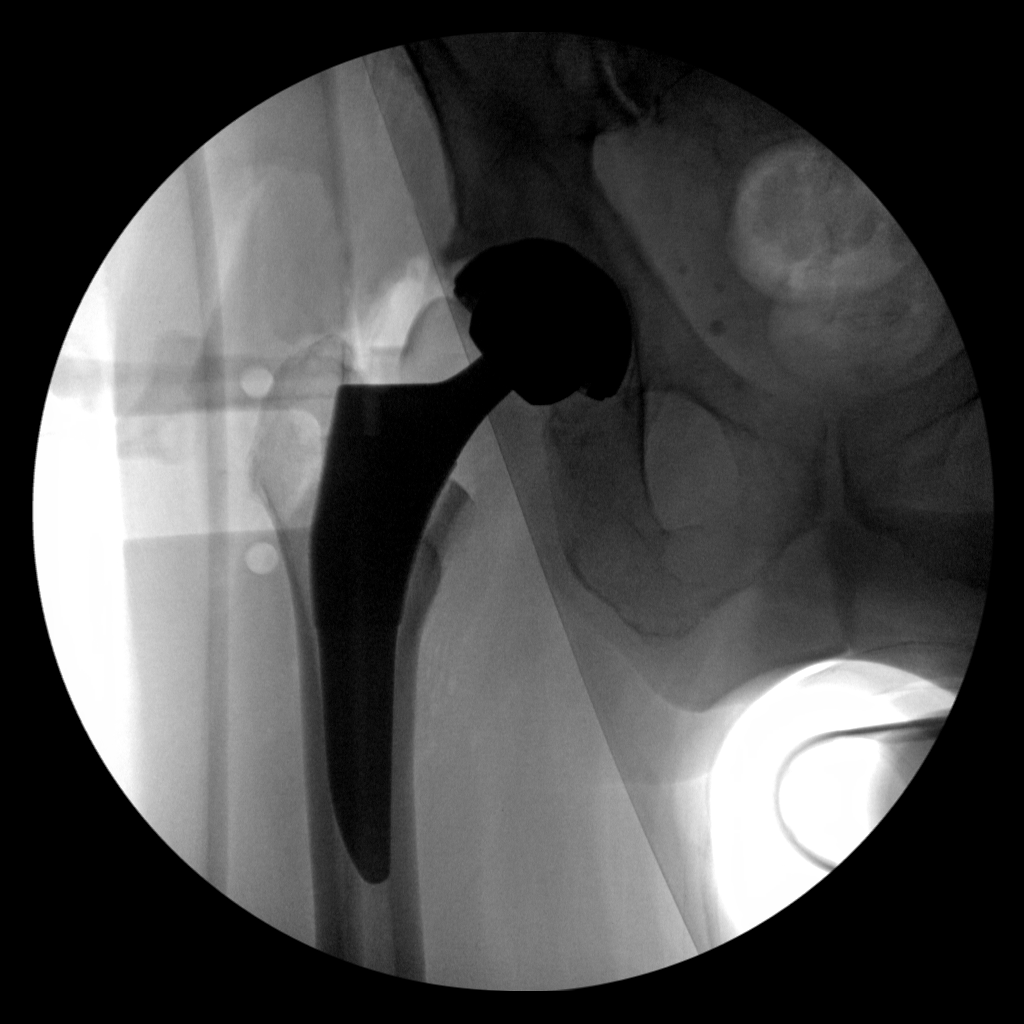
[im 2/2]
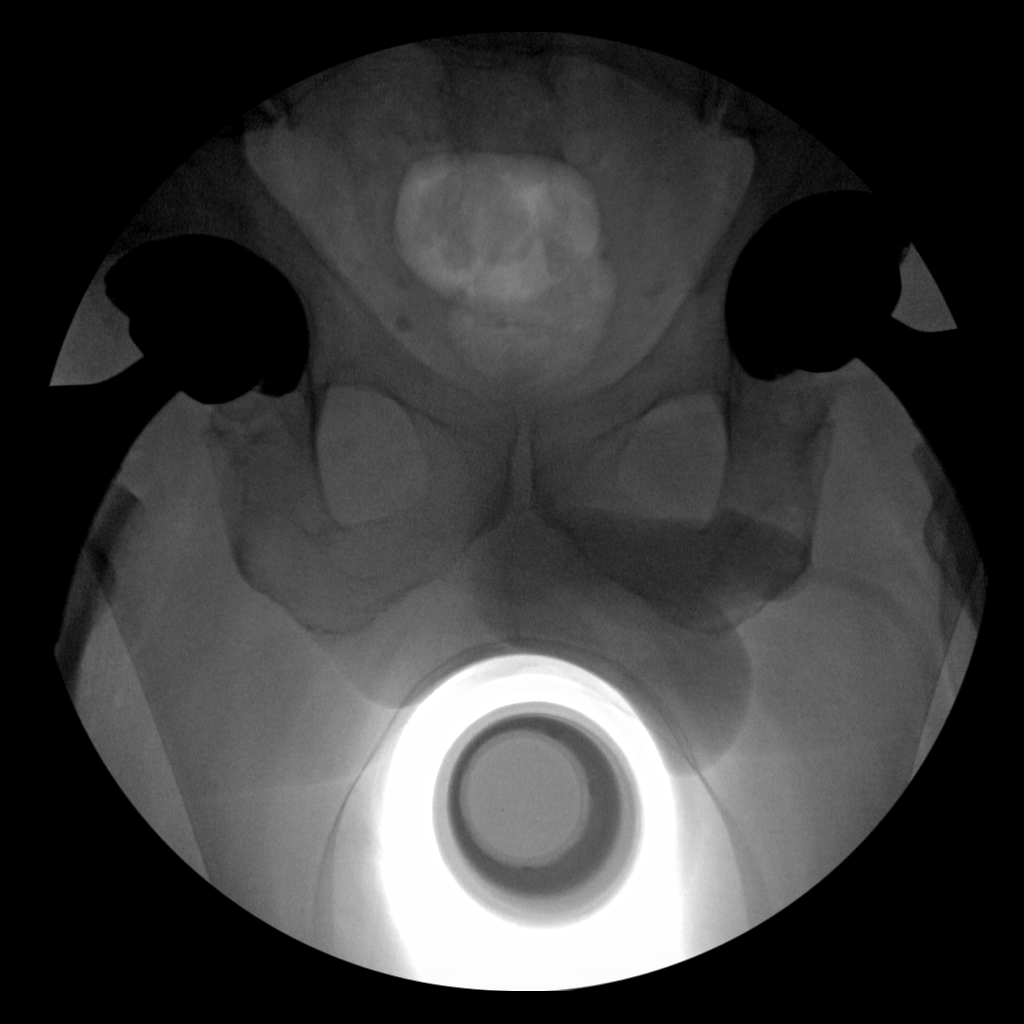

[2 of 2 positions shown; findings below may reference images not displayed]

FINDINGS: Fluoroscopic intraoperative images from total right hip arthroplasty
demonstrate placement of 3 component right hip prosthesis with
anatomic alignment. There is no evidence of fracture.

Fluoroscopy time is recorded as 0.2 minutes.
IMPRESSION: Intraoperative images from total right hip arthroplasty.

## 2016-07-17 IMAGING — DX DG PORTABLE PELVIS
1 series · 1 of 1 positions shown · non-contrast
Comparison: No recent prior.

CLINICAL DATA: Right hip replacement .

EXAM:
PORTABLE PELVIS 1-2 VIEWS

[pelvis ap]
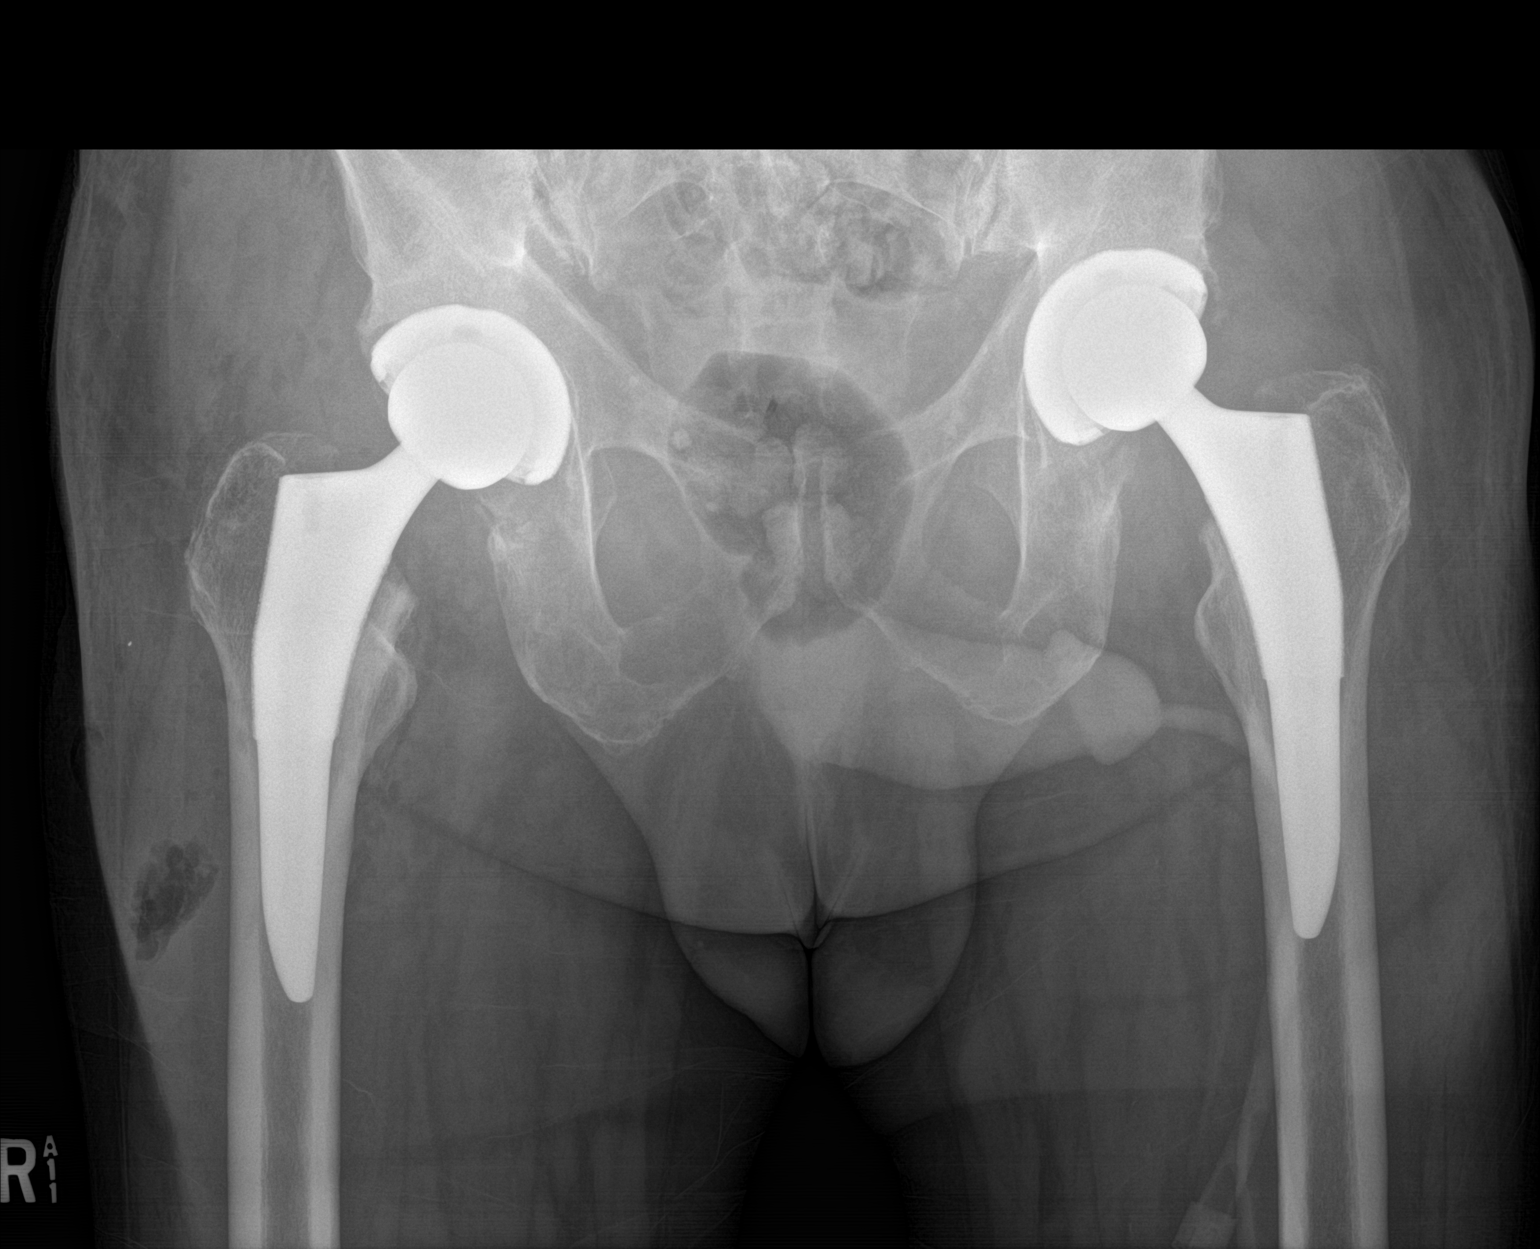

[1 of 1 positions shown; findings below may reference images not displayed]

FINDINGS: Total right hip replacement. Hardware intact. Anatomic alignment.
Prior total left hip replacement. Diffuse osteopenia degenerative
change pre
IMPRESSION: Total right hip replacement.  Hardware intact.  Anatomic alignment.

## 2016-07-17 SURGERY — ARTHROPLASTY, HIP, TOTAL, ANTERIOR APPROACH
Anesthesia: Monitor Anesthesia Care | Site: Hip | Laterality: Right

## 2016-07-17 MED ORDER — SODIUM CHLORIDE 0.9 % IV SOLN: INTRAVENOUS | Status: DC

## 2016-07-17 MED ORDER — PHENYLEPHRINE HCL 10 MG/ML IJ SOLN
INTRAMUSCULAR | Status: DC | PRN
  Administered 2016-07-17: 10 ug/min via INTRAVENOUS

## 2016-07-17 MED ORDER — WARFARIN SODIUM 5 MG PO TABS
5.0000 mg | ORAL_TABLET | Freq: Once | ORAL | Status: AC
  Administered 2016-07-17: 5 mg via ORAL
  Filled 2016-07-17: qty 1

## 2016-07-17 MED ORDER — DILTIAZEM HCL ER COATED BEADS 240 MG PO TB24
240.0000 mg | ORAL_TABLET | Freq: Every day | ORAL | Status: DC
  Filled 2016-07-17: qty 1

## 2016-07-17 MED ORDER — MIDAZOLAM HCL 2 MG/2ML IJ SOLN
INTRAMUSCULAR | Status: DC | PRN
  Administered 2016-07-17: 2 mg via INTRAVENOUS

## 2016-07-17 MED ORDER — METOCLOPRAMIDE HCL 5 MG PO TABS: 5.0000 mg | ORAL_TABLET | Freq: Three times a day (TID) | ORAL | Status: DC | PRN

## 2016-07-17 MED ORDER — ACETAMINOPHEN 325 MG PO TABS: 650.0000 mg | ORAL_TABLET | Freq: Four times a day (QID) | ORAL | Status: DC | PRN

## 2016-07-17 MED ORDER — METHOCARBAMOL 500 MG PO TABS
500.0000 mg | ORAL_TABLET | Freq: Four times a day (QID) | ORAL | Status: DC | PRN
  Administered 2016-07-17 – 2016-07-18 (×2): 500 mg via ORAL
  Filled 2016-07-17 (×2): qty 1

## 2016-07-17 MED ORDER — CEFAZOLIN SODIUM-DEXTROSE 2-4 GM/100ML-% IV SOLN
2.0000 g | INTRAVENOUS | Status: AC
  Administered 2016-07-17: 2 g via INTRAVENOUS
  Filled 2016-07-17: qty 100

## 2016-07-17 MED ORDER — METHOCARBAMOL 1000 MG/10ML IJ SOLN
500.0000 mg | Freq: Four times a day (QID) | INTRAVENOUS | Status: DC | PRN
  Filled 2016-07-17: qty 5

## 2016-07-17 MED ORDER — PANTOPRAZOLE SODIUM 40 MG PO TBEC
40.0000 mg | DELAYED_RELEASE_TABLET | Freq: Every day | ORAL | Status: DC
  Administered 2016-07-17 – 2016-07-18 (×2): 40 mg via ORAL
  Filled 2016-07-17 (×2): qty 1

## 2016-07-17 MED ORDER — LIDOCAINE 2% (20 MG/ML) 5 ML SYRINGE
INTRAMUSCULAR | Status: AC
  Filled 2016-07-17: qty 5

## 2016-07-17 MED ORDER — WARFARIN - PHARMACIST DOSING INPATIENT: Freq: Every day | Status: DC

## 2016-07-17 MED ORDER — ISOPROPYL ALCOHOL 70 % SOLN
Status: DC | PRN
  Administered 2016-07-17: 1 via TOPICAL

## 2016-07-17 MED ORDER — SODIUM CHLORIDE 0.9 % IR SOLN
Status: DC | PRN
  Administered 2016-07-17: 4000 mL

## 2016-07-17 MED ORDER — SODIUM CHLORIDE 0.9 % IJ SOLN
INTRAMUSCULAR | Status: DC | PRN
  Administered 2016-07-17: 29 mL

## 2016-07-17 MED ORDER — ONDANSETRON HCL 4 MG/2ML IJ SOLN
INTRAMUSCULAR | Status: AC
  Filled 2016-07-17: qty 2

## 2016-07-17 MED ORDER — SODIUM CHLORIDE 0.9 % IV SOLN
INTRAVENOUS | Status: DC
  Administered 2016-07-17 (×2): via INTRAVENOUS

## 2016-07-17 MED ORDER — ACETAMINOPHEN 10 MG/ML IV SOLN
1000.0000 mg | INTRAVENOUS | Status: AC
  Administered 2016-07-17: 1000 mg via INTRAVENOUS
  Filled 2016-07-17: qty 100

## 2016-07-17 MED ORDER — WATER FOR IRRIGATION, STERILE IR SOLN
Status: DC | PRN
  Administered 2016-07-17: 2000 mL via SURGICAL_CAVITY

## 2016-07-17 MED ORDER — MIDAZOLAM HCL 2 MG/2ML IJ SOLN
INTRAMUSCULAR | Status: AC
  Filled 2016-07-17: qty 2

## 2016-07-17 MED ORDER — PROPOFOL 500 MG/50ML IV EMUL
INTRAVENOUS | Status: DC | PRN
  Administered 2016-07-17: 75 ug/kg/min via INTRAVENOUS

## 2016-07-17 MED ORDER — FENTANYL CITRATE (PF) 100 MCG/2ML IJ SOLN
INTRAMUSCULAR | Status: DC | PRN
  Administered 2016-07-17 (×2): 50 ug via INTRAVENOUS

## 2016-07-17 MED ORDER — FENTANYL CITRATE (PF) 100 MCG/2ML IJ SOLN
INTRAMUSCULAR | Status: AC
  Filled 2016-07-17: qty 2

## 2016-07-17 MED ORDER — TRANEXAMIC ACID 1000 MG/10ML IV SOLN
1000.0000 mg | INTRAVENOUS | Status: AC
  Administered 2016-07-17: 1000 mg via INTRAVENOUS
  Filled 2016-07-17: qty 1100

## 2016-07-17 MED ORDER — PROPOFOL 10 MG/ML IV BOLUS
INTRAVENOUS | Status: AC
  Filled 2016-07-17: qty 60

## 2016-07-17 MED ORDER — BUPIVACAINE-EPINEPHRINE (PF) 0.25% -1:200000 IJ SOLN
INTRAMUSCULAR | Status: AC
  Filled 2016-07-17: qty 30

## 2016-07-17 MED ORDER — PHENYLEPHRINE 40 MCG/ML (10ML) SYRINGE FOR IV PUSH (FOR BLOOD PRESSURE SUPPORT)
PREFILLED_SYRINGE | INTRAVENOUS | Status: AC
  Filled 2016-07-17: qty 10

## 2016-07-17 MED ORDER — KETOROLAC TROMETHAMINE 30 MG/ML IJ SOLN
INTRAMUSCULAR | Status: AC
  Filled 2016-07-17: qty 1

## 2016-07-17 MED ORDER — CHLORHEXIDINE GLUCONATE 4 % EX LIQD
60.0000 mL | Freq: Once | CUTANEOUS | Status: AC
  Administered 2016-07-17: 4 via TOPICAL

## 2016-07-17 MED ORDER — SODIUM CHLORIDE 0.9 % IR SOLN
Status: DC | PRN
  Administered 2016-07-17: 1000 mL

## 2016-07-17 MED ORDER — HYDROMORPHONE HCL-NACL 0.5-0.9 MG/ML-% IV SOSY
0.5000 mg | PREFILLED_SYRINGE | INTRAVENOUS | Status: DC | PRN
  Administered 2016-07-17: 1 mg via INTRAVENOUS
  Filled 2016-07-17: qty 2

## 2016-07-17 MED ORDER — MENTHOL 3 MG MT LOZG: 1.0000 | LOZENGE | OROMUCOSAL | Status: DC | PRN

## 2016-07-17 MED ORDER — DIPHENHYDRAMINE HCL 12.5 MG/5ML PO ELIX: 12.5000 mg | ORAL_SOLUTION | ORAL | Status: DC | PRN

## 2016-07-17 MED ORDER — ACETAMINOPHEN 650 MG RE SUPP: 650.0000 mg | Freq: Four times a day (QID) | RECTAL | Status: DC | PRN

## 2016-07-17 MED ORDER — SODIUM CHLORIDE 0.9 % IJ SOLN
INTRAMUSCULAR | Status: AC
  Filled 2016-07-17: qty 50

## 2016-07-17 MED ORDER — DEXAMETHASONE SODIUM PHOSPHATE 10 MG/ML IJ SOLN
INTRAMUSCULAR | Status: AC
  Filled 2016-07-17: qty 1

## 2016-07-17 MED ORDER — KETOROLAC TROMETHAMINE 30 MG/ML IJ SOLN
INTRAMUSCULAR | Status: DC | PRN
  Administered 2016-07-17: 30 mg

## 2016-07-17 MED ORDER — DOCUSATE SODIUM 100 MG PO CAPS
100.0000 mg | ORAL_CAPSULE | Freq: Two times a day (BID) | ORAL | Status: DC
  Administered 2016-07-17 – 2016-07-18 (×2): 100 mg via ORAL
  Filled 2016-07-17 (×2): qty 1

## 2016-07-17 MED ORDER — HYDROCODONE-ACETAMINOPHEN 5-325 MG PO TABS
1.0000 | ORAL_TABLET | ORAL | Status: DC | PRN
  Administered 2016-07-17: 20:00:00 1 via ORAL
  Administered 2016-07-18: 08:00:00 2 via ORAL
  Filled 2016-07-17: qty 2
  Filled 2016-07-17: qty 1

## 2016-07-17 MED ORDER — TRANEXAMIC ACID 1000 MG/10ML IV SOLN
1000.0000 mg | Freq: Once | INTRAVENOUS | Status: AC
  Administered 2016-07-17: 18:00:00 1000 mg via INTRAVENOUS
  Filled 2016-07-17: qty 1100

## 2016-07-17 MED ORDER — ALUM & MAG HYDROXIDE-SIMETH 200-200-20 MG/5ML PO SUSP: 30.0000 mL | ORAL | Status: DC | PRN

## 2016-07-17 MED ORDER — ONDANSETRON HCL 4 MG/2ML IJ SOLN: 4.0000 mg | Freq: Four times a day (QID) | INTRAMUSCULAR | Status: DC | PRN

## 2016-07-17 MED ORDER — ISOPROPYL ALCOHOL 70 % SOLN
Status: AC
  Filled 2016-07-17: qty 480

## 2016-07-17 MED ORDER — PHENYLEPHRINE HCL 10 MG/ML IJ SOLN
INTRAMUSCULAR | Status: DC | PRN
  Administered 2016-07-17 (×4): 80 ug via INTRAVENOUS
  Administered 2016-07-17: 120 ug via INTRAVENOUS
  Administered 2016-07-17: 80 ug via INTRAVENOUS

## 2016-07-17 MED ORDER — POLYETHYLENE GLYCOL 3350 17 G PO PACK: 17.0000 g | PACK | Freq: Every day | ORAL | Status: DC | PRN

## 2016-07-17 MED ORDER — SENNA 8.6 MG PO TABS
2.0000 | ORAL_TABLET | Freq: Every day | ORAL | Status: DC
  Administered 2016-07-17: 21:00:00 17.2 mg via ORAL
  Filled 2016-07-17: qty 2

## 2016-07-17 MED ORDER — ENOXAPARIN SODIUM 40 MG/0.4ML ~~LOC~~ SOLN
40.0000 mg | SUBCUTANEOUS | Status: DC
  Administered 2016-07-18: 08:00:00 40 mg via SUBCUTANEOUS
  Filled 2016-07-17: qty 0.4

## 2016-07-17 MED ORDER — PHENYLEPHRINE HCL 10 MG/ML IJ SOLN
INTRAMUSCULAR | Status: AC
  Filled 2016-07-17: qty 2

## 2016-07-17 MED ORDER — PHENOL 1.4 % MT LIQD
1.0000 | OROMUCOSAL | Status: DC | PRN
  Filled 2016-07-17: qty 177

## 2016-07-17 MED ORDER — LACTATED RINGERS IV SOLN
INTRAVENOUS | Status: DC
  Administered 2016-07-17 (×2): via INTRAVENOUS

## 2016-07-17 MED ORDER — LIDOCAINE 2% (20 MG/ML) 5 ML SYRINGE
INTRAMUSCULAR | Status: DC | PRN
  Administered 2016-07-17: 60 mg via INTRAVENOUS

## 2016-07-17 MED ORDER — ONDANSETRON HCL 4 MG/2ML IJ SOLN
INTRAMUSCULAR | Status: DC | PRN
  Administered 2016-07-17: 4 mg via INTRAVENOUS

## 2016-07-17 MED ORDER — ONDANSETRON HCL 4 MG PO TABS: 4.0000 mg | ORAL_TABLET | Freq: Four times a day (QID) | ORAL | Status: DC | PRN

## 2016-07-17 MED ORDER — METOCLOPRAMIDE HCL 5 MG/ML IJ SOLN: 5.0000 mg | Freq: Three times a day (TID) | INTRAMUSCULAR | Status: DC | PRN

## 2016-07-17 MED ORDER — CHLORHEXIDINE GLUCONATE 4 % EX LIQD: 60.0000 mL | Freq: Once | CUTANEOUS | Status: DC

## 2016-07-17 MED ORDER — POVIDONE-IODINE 10 % EX SWAB
2.0000 "application " | Freq: Once | CUTANEOUS | Status: AC
  Administered 2016-07-17: 2 via TOPICAL

## 2016-07-17 MED ORDER — BUPIVACAINE IN DEXTROSE 0.75-8.25 % IT SOLN
INTRATHECAL | Status: DC | PRN
  Administered 2016-07-17: 12 mg via INTRATHECAL

## 2016-07-17 MED ORDER — BUPIVACAINE-EPINEPHRINE 0.25% -1:200000 IJ SOLN
INTRAMUSCULAR | Status: DC | PRN
  Administered 2016-07-17: 30 mL

## 2016-07-17 MED ORDER — CEFAZOLIN SODIUM-DEXTROSE 2-4 GM/100ML-% IV SOLN
2.0000 g | Freq: Four times a day (QID) | INTRAVENOUS | Status: AC
  Administered 2016-07-17 – 2016-07-18 (×2): 2 g via INTRAVENOUS
  Filled 2016-07-17 (×2): qty 100

## 2016-07-17 SURGICAL SUPPLY — 53 items
ADH SKN CLS APL DERMABOND .7 (GAUZE/BANDAGES/DRESSINGS) ×1
BAG SPEC THK2 15X12 ZIP CLS (MISCELLANEOUS)
BAG ZIPLOCK 12X15 (MISCELLANEOUS) IMPLANT
CAPT HIP TOTAL 2 ×2 IMPLANT
CHLORAPREP W/TINT 26ML (MISCELLANEOUS) ×3 IMPLANT
CLOTH BEACON ORANGE TIMEOUT ST (SAFETY) ×3 IMPLANT
COVER PERINEAL POST (MISCELLANEOUS) ×3 IMPLANT
COVER SURGICAL LIGHT HANDLE (MISCELLANEOUS) ×3 IMPLANT
DECANTER SPIKE VIAL GLASS SM (MISCELLANEOUS) ×3 IMPLANT
DERMABOND ADVANCED (GAUZE/BANDAGES/DRESSINGS) ×2
DERMABOND ADVANCED .7 DNX12 (GAUZE/BANDAGES/DRESSINGS) ×2 IMPLANT
DRAPE SHEET LG 3/4 BI-LAMINATE (DRAPES) ×9 IMPLANT
DRAPE STERI IOBAN 125X83 (DRAPES) ×3 IMPLANT
DRAPE U-SHAPE 47X51 STRL (DRAPES) ×6 IMPLANT
DRESSING AQUACEL AG SP 3.5X10 (GAUZE/BANDAGES/DRESSINGS) IMPLANT
DRSG AQUACEL AG ADV 3.5X10 (GAUZE/BANDAGES/DRESSINGS) ×3 IMPLANT
DRSG AQUACEL AG SP 3.5X10 (GAUZE/BANDAGES/DRESSINGS) ×3
ELECT PENCIL ROCKER SW 15FT (MISCELLANEOUS) ×3 IMPLANT
ELECT REM PT RETURN 15FT ADLT (MISCELLANEOUS) ×3 IMPLANT
GAUZE SPONGE 4X4 12PLY STRL (GAUZE/BANDAGES/DRESSINGS) ×3 IMPLANT
GLOVE BIO SURGEON STRL SZ8.5 (GLOVE) ×6 IMPLANT
GLOVE BIOGEL PI IND STRL 7.0 (GLOVE) IMPLANT
GLOVE BIOGEL PI IND STRL 7.5 (GLOVE) IMPLANT
GLOVE BIOGEL PI IND STRL 8.5 (GLOVE) ×1 IMPLANT
GLOVE BIOGEL PI INDICATOR 7.0 (GLOVE) ×2
GLOVE BIOGEL PI INDICATOR 7.5 (GLOVE) ×10
GLOVE BIOGEL PI INDICATOR 8.5 (GLOVE) ×2
GLOVE ECLIPSE 6.5 STRL STRAW (GLOVE) ×2 IMPLANT
GLOVE SURG SS PI 7.5 STRL IVOR (GLOVE) ×2 IMPLANT
GOWN SPEC L3 XXLG W/TWL (GOWN DISPOSABLE) ×3 IMPLANT
GOWN STRL REUS W/ TWL XL LVL3 (GOWN DISPOSABLE) IMPLANT
GOWN STRL REUS W/TWL XL LVL3 (GOWN DISPOSABLE) ×8 IMPLANT
HANDPIECE INTERPULSE COAX TIP (DISPOSABLE) ×3
HOLDER FOLEY CATH W/STRAP (MISCELLANEOUS) ×3 IMPLANT
HOOD PEEL AWAY FLYTE STAYCOOL (MISCELLANEOUS) ×6 IMPLANT
MARKER SKIN DUAL TIP RULER LAB (MISCELLANEOUS) ×3 IMPLANT
NDL SPNL 18GX3.5 QUINCKE PK (NEEDLE) ×1 IMPLANT
NEEDLE SPNL 18GX3.5 QUINCKE PK (NEEDLE) ×3 IMPLANT
PACK ANTERIOR HIP CUSTOM (KITS) ×3 IMPLANT
SAW OSC TIP CART 19.5X105X1.3 (SAW) ×3 IMPLANT
SEALER BIPOLAR AQUA 6.0 (INSTRUMENTS) ×3 IMPLANT
SET HNDPC FAN SPRY TIP SCT (DISPOSABLE) ×1 IMPLANT
SUT ETHIBOND NAB CT1 #1 30IN (SUTURE) ×6 IMPLANT
SUT MNCRL AB 3-0 PS2 18 (SUTURE) ×3 IMPLANT
SUT MON AB 2-0 CT1 36 (SUTURE) ×6 IMPLANT
SUT STRATAFIX PDO 1 14 VIOLET (SUTURE) ×6
SUT STRATFX PDO 1 14 VIOLET (SUTURE) ×2
SUT VIC AB 2-0 CT1 27 (SUTURE) ×3
SUT VIC AB 2-0 CT1 TAPERPNT 27 (SUTURE) ×1 IMPLANT
SUTURE STRATFX PDO 1 14 VIOLET (SUTURE) ×1 IMPLANT
SYR 50ML LL SCALE MARK (SYRINGE) ×3 IMPLANT
TRAY FOLEY W/METER SILVER 16FR (SET/KITS/TRAYS/PACK) ×2 IMPLANT
YANKAUER SUCT BULB TIP 10FT TU (MISCELLANEOUS) ×3 IMPLANT

## 2016-07-17 NOTE — Discharge Instructions (Signed)
°Dr. Merrillyn Ackerley °Joint Replacement Specialist °[facility] °[address] °[place], [place] [address] °[phone] ° ° °TOTAL HIP REPLACEMENT POSTOPERATIVE DIRECTIONS ° ° ° °Hip Rehabilitation, Guidelines Following Surgery  ° °WEIGHT BEARING °Weight bearing as tolerated with assist device (walker, cane, etc) as directed, use it as long as suggested by your surgeon or therapist, typically at least 4-6 weeks. ° °The results of a hip operation are greatly improved after range of motion and muscle strengthening exercises. Follow all safety measures which are given to protect your hip. If any of these exercises cause increased pain or swelling in your joint, decrease the amount until you are comfortable again. Then slowly increase the exercises. Call your caregiver if you have problems or questions.  ° °HOME CARE INSTRUCTIONS  °Most of the following instructions are designed to prevent the dislocation of your new hip.  °Remove items at home which could result in a fall. This includes throw rugs or furniture in walking pathways.  °Continue medications as instructed at time of discharge. °· You may have some home medications which will be placed on hold until you complete the course of blood thinner medication. °· You may start showering once you are discharged home. Do not remove your dressing. °Do not put on socks or shoes without following the instructions of your caregivers.   °Sit on chairs with arms. Use the chair arms to help push yourself up when arising.  °Arrange for the use of a toilet seat elevator so you are not sitting low.  °· Walk with walker as instructed.  °You may resume a sexual relationship in one month or when given the OK by your caregiver.  °Use walker as long as suggested by your caregivers.  °You may put full weight on your legs and walk as much as is comfortable. °Avoid periods of inactivity such as sitting longer than an hour when not asleep. This helps prevent  blood clots.  °You may return to work once you are cleared by your surgeon.  °Do not drive a car for 6 weeks or until released by your surgeon.  °Do not drive while taking narcotics.  °Wear elastic stockings for two weeks following surgery during the day but you may remove then at night.  °Make sure you keep all of your appointments after your operation with all of your doctors and caregivers. You should call the office at the above phone number and make an appointment for approximately two weeks after the date of your surgery. °Please pick up a stool softener and laxative for home use as long as you are requiring pain medications. °· ICE to the affected hip every three hours for 30 minutes at a time and then as needed for pain and swelling. Continue to use ice on the hip for pain and swelling from surgery. You may notice swelling that will progress down to the foot and ankle.  This is normal after surgery.  Elevate the leg when you are not up walking on it.   °It is important for you to complete the blood thinner medication as prescribed by your doctor. °· Continue to use the breathing machine which will help keep your temperature down.  It is common for your temperature to cycle up and down following surgery, especially at night when you are not up moving around and exerting yourself.  The breathing machine keeps your lungs expanded and your temperature down. ° °RANGE OF MOTION AND STRENGTHENING EXERCISES  °These exercises are   designed to help you keep full movement of your hip joint. Follow your caregiver's or physical therapist's instructions. Perform all exercises about fifteen times, three times per day or as directed. Exercise both hips, even if you have had only one joint replacement. These exercises can be done on a training (exercise) mat, on the floor, on a table or on a bed. Use whatever works the best and is most comfortable for you. Use music or television while you are exercising so that the exercises  are a pleasant break in your day. This will make your life better with the exercises acting as a break in routine you can look forward to.  °Lying on your back, slowly slide your foot toward your buttocks, raising your knee up off the floor. Then slowly slide your foot back down until your leg is straight again.  °Lying on your back spread your legs as far apart as you can without causing discomfort.  °Lying on your side, raise your upper leg and foot straight up from the floor as far as is comfortable. Slowly lower the leg and repeat.  °Lying on your back, tighten up the muscle in the front of your thigh (quadriceps muscles). You can do this by keeping your leg straight and trying to raise your heel off the floor. This helps strengthen the largest muscle supporting your knee.  °Lying on your back, tighten up the muscles of your buttocks both with the legs straight and with the knee bent at a comfortable angle while keeping your heel on the floor.  ° °SKILLED REHAB INSTRUCTIONS: °If the patient is transferred to a skilled rehab facility following release from the hospital, a list of the current medications will be sent to the facility for the patient to continue.  When discharged from the skilled rehab facility, please have the facility set up the patient's Home Health Physical Therapy prior to being released. Also, the skilled facility will be responsible for providing the patient with their medications at time of release from the facility to include their pain medication and their blood thinner medication. If the patient is still at the rehab facility at time of the two week follow up appointment, the skilled rehab facility will also need to assist the patient in arranging follow up appointment in our office and any transportation needs. ° °MAKE SURE YOU:  °Understand these instructions.  °Will watch your condition.  °Will get help right away if you are not doing well or get worse. ° °Pick up stool softner and  laxative for home use following surgery while on pain medications. °Do not remove your dressing. °The dressing is waterproof--it is OK to take showers. °Continue to use ice for pain and swelling after surgery. °Do not use any lotions or creams on the incision until instructed by your surgeon. °Total Hip Protocol. ° ° °

## 2016-07-17 NOTE — Anesthesia Procedure Notes (Signed)
Spinal  Patient location during procedure: OR Start time: 07/17/2016 12:30 PM End time: 07/17/2016 12:40 PM Staffing Anesthesiologist: Granite Godman Preanesthetic Checklist Completed: patient identified, site marked, surgical consent, pre-op evaluation, timeout performed, IV checked, risks and benefits discussed and monitors and equipment checked Spinal Block Patient position: sitting Prep: DuraPrep Patient monitoring: heart rate, cardiac monitor, continuous pulse ox and blood pressure Approach: midline Location: L2-3 Injection technique: single-shot Needle Needle type: Sprotte  Needle gauge: 24 G Needle length: 9 cm Assessment Sensory level: T4

## 2016-07-17 NOTE — Anesthesia Procedure Notes (Signed)
Procedure Name: MAC Date/Time: 07/17/2016 12:38 PM Performed by: Dione Booze Pre-anesthesia Checklist: Patient identified, Emergency Drugs available, Suction available and Patient being monitored Patient Re-evaluated:Patient Re-evaluated prior to inductionOxygen Delivery Method: Simple face mask Placement Confirmation: positive ETCO2

## 2016-07-17 NOTE — Anesthesia Preprocedure Evaluation (Addendum)
Anesthesia Evaluation  Patient identified by MRN, date of birth, ID band Patient awake    Reviewed: Allergy & Precautions, NPO status , Patient's Chart, lab work & pertinent test results  Airway Mallampati: II  TM Distance: >3 FB Neck ROM: Full    Dental  (+) Dental Advisory Given, Poor Dentition, Chipped, Missing   Pulmonary Current Smoker,    breath sounds clear to auscultation       Cardiovascular hypertension, negative cardio ROS  + dysrhythmias Atrial Fibrillation  Rhythm:Regular Rate:Normal     Neuro/Psych negative neurological ROS  negative psych ROS   GI/Hepatic negative GI ROS, Neg liver ROS, GERD  ,  Endo/Other  negative endocrine ROS  Renal/GU negative Renal ROS  negative genitourinary   Musculoskeletal  (+) Arthritis , Osteoarthritis,    Abdominal   Peds negative pediatric ROS (+)  Hematology negative hematology ROS (+)   Anesthesia Other Findings ECHO 1/18  Left ventricle:  The cavity size was normal. Systolic function was normal. The estimated ejection fraction was in the range of 55% to 60%. Wall motion was normal; there were no regional wall motion abnormalities.  Reproductive/Obstetrics negative OB ROS                          Anesthesia Physical  Anesthesia Plan  ASA: III  Anesthesia Plan: MAC and Spinal   Post-op Pain Management:    Induction: Intravenous  PONV Risk Score and Plan: 2 and Ondansetron and Dexamethasone  Airway Management Planned: Natural Airway and Simple Face Mask  Additional Equipment:   Intra-op Plan:   Post-operative Plan:   Informed Consent: I have reviewed the patients History and Physical, chart, labs and discussed the procedure including the risks, benefits and alternatives for the proposed anesthesia with the patient or authorized representative who has indicated his/her understanding and acceptance.   Dental advisory given  Plan  Discussed with: CRNA and Anesthesiologist  Anesthesia Plan Comments:        Anesthesia Quick Evaluation                                   Anesthesia Evaluation  Patient identified by MRN, date of birth, ID band Patient awake    Reviewed: Allergy & Precautions, NPO status , Patient's Chart, lab work & pertinent test results  Airway Mallampati: II  TM Distance: >3 FB Neck ROM: Full    Dental  (+) Teeth Intact, Dental Advisory Given   Pulmonary Current Smoker,    breath sounds clear to auscultation       Cardiovascular hypertension,  Rhythm:Regular Rate:Normal     Neuro/Psych    GI/Hepatic   Endo/Other    Renal/GU      Musculoskeletal   Abdominal   Peds  Hematology   Anesthesia Other Findings   Reproductive/Obstetrics                             Anesthesia Physical Anesthesia Plan  ASA: III  Anesthesia Plan: MAC and Spinal   Post-op Pain Management:    Induction: Intravenous  Airway Management Planned: Natural Airway and Simple Face Mask  Additional Equipment:   Intra-op Plan:   Post-operative Plan:   Informed Consent: I have reviewed the patients History and Physical, chart, labs and discussed the procedure including the risks, benefits and alternatives for the proposed anesthesia  with the patient or authorized representative who has indicated his/her understanding and acceptance.   Dental advisory given  Plan Discussed with: CRNA and Anesthesiologist  Anesthesia Plan Comments:         Anesthesia Quick Evaluation

## 2016-07-17 NOTE — Op Note (Signed)
OPERATIVE REPORT  SURGEON: Samson FredericBrian Jayvin Hurrell, MD   ASSISTANT: Staff.  PREOPERATIVE DIAGNOSIS: Right hip arthritis.   POSTOPERATIVE DIAGNOSIS: Right hip arthritis.   PROCEDURE: Right total hip arthroplasty, anterior approach.   IMPLANTS: DePuy Tri Lock stem, size 8, hi offset. DePuy Pinnacle Cup, size 54 mm. DePuy Altrx liner, size 36 by 54 mm, neutral. DePuy Biolox ceramic head ball, size 36 + 1.5 mm.  ANESTHESIA:  Spinal  ESTIMATED BLOOD LOSS: 400 mL.    ANTIBIOTICS: 2 g Ancef.  DRAINS: None.  COMPLICATIONS: None.   CONDITION: PACU - hemodynamically stable.   BRIEF CLINICAL NOTE: Jacob Sanders is a 71 y.o. male with a long-standing history of Right hip arthritis. After failing conservative management, the patient was indicated for total hip arthroplasty. The risks, benefits, and alternatives to the procedure were explained, and the patient elected to proceed.  PROCEDURE IN DETAIL: Surgical site was marked by myself in the pre-op holding area. Once inside the operating room, spinal anesthesia was obtained, and a foley catheter was inserted. The patient was then positioned on the Hana table. All bony prominences were well padded. The hip was prepped and draped in the normal sterile surgical fashion. A time-out was called verifying side and site of surgery. The patient received IV antibiotics within 60 minutes of beginning the procedure.  The direct anterior approach to the hip was performed through the Hueter interval. Lateral femoral circumflex vessels were treated with the Auqumantys. The anterior capsule was exposed and an inverted T capsulotomy was made.The femoral neck cut was made to the level of the templated cut. A corkscrew was placed into the head and the head was removed. The femoral head was found to have eburnated bone. The head was passed to the back table and was measured.  Acetabular exposure was achieved, and the pulvinar and labrum were excised.  Sequential reaming of the acetabulum was then performed up to a size 53 mm reamer. A 54 mm cup was then opened and impacted into place at approximately 40 degrees of abduction and 20 degrees of anteversion. The final polyethylene liner was impacted into place and acetabular osteophytes were removed.   I then gained femoral exposure taking care to protect the abductors and greater trochanter. This was performed using standard external rotation, extension, and adduction. The capsule was peeled off the inner aspect of the greater trochanter, taking care to preserve the short external rotators. A cookie cutter was used to enter the femoral canal, and then the femoral canal finder was placed. Sequential broaching was performed up to a size 8. Calcar planer was used on the femoral neck remnant. I placed a hi offset neck and a trial head ball. The hip was reduced. Leg lengths and offset were checked fluoroscopically. The hip was dislocated and trial components were removed. The final implants were placed, and the hip was reduced.  Fluoroscopy was used to confirm component position and leg lengths. At 90 degrees of external rotation and full extension, the hip was stable to an anterior directed force.  The wound was copiously irrigated with normal saline using pulse lavage. Marcaine solution was injected into the periarticular soft tissue. The wound was closed in layers using #1 Stratafix for the fascia, 2-0 Vicryl for the subcutaneous fat, 2-0 Monocryl for the deep dermal layer, 3-0 running Monocryl subcuticular stitch, and Dermabond for the skin. Once the glue was fully dried, an Aquacell Ag dressing was applied. The patient was transported to the recovery room in stable condition.  Sponge, needle, and instrument counts were correct at the end of the case x2. The patient tolerated the procedure well and there were no known complications.

## 2016-07-17 NOTE — Interval H&P Note (Signed)
History and Physical Interval Note:  07/17/2016 12:19 PM  Jacob Sanders  has presented today for surgery, with the diagnosis of Degenerative joint disease right hip  The various methods of treatment have been discussed with the patient and family. After consideration of risks, benefits and other options for treatment, the patient has consented to  Procedure(s) with comments: RIGHT TOTAL HIP ARTHROPLASTY ANTERIOR APPROACH (Right) - needs RNFA as a surgical intervention .  The patient's history has been reviewed, patient examined, no change in status, stable for surgery.  I have reviewed the patient's chart and labs.  Questions were answered to the patient's satisfaction.     Jacob Sanders, Cloyde ReamsBrian James

## 2016-07-17 NOTE — H&P (View-Only) (Signed)
TOTAL HIP ADMISSION H&P  Patient is admitted for right total hip arthroplasty.  Subjective:  Chief Complaint: right hip pain  HPI: Jacob Sanders, 71 y.o. male, has a history of pain and functional disability in the right hip(s) due to arthritis and patient has failed non-surgical conservative treatments for greater than 12 weeks to include NSAID's and/or analgesics, flexibility and strengthening excercises, supervised PT with diminished ADL's post treatment, use of assistive devices, weight reduction as appropriate and activity modification.  Onset of symptoms was gradual starting 5 years ago with rapidlly worsening course since that time.The patient noted no past surgery on the right hip(s).  Patient currently rates pain in the right hip at 10 out of 10 with activity. Patient has night pain, worsening of pain with activity and weight bearing, pain that interfers with activities of daily living, pain with passive range of motion and joint swelling. Patient has evidence of subchondral cysts, subchondral sclerosis, periarticular osteophytes and joint space narrowing by imaging studies. This condition presents safety issues increasing the risk of falls.  There is no current active infection.  Patient Active Problem List   Diagnosis Date Noted  . Osteoarthritis of left hip 05/22/2016  . Osteoarthritis, hip, bilateral 03/27/2016  . Atrial fibrillation (HCC) 03/21/2016  . Overweight (BMI 25.0-29.9) 03/21/2016  . Pain in both lower extremities 02/27/2016  . Tachycardia 02/27/2016  . History of atrial fibrillation without current medication 02/27/2016  . Primary osteoarthritis of both knees 11/06/2015  . Current smoker 12/14/2014  . Vitamin D deficiency 05/17/2014  . GERD (gastroesophageal reflux disease) 05/15/2014  . Fatigue 03/10/2014  . Arthritis 03/10/2014  . Essential hypertension, benign 04/20/2012  . Gout, unspecified 04/20/2012  . Hyperlipemia 04/20/2012   Past Medical History:   Diagnosis Date  . Arthritis   . GERD (gastroesophageal reflux disease)   . Hyperlipidemia   . Hypertension     Past Surgical History:  Procedure Laterality Date  . SPINE SURGERY     Resulting staph infection  . TONSILLECTOMY     childhood   . TOOTH EXTRACTION  3 weeks ago ;   had abcess beneath tooth; experiened some bleeding for 20 hours post op  . TOTAL HIP ARTHROPLASTY Left 05/22/2016   Procedure: LEFT TOTAL HIP ARTHROPLASTY ANTERIOR APPROACH;  Surgeon: Shiree Altemus, MD;  Location: [facility];  Service: Orthopedics;  Laterality: Left;  Dr. requesting RNFA     (Not in a hospital admission) Allergies  Allergen Reactions  . Prednisone Swelling    Social History  Substance Use Topics  . Smoking status: Current Every Day Smoker    Packs/day: 0.50    Years: 30.00    Types: Cigars  . Smokeless tobacco: Never Used  . Alcohol use No    Family History  Problem Relation Age of Onset  . Aneurysm Father      Review of Systems  Constitutional: Negative.   HENT: Negative.   Eyes: Negative.   Respiratory: Negative.   Cardiovascular: Negative.   Gastrointestinal: Negative.   Genitourinary: Negative.   Musculoskeletal: Positive for joint pain.  Skin: Negative.   Neurological: Negative.   Endo/Heme/Allergies: Negative.   Psychiatric/Behavioral: Negative.     Objective:  Physical Exam  Vitals reviewed. Constitutional: He is oriented to person, place, and time. He appears well-developed and well-nourished.  HENT:  Head: Normocephalic and atraumatic.  Eyes: Conjunctivae and EOM are normal. Pupils are equal, round, and reactive to light.  Neck: Normal range of motion. Neck supple.  Cardiovascular: Normal   rate and intact distal pulses.   Respiratory: Effort normal. No respiratory distress.  GI: Soft. He exhibits no distension.  Genitourinary:  Genitourinary Comments: deferred  Musculoskeletal:       Right hip: He exhibits decreased range of motion.  Neurological: He is  alert and oriented to person, place, and time. He has normal reflexes.  Skin: Skin is warm and dry.  Psychiatric: He has a normal mood and affect. His behavior is normal. Judgment and thought content normal.    Vital signs in last 24 hours: @VSRANGES@  Labs:   Estimated body mass index is 26.14 kg/m as calculated from the following:   Height as of 05/29/16: 5' 10" (1.778 m).   Weight as of 05/29/16: 82.6 kg (182 lb 3.2 oz).   Imaging Review Plain radiographs demonstrate severe degenerative joint disease of the right hip(s). The bone quality appears to be adequate for age and reported activity level.  Assessment/Plan:  End stage arthritis, right hip(s)  The patient history, physical examination, clinical judgement of the provider and imaging studies are consistent with end stage degenerative joint disease of the right hip(s) and total hip arthroplasty is deemed medically necessary. The treatment options including medical management, injection therapy, arthroscopy and arthroplasty were discussed at length. The risks and benefits of total hip arthroplasty were presented and reviewed. The risks due to aseptic loosening, infection, stiffness, dislocation/subluxation,  thromboembolic complications and other imponderables were discussed.  The patient acknowledged the explanation, agreed to proceed with the plan and consent was signed. Patient is being admitted for inpatient treatment for surgery, pain control, PT, OT, prophylactic antibiotics, VTE prophylaxis, progressive ambulation and ADL's and discharge planning.The patient is planning to be discharged home with HEP. 

## 2016-07-17 NOTE — Transfer of Care (Signed)
Immediate Anesthesia Transfer of Care Note  Patient: Jacob Sanders  Procedure(s) Performed: Procedure(s) with comments: RIGHT TOTAL HIP ARTHROPLASTY ANTERIOR APPROACH (Right) - needs RNFA  Patient Location: PACU  Anesthesia Type:MAC and Spinal  Level of Consciousness: awake, alert , oriented and patient cooperative  Airway & Oxygen Therapy: Patient Spontanous Breathing and Patient connected to face mask oxygen  Post-op Assessment: Report given to RN and Post -op Vital signs reviewed and stable  Post vital signs: Reviewed and stable  Last Vitals:  Vitals:   07/17/16 1040  BP: (!) 124/96  Pulse: 85  Resp: 18  Temp: 36.7 C    Last Pain:  Vitals:   07/17/16 1040  TempSrc: Oral      Patients Stated Pain Goal: 4 (56/38/93 7342)  Complications: No apparent anesthesia complications

## 2016-07-17 NOTE — Progress Notes (Signed)
ANTICOAGULATION CONSULT NOTE - Initial Consult  Pharmacy Consult for warfarin Indication: atrial fibrillation and VTE prophylaxis  Allergies  Allergen Reactions  . Prednisone Swelling    Patient Measurements: Height: 5\' 10"  (177.8 cm) Weight: 176 lb (79.8 kg) IBW/kg (Calculated) : 73 Heparin Dosing Weight:   Vital Signs: Temp: 97.9 F (36.6 C) (07/05 1530) Temp Source: Oral (07/05 1040) BP: 133/91 (07/05 1530) Pulse Rate: 60 (07/05 1530)  Labs: No results for input(s): HGB, HCT, PLT, APTT, LABPROT, INR, HEPARINUNFRC, HEPRLOWMOCWT, CREATININE, CKTOTAL, CKMB, TROPONINI in the last 72 hours.  Estimated Creatinine Clearance: 91.3 mL/min (by C-G formula based on SCr of 0.8 mg/dL).   Medical History: Past Medical History:  Diagnosis Date  . Arthritis   . GERD (gastroesophageal reflux disease)   . Hyperlipidemia   . Hypertension     Assessment: [age] YOM with h/o afib on chronic warfarin therapy.  He is s/p anterior THA 7/5.  Pharmacy asked to dose warfarin while in hospital. Last INR 6/29 = 1.4  Home warfarin regimen: patient reports taking 2.5mg  daily but his last warfarin clinic note says to take 2.5mg  daily with 5mg  Mon/Fri  Today, 07/17/2016  Patient ordered enoxaparin 40mg  q24h  Goal of Therapy:  INR goal 2.3 - 3 per outpatient records from coumadin clinic  Plan:   Warfarin 5mg  PO x 1 tonight  Daily INR  Enoxaparin to stop when INR >= 1.8  Juliette Alcideustin Joud Pettinato, PharmD, BCPS.   Pager: [phone] 07/17/2016 4:06 PM

## 2016-07-17 NOTE — Anesthesia Postprocedure Evaluation (Signed)
Anesthesia Post Note  Patient: Jacob Sanders  Procedure(s) Performed: Procedure(s) (LRB): RIGHT TOTAL HIP ARTHROPLASTY ANTERIOR APPROACH (Right)     Patient location during evaluation: PACU Anesthesia Type: MAC Level of consciousness: awake and alert Pain management: pain level controlled Vital Signs Assessment: post-procedure vital signs reviewed and stable Respiratory status: spontaneous breathing, nonlabored ventilation, respiratory function stable and patient connected to nasal cannula oxygen Cardiovascular status: stable and blood pressure returned to baseline Anesthetic complications: no    Last Vitals:  Vitals:   07/17/16 1455 07/17/16 1500  BP: 101/83 117/84  Pulse: 66 75  Resp: 16 13  Temp:      Last Pain:  Vitals:   07/17/16 1040  TempSrc: Oral                 [name]

## 2016-07-18 ENCOUNTER — Encounter (HOSPITAL_COMMUNITY): Payer: Self-pay | Admitting: Orthopedic Surgery

## 2016-07-18 LAB — CBC
HEMATOCRIT: 35.4 % — AB (ref 39.0–52.0)
Hemoglobin: 11.9 g/dL — ABNORMAL LOW (ref 13.0–17.0)
MCH: 30.7 pg (ref 26.0–34.0)
MCHC: 33.6 g/dL (ref 30.0–36.0)
MCV: 91.5 fL (ref 78.0–100.0)
Platelets: 279 10*3/uL (ref 150–400)
RBC: 3.87 MIL/uL — AB (ref 4.22–5.81)
RDW: 13.3 % (ref 11.5–15.5)
WBC: 15.7 10*3/uL — AB (ref 4.0–10.5)

## 2016-07-18 LAB — BASIC METABOLIC PANEL
ANION GAP: 7 (ref 5–15)
BUN: 13 mg/dL (ref 6–20)
CHLORIDE: 104 mmol/L (ref 101–111)
CO2: 25 mmol/L (ref 22–32)
Calcium: 8.8 mg/dL — ABNORMAL LOW (ref 8.9–10.3)
Creatinine, Ser: 0.74 mg/dL (ref 0.61–1.24)
GFR calc Af Amer: >60 mL/min (ref >60)
GFR calc non Af Amer: >60 mL/min (ref >60)
GLUCOSE: 157 mg/dL — AB (ref 65–99)
Potassium: 4.2 mmol/L (ref 3.5–5.1)
Sodium: 136 mmol/L (ref 135–145)

## 2016-07-18 LAB — PROTIME-INR
INR: 1.12
Prothrombin Time: 14.5 seconds (ref 11.4–15.2)

## 2016-07-18 MED ORDER — DOCUSATE SODIUM 100 MG PO CAPS: 100.0000 mg | ORAL_CAPSULE | Freq: Two times a day (BID) | ORAL | 1 refills | Status: DC

## 2016-07-18 MED ORDER — WARFARIN SODIUM 5 MG PO TABS: 5.0000 mg | ORAL_TABLET | Freq: Once | ORAL | Status: DC

## 2016-07-18 MED ORDER — ENOXAPARIN SODIUM 40 MG/0.4ML ~~LOC~~ SOLN: 40.0000 mg | SUBCUTANEOUS | 0 refills | Status: DC

## 2016-07-18 MED ORDER — ONDANSETRON HCL 4 MG PO TABS: 4.0000 mg | ORAL_TABLET | Freq: Four times a day (QID) | ORAL | 0 refills | Status: DC | PRN

## 2016-07-18 MED ORDER — SENNA 8.6 MG PO TABS: 2.0000 | ORAL_TABLET | Freq: Every day | ORAL | 0 refills | Status: DC

## 2016-07-18 MED ORDER — HYDROCODONE-ACETAMINOPHEN 5-325 MG PO TABS: 1.0000 | ORAL_TABLET | ORAL | 0 refills | Status: DC | PRN

## 2016-07-18 MED ORDER — DILTIAZEM HCL ER COATED BEADS 240 MG PO CP24
240.0000 mg | ORAL_CAPSULE | Freq: Every day | ORAL | Status: DC
  Administered 2016-07-18: 240 mg via ORAL
  Filled 2016-07-18: qty 1

## 2016-07-18 NOTE — Progress Notes (Signed)
   Subjective:  Patient reports pain as mild to moderate.  Denies N/V/CP/SOB.  Objective:   VITALS:   Vitals:   07/18/16 0150 07/18/16 0557 07/18/16 0750 07/18/16 1018  BP: (!) 128/92 133/87 (!) 152/115 139/88  Pulse: 83 82 (!) 110 (!) 106  Resp: 16 16  16   Temp: 97.9 F (36.6 C) 97.9 F (36.6 C)  97.8 F (36.6 C)  TempSrc: Oral Oral Oral Oral  SpO2: 100% 99%  99%  Weight:   79.8 kg (176 lb)   Height:   5\' 10"  (1.778 m)     ABD soft Sensation intact distally Intact pulses distally Dorsiflexion/Plantar flexion intact Incision: dressing C/D/I Compartment soft   Lab Results  Component Value Date   WBC 15.7 (H) 07/18/2016   HGB 11.9 (L) 07/18/2016   HCT 35.4 (L) 07/18/2016   MCV 91.5 07/18/2016   PLT 279 07/18/2016   BMET    Component Value Date/Time   NA 136 07/18/2016 0441   NA 144 03/21/2016 0951   K 4.2 07/18/2016 0441   CL 104 07/18/2016 0441   CO2 25 07/18/2016 0441   GLUCOSE 157 (H) 07/18/2016 0441   BUN 13 07/18/2016 0441   BUN 21 03/21/2016 0951   CREATININE 0.74 07/18/2016 0441   CREATININE 1.01 04/19/2012 0926   CALCIUM 8.8 (L) 07/18/2016 0441   GFRNONAA >60 07/18/2016 0441   GFRAA >60 07/18/2016 0441     Assessment/Plan: 1 Day Post-Op   Principal Problem:   Osteoarthritis of right hip   WBAT with walker Coumadin with lovenox bridge PO pain control PT/OT D/C home today with HEP   Jacob Sanders, Jacob Sanders 07/18/2016, 11:19 AM   Jacob FredericBrian Dann Ventress, MD Cell [phone]

## 2016-07-18 NOTE — Progress Notes (Addendum)
Discharge planning, spoke with patient at bedside. Patient unsure if he needed HH, states he thought HEP. Further clarification from attending, [facility].  [facility] Contacted [facility] for referral. They have accepted referral. Has RW and 3n1. [phone]

## 2016-07-18 NOTE — Evaluation (Signed)
Physical Therapy Evaluation Patient Details Name: Jacob Sanders Procida MRN: [id number] DOB: [birth year] Today's Date: 07/18/2016   History of Present Illness  Pt s/p R THR and with hx of recent L THR  Clinical Impression  Pt s/p R THR and presents with decreased R LE strength/ROM and post op pain limiting functional mobility.  Pt plans dc home this date with family assist.  Pt reports he has all therex paperwork from recent L THR and feels comfortable with progressing on own with assist of spouse.  Reviewed stairs and car transfers.    Follow Up Recommendations No PT follow up;DC plan and follow up therapy as arranged by surgeon    Equipment Recommendations  None recommended by PT    Recommendations for Other Services       Precautions / Restrictions Precautions Precautions: Fall Restrictions Weight Bearing Restrictions: No Other Position/Activity Restrictions: WBAT      Mobility  Bed Mobility Overal bed mobility: Needs Assistance Bed Mobility: Supine to Sit     Supine to sit: Min assist     General bed mobility comments: min cues for sequence and use of L LE to self assist.  Physical assist to manage R LE  Transfers Overall transfer level: Needs assistance Equipment used: Rolling walker (2 wheeled) Transfers: Sit to/from Stand Sit to Stand: Min guard         General transfer comment: cues for LE management and use of UEs to self assist  Ambulation/Gait Ambulation/Gait assistance: Min guard;Supervision Ambulation Distance (Feet): 200 Feet Assistive device: Rolling walker (2 wheeled) Gait Pattern/deviations: Step-to pattern;Step-through pattern;Decreased step length - right;Decreased step length - left;Shuffle;Trunk flexed Gait velocity: decr Gait velocity interpretation: Below normal speed for age/gender General Gait Details: cues for posture and position from RW  Stairs Stairs: Yes Stairs assistance: Min guard Stair Management: No rails;Two rails;Step to  pattern;Forwards;With walker Number of Stairs: 4 General stair comments: single step fwd wtih RW and 3 steps with bil rails.  Cues for sequence  Wheelchair Mobility    Modified Rankin (Stroke Patients Only)       Balance Overall balance assessment: No apparent balance deficits (not formally assessed)                                           Pertinent Vitals/Pain Pain Assessment: 0-10 Pain Score: 3  Pain Location: R hip Pain Descriptors / Indicators: Sore Pain Intervention(s): Limited activity within patient's tolerance;Monitored during session;Premedicated before session;Ice applied    Home Living Family/patient expects to be discharged to:: Private residence Living Arrangements: Spouse/significant other Available Help at Discharge: Family Type of Home: House Home Access: Stairs to enter Entrance Stairs-Rails: Right;Left;Can reach both Entrance Stairs-Number of Steps: 1+2 Home Layout: One level Home Equipment: Bedside commode;Walker - 2 wheels;Cane - single point      Prior Function Level of Independence: Independent with assistive device(s)         Comments: uses cane     Hand Dominance        Extremity/Trunk Assessment   Upper Extremity Assessment Upper Extremity Assessment: Overall WFL for tasks assessed    Lower Extremity Assessment Lower Extremity Assessment: RLE deficits/detail RLE Deficits / Details: Strength at hip 2+/5 with AAROM at hip to 80 flex and 15 abd    Cervical / Trunk Assessment Cervical / Trunk Assessment: Normal  Communication   Communication: No difficulties  Cognition  Arousal/Alertness: Awake/alert Behavior During Therapy: WFL for tasks assessed/performed Overall Cognitive Status: Within Functional Limits for tasks assessed                                        General Comments      Exercises Total Joint Exercises Ankle Circles/Pumps: AROM;Both;20 reps;Supine Quad Sets: AROM;Both;10  reps;Supine Heel Slides: AAROM;Right;20 reps;Supine Hip ABduction/ADduction: AAROM;Right;15 reps;Supine   Assessment/Plan    PT Assessment Patient needs continued PT services  PT Problem List Decreased strength;Decreased range of motion;Decreased activity tolerance;Decreased mobility;Decreased knowledge of use of DME;Pain       PT Treatment Interventions DME instruction;Gait training;Stair training;Functional mobility training;Therapeutic activities;Therapeutic exercise;Patient/family education    PT Goals (Current goals can be found in the Care Plan section)  Acute Rehab PT Goals Patient Stated Goal: Regain IND and walk without pain for first time in 7 years PT Goal Formulation: With patient Time For Goal Achievement: 07/19/16 Potential to Achieve Goals: Good    Frequency 7X/week   Barriers to discharge        Co-evaluation               AM-PAC PT "6 Clicks" Daily Activity  Outcome Measure Difficulty turning over in bed (including adjusting bedclothes, sheets and blankets)?: A Little Difficulty moving from lying on back to sitting on the side of the bed? : Total Difficulty sitting down on and standing up from a chair with arms (Sanders.g., wheelchair, bedside commode, etc,.)?: A Little Help needed moving to and from a bed to chair (including a wheelchair)?: A Little Help needed walking in hospital room?: A Little Help needed climbing 3-5 steps with a railing? : A Little 6 Click Score: 16    End of Session Equipment Utilized During Treatment: Gait belt Activity Tolerance: Patient tolerated treatment well Patient left: in chair;with call bell/phone within reach Nurse Communication: Mobility status PT Visit Diagnosis: Difficulty in walking, not elsewhere classified (R26.2)    Time: 1610-9604 PT Time Calculation (min) (ACUTE ONLY): 33 min   Charges:   PT Evaluation $PT Eval Low Complexity: 1 Procedure PT Treatments $Therapeutic Exercise: 8-22 mins   PT G Codes:         Pg [phone]   Deshanti Adcox 07/18/2016, 9:58 AM

## 2016-07-18 NOTE — Discharge Summary (Signed)
Physician Discharge Summary  Patient ID: Jacob Sanders MRN: [id number] DOB/AGE: August 27, 1945 71 y.o.  Admit date: 07/17/2016 Discharge date: 07/18/2016  Admission Diagnoses:  Osteoarthritis of right hip  Discharge Diagnoses:  Principal Problem:   Osteoarthritis of right hip   Past Medical History:  Diagnosis Date  . Arthritis   . GERD (gastroesophageal reflux disease)   . Hyperlipidemia   . Hypertension     Surgeries: Procedure(s): RIGHT TOTAL HIP ARTHROPLASTY ANTERIOR APPROACH on 07/17/2016   Consultants (if any):   Discharged Condition: Improved  Hospital Course: Jacob Sanders is an 71 y.o. male who was admitted 07/17/2016 with a diagnosis of Osteoarthritis of right hip and went to the operating room on 07/17/2016 and underwent the above named procedures.    He was given perioperative antibiotics:  Anti-infectives    Start     Dose/Rate Route Frequency Ordered Stop   07/17/16 1800  ceFAZolin (ANCEF) IVPB 2g/100 mL premix     2 g 200 mL/hr over 30 Minutes Intravenous Every 6 hours 07/17/16 1529 07/18/16 0034   07/17/16 1037  ceFAZolin (ANCEF) IVPB 2g/100 mL premix     2 g 200 mL/hr over 30 Minutes Intravenous On call to O.R. 07/17/16 1037 07/17/16 1306    .  He was given sequential compression devices, early ambulation, and coumadin with Lovenox bridge for DVT prophylaxis.  He benefited maximally from the hospital stay and there were no complications.    Recent vital signs:  Vitals:   07/18/16 0750 07/18/16 1018  BP: (!) 152/115 139/88  Pulse: (!) 110 (!) 106  Resp:  16  Temp:  97.8 F (36.6 C)    Recent laboratory studies:  Lab Results  Component Value Date   HGB 11.9 (L) 07/18/2016   HGB 13.9 07/17/2016   HGB 15.5 07/14/2016   Lab Results  Component Value Date   WBC 15.7 (H) 07/18/2016   PLT 279 07/18/2016   Lab Results  Component Value Date   INR 1.12 07/18/2016   Lab Results  Component Value Date   NA 136 07/18/2016   K 4.2 07/18/2016   CL  104 07/18/2016   CO2 25 07/18/2016   BUN 13 07/18/2016   CREATININE 0.74 07/18/2016   GLUCOSE 157 (H) 07/18/2016    Discharge Medications:   Allergies as of 07/18/2016      Reactions   Prednisone Swelling      Medication List    STOP taking these medications   traMADol 50 MG tablet Commonly known as:  ULTRAM   TYLENOL ARTHRITIS PAIN 650 MG CR tablet Generic drug:  acetaminophen     TAKE these medications   diltiazem 240 MG 24 hr tablet Commonly known as:  CARDIZEM LA Take 1 tablet (240 mg total) by mouth daily.   docusate sodium 100 MG capsule Commonly known as:  COLACE Take 1 capsule (100 mg total) by mouth 2 (two) times daily.   enoxaparin 40 MG/0.4ML injection Commonly known as:  LOVENOX Inject 0.4 mLs (40 mg total) into the skin daily. Start taking on:  07/19/2016   HYDROcodone-acetaminophen 5-325 MG tablet Commonly known as:  NORCO/VICODIN Take 1-2 tablets by mouth every 4 (four) hours as needed (breakthrough pain).   omeprazole 20 MG capsule Commonly known as:  PRILOSEC TAKE 1 CAPSULE BY MOUTH  DAILY AS NEEDED What changed:  See the new instructions.   ondansetron 4 MG tablet Commonly known as:  ZOFRAN Take 1 tablet (4 mg total) by mouth every 6 (  six) hours as needed for nausea.   senna 8.6 MG Tabs tablet Commonly known as:  SENOKOT Take 2 tablets (17.2 mg total) by mouth at bedtime.   warfarin 5 MG tablet Commonly known as:  COUMADIN Take 1 tablet (5 mg total) by mouth daily at 6 PM. What changed:  how much to take       Diagnostic Studies: Dg Pelvis Portable  Result Date: 07/17/2016 CLINICAL DATA:  Right hip replacement . EXAM: PORTABLE PELVIS 1-2 VIEWS COMPARISON:  No recent prior. FINDINGS: Total right hip replacement. Hardware intact. Anatomic alignment. Prior total left hip replacement. Diffuse osteopenia degenerative change pre IMPRESSION: Total right hip replacement.  Hardware intact.  Anatomic alignment. Electronically Signed   By: Maisie Fus   Register   On: 07/17/2016 15:04   Dg C-arm 61-120 Min-no Report  Result Date: 07/17/2016 Fluoroscopy was utilized by the requesting physician.  No radiographic interpretation.   Dg Hip Unilat With Pelvis 1v Right  Result Date: 07/17/2016 CLINICAL DATA:  Total hip replacement. EXAM: DG C-ARM 61-120 MIN-NO REPORT; DG HIP (WITH OR WITHOUT PELVIS) 1V RIGHT COMPARISON:  None. FINDINGS: Fluoroscopic intraoperative images from total right hip arthroplasty demonstrate placement of 3 component right hip prosthesis with anatomic alignment. There is no evidence of fracture. Fluoroscopy time is recorded as 0.2 minutes. IMPRESSION: Intraoperative images from total right hip arthroplasty. Electronically Signed   By: Ted Mcalpine M.D.   On: 07/17/2016 14:35    Disposition: 01-Home or Self Care  Discharge Instructions    Call MD / Call 911    Complete by:  As directed    If you experience chest pain or shortness of breath, CALL 911 and be transported to the hospital emergency room.  If you develope a fever above 101 F, pus (white drainage) or increased drainage or redness at the wound, or calf pain, call your surgeon's office.   Constipation Prevention    Complete by:  As directed    Drink plenty of fluids.  Prune juice may be helpful.  You may use a stool softener, such as Colace (over the counter) 100 mg twice a day.  Use MiraLax (over the counter) for constipation as needed.   Diet - low sodium heart healthy    Complete by:  As directed    Discharge instructions    Complete by:  As directed    Resume home coumadin dose Administer daily Lovenox shots until INR is greater than 1.8 - then stop the shots   Driving restrictions    Complete by:  As directed    No driving for 6 weeks   Increase activity slowly as tolerated    Complete by:  As directed    Lifting restrictions    Complete by:  As directed    No lifting for 6 weeks   TED hose    Complete by:  As directed    Use stockings (TED hose)  for 2 weeks on both leg(s).  You may remove them at night for sleeping.      Follow-up Information    Fredrick Dray, Arlys John, MD. Schedule an appointment as soon as possible for a visit in 2 week(s).   Specialty:  Orthopedic Surgery Why:  For wound re-check Contact information: [address] [place] [address] [phone]        Home, [facility] Follow up.   Specialty:  Home Health Services Why:  nurse to check INR, physical therapy Contact information: [address] [place]  [address] [place] [address] [phone]            Signed: Garnet KoyanagiSwinteck, Zane Pellecchia James 07/18/2016, 11:23 AM

## 2016-07-18 NOTE — Progress Notes (Signed)
ANTICOAGULATION CONSULT NOTE  Pharmacy Consult for warfarin Indication: atrial fibrillation and VTE prophylaxis  Allergies  Allergen Reactions  . Prednisone Swelling    Patient Measurements: Height: 5\' 10"  (177.8 cm) Weight: 176 lb (79.8 kg) IBW/kg (Calculated) : 73   Vital Signs: Temp: 97.8 F (36.6 C) (07/06 1018) Temp Source: Oral (07/06 1018) BP: 139/88 (07/06 1018) Pulse Rate: 106 (07/06 1018)  Labs:  Recent Labs  07/17/16 1551 07/18/16 0441  HGB 13.9 11.9*  HCT 42.7 35.4*  PLT 266 279  LABPROT  --  14.5  INR  --  1.12  CREATININE 0.80 0.74    Estimated Creatinine Clearance: 91.3 mL/min (by C-G formula based on SCr of 0.74 mg/dL).   Assessment: 1966 YOM with h/o afib on chronic warfarin therapy.  He is s/p anterior THA 7/5.  Pharmacy asked to dose warfarin while in hospital. Last INR 6/29 = 1.4  Home warfarin regimen: patient reports taking 2.5mg  daily but his last warfarin clinic note says to take 2.5mg  daily with 5mg  Mon/Fri  Today, 07/18/2016  INR 1.12, Hg 13.9>11.9 ABLA.   Goal of Therapy:  INR goal 2.3 - 3 per outpatient records from coumadin clinic  Plan:   Warfarin 5mg  PO x 1 tonight  Daily INR  Enoxaparin to stop when INR >= 1.8  Herby AbrahamMichelle T. Ura Yingling, Pharm.D. [phone] 07/18/2016 10:50 AM

## 2016-07-19 DIAGNOSIS — E785 Hyperlipidemia, unspecified: Secondary | ICD-10-CM | POA: Diagnosis not present

## 2016-07-19 DIAGNOSIS — Z7901 Long term (current) use of anticoagulants: Secondary | ICD-10-CM | POA: Diagnosis not present

## 2016-07-19 DIAGNOSIS — Z96643 Presence of artificial hip joint, bilateral: Secondary | ICD-10-CM | POA: Diagnosis not present

## 2016-07-19 DIAGNOSIS — I10 Essential (primary) hypertension: Secondary | ICD-10-CM | POA: Diagnosis not present

## 2016-07-19 DIAGNOSIS — Z471 Aftercare following joint replacement surgery: Secondary | ICD-10-CM | POA: Diagnosis not present

## 2016-07-19 DIAGNOSIS — Z9181 History of falling: Secondary | ICD-10-CM | POA: Diagnosis not present

## 2016-07-19 DIAGNOSIS — M109 Gout, unspecified: Secondary | ICD-10-CM | POA: Diagnosis not present

## 2016-07-19 DIAGNOSIS — I4891 Unspecified atrial fibrillation: Secondary | ICD-10-CM | POA: Diagnosis not present

## 2016-07-19 DIAGNOSIS — M170 Bilateral primary osteoarthritis of knee: Secondary | ICD-10-CM | POA: Diagnosis not present

## 2016-07-19 DIAGNOSIS — Z5181 Encounter for therapeutic drug level monitoring: Secondary | ICD-10-CM | POA: Diagnosis not present

## 2016-07-21 DIAGNOSIS — M170 Bilateral primary osteoarthritis of knee: Secondary | ICD-10-CM | POA: Diagnosis not present

## 2016-07-21 DIAGNOSIS — Z5181 Encounter for therapeutic drug level monitoring: Secondary | ICD-10-CM | POA: Diagnosis not present

## 2016-07-21 DIAGNOSIS — Z7901 Long term (current) use of anticoagulants: Secondary | ICD-10-CM | POA: Diagnosis not present

## 2016-07-21 DIAGNOSIS — Z96643 Presence of artificial hip joint, bilateral: Secondary | ICD-10-CM | POA: Diagnosis not present

## 2016-07-21 DIAGNOSIS — E785 Hyperlipidemia, unspecified: Secondary | ICD-10-CM | POA: Diagnosis not present

## 2016-07-21 DIAGNOSIS — Z9181 History of falling: Secondary | ICD-10-CM | POA: Diagnosis not present

## 2016-07-21 DIAGNOSIS — Z471 Aftercare following joint replacement surgery: Secondary | ICD-10-CM | POA: Diagnosis not present

## 2016-07-21 DIAGNOSIS — I10 Essential (primary) hypertension: Secondary | ICD-10-CM | POA: Diagnosis not present

## 2016-07-21 DIAGNOSIS — M109 Gout, unspecified: Secondary | ICD-10-CM | POA: Diagnosis not present

## 2016-07-21 DIAGNOSIS — I4891 Unspecified atrial fibrillation: Secondary | ICD-10-CM | POA: Diagnosis not present

## 2016-07-23 DIAGNOSIS — Z9181 History of falling: Secondary | ICD-10-CM | POA: Diagnosis not present

## 2016-07-23 DIAGNOSIS — Z471 Aftercare following joint replacement surgery: Secondary | ICD-10-CM | POA: Diagnosis not present

## 2016-07-23 DIAGNOSIS — Z96643 Presence of artificial hip joint, bilateral: Secondary | ICD-10-CM | POA: Diagnosis not present

## 2016-07-23 DIAGNOSIS — M109 Gout, unspecified: Secondary | ICD-10-CM | POA: Diagnosis not present

## 2016-07-23 DIAGNOSIS — Z5181 Encounter for therapeutic drug level monitoring: Secondary | ICD-10-CM | POA: Diagnosis not present

## 2016-07-23 DIAGNOSIS — Z7901 Long term (current) use of anticoagulants: Secondary | ICD-10-CM | POA: Diagnosis not present

## 2016-07-23 DIAGNOSIS — E785 Hyperlipidemia, unspecified: Secondary | ICD-10-CM | POA: Diagnosis not present

## 2016-07-23 DIAGNOSIS — I10 Essential (primary) hypertension: Secondary | ICD-10-CM | POA: Diagnosis not present

## 2016-07-23 DIAGNOSIS — M170 Bilateral primary osteoarthritis of knee: Secondary | ICD-10-CM | POA: Diagnosis not present

## 2016-07-23 DIAGNOSIS — I4891 Unspecified atrial fibrillation: Secondary | ICD-10-CM | POA: Diagnosis not present

## 2016-07-24 ENCOUNTER — Telehealth: Payer: Self-pay | Admitting: Pharmacist

## 2016-07-24 DIAGNOSIS — I4891 Unspecified atrial fibrillation: Secondary | ICD-10-CM | POA: Diagnosis not present

## 2016-07-24 DIAGNOSIS — M170 Bilateral primary osteoarthritis of knee: Secondary | ICD-10-CM | POA: Diagnosis not present

## 2016-07-24 DIAGNOSIS — E785 Hyperlipidemia, unspecified: Secondary | ICD-10-CM | POA: Diagnosis not present

## 2016-07-24 DIAGNOSIS — I10 Essential (primary) hypertension: Secondary | ICD-10-CM | POA: Diagnosis not present

## 2016-07-24 DIAGNOSIS — Z9181 History of falling: Secondary | ICD-10-CM | POA: Diagnosis not present

## 2016-07-24 DIAGNOSIS — Z96643 Presence of artificial hip joint, bilateral: Secondary | ICD-10-CM | POA: Diagnosis not present

## 2016-07-24 DIAGNOSIS — Z7901 Long term (current) use of anticoagulants: Secondary | ICD-10-CM | POA: Diagnosis not present

## 2016-07-24 DIAGNOSIS — M109 Gout, unspecified: Secondary | ICD-10-CM | POA: Diagnosis not present

## 2016-07-24 DIAGNOSIS — Z471 Aftercare following joint replacement surgery: Secondary | ICD-10-CM | POA: Diagnosis not present

## 2016-07-24 DIAGNOSIS — Z5181 Encounter for therapeutic drug level monitoring: Secondary | ICD-10-CM | POA: Diagnosis not present

## 2016-07-24 NOTE — Telephone Encounter (Signed)
INR was checked yeasterday by Home health and INR was 1.8.  SInce discharge pt has been taking warfarin 5mg  qd.  Dr Veda CanningSwintek has recommended that he continue 5mg  qd.  I recommend that he follow recommendations of Dr Loura HaltSwikteck and recheck INR Monday, July 16th.  Discussed with patient's wife.

## 2016-07-25 ENCOUNTER — Encounter: Payer: Self-pay | Admitting: Pharmacist Clinician (PhC)/ Clinical Pharmacy Specialist

## 2016-07-25 DIAGNOSIS — M109 Gout, unspecified: Secondary | ICD-10-CM | POA: Diagnosis not present

## 2016-07-25 DIAGNOSIS — Z96643 Presence of artificial hip joint, bilateral: Secondary | ICD-10-CM | POA: Diagnosis not present

## 2016-07-25 DIAGNOSIS — Z7901 Long term (current) use of anticoagulants: Secondary | ICD-10-CM | POA: Diagnosis not present

## 2016-07-25 DIAGNOSIS — Z9181 History of falling: Secondary | ICD-10-CM | POA: Diagnosis not present

## 2016-07-25 DIAGNOSIS — E785 Hyperlipidemia, unspecified: Secondary | ICD-10-CM | POA: Diagnosis not present

## 2016-07-25 DIAGNOSIS — Z471 Aftercare following joint replacement surgery: Secondary | ICD-10-CM | POA: Diagnosis not present

## 2016-07-25 DIAGNOSIS — I4891 Unspecified atrial fibrillation: Secondary | ICD-10-CM | POA: Diagnosis not present

## 2016-07-25 DIAGNOSIS — I10 Essential (primary) hypertension: Secondary | ICD-10-CM | POA: Diagnosis not present

## 2016-07-25 DIAGNOSIS — Z5181 Encounter for therapeutic drug level monitoring: Secondary | ICD-10-CM | POA: Diagnosis not present

## 2016-07-25 DIAGNOSIS — M170 Bilateral primary osteoarthritis of knee: Secondary | ICD-10-CM | POA: Diagnosis not present

## 2016-07-28 DIAGNOSIS — Z9181 History of falling: Secondary | ICD-10-CM | POA: Diagnosis not present

## 2016-07-28 DIAGNOSIS — E785 Hyperlipidemia, unspecified: Secondary | ICD-10-CM | POA: Diagnosis not present

## 2016-07-28 DIAGNOSIS — Z96643 Presence of artificial hip joint, bilateral: Secondary | ICD-10-CM | POA: Diagnosis not present

## 2016-07-28 DIAGNOSIS — Z7901 Long term (current) use of anticoagulants: Secondary | ICD-10-CM | POA: Diagnosis not present

## 2016-07-28 DIAGNOSIS — I10 Essential (primary) hypertension: Secondary | ICD-10-CM | POA: Diagnosis not present

## 2016-07-28 DIAGNOSIS — I4891 Unspecified atrial fibrillation: Secondary | ICD-10-CM | POA: Diagnosis not present

## 2016-07-28 DIAGNOSIS — Z471 Aftercare following joint replacement surgery: Secondary | ICD-10-CM | POA: Diagnosis not present

## 2016-07-28 DIAGNOSIS — M170 Bilateral primary osteoarthritis of knee: Secondary | ICD-10-CM | POA: Diagnosis not present

## 2016-07-28 DIAGNOSIS — M109 Gout, unspecified: Secondary | ICD-10-CM | POA: Diagnosis not present

## 2016-07-28 DIAGNOSIS — Z5181 Encounter for therapeutic drug level monitoring: Secondary | ICD-10-CM | POA: Diagnosis not present

## 2016-07-29 DIAGNOSIS — M170 Bilateral primary osteoarthritis of knee: Secondary | ICD-10-CM | POA: Diagnosis not present

## 2016-07-29 DIAGNOSIS — Z96643 Presence of artificial hip joint, bilateral: Secondary | ICD-10-CM | POA: Diagnosis not present

## 2016-07-29 DIAGNOSIS — Z5181 Encounter for therapeutic drug level monitoring: Secondary | ICD-10-CM | POA: Diagnosis not present

## 2016-07-29 DIAGNOSIS — I10 Essential (primary) hypertension: Secondary | ICD-10-CM | POA: Diagnosis not present

## 2016-07-29 DIAGNOSIS — I4891 Unspecified atrial fibrillation: Secondary | ICD-10-CM | POA: Diagnosis not present

## 2016-07-29 DIAGNOSIS — Z7901 Long term (current) use of anticoagulants: Secondary | ICD-10-CM | POA: Diagnosis not present

## 2016-07-29 DIAGNOSIS — Z9181 History of falling: Secondary | ICD-10-CM | POA: Diagnosis not present

## 2016-07-29 DIAGNOSIS — Z471 Aftercare following joint replacement surgery: Secondary | ICD-10-CM | POA: Diagnosis not present

## 2016-07-29 DIAGNOSIS — E785 Hyperlipidemia, unspecified: Secondary | ICD-10-CM | POA: Diagnosis not present

## 2016-07-29 DIAGNOSIS — M109 Gout, unspecified: Secondary | ICD-10-CM | POA: Diagnosis not present

## 2016-07-30 DIAGNOSIS — Z96643 Presence of artificial hip joint, bilateral: Secondary | ICD-10-CM | POA: Diagnosis not present

## 2016-07-30 DIAGNOSIS — Z7901 Long term (current) use of anticoagulants: Secondary | ICD-10-CM | POA: Diagnosis not present

## 2016-07-30 DIAGNOSIS — Z9181 History of falling: Secondary | ICD-10-CM | POA: Diagnosis not present

## 2016-07-30 DIAGNOSIS — Z5181 Encounter for therapeutic drug level monitoring: Secondary | ICD-10-CM | POA: Diagnosis not present

## 2016-07-30 DIAGNOSIS — I10 Essential (primary) hypertension: Secondary | ICD-10-CM | POA: Diagnosis not present

## 2016-07-30 DIAGNOSIS — E785 Hyperlipidemia, unspecified: Secondary | ICD-10-CM | POA: Diagnosis not present

## 2016-07-30 DIAGNOSIS — M170 Bilateral primary osteoarthritis of knee: Secondary | ICD-10-CM | POA: Diagnosis not present

## 2016-07-30 DIAGNOSIS — M109 Gout, unspecified: Secondary | ICD-10-CM | POA: Diagnosis not present

## 2016-07-30 DIAGNOSIS — Z471 Aftercare following joint replacement surgery: Secondary | ICD-10-CM | POA: Diagnosis not present

## 2016-07-30 DIAGNOSIS — I4891 Unspecified atrial fibrillation: Secondary | ICD-10-CM | POA: Diagnosis not present

## 2016-07-31 DIAGNOSIS — Z471 Aftercare following joint replacement surgery: Secondary | ICD-10-CM | POA: Diagnosis not present

## 2016-07-31 DIAGNOSIS — Z5181 Encounter for therapeutic drug level monitoring: Secondary | ICD-10-CM | POA: Diagnosis not present

## 2016-07-31 DIAGNOSIS — M109 Gout, unspecified: Secondary | ICD-10-CM | POA: Diagnosis not present

## 2016-07-31 DIAGNOSIS — Z7901 Long term (current) use of anticoagulants: Secondary | ICD-10-CM | POA: Diagnosis not present

## 2016-07-31 DIAGNOSIS — M170 Bilateral primary osteoarthritis of knee: Secondary | ICD-10-CM | POA: Diagnosis not present

## 2016-07-31 DIAGNOSIS — E785 Hyperlipidemia, unspecified: Secondary | ICD-10-CM | POA: Diagnosis not present

## 2016-07-31 DIAGNOSIS — Z9181 History of falling: Secondary | ICD-10-CM | POA: Diagnosis not present

## 2016-07-31 DIAGNOSIS — Z96643 Presence of artificial hip joint, bilateral: Secondary | ICD-10-CM | POA: Diagnosis not present

## 2016-07-31 DIAGNOSIS — I4891 Unspecified atrial fibrillation: Secondary | ICD-10-CM | POA: Diagnosis not present

## 2016-07-31 DIAGNOSIS — I10 Essential (primary) hypertension: Secondary | ICD-10-CM | POA: Diagnosis not present

## 2016-08-01 ENCOUNTER — Telehealth: Payer: Self-pay | Admitting: Family

## 2016-08-01 DIAGNOSIS — I10 Essential (primary) hypertension: Secondary | ICD-10-CM | POA: Diagnosis not present

## 2016-08-01 DIAGNOSIS — Z5181 Encounter for therapeutic drug level monitoring: Secondary | ICD-10-CM | POA: Diagnosis not present

## 2016-08-01 DIAGNOSIS — E785 Hyperlipidemia, unspecified: Secondary | ICD-10-CM | POA: Diagnosis not present

## 2016-08-01 DIAGNOSIS — M109 Gout, unspecified: Secondary | ICD-10-CM | POA: Diagnosis not present

## 2016-08-01 DIAGNOSIS — Z9181 History of falling: Secondary | ICD-10-CM | POA: Diagnosis not present

## 2016-08-01 DIAGNOSIS — I4891 Unspecified atrial fibrillation: Secondary | ICD-10-CM | POA: Diagnosis not present

## 2016-08-01 DIAGNOSIS — Z96641 Presence of right artificial hip joint: Secondary | ICD-10-CM | POA: Diagnosis not present

## 2016-08-01 DIAGNOSIS — Z96643 Presence of artificial hip joint, bilateral: Secondary | ICD-10-CM | POA: Diagnosis not present

## 2016-08-01 DIAGNOSIS — Z471 Aftercare following joint replacement surgery: Secondary | ICD-10-CM | POA: Diagnosis not present

## 2016-08-01 DIAGNOSIS — Z7901 Long term (current) use of anticoagulants: Secondary | ICD-10-CM | POA: Diagnosis not present

## 2016-08-01 DIAGNOSIS — M170 Bilateral primary osteoarthritis of knee: Secondary | ICD-10-CM | POA: Diagnosis not present

## 2016-08-01 NOTE — Telephone Encounter (Signed)
When should pt come in to have his INR checked?

## 2016-08-04 NOTE — Telephone Encounter (Signed)
Apt made for Thursday.  

## 2016-08-04 NOTE — Telephone Encounter (Signed)
Pt will need to schedule appt with Tammy this week or first of next week. Did they adjust his warfarin dose?

## 2016-08-06 DIAGNOSIS — Z96643 Presence of artificial hip joint, bilateral: Secondary | ICD-10-CM | POA: Diagnosis not present

## 2016-08-06 DIAGNOSIS — M170 Bilateral primary osteoarthritis of knee: Secondary | ICD-10-CM | POA: Diagnosis not present

## 2016-08-06 DIAGNOSIS — Z9181 History of falling: Secondary | ICD-10-CM | POA: Diagnosis not present

## 2016-08-06 DIAGNOSIS — Z5181 Encounter for therapeutic drug level monitoring: Secondary | ICD-10-CM | POA: Diagnosis not present

## 2016-08-06 DIAGNOSIS — E785 Hyperlipidemia, unspecified: Secondary | ICD-10-CM | POA: Diagnosis not present

## 2016-08-06 DIAGNOSIS — M109 Gout, unspecified: Secondary | ICD-10-CM | POA: Diagnosis not present

## 2016-08-06 DIAGNOSIS — Z7901 Long term (current) use of anticoagulants: Secondary | ICD-10-CM | POA: Diagnosis not present

## 2016-08-06 DIAGNOSIS — Z471 Aftercare following joint replacement surgery: Secondary | ICD-10-CM | POA: Diagnosis not present

## 2016-08-06 DIAGNOSIS — I10 Essential (primary) hypertension: Secondary | ICD-10-CM | POA: Diagnosis not present

## 2016-08-06 DIAGNOSIS — I4891 Unspecified atrial fibrillation: Secondary | ICD-10-CM | POA: Diagnosis not present

## 2016-08-07 ENCOUNTER — Ambulatory Visit (INDEPENDENT_AMBULATORY_CARE_PROVIDER_SITE_OTHER): Payer: Medicare Other | Admitting: Family

## 2016-08-07 ENCOUNTER — Encounter: Payer: Self-pay | Admitting: Family

## 2016-08-07 VITALS — BP 114/78 | HR 52 | Temp 97.2°F | Ht 70.0 in | Wt 175.8 lb

## 2016-08-07 DIAGNOSIS — Z7901 Long term (current) use of anticoagulants: Secondary | ICD-10-CM | POA: Insufficient documentation

## 2016-08-07 DIAGNOSIS — F17200 Nicotine dependence, unspecified, uncomplicated: Secondary | ICD-10-CM | POA: Diagnosis not present

## 2016-08-07 DIAGNOSIS — R42 Dizziness and giddiness: Secondary | ICD-10-CM | POA: Diagnosis not present

## 2016-08-07 DIAGNOSIS — I4891 Unspecified atrial fibrillation: Secondary | ICD-10-CM | POA: Diagnosis not present

## 2016-08-07 MED ORDER — CLONAZEPAM 0.5 MG PO TABS: 0.5000 mg | ORAL_TABLET | Freq: Two times a day (BID) | ORAL | 5 refills | Status: DC | PRN

## 2016-08-07 MED ORDER — MECLIZINE HCL 25 MG PO TABS: 25.0000 mg | ORAL_TABLET | Freq: Three times a day (TID) | ORAL | 1 refills | Status: DC | PRN

## 2016-08-07 NOTE — Progress Notes (Signed)
   Subjective:    Patient ID: Jacob Sanders, male    DOB: May 15, 1945, 71 y.o.   MRN: [id number]  HPI Pt presents to the office today for anticoagulation. Pt currently taking warfarin 5 mg daily. Pt's last INR from Home Health  1.7. Pt's goal INR is 2.3-3.   PT denies any missed doses, bruising, medications changes. Pt states he has been dizzy over the last few weeks. Pt states he has not fallen.    Review of Systems  Musculoskeletal: Positive for arthralgias.  All other systems reviewed and are negative.      Objective:   Physical Exam  Constitutional: He is oriented to person, place, and time. He appears well-developed and well-nourished. No distress.  HENT:  Head: Normocephalic.  Cardiovascular: Normal rate, regular rhythm, normal heart sounds and intact distal pulses.   No murmur heard. Pulmonary/Chest: Effort normal and breath sounds normal. No respiratory distress. He has no wheezes.  Abdominal: Soft. Bowel sounds are normal. He exhibits no distension. There is no tenderness.  Musculoskeletal: Normal range of motion. He exhibits no edema or tenderness.  Neurological: He is alert and oriented to person, place, and time.  Skin: Skin is warm and dry. No rash noted. No erythema.  Psychiatric: He has a normal mood and affect. His behavior is normal. Judgment and thought content normal.  Vitals reviewed.    BP 114/78   Pulse (!) 52   Temp (!) 97.2 F (36.2 C) (Oral)   Ht 5\' 10"  (1.778 m)   Wt 175 lb 12.8 oz (79.7 kg)   BMI 25.22 kg/m      Assessment & Plan:  1. Atrial fibrillation, unspecified type [facility](HCC) Anticoagulation Warfarin Dose Instructions as of 08/07/2016      Glynis SmilesSun Mon Tue Wed Thu Fri Sat   New Dose 2.5 mg 5 mg 2.5 mg 2.5 mg 5 mg 2.5 mg 2.5 mg    Description   Increase your warfarin to taking 1 tablet on Mondays and Thursday and 1/2 tablet all other days of the week.  INR today is 1.7 (goal is 2-3).  Stop warfarin 5-6 days prior to hip replacement surgery.   Re-start warfarin as surgeon instructs   -Keep follow up with Cardiologists  - CoaguChek XS/INR Waived  2. Current smoker Smoking cessation discussed  3. Anticoagulated on warfarin   4. Dizziness [place] precaution discussed  - meclizine (ANTIVERT) 25 MG tablet; Take 1 tablet (25 mg total) by mouth 3 (three) times daily as needed for dizziness.  Dispense: 60 tablet; Refill: 1 - CBC with Differential/Platelet    Jannifer Rodneyhristy Soundra Lampley, FNP

## 2016-08-07 NOTE — Patient Instructions (Signed)
Dizziness Dizziness is a common problem. It is a feeling of unsteadiness or light-headedness. You may feel like you are about to faint. Dizziness can lead to injury if you stumble or fall. Anyone can become dizzy, but dizziness is more common in older adults. This condition can be caused by a number of things, including medicines, dehydration, or illness. Follow these instructions at home: Taking these steps may help with your condition: Eating and drinking   Drink enough fluid to keep your urine clear or pale yellow. This helps to keep you from becoming dehydrated. Try to drink more clear fluids, such as water.  Do not drink alcohol.  Limit your caffeine intake if directed by your health care provider.  Limit your salt intake if directed by your health care provider. Activity   Avoid making quick movements.  Rise slowly from chairs and steady yourself until you feel okay.  In the morning, first sit up on the side of the bed. When you feel okay, stand slowly while you hold onto something until you know that your balance is fine.  Move your legs often if you need to stand in one place for a long time. Tighten and relax your muscles in your legs while you are standing.  Do not drive or operate heavy machinery if you feel dizzy.  Avoid bending down if you feel dizzy. Place items in your home so that they are easy for you to reach without leaning over. Lifestyle   Do not use any tobacco products, including cigarettes, chewing tobacco, or electronic cigarettes. If you need help quitting, ask your health care provider.  Try to reduce your stress level, such as with yoga or meditation. Talk with your health care provider if you need help. General instructions   Watch your dizziness for any changes.  Take medicines only as directed by your health care provider. Talk with your health care provider if you think that your dizziness is caused by a medicine that you are taking.  Tell a friend  or a family member that you are feeling dizzy. If he or she notices any changes in your behavior, have this person call your health care provider.  Keep all follow-up visits as directed by your health care provider. This is important. Contact a health care provider if:  Your dizziness does not go away.  Your dizziness or light-headedness gets worse.  You feel nauseous.  You have reduced hearing.  You have new symptoms.  You are unsteady on your feet or you feel like the room is spinning. Get help right away if:  You vomit or have diarrhea and are unable to eat or drink anything.  You have problems talking, walking, swallowing, or using your arms, hands, or legs.  You feel generally weak.  You are not thinking clearly or you have trouble forming sentences. It may take a friend or family member to notice this.  You have chest pain, abdominal pain, shortness of breath, or sweating.  Your vision changes.  You notice any bleeding.  You have a headache.  You have neck pain or a stiff neck.  You have a fever. This information is not intended to replace advice given to you by your health care provider. Make sure you discuss any questions you have with your health care provider. Document Released: 06/25/2000 Document Revised: 06/07/2015 Document Reviewed: 12/26/2013 Elsevier Interactive Patient Education  2017 [employer]vier Inc.  

## 2016-08-08 LAB — CBC WITH DIFFERENTIAL/PLATELET
BASOS: 0 %
Basophils Absolute: 0 10*3/uL (ref 0.0–0.2)
EOS (ABSOLUTE): 0.1 10*3/uL (ref 0.0–0.4)
EOS: 1 %
HEMATOCRIT: 39.6 % (ref 37.5–51.0)
Hemoglobin: 12.7 g/dL — ABNORMAL LOW (ref 13.0–17.7)
IMMATURE GRANS (ABS): 0 10*3/uL (ref 0.0–0.1)
IMMATURE GRANULOCYTES: 0 %
LYMPHS: 28 %
Lymphocytes Absolute: 2.5 10*3/uL (ref 0.7–3.1)
MCH: 28.7 pg (ref 26.6–33.0)
MCHC: 32.1 g/dL (ref 31.5–35.7)
MCV: 89 fL (ref 79–97)
MONOS ABS: 0.7 10*3/uL (ref 0.1–0.9)
Monocytes: 8 %
NEUTROS PCT: 63 %
Neutrophils Absolute: 5.6 10*3/uL (ref 1.4–7.0)
Platelets: 429 10*3/uL — ABNORMAL HIGH (ref 150–379)
RBC: 4.43 x10E6/uL (ref 4.14–5.80)
RDW: 14.7 % (ref 12.3–15.4)
WBC: 9 10*3/uL (ref 3.4–10.8)

## 2016-08-08 LAB — COAGUCHEK XS/INR WAIVED
INR: 1.7 — ABNORMAL HIGH (ref 0.9–1.1)
Prothrombin Time: 20.7 s

## 2016-08-15 ENCOUNTER — Ambulatory Visit (INDEPENDENT_AMBULATORY_CARE_PROVIDER_SITE_OTHER): Payer: Medicare Other | Admitting: Pharmacist Clinician (PhC)/ Clinical Pharmacy Specialist

## 2016-08-15 DIAGNOSIS — I4891 Unspecified atrial fibrillation: Secondary | ICD-10-CM | POA: Diagnosis not present

## 2016-08-15 LAB — COAGUCHEK XS/INR WAIVED
INR: 1.7 — AB (ref 0.9–1.1)
PROTHROMBIN TIME: 19.9 s

## 2016-08-15 NOTE — Patient Instructions (Signed)
Anticoagulation Warfarin Dose Instructions as of 08/15/2016      Jacob SmilesSun Mon Tue Wed Thu Fri Sat   New Dose 5 mg 5 mg 2.5 mg 5 mg 2.5 mg 5 mg 2.5 mg    Description   Change to taking 1 tablet every day except for Tuesdays, Thursdays, and Saturdays take 1/2 tablet.  INR today is 1.7 (goal is 2-3)  A little thick today

## 2016-08-21 ENCOUNTER — Encounter: Payer: Self-pay | Admitting: Family

## 2016-08-21 ENCOUNTER — Ambulatory Visit (INDEPENDENT_AMBULATORY_CARE_PROVIDER_SITE_OTHER): Payer: Medicare Other | Admitting: Family

## 2016-08-21 VITALS — BP 115/79 | HR 113 | Temp 97.3°F | Ht 70.0 in | Wt 177.8 lb

## 2016-08-21 DIAGNOSIS — I4891 Unspecified atrial fibrillation: Secondary | ICD-10-CM

## 2016-08-21 DIAGNOSIS — E785 Hyperlipidemia, unspecified: Secondary | ICD-10-CM

## 2016-08-21 DIAGNOSIS — I10 Essential (primary) hypertension: Secondary | ICD-10-CM

## 2016-08-21 DIAGNOSIS — K219 Gastro-esophageal reflux disease without esophagitis: Secondary | ICD-10-CM | POA: Diagnosis not present

## 2016-08-21 DIAGNOSIS — Z7901 Long term (current) use of anticoagulants: Secondary | ICD-10-CM

## 2016-08-21 DIAGNOSIS — E663 Overweight: Secondary | ICD-10-CM | POA: Diagnosis not present

## 2016-08-21 DIAGNOSIS — K5900 Constipation, unspecified: Secondary | ICD-10-CM | POA: Diagnosis not present

## 2016-08-21 DIAGNOSIS — F17200 Nicotine dependence, unspecified, uncomplicated: Secondary | ICD-10-CM

## 2016-08-21 LAB — CMP14+EGFR
A/G RATIO: 1.5 (ref 1.2–2.2)
ALT: 5 IU/L (ref 0–44)
AST: 13 IU/L (ref 0–40)
Albumin: 4 g/dL (ref 3.6–4.8)
Alkaline Phosphatase: 107 IU/L (ref 39–117)
BUN/Creatinine Ratio: 13 (ref 10–24)
BUN: 12 mg/dL (ref 8–27)
Bilirubin Total: 0.5 mg/dL (ref 0.0–1.2)
CHLORIDE: 101 mmol/L (ref 96–106)
CO2: 25 mmol/L (ref 20–29)
Calcium: 9.2 mg/dL (ref 8.6–10.2)
Creatinine, Ser: 0.9 mg/dL (ref 0.76–1.27)
GFR calc Af Amer: 100 mL/min/{1.73_m2} (ref >59)
GFR calc non Af Amer: 87 mL/min/{1.73_m2} (ref >59)
GLOBULIN, TOTAL: 2.6 g/dL (ref 1.5–4.5)
Glucose: 99 mg/dL (ref 65–99)
POTASSIUM: 3.9 mmol/L (ref 3.5–5.2)
SODIUM: 142 mmol/L (ref 134–144)
Total Protein: 6.6 g/dL (ref 6.0–8.5)

## 2016-08-21 LAB — LIPID PANEL
CHOLESTEROL TOTAL: 215 mg/dL — AB (ref 100–199)
Chol/HDL Ratio: 4.1 ratio (ref 0.0–5.0)
HDL: 53 mg/dL (ref >39)
LDL Calculated: 138 mg/dL — ABNORMAL HIGH (ref 0–99)
TRIGLYCERIDES: 122 mg/dL (ref 0–149)
VLDL Cholesterol Cal: 24 mg/dL (ref 5–40)

## 2016-08-21 LAB — COAGUCHEK XS/INR WAIVED
INR: 2.3 — AB (ref 0.9–1.1)
Prothrombin Time: 27.8 s

## 2016-08-21 NOTE — Progress Notes (Signed)
Subjective:    Patient ID: Jacob Sanders, male    DOB: 19-Feb-1945, 71 y.o.   MRN: [id number]  Pt presents to the office today for chronic follow up.  Gastroesophageal Reflux  He reports no belching, no dysphagia or no heartburn. This is a chronic problem. The current episode started more than 1 year ago. The problem occurs rarely. The problem has been waxing and waning. The symptoms are aggravated by certain foods and smoking. Risk factors include obesity. He has tried a PPI for the symptoms. The treatment provided moderate relief.  Anxiety  Presents for follow-up visit. Symptoms include excessive worry, irritability, nervous/anxious behavior and restlessness. Patient reports no shortness of breath. Symptoms occur occasionally.    Hypertension  This is a chronic problem. The current episode started more than 1 year ago. The problem has been resolved since onset. The problem is controlled. Associated symptoms include anxiety. Pertinent negatives include no malaise/fatigue, peripheral edema or shortness of breath. The current treatment provides moderate improvement.  Constipation  This is a chronic problem. The problem has been resolved since onset. His stool frequency is 1 time per day. He has tried diet changes and stool softeners for the symptoms. The treatment provided moderate relief.  Hyperlipidemia  This is a chronic problem. The current episode started more than 1 year ago. The problem is uncontrolled. Recent lipid tests were reviewed and are high. Factors aggravating his hyperlipidemia include smoking. Pertinent negatives include no shortness of breath. Current antihyperlipidemic treatment includes diet change. The current treatment provides no improvement of lipids.  A Fib PT taking warfarin daily. See anticoagulation.    Review of Systems  Constitutional: Positive for irritability. Negative for malaise/fatigue.  Respiratory: Negative for shortness of breath.   Gastrointestinal:  Positive for constipation. Negative for dysphagia and heartburn.  Psychiatric/Behavioral: The patient is nervous/anxious.   All other systems reviewed and are negative.      Objective:   Physical Exam  Constitutional: He is oriented to person, place, and time. He appears well-developed and well-nourished. No distress.  HENT:  Head: Normocephalic.  Right Ear: External ear normal.  Left Ear: External ear normal.  Mouth/Throat: Oropharynx is clear and moist.  Eyes: Pupils are equal, round, and reactive to light. Right eye exhibits no discharge. Left eye exhibits no discharge.  Neck: Normal range of motion. Neck supple. No thyromegaly present.  Cardiovascular: Normal rate, regular rhythm, normal heart sounds and intact distal pulses.   No murmur heard. Pulmonary/Chest: Effort normal. No respiratory distress. He has decreased breath sounds. He has no wheezes.  Abdominal: Soft. Bowel sounds are normal. He exhibits no distension. There is no tenderness.  Musculoskeletal: Normal range of motion. He exhibits no edema or tenderness.  Pt walking with cane, but full ROM  Neurological: He is alert and oriented to person, place, and time.  Skin: Skin is warm and dry. No rash noted. No erythema.  Psychiatric: He has a normal mood and affect. His behavior is normal. Judgment and thought content normal.  Vitals reviewed.     BP 115/79   Pulse (!) 113   Temp (!) 97.3 F (36.3 C) (Oral)   Ht 5' 10"  (1.778 m)   Wt 177 lb 12.8 oz (80.6 kg)   BMI 25.51 kg/m      Assessment & Plan:  1. Atrial fibrillation, unspecified type ([place]) - CoaguChek XS/INR Waived - CMP14+EGFR  2. Essential hypertension, benign - CMP14+EGFR  3. Gastroesophageal reflux disease, esophagitis presence not specified - CMP14+EGFR  4. Overweight (BMI 25.0-29.9) - CMP14+EGFR  5. Current smoker Smoking cessation discussed - CMP14+EGFR  6. Anticoagulated on warfarin Anticoagulation Warfarin Dose Instructions as of  08/21/2016      Dorene Grebe Tue Wed Thu Fri Sat   New Dose 5 mg 5 mg 2.5 mg 5 mg 2.5 mg 5 mg 2.5 mg    Description   Continue to take 1 tablet every day except for Tuesdays, Thursdays, and Saturdays take 1/2 tablet.  INR today is 2.3 (goal is 2-3)  Perfect!!    - CMP14+EGFR  7. Hyperlipidemia, unspecified hyperlipidemia type - CMP14+EGFR - Lipid panel  8. Constipation, unspecified constipation type - CMP14+EGFR   Continue all meds Labs pending Health Maintenance reviewed Diet and exercise encouraged RTO 6 months and follow up with Clinical Pharm for INR  Evelina Dun, FNP

## 2016-08-21 NOTE — Patient Instructions (Addendum)
Anticoagulation Warfarin Dose Instructions as of 08/21/2016      Glynis SmilesSun Mon Tue Wed Thu Fri Sat   New Dose 5 mg 5 mg 2.5 mg 5 mg 2.5 mg 5 mg 2.5 mg    Description   Continue to take 1 tablet every day except for Tuesdays, Thursdays, and Saturdays take 1/2 tablet.  INR today is 2.3 (goal is 2-3)  Perfect!!

## 2016-08-22 ENCOUNTER — Other Ambulatory Visit: Payer: Self-pay | Admitting: Family

## 2016-08-22 MED ORDER — PRAVASTATIN SODIUM 20 MG PO TABS: 20.0000 mg | ORAL_TABLET | Freq: Every day | ORAL | 3 refills | Status: DC

## 2016-08-29 ENCOUNTER — Ambulatory Visit: Payer: Medicare Other | Admitting: Family

## 2016-09-02 DIAGNOSIS — Z471 Aftercare following joint replacement surgery: Secondary | ICD-10-CM | POA: Diagnosis not present

## 2016-09-02 DIAGNOSIS — Z96641 Presence of right artificial hip joint: Secondary | ICD-10-CM | POA: Diagnosis not present

## 2016-09-04 NOTE — Addendum Note (Signed)
Addendum  created 09/04/16 1420 by Llewellyn Choplin, MD   Sign clinical note    

## 2016-09-09 ENCOUNTER — Encounter: Payer: Self-pay | Admitting: Cardiovascular Disease

## 2016-09-09 ENCOUNTER — Ambulatory Visit (INDEPENDENT_AMBULATORY_CARE_PROVIDER_SITE_OTHER): Payer: Medicare Other | Admitting: Cardiovascular Disease

## 2016-09-09 VITALS — BP 146/96 | HR 84 | Ht 70.0 in | Wt 182.0 lb

## 2016-09-09 DIAGNOSIS — Z720 Tobacco use: Secondary | ICD-10-CM | POA: Diagnosis not present

## 2016-09-09 DIAGNOSIS — I10 Essential (primary) hypertension: Secondary | ICD-10-CM

## 2016-09-09 DIAGNOSIS — I482 Chronic atrial fibrillation, unspecified: Secondary | ICD-10-CM

## 2016-09-09 NOTE — Patient Instructions (Signed)
Medication Instructions: Your physician recommends that you continue on your current medications as directed. Please refer to the Current Medication list given to you today.   Follow-Up: Your physician wants you to follow-up in: 6 months with Dr. Arida. You will receive a reminder letter in the mail two months in advance. If you don't receive a letter, please call our office to schedule the follow-up appointment.  If you need a refill on your cardiac medications before your next appointment, please call your pharmacy.  

## 2016-09-09 NOTE — Progress Notes (Signed)
Cardiology Office Note   Date:  09/09/2016   ID:  Jacob Sanders, DOB Dec 22, 1945, MRN [id number]  PCP:  Junie Spencer, FNP  Cardiologist:   Lorine Bears, MD   Chief Complaint  Patient presents with  . Follow-up      History of Present Illness: Jacob Sanders is a 71 y.o. male who is here today for a follow-up visit regarding chronic atrial fibrillation.  He has known history of essential hypertension and prolonged history of tobacco use.  He had ABI done late 2017 for leg pain which was normal with no evidence of PAD.  He underwent cardiac workup early this year due to shortness of breath and abnormal EKG. Nuclear stress test showed no evidence of ischemia. Echocardiogram showed normal LV systolic function mildly dilated left atrium. The patient underwent bilateral hip replacement recently with significant improvement in hip pain and leg pain. Anticoagulation with warfarin was resumed. He denies any chest pain or shortness of breath. No palpitations or dizziness. He continues to smoke half a pack per day.  Past Medical History:  Diagnosis Date  . Arthritis   . GERD (gastroesophageal reflux disease)   . Hyperlipidemia   . Hypertension     Past Surgical History:  Procedure Laterality Date  . SPINE SURGERY     Resulting staph infection  . TONSILLECTOMY     childhood   . TOOTH EXTRACTION  3 weeks ago ;   had abcess beneath tooth; experiened some bleeding for 20 hours post op  . TOTAL HIP ARTHROPLASTY Left 05/22/2016   Procedure: LEFT TOTAL HIP ARTHROPLASTY ANTERIOR APPROACH;  Surgeon: Samson Frederic, MD;  Location: [facility];  Service: Orthopedics;  Laterality: Left;  Dr. requesting RNFA  . TOTAL HIP ARTHROPLASTY Right 07/17/2016   Procedure: RIGHT TOTAL HIP ARTHROPLASTY ANTERIOR APPROACH;  Surgeon: Samson Frederic, MD;  Location: [facility];  Service: Orthopedics;  Laterality: Right;  needs RNFA     Current Outpatient Prescriptions  Medication Sig Dispense Refill  .  clonazePAM (KLONOPIN) 0.5 MG tablet Take 1 tablet (0.5 mg total) by mouth 2 (two) times daily as needed for anxiety. 30 tablet 5  . diltiazem (CARDIZEM LA) 240 MG 24 hr tablet Take 1 tablet (240 mg total) by mouth daily. 90 tablet 3  . docusate sodium (COLACE) 100 MG capsule Take 1 capsule (100 mg total) by mouth 2 (two) times daily. 60 capsule 1  . meclizine (ANTIVERT) 25 MG tablet Take 1 tablet (25 mg total) by mouth 3 (three) times daily as needed for dizziness. 60 tablet 1  . omeprazole (PRILOSEC) 20 MG capsule TAKE 1 CAPSULE BY MOUTH  DAILY AS NEEDED (Patient taking differently: TAKE 1 CAPSULE BY MOUTH  DAILY AS NEEDED FOR ACID REFLUX.) 90 capsule 1  . warfarin (COUMADIN) 5 MG tablet Take 1 tablet (5 mg total) by mouth daily at 6 PM. (Patient taking differently: Take 2.5 mg by mouth daily at 6 PM. ) 60 tablet 0  . pravastatin (PRAVACHOL) 20 MG tablet Take 1 tablet (20 mg total) by mouth daily. (Patient not taking: Reported on 09/09/2016) 90 tablet 3   No current facility-administered medications for this visit.     Allergies:   Prednisone    Social History:  The patient  reports that he has been smoking Cigars.  He has a 30.00 pack-year smoking history. He has never used smokeless tobacco. He reports that he does not drink alcohol or use drugs.   Family History:  The  patient's family history includes Aneurysm in his father.    ROS:  Please see the history of present illness.   Otherwise, review of systems are positive for none.   All other systems are reviewed and negative.    PHYSICAL EXAM: VS:  BP (!) 146/96   Pulse 84   Ht 5\' 10"  (1.778 m)   Wt 182 lb (82.6 kg)   BMI 26.11 kg/m  , BMI Body mass index is 26.11 kg/m. GEN: Well nourished, well developed, in no acute distress  HEENT: normal  Neck: no JVD, carotid bruits, or masses Cardiac: RRR; no murmurs, rubs, or gallops,no edema  Respiratory:  clear to auscultation bilaterally, normal work of breathing GI: soft, nontender,  nondistended, + BS MS: no deformity or atrophy  Skin: warm and dry, no rash Neuro:  Strength and sensation are intact Psych: euthymic mood, full affect  EKG:  EKG is not ordered today.   Recent Labs: 08/07/2016: Hemoglobin 12.7; Platelets 429 08/21/2016: ALT 5; BUN 12; Creatinine, Ser 0.90; Potassium 3.9; Sodium 142    Lipid Panel    Component Value Date/Time   CHOL 215 (H) 08/21/2016 1211   TRIG 122 08/21/2016 1211   HDL 53 08/21/2016 1211   CHOLHDL 4.1 08/21/2016 1211   LDLCALC 138 (H) 08/21/2016 1211      Wt Readings from Last 3 Encounters:  09/09/16 182 lb (82.6 kg)  08/21/16 177 lb 12.8 oz (80.6 kg)  08/07/16 175 lb 12.8 oz (79.7 kg)      PAD Screen 12/18/2015  Previous PAD dx? No  Previous surgical procedure? No  Pain with walking? Yes  Subsides with rest? No  Feet/toe relief with dangling? No  Painful, non-healing ulcers? No  Extremities discolored? No      ASSESSMENT AND PLAN:   1.  Chronic atrial fibrillation: Ventricular rate is reasonably controlled on diltiazem. CHADS VASc score is 2 due to age and hypertension.  Continue long-term anticoagulation with warfarin with a target INR between 2 and 3. This is being managed by his primary care physician. He is not able to afford a NOAC.   2. Essential hypertension: Blood pressure is mildly elevated but more controlled at home. Continue to monitor.   3. Tobacco use: I again Discussed the importance of smoking cessation.  Disposition:   FU with me in 6 months.   Signed,  Lorine Bears, MD  09/09/2016 11:02 AM    [facility]

## 2016-09-19 ENCOUNTER — Other Ambulatory Visit: Payer: Self-pay | Admitting: Family

## 2016-09-19 DIAGNOSIS — R42 Dizziness and giddiness: Secondary | ICD-10-CM

## 2016-09-19 DIAGNOSIS — I4891 Unspecified atrial fibrillation: Secondary | ICD-10-CM

## 2016-09-26 ENCOUNTER — Ambulatory Visit (INDEPENDENT_AMBULATORY_CARE_PROVIDER_SITE_OTHER): Payer: Medicare Other | Admitting: Pharmacist Clinician (PhC)/ Clinical Pharmacy Specialist

## 2016-09-26 DIAGNOSIS — I4891 Unspecified atrial fibrillation: Secondary | ICD-10-CM

## 2016-09-26 LAB — COAGUCHEK XS/INR WAIVED
INR: 2.6 — ABNORMAL HIGH (ref 0.9–1.1)
Prothrombin Time: 31.3 s

## 2016-09-26 NOTE — Patient Instructions (Signed)
Anticoagulation Warfarin Dose Instructions as of 09/26/2016      Jacob Sanders Tue Wed Thu Fri Sat   New Dose 5 mg 5 mg 2.5 mg 5 mg 2.5 mg 5 mg 2.5 mg    Description   Continue to take 1 tablet every day except for Tuesdays, Thursdays, and Saturdays take 1/2 tablet.  INR today is 2.6 (goal is 2-3)  Perfect!!

## 2016-10-24 ENCOUNTER — Ambulatory Visit (INDEPENDENT_AMBULATORY_CARE_PROVIDER_SITE_OTHER): Payer: Medicare Other | Admitting: Family Medicine

## 2016-10-24 ENCOUNTER — Encounter: Payer: Self-pay | Admitting: Family Medicine

## 2016-10-24 VITALS — BP 131/87 | HR 88 | Temp 97.4°F | Ht 70.0 in | Wt 190.6 lb

## 2016-10-24 DIAGNOSIS — L570 Actinic keratosis: Secondary | ICD-10-CM

## 2016-10-24 DIAGNOSIS — L821 Other seborrheic keratosis: Secondary | ICD-10-CM

## 2016-10-24 NOTE — Progress Notes (Signed)
   HPI  Patient presents today here for mole removal.  Patient states they've been there for quite some time, they're asymptomatic and not changing.  He describes them as large brown stuck on appearing lesions.  He states that 2 of them seem like they're going to fall off soon.   His left arm has a small white lesion that's been there for a few months.  PMH: Smoking status noted ROS: Per HPI  Objective: BP 131/87   Pulse 88   Temp (!) 97.4 F (36.3 C) (Oral)   Ht  (1.778 m)   Wt 190 lb 9.6 oz (86.5 kg)   BMI 27.35 kg/m  Gen: NAD, alert, cooperative with exam HEENT: NCAT CV: Irregularly irregular, no murmur appreciated Resp: CTABL, no wheezes, non-labored Ext: No edema, warm Neuro: Alert and oriented, No gross deficits Skin 5 -10 very large dark brown stuck on appearing lesions on the bilateral back and bilateral sides, multiple smaller similar lesions scattered across the back, approximately  20-40  2 hypopigmented small scaly lesions, 1 on each forearm, L forearnm approx 1 cm across with 2 small hyperkeratotic foci approx 2 mm each, R forearm approx 8 mm across with small hyperkeratotic lesion with 3mm area of hyperkearatosis along the edge  Solar lentigo as well  L shoulder with 3 mm X 3 mm flat brown asymetric lesion, just lateral to a seb ( I believe this is a developing seb, noted b/c of assymetry)  Assessment and plan:  # Seborrheic keratosis Reassurance provided, no need for removal   # AK Treated X 2, 1 on each forearm Dicussed usual healing   Murtis Sink, MD [place] [facility] 10/24/2016, 3:16 PM

## 2016-10-24 NOTE — Patient Instructions (Addendum)
Great to see you!  Come back with any concerns  The large stuck on appearing lesions are called seborrheic keratosis, these are benign.  The small white scaly lesions on her arm are likely actinic keratosis, that is why we have treated with freezing today.

## 2016-11-06 ENCOUNTER — Other Ambulatory Visit: Payer: Self-pay | Admitting: Family

## 2016-11-06 DIAGNOSIS — R42 Dizziness and giddiness: Secondary | ICD-10-CM

## 2016-11-12 ENCOUNTER — Encounter: Payer: Self-pay | Admitting: Family Medicine

## 2016-11-12 ENCOUNTER — Ambulatory Visit (INDEPENDENT_AMBULATORY_CARE_PROVIDER_SITE_OTHER): Payer: Medicare Other | Admitting: Family Medicine

## 2016-11-12 VITALS — BP 133/75 | HR 70 | Temp 97.4°F | Ht 70.0 in | Wt 189.0 lb

## 2016-11-12 DIAGNOSIS — R319 Hematuria, unspecified: Secondary | ICD-10-CM | POA: Diagnosis not present

## 2016-11-12 DIAGNOSIS — N3090 Cystitis, unspecified without hematuria: Secondary | ICD-10-CM | POA: Diagnosis not present

## 2016-11-12 DIAGNOSIS — I4891 Unspecified atrial fibrillation: Secondary | ICD-10-CM | POA: Diagnosis not present

## 2016-11-12 DIAGNOSIS — Z7901 Long term (current) use of anticoagulants: Secondary | ICD-10-CM

## 2016-11-12 LAB — URINALYSIS, MICROSCOPIC ONLY
Epithelial Cells (non renal): NONE SEEN /hpf (ref 0–10)
Renal Epithel, UA: NONE SEEN /hpf
WBC, UA: NONE SEEN /hpf (ref 0–?)

## 2016-11-12 LAB — COAGUCHEK XS/INR WAIVED
INR: 2 — AB (ref 0.9–1.1)
PROTHROMBIN TIME: 24.2 s

## 2016-11-12 MED ORDER — DOXYCYCLINE HYCLATE 100 MG PO CAPS: 100.0000 mg | ORAL_CAPSULE | Freq: Two times a day (BID) | ORAL | 0 refills | Status: DC

## 2016-11-12 MED ORDER — SULFAMETHOXAZOLE-TRIMETHOPRIM 800-160 MG PO TABS: 1.0000 | ORAL_TABLET | Freq: Two times a day (BID) | ORAL | 0 refills | Status: DC

## 2016-11-12 NOTE — Progress Notes (Addendum)
Subjective:  Patient ID: Jacob Sanders, male    DOB: Jul 08, 1945  Age: 71 y.o. MRN: [id number]  CC: Hematuria (pt here today c/o blood in his urine and for the past few days not feeling good)   HPI Jacob Dakinshurman E Rutt presents for First noted that there was blood in his urine this morning. He's had 2 further episodes later in the day. Patient denies fever chills and sweats. No nausea vomiting or diarrhea. No history of prostate or bladder problems in the past. He is very concerned that he is a Coumadin patient due to atrial fibrillation. He bruises easily. Someone told him that if he bruised he must be bleeding internally as well.  [facility] 11/12/2016 10/24/2016 08/21/2016  Decreased Interest 0 0 0  Down, Depressed, Hopeless 0 0 0  PHQ - 2 Score 0 0 0    History Jacob Sanders has a past medical history of Arthritis; GERD (gastroesophageal reflux disease); Hyperlipidemia; and Hypertension.   He has a past surgical history that includes Spine surgery; Tooth extraction (3 weeks ago ;); Tonsillectomy; Total hip arthroplasty (Left, 05/22/2016); and Total hip arthroplasty (Right, 07/17/2016).   His family history includes Aneurysm in his father.He reports that he has been smoking Cigars.  He has a 30.00 pack-year smoking history. He has never used smokeless tobacco. He reports that he does not drink alcohol or use drugs.    ROS Review of Systems  Constitutional: Negative for chills, diaphoresis and fever.  HENT: Negative for rhinorrhea and sore throat.   Respiratory: Negative for cough and shortness of breath.   Cardiovascular: Negative for chest pain.  Gastrointestinal: Negative for abdominal pain.  Musculoskeletal: Negative for arthralgias and myalgias.  Skin: Negative for rash.  Neurological: Negative for weakness and headaches.    Objective:  BP 133/75   Pulse 70   Temp (!) 97.4 F (36.3 C) (Oral)   Ht 5\' 10"  (1.778 m)   Wt 189 lb (85.7 kg)   BMI 27.12 kg/m   BP  Readings from Last 3 Encounters:  11/12/16 133/75  10/24/16 131/87  09/09/16 (!) 146/96    Wt Readings from Last 3 Encounters:  11/12/16 189 lb (85.7 kg)  10/24/16 190 lb 9.6 oz (86.5 kg)  09/09/16 182 lb (82.6 kg)     Physical Exam  Constitutional: He appears well-developed and well-nourished.  HENT:  Head: Normocephalic and atraumatic.  Right Ear: Tympanic membrane and external ear normal. No decreased hearing is noted.  Left Ear: Tympanic membrane and external ear normal. No decreased hearing is noted.  Mouth/Throat: No oropharyngeal exudate or posterior oropharyngeal erythema.  Eyes: Pupils are equal, round, and reactive to light.  Neck: Normal range of motion. Neck supple.  Cardiovascular: Normal rate and regular rhythm.   No murmur heard. Pulmonary/Chest: Breath sounds normal. No respiratory distress.  Abdominal: Soft. Bowel sounds are normal. He exhibits no mass. There is no tenderness.  Vitals reviewed.   INR: 2.0 measured today in clinic  Assessment & Plan:   Jacob Sanders was seen today for hematuria.  Diagnoses and all orders for this visit:  Hematuria, unspecified type -     Urine Culture -     Cancel: Urinalysis, Complete -     Protime-INR -     Urine Microscopic -     CoaguChek XS/INR Waived -     doxycycline (VIBRAMYCIN) 100 MG capsule; Take 1 capsule (100 mg total) by mouth 2 (two) times daily.  Atrial fibrillation, unspecified type (HCC)  Anticoagulated on warfarin  Cystitis  Other orders -     sulfamethoxazole-trimethoprim (BACTRIM DS,SEPTRA DS) 800-160 MG tablet; Take 1 tablet by mouth 2 (two) times daily.    Follow-up 5 days for recheck of ProTime.patient was reassured that internal bleeding was not necessarily occurring based on bruising. In fact there is no evidence that on today's exam at all and his INR is on the low end of therapeutic range which gives him extra protection from that.   I am having Mr. Dettore start on doxycycline and  sulfamethoxazole-trimethoprim. I am also having him maintain his diltiazem, warfarin, clonazePAM, meclizine, and omeprazole.  Allergies as of 11/12/2016      Reactions   Prednisone Swelling      Medication List       Accurate as of 11/12/16  6:01 PM. Always use your most recent med list.          clonazePAM 0.5 MG tablet Commonly known as:  KLONOPIN Take 1 tablet (0.5 mg total) by mouth 2 (two) times daily as needed for anxiety.   diltiazem 240 MG 24 hr tablet Commonly known as:  CARDIZEM LA Take 1 tablet (240 mg total) by mouth daily.   doxycycline 100 MG capsule Commonly known as:  VIBRAMYCIN Take 1 capsule (100 mg total) by mouth 2 (two) times daily.   meclizine 25 MG tablet Commonly known as:  ANTIVERT TAKE 1 TABLET BY MOUTH 3  TIMES DAILY AS NEEDED FOR  DIZZINESS   omeprazole 20 MG capsule Commonly known as:  PRILOSEC TAKE 1 CAPSULE BY MOUTH  DAILY AS NEEDED   sulfamethoxazole-trimethoprim 800-160 MG tablet Commonly known as:  BACTRIM DS,SEPTRA DS Take 1 tablet by mouth 2 (two) times daily.   warfarin 5 MG tablet Commonly known as:  COUMADIN Take 1 tablet (5 mg total) by mouth daily at 6 PM.      Pharmacist called stating that the doxycycline caused $56 and the patient could not afford that asks for cheaper alternative. Therefore Bactrim DS sent in hopefully will be cheaper.  Follow-up: Return in about 2 weeks (around 11/26/2016) for Hematuria, Ms. Hawks.  Mechele Claude, M.D.

## 2016-11-12 NOTE — Addendum Note (Signed)
Addended by: Mechele ClaudeSTACKS, Ermagene Saidi on: 11/12/2016 06:01 PM   Modules accepted: Orders

## 2016-11-14 LAB — URINE CULTURE

## 2016-11-17 ENCOUNTER — Telehealth: Payer: Self-pay | Admitting: *Deleted

## 2016-11-17 ENCOUNTER — Ambulatory Visit (INDEPENDENT_AMBULATORY_CARE_PROVIDER_SITE_OTHER): Payer: Medicare Other | Admitting: *Deleted

## 2016-11-17 DIAGNOSIS — I4891 Unspecified atrial fibrillation: Secondary | ICD-10-CM | POA: Diagnosis not present

## 2016-11-17 LAB — COAGUCHEK XS/INR WAIVED
INR: 3.3 — AB (ref 0.9–1.1)
Prothrombin Time: 40.2 s

## 2016-11-17 NOTE — Telephone Encounter (Signed)
Restless leg syndrome. Patient is requesting Rx for Ropinorole sent to [facility] pharmacy.

## 2016-11-17 NOTE — Progress Notes (Signed)
Subjective:     Indication: atrial fibrillation Bleeding signs/symptoms: None Thromboembolic signs/symptoms: None  Missed Coumadin doses: None Medication changes: yes - 14 day course of Bactrim started on 11/12/16 Dietary changes: no Bacterial/viral infection: yes - UTI Other concerns: no  The following portions of the patient's history were reviewed and updated as appropriate: allergies and current medications.  Review of Systems Pertinent items are noted in HPI.   Objective:    INR Today: 3.3 Current dose: 2.5mg  on T, Th, and Sat and 5mg  all other days  Assessment:    Supratherapeutic INR for goal of 2-3   Plan:    1. New dose: Hold today's dose and tomorrow resume normal schedule   2. Next INR: Keep appt with Marcelino DusterMichelle on 11/21/16     Discussed with Jannifer Rodneyhristy Hawks, FNP  Demetrios LollKristen Lanessa Shill, RN

## 2016-11-17 NOTE — Patient Instructions (Signed)
Description   Do not take coumadin today. Tomorrow start back on schedule of 1/2 tablet on Tuesday, Thursday, and Saturday and 1 whole tablet all other days.   INR today is 3.3, which is too thin.

## 2016-11-18 NOTE — Telephone Encounter (Signed)
Patient is taking Klonopin at night

## 2016-11-18 NOTE — Telephone Encounter (Signed)
Is the patient taking Klonopin at night? We may need to talk about starting this at his next visit

## 2016-11-21 ENCOUNTER — Ambulatory Visit (INDEPENDENT_AMBULATORY_CARE_PROVIDER_SITE_OTHER): Payer: Medicare Other | Admitting: Pharmacist Clinician (PhC)/ Clinical Pharmacy Specialist

## 2016-11-21 DIAGNOSIS — I4891 Unspecified atrial fibrillation: Secondary | ICD-10-CM

## 2016-11-21 LAB — COAGUCHEK XS/INR WAIVED
INR: 3.2 — AB (ref 0.9–1.1)
Prothrombin Time: 38.7 s

## 2016-11-21 NOTE — Patient Instructions (Signed)
Description   Change schedule to 1/2 tablet on Tuesday, Thursday, Saturday and Sunday and 1 whole tablet all other days.   INR today is 3.2

## 2016-11-26 ENCOUNTER — Encounter: Payer: Self-pay | Admitting: Family

## 2016-11-26 ENCOUNTER — Ambulatory Visit (INDEPENDENT_AMBULATORY_CARE_PROVIDER_SITE_OTHER): Payer: Medicare Other | Admitting: Family

## 2016-11-26 VITALS — BP 106/69 | HR 79 | Temp 97.3°F | Ht 70.0 in | Wt 185.0 lb

## 2016-11-26 DIAGNOSIS — R319 Hematuria, unspecified: Secondary | ICD-10-CM | POA: Diagnosis not present

## 2016-11-26 DIAGNOSIS — I4891 Unspecified atrial fibrillation: Secondary | ICD-10-CM

## 2016-11-26 DIAGNOSIS — Z7901 Long term (current) use of anticoagulants: Secondary | ICD-10-CM | POA: Diagnosis not present

## 2016-11-26 DIAGNOSIS — R8290 Unspecified abnormal findings in urine: Secondary | ICD-10-CM | POA: Diagnosis not present

## 2016-11-26 DIAGNOSIS — F17200 Nicotine dependence, unspecified, uncomplicated: Secondary | ICD-10-CM

## 2016-11-26 LAB — URINALYSIS, COMPLETE
BILIRUBIN UA: NEGATIVE
Glucose, UA: NEGATIVE
KETONES UA: NEGATIVE
LEUKOCYTES UA: NEGATIVE
NITRITE UA: NEGATIVE
PH UA: 5 (ref 5.0–7.5)
Protein, UA: NEGATIVE
UUROB: 0.2 mg/dL (ref 0.2–1.0)

## 2016-11-26 LAB — COAGUCHEK XS/INR WAIVED
INR: 3.1 — AB (ref 0.9–1.1)
Prothrombin Time: 37.2 s

## 2016-11-26 LAB — MICROSCOPIC EXAMINATION
BACTERIA UA: NONE SEEN
Epithelial Cells (non renal): NONE SEEN /hpf (ref 0–10)
Renal Epithel, UA: NONE SEEN /hpf
WBC UA: NONE SEEN /HPF (ref 0–?)

## 2016-11-26 NOTE — Patient Instructions (Signed)

## 2016-11-26 NOTE — Progress Notes (Signed)
   Subjective:    Patient ID: Jacob Sanders, male    DOB: 10/02/1945, 71 y.o.   MRN: [id number]  HPI Pt presents to the office today to recheck urine after a he was seen on 11/12/16 for hematuria. Pt was treated with Bactrim and states since completing he has not seen any blood and denies urinary frequency, urgency, dysuria, or flank pain.   Pt also getting his INR rechecked today since being on antibiotics. See flowsheet. PT taking warfarin for A Fib. Which is stable.    Review of Systems  All other systems reviewed and are negative.      Objective:   Physical Exam  Constitutional: He is oriented to person, place, and time. He appears well-developed and well-nourished. No distress.  HENT:  Head: Normocephalic.  Cardiovascular: Normal rate, regular rhythm, normal heart sounds and intact distal pulses.  No murmur heard. Pulmonary/Chest: Effort normal and breath sounds normal. No respiratory distress. He has no wheezes.  Abdominal: Soft. Bowel sounds are normal. He exhibits no distension. There is no tenderness.  Musculoskeletal: Normal range of motion. He exhibits no edema or tenderness.  Neurological: He is alert and oriented to person, place, and time.  Skin: Skin is warm and dry. No rash noted. No erythema.  Psychiatric: He has a normal mood and affect. His behavior is normal. Judgment and thought content normal.  Vitals reviewed.    BP 106/69   Pulse 79   Temp (!) 97.3 F (36.3 C) (Oral)   Ht 5\' 10"  (1.778 m)   Wt 185 lb (83.9 kg)   BMI 26.54 kg/m      Assessment & Plan:  1. Urine abnormality - Urinalysis, Complete  2. Anticoagulated on warfarin - CoaguChek XS/INR Waived  3. Atrial fibrillation, unspecified type (HCC) - CoaguChek XS/INR Waived Description   Change schedule to 1/2 tablet  all other days.   INR today is 3.1        4. Hematuria, unspecified type - Ambulatory referral to Urology  5. Smoker - Ambulatory referral to Urology   Will do  Urologists referral- Worrisome for bladder cancer with patient's long hx of smoking Smoking cessation discussed Urine culture pending   Jannifer Rodneyhristy Noreta Kue, FNP

## 2016-11-27 LAB — URINE CULTURE: Organism ID, Bacteria: NO GROWTH

## 2016-12-19 ENCOUNTER — Encounter: Payer: Self-pay | Admitting: Pharmacist Clinician (PhC)/ Clinical Pharmacy Specialist

## 2017-01-28 ENCOUNTER — Encounter: Payer: Self-pay | Admitting: *Deleted

## 2017-02-23 ENCOUNTER — Other Ambulatory Visit: Payer: Self-pay | Admitting: Family

## 2017-02-23 NOTE — Telephone Encounter (Signed)
OV 02/25/17

## 2017-02-24 ENCOUNTER — Ambulatory Visit: Payer: Medicare Other | Admitting: Family

## 2017-02-25 ENCOUNTER — Ambulatory Visit (INDEPENDENT_AMBULATORY_CARE_PROVIDER_SITE_OTHER): Payer: Medicare Other | Admitting: Family

## 2017-02-25 ENCOUNTER — Encounter: Payer: Self-pay | Admitting: Family

## 2017-02-25 VITALS — BP 137/89 | HR 76 | Temp 96.9°F | Ht 70.0 in | Wt 194.6 lb

## 2017-02-25 DIAGNOSIS — I10 Essential (primary) hypertension: Secondary | ICD-10-CM

## 2017-02-25 DIAGNOSIS — M1990 Unspecified osteoarthritis, unspecified site: Secondary | ICD-10-CM | POA: Diagnosis not present

## 2017-02-25 DIAGNOSIS — F411 Generalized anxiety disorder: Secondary | ICD-10-CM | POA: Diagnosis not present

## 2017-02-25 DIAGNOSIS — F17200 Nicotine dependence, unspecified, uncomplicated: Secondary | ICD-10-CM

## 2017-02-25 DIAGNOSIS — E663 Overweight: Secondary | ICD-10-CM | POA: Diagnosis not present

## 2017-02-25 DIAGNOSIS — E785 Hyperlipidemia, unspecified: Secondary | ICD-10-CM | POA: Diagnosis not present

## 2017-02-25 DIAGNOSIS — E559 Vitamin D deficiency, unspecified: Secondary | ICD-10-CM | POA: Diagnosis not present

## 2017-02-25 DIAGNOSIS — I4891 Unspecified atrial fibrillation: Secondary | ICD-10-CM | POA: Diagnosis not present

## 2017-02-25 DIAGNOSIS — K219 Gastro-esophageal reflux disease without esophagitis: Secondary | ICD-10-CM | POA: Diagnosis not present

## 2017-02-25 LAB — COAGUCHEK XS/INR WAIVED
INR: 1.2 — ABNORMAL HIGH (ref 0.9–1.1)
PROTHROMBIN TIME: 14.9 s

## 2017-02-25 NOTE — Progress Notes (Signed)
Subjective:    Patient ID: Jacob Sanders, male    DOB: 1946/01/11, 72 y.o.   MRN: [id number]  Pt presents to the office today for chronic follow up.  Gastroesophageal Reflux  He complains of heartburn and a hoarse voice. He reports no belching or no coughing. This is a chronic problem. The current episode started more than 1 year ago. The problem occurs occasionally. The problem has been waxing and waning. The symptoms are aggravated by certain foods. He has tried a PPI for the symptoms. The treatment provided moderate relief.  Hypertension  This is a chronic problem. The current episode started more than 1 year ago. The problem has been resolved since onset. The problem is controlled. Associated symptoms include anxiety. Pertinent negatives include no headaches, malaise/fatigue, peripheral edema or shortness of breath. Risk factors for coronary artery disease include dyslipidemia, obesity, male gender, sedentary lifestyle and smoking/tobacco exposure. The current treatment provides moderate improvement.  Arthritis  Presents for follow-up visit. He complains of pain and stiffness. The symptoms have been stable. Affected locations include the left knee, right knee, left hip and right hip. His pain is at a severity of 2/10.  Anxiety  Presents for follow-up visit. Symptoms include excessive worry, irritability, nervous/anxious behavior and restlessness. Patient reports no shortness of breath. Symptoms occur occasionally.    A Fib Pt currently taking warfarin. Denies any palpitations or SOB. Stable. See anticoagulation flow sheet.     Review of Systems  Constitutional: Positive for irritability. Negative for malaise/fatigue.  HENT: Positive for hoarse voice.   Respiratory: Negative for cough and shortness of breath.   Gastrointestinal: Positive for heartburn.  Musculoskeletal: Positive for arthritis and stiffness.  Neurological: Negative for headaches.  Psychiatric/Behavioral: The patient  is nervous/anxious.        Objective:   Physical Exam  Constitutional: He is oriented to person, place, and time. He appears well-developed and well-nourished. No distress.  HENT:  Head: Normocephalic.  Right Ear: External ear normal.  Left Ear: External ear normal.  Nose: Nose normal.  Mouth/Throat: Oropharynx is clear and moist.  Eyes: Pupils are equal, round, and reactive to light. Right eye exhibits no discharge. Left eye exhibits no discharge.  Neck: Normal range of motion. Neck supple. No thyromegaly present.  Cardiovascular: Normal rate, normal heart sounds and intact distal pulses. An irregular rhythm present.  No murmur heard. Pulmonary/Chest: Effort normal. No respiratory distress. He has decreased breath sounds. He has no wheezes.  Abdominal: Soft. Bowel sounds are normal. He exhibits no distension. There is no tenderness.  Musculoskeletal: Normal range of motion. He exhibits no edema or tenderness.  Neurological: He is alert and oriented to person, place, and time. He has normal reflexes. No cranial nerve deficit.  Skin: Skin is warm and dry. No rash noted. No erythema.  Psychiatric: He has a normal mood and affect. His behavior is normal. Judgment and thought content normal.  Vitals reviewed.     BP 137/89   Pulse 76   Temp (!) 96.9 F (36.1 C) (Oral)   Ht 5' 10"  (1.778 m)   Wt 194 lb 9.6 oz (88.3 kg)   BMI 27.92 kg/m      Assessment & Plan:  1. Essential hypertension, benign - CMP14+EGFR  2. Atrial fibrillation, unspecified type [facility]) Description   Change schedule to 1 tablet (5 mg) on Monday, Wednesday, and Friday and 1/2 tab (2.5 mg)  all other days (Sunday, Tuesday, Thursday, & Saturday)   INR today is  1.2      - CMP14+EGFR - CoaguChek XS/INR Waived  3. Gastroesophageal reflux disease, esophagitis presence not specified - CMP14+EGFR  4. Arthritis - CMP14+EGFR  5. Hyperlipidemia, unspecified hyperlipidemia type - CMP14+EGFR - Lipid  panel  6. Current smoker - CMP14+EGFR  7. Overweight (BMI 25.0-29.9) - CMP14+EGFR  8. Vitamin D deficiency - CMP14+EGFR - VITAMIN D 25 Hydroxy (Vit-D Deficiency, Fractures)   9. GAD (generalized anxiety disorder)   Continue all meds Labs pending Health Maintenance reviewed Diet and exercise encouraged RTO 2 week with Sharyn Lull to recheck INR and 6 months with me  Evelina Dun, FNP

## 2017-02-26 ENCOUNTER — Other Ambulatory Visit: Payer: Self-pay | Admitting: Family

## 2017-02-26 LAB — CMP14+EGFR
A/G RATIO: 1.6 (ref 1.2–2.2)
ALT: 11 IU/L (ref 0–44)
AST: 11 IU/L (ref 0–40)
Albumin: 4.4 g/dL (ref 3.6–4.8)
Alkaline Phosphatase: 110 IU/L (ref 39–117)
BILIRUBIN TOTAL: 0.4 mg/dL (ref 0.0–1.2)
BUN/Creatinine Ratio: 12 (ref 10–24)
BUN: 12 mg/dL (ref 8–27)
CALCIUM: 9.3 mg/dL (ref 8.6–10.2)
CHLORIDE: 102 mmol/L (ref 96–106)
CO2: 21 mmol/L (ref 20–29)
Creatinine, Ser: 0.98 mg/dL (ref 0.76–1.27)
GFR calc Af Amer: 91 mL/min/{1.73_m2} (ref >59)
GFR, EST NON AFRICAN AMERICAN: 78 mL/min/{1.73_m2} (ref >59)
Globulin, Total: 2.8 g/dL (ref 1.5–4.5)
Glucose: 115 mg/dL — ABNORMAL HIGH (ref 65–99)
POTASSIUM: 4.3 mmol/L (ref 3.5–5.2)
Sodium: 140 mmol/L (ref 134–144)
Total Protein: 7.2 g/dL (ref 6.0–8.5)

## 2017-02-26 LAB — LIPID PANEL
CHOLESTEROL TOTAL: 190 mg/dL (ref 100–199)
Chol/HDL Ratio: 3.7 ratio (ref 0.0–5.0)
HDL: 52 mg/dL (ref >39)
LDL Calculated: 118 mg/dL — ABNORMAL HIGH (ref 0–99)
TRIGLYCERIDES: 98 mg/dL (ref 0–149)
VLDL Cholesterol Cal: 20 mg/dL (ref 5–40)

## 2017-02-26 LAB — VITAMIN D 25 HYDROXY (VIT D DEFICIENCY, FRACTURES): Vit D, 25-Hydroxy: 17.9 ng/mL — ABNORMAL LOW (ref 30.0–100.0)

## 2017-02-26 MED ORDER — VITAMIN D (ERGOCALCIFEROL) 1.25 MG (50000 UNIT) PO CAPS: 50000.0000 [IU] | ORAL_CAPSULE | ORAL | 3 refills | Status: DC

## 2017-02-27 ENCOUNTER — Telehealth: Payer: Self-pay | Admitting: Family

## 2017-02-27 MED ORDER — CLONAZEPAM 0.5 MG PO TABS: 0.5000 mg | ORAL_TABLET | Freq: Two times a day (BID) | ORAL | 5 refills | Status: DC | PRN

## 2017-02-27 NOTE — Telephone Encounter (Signed)
Please advise on refill.

## 2017-02-27 NOTE — Telephone Encounter (Signed)
Prescription sent to pharmacy.

## 2017-03-02 ENCOUNTER — Encounter: Payer: Self-pay | Admitting: Nurse Practitioner

## 2017-03-02 ENCOUNTER — Ambulatory Visit (INDEPENDENT_AMBULATORY_CARE_PROVIDER_SITE_OTHER): Payer: Medicare Other | Admitting: Nurse Practitioner

## 2017-03-02 VITALS — BP 146/96 | HR 90 | Temp 97.0°F | Ht 70.0 in | Wt 195.0 lb

## 2017-03-02 DIAGNOSIS — R319 Hematuria, unspecified: Secondary | ICD-10-CM | POA: Diagnosis not present

## 2017-03-02 LAB — URINALYSIS, COMPLETE
BILIRUBIN UA: NEGATIVE
Glucose, UA: NEGATIVE
Leukocytes, UA: NEGATIVE
NITRITE UA: NEGATIVE
PH UA: 5.5 (ref 5.0–7.5)
SPEC GRAV UA: 1.025 (ref 1.005–1.030)
UUROB: 1 mg/dL (ref 0.2–1.0)

## 2017-03-02 LAB — MICROSCOPIC EXAMINATION
Bacteria, UA: NONE SEEN
Epithelial Cells (non renal): NONE SEEN /hpf (ref 0–10)
RENAL EPITHEL UA: NONE SEEN /HPF
WBC UA: NONE SEEN /HPF (ref 0–?)

## 2017-03-02 MED ORDER — SULFAMETHOXAZOLE-TRIMETHOPRIM 800-160 MG PO TABS: 1.0000 | ORAL_TABLET | Freq: Two times a day (BID) | ORAL | 0 refills | Status: DC

## 2017-03-02 NOTE — Progress Notes (Addendum)
   Subjective:    Patient ID: Jacob Sanders, male    DOB: 16-Dec-1945, 72 y.o.   MRN: [id number]  HPI Patient comes in today c/o burning with urination and blood in urine.  Started yesterday.    Review of Systems  Constitutional: Negative for chills and fever.  Respiratory: Negative.   Cardiovascular: Negative.   Gastrointestinal: Negative.   Genitourinary: Positive for dysuria, frequency and urgency.  Neurological: Negative.   Psychiatric/Behavioral: Negative.   All other systems reviewed and are negative.      Objective:   Physical Exam  Constitutional: He is oriented to person, place, and time. He appears well-developed and well-nourished. No distress.  Cardiovascular: Normal rate.  Heart irregluar  Pulmonary/Chest: Effort normal.  Abdominal: There is no tenderness.  Genitourinary:  Genitourinary Comments: No cva tenderness  Neurological: He is alert and oriented to person, place, and time.  Skin: Skin is warm.  Psychiatric: He has a normal mood and affect. His behavior is normal. Judgment and thought content normal.   BP (!) 146/96   Pulse 90   Temp (!) 97 F (36.1 C) (Oral)   Ht 5\' 10"  (1.778 m)   Wt 195 lb (88.5 kg)   BMI 27.98 kg/m   Hematuria       Assessment & Plan:   1. Hematuria, unspecified type    Orders Placed This Encounter  Procedures  . Urinalysis, Complete  . Ambulatory referral to Urology    Referral Priority:   Routine    Referral Type:   Consultation    Referral Reason:   Specialty Services Required    Referred to Provider:   Bjorn PippinWrenn, John, MD    Requested Specialty:   Urology    Number of Visits Requested:   1   Meds ordered this encounter  Medications  . sulfamethoxazole-trimethoprim (BACTRIM DS) 800-160 MG tablet    Sig: Take 1 tablet by mouth 2 (two) times daily.    Dispense:  20 tablet    Refill:  0    Order Specific Question:   Supervising Provider    Answer:   Ala BentVINCENT, CAROL L [[id number]]   Force fluids  Keep appointment  with dr. Donnetta Hailwreen when made Need INR checked in within 2 weeks  Mary-Margaret Daphine DeutscherMartin, FNP

## 2017-03-02 NOTE — Patient Instructions (Signed)

## 2017-03-10 ENCOUNTER — Other Ambulatory Visit: Payer: Self-pay | Admitting: Family

## 2017-03-10 DIAGNOSIS — I4891 Unspecified atrial fibrillation: Secondary | ICD-10-CM

## 2017-03-13 DIAGNOSIS — R310 Gross hematuria: Secondary | ICD-10-CM | POA: Diagnosis not present

## 2017-03-17 ENCOUNTER — Encounter: Payer: Self-pay | Admitting: Nurse Practitioner

## 2017-03-17 ENCOUNTER — Ambulatory Visit (INDEPENDENT_AMBULATORY_CARE_PROVIDER_SITE_OTHER): Payer: Medicare Other | Admitting: Nurse Practitioner

## 2017-03-17 VITALS — BP 141/85 | HR 73 | Temp 96.5°F | Ht 70.0 in | Wt 196.2 lb

## 2017-03-17 DIAGNOSIS — R319 Hematuria, unspecified: Secondary | ICD-10-CM

## 2017-03-17 DIAGNOSIS — I4891 Unspecified atrial fibrillation: Secondary | ICD-10-CM

## 2017-03-17 LAB — URINALYSIS, COMPLETE
BILIRUBIN UA: NEGATIVE
GLUCOSE, UA: NEGATIVE
Leukocytes, UA: NEGATIVE
NITRITE UA: NEGATIVE
PH UA: 5.5 (ref 5.0–7.5)
PROTEIN UA: NEGATIVE
Specific Gravity, UA: >1.03 — ABNORMAL HIGH (ref 1.005–1.030)
UUROB: 0.2 mg/dL (ref 0.2–1.0)

## 2017-03-17 LAB — COAGUCHEK XS/INR WAIVED
INR: 1.3 — ABNORMAL HIGH (ref 0.9–1.1)
PROTHROMBIN TIME: 15 s

## 2017-03-17 LAB — MICROSCOPIC EXAMINATION
BACTERIA UA: NONE SEEN
Epithelial Cells (non renal): NONE SEEN /hpf (ref 0–10)
WBC UA: NONE SEEN /HPF (ref 0–?)

## 2017-03-17 NOTE — Progress Notes (Signed)
Subjective:    Patient come in today for INR recheck   Indication: atrial fibrillation Bleeding signs/symptoms: he has had visible blood in his urine that cleared after stopping warfarin Thromboembolic signs/symptoms: None  Missed Coumadin doses: he has not taken his warfarin since last week because urologist told him if he see in blood in urine then he was to stop taking.  Medication changes: no Dietary changes: no Bacterial/viral infection: no Other concerns: no  The following portions of the patient's history were reviewed and updated as appropriate: allergies, current medications, past family history, past medical history, past social history, past surgical history and problem list.  Review of Systems Pertinent items noted in HPI and remainder of comprehensive ROS otherwise negative.   Objective:    INR Today: 1.3 Current dose: currently not taking anything    Assessment:    Subtherapeutic INR for goal of 2-3   Plan:    hematuria - scares patient to death when e sees bl ood in urine- he does not have follow up or ct scan for 2 more weeks  Atrial fib -was given xarelto samples to take 10mg  -20mg  a day until finds out from urology where bleeding is coming from. Was told to cut xarelto in half if he can and just take 10mg  daily if cannot then just take 1 tablet daily.

## 2017-03-17 NOTE — Patient Instructions (Signed)
Rivaroxaban oral tablets °What is this medicine? °RIVAROXABAN (ri va ROX a ban) is an anticoagulant (blood thinner). It is used to treat blood clots in the lungs or in the veins. It is also used after knee or hip surgeries to prevent blood clots. It is also used to lower the chance of stroke in people with a medical condition called atrial fibrillation. °This medicine may be used for other purposes; ask your health care provider or pharmacist if you have questions. °COMMON BRAND NAME(S): Xarelto, Xarelto Starter Pack °What should I tell my health care provider before I take this medicine? °They need to know if you have any of these conditions: °-bleeding disorders °-bleeding in the brain °-blood in your stools (black or tarry stools) or if you have blood in your vomit °-history of stomach bleeding °-kidney disease °-liver disease °-low blood counts, like low white cell, platelet, or red cell counts °-recent or planned spinal or epidural procedure °-take medicines that treat or prevent blood clots °-an unusual or allergic reaction to rivaroxaban, other medicines, foods, dyes, or preservatives °-pregnant or trying to get pregnant °-breast-feeding °How should I use this medicine? °Take this medicine by mouth with a glass of water. Follow the directions on the prescription label. Take your medicine at regular intervals. Do not take it more often than directed. Do not stop taking except on your doctor's advice. Stopping this medicine may increase your risk of a blood clot. Be sure to refill your prescription before you run out of medicine. °If you are taking this medicine after hip or knee replacement surgery, take it with or without food. If you are taking this medicine for atrial fibrillation, take it with your evening meal. If you are taking this medicine to treat blood clots, take it with food at the same time each day. If you are unable to swallow your tablet, you may crush the tablet and mix it in applesauce. Then,  immediately eat the applesauce. You should eat more food right after you eat the applesauce containing the crushed tablet. °Talk to your pediatrician regarding the use of this medicine in children. Special care may be needed. °Overdosage: If you think you have taken too much of this medicine contact a poison control center or emergency room at once. °NOTE: This medicine is only for you. Do not share this medicine with others. °What if I miss a dose? °If you take your medicine once a day and miss a dose, take the missed dose as soon as you remember. If you take your medicine twice a day and miss a dose, take the missed dose immediately. In this instance, 2 tablets may be taken at the same time. The next day you should take 1 tablet twice a day as directed. °What may interact with this medicine? °Do not take this medicine with any of the following medications: °-defibrotide °This medicine may also interact with the following medications: °-aspirin and aspirin-like medicines °-certain antibiotics like erythromycin, azithromycin, and clarithromycin °-certain medicines for fungal infections like ketoconazole and itraconazole °-certain medicines for irregular heart beat like amiodarone, quinidine, dronedarone °-certain medicines for seizures like carbamazepine, phenytoin °-certain medicines that treat or prevent blood clots like warfarin, enoxaparin, and dalteparin °-conivaptan °-diltiazem °-felodipine °-indinavir °-lopinavir; ritonavir °-NSAIDS, medicines for pain and inflammation, like ibuprofen or naproxen °-ranolazine °-rifampin °-ritonavir °-SNRIs, medicines for depression, like desvenlafaxine, duloxetine, levomilnacipran, venlafaxine °-SSRIs, medicines for depression, like citalopram, escitalopram, fluoxetine, fluvoxamine, paroxetine, sertraline °-St. John's wort °-verapamil °This list may not describe all   possible interactions. Give your health care provider a list of all the medicines, herbs, non-prescription  drugs, or dietary supplements you use. Also tell them if you smoke, drink alcohol, or use illegal drugs. Some items may interact with your medicine. °What should I watch for while using this medicine? °Visit your doctor or health care professional for regular checks on your progress. °Notify your doctor or health care professional and seek emergency treatment if you develop breathing problems; changes in vision; chest pain; severe, sudden headache; pain, swelling, warmth in the leg; trouble speaking; sudden numbness or weakness of the face, arm or leg. These can be signs that your condition has gotten worse. °If you are going to have surgery or other procedure, tell your doctor that you are taking this medicine. °What side effects may I notice from receiving this medicine? °Side effects that you should report to your doctor or health care professional as soon as possible: °-allergic reactions like skin rash, itching or hives, swelling of the face, lips, or tongue °-back pain °-redness, blistering, peeling or loosening of the skin, including inside the mouth °-signs and symptoms of bleeding such as bloody or black, tarry stools; red or dark-brown urine; spitting up blood or brown material that looks like coffee grounds; red spots on the skin; unusual bruising or bleeding from the eye, gums, or nose °Side effects that usually do not require medical attention (report to your doctor or health care professional if they continue or are bothersome): °-dizziness °-muscle pain °This list may not describe all possible side effects. Call your doctor for medical advice about side effects. You may report side effects to FDA at 1-800-FDA-1088. °Where should I keep my medicine? °Keep out of the reach of children. °Store at room temperature between 15 and 30 degrees C (59 and 86 degrees F). Throw away any unused medicine after the expiration date. °NOTE: This sheet is a summary. It may not cover all possible information. If you  have questions about this medicine, talk to your doctor, pharmacist, or health care provider. °© 2018 [employer] Standard (2015-09-19 16:29:33) ° °

## 2017-03-25 DIAGNOSIS — R310 Gross hematuria: Secondary | ICD-10-CM | POA: Diagnosis not present

## 2017-03-26 ENCOUNTER — Ambulatory Visit (INDEPENDENT_AMBULATORY_CARE_PROVIDER_SITE_OTHER): Payer: Medicare Other | Admitting: Family

## 2017-03-26 ENCOUNTER — Encounter: Payer: Self-pay | Admitting: Family

## 2017-03-26 VITALS — BP 138/113 | HR 58 | Temp 96.9°F | Ht 70.0 in | Wt 192.8 lb

## 2017-03-26 DIAGNOSIS — F17200 Nicotine dependence, unspecified, uncomplicated: Secondary | ICD-10-CM | POA: Diagnosis not present

## 2017-03-26 DIAGNOSIS — R350 Frequency of micturition: Secondary | ICD-10-CM

## 2017-03-26 DIAGNOSIS — I4891 Unspecified atrial fibrillation: Secondary | ICD-10-CM

## 2017-03-26 LAB — URINALYSIS, COMPLETE
BILIRUBIN UA: NEGATIVE
Glucose, UA: NEGATIVE
LEUKOCYTES UA: NEGATIVE
Nitrite, UA: NEGATIVE
PROTEIN UA: NEGATIVE
UUROB: 1 mg/dL (ref 0.2–1.0)
pH, UA: 5 (ref 5.0–7.5)

## 2017-03-26 LAB — MICROSCOPIC EXAMINATION
BACTERIA UA: NONE SEEN
EPITHELIAL CELLS (NON RENAL): NONE SEEN /HPF (ref 0–10)
Renal Epithel, UA: NONE SEEN /hpf
WBC, UA: NONE SEEN /hpf (ref 0–?)

## 2017-03-26 LAB — COAGUCHEK XS/INR WAIVED
INR: 1.1 (ref 0.9–1.1)
Prothrombin Time: 13 s

## 2017-03-26 NOTE — Addendum Note (Signed)
Addended by: Margorie JohnJOHNSON, Anallely Rosell M on: 03/26/2017 11:37 AM   Modules accepted: Orders

## 2017-03-26 NOTE — Progress Notes (Signed)
   Subjective:    Patient ID: Jacob Sanders, male    DOB: 1945/12/03, 72 y.o.   MRN: [id number]  HPI PT presents to the office today with complaints of urinary frequency. PT is being seen by Urologoists for hematuria. PT was told that if he saw blood in her urine to stop his warfarin.  Pt reports not taking his warfarin for the last two weeks.    PT had a CT scan of his abdomen for the hematuria and has follow up with 03/31/17 and is scheduled for cystoscopy.   Pt has A fib and a current smoker. PT states he does not wish to be on warfarin at this time, because he can not "risk bleeding out". We have discussed the importance of anticoagulation on multiple visits and patient has stopped his warfarin multiple times because he was "scared", but states he would like to hold off until after the "blood in his urine" stops. Pt denies any gross hematuria in the past 2 weeks.     Review of Systems  Genitourinary: Positive for hematuria.  All other systems reviewed and are negative.      Objective:   Physical Exam  Constitutional: He is oriented to person, place, and time. He appears well-developed and well-nourished. No distress.  HENT:  Head: Normocephalic.  Right Ear: External ear normal.  Left Ear: External ear normal.  Mouth/Throat: Posterior oropharyngeal erythema present.  Eyes: Pupils are equal, round, and reactive to light. Right eye exhibits no discharge. Left eye exhibits no discharge.  Neck: Normal range of motion. Neck supple. No thyromegaly present.  Cardiovascular: Normal rate, regular rhythm, normal heart sounds and intact distal pulses.  No murmur heard. Pulmonary/Chest: Effort normal. No respiratory distress. He has decreased breath sounds. He has no wheezes.  Abdominal: Soft. Bowel sounds are normal. He exhibits no distension. There is no tenderness.  Musculoskeletal: Normal range of motion. He exhibits no edema or tenderness.  Neurological: He is alert and oriented to  person, place, and time.  Skin: Skin is warm and dry. No rash noted. No erythema.  Psychiatric: He has a normal mood and affect. His behavior is normal. Judgment and thought content normal.  Vitals reviewed.   BP (!) 138/113   Pulse (!) 58   Temp (!) 96.9 F (36.1 C) (Oral)   Ht 5\' 10"  (1.778 m)   Wt 192 lb 12.8 oz (87.5 kg)   BMI 27.66 kg/m      Assessment & Plan:  1. Urine frequency - Urinalysis, Complete  2. Atrial fibrillation, unspecified type (HCC) - CoaguChek XS/INR Waived; Standing  3. Current smoker   Long discussion with patient about risks of stopping anticoagulation therapy. PT refuses to be on Xarelto or Eliquis stating he has "seen commercials on how they have killed patients".   Keep all follow up appts with Urologists  Smoking cessation discussed RTO prn    Jannifer Rodneyhristy Chandel Zaun, FNP

## 2017-03-26 NOTE — Patient Instructions (Signed)

## 2017-03-27 ENCOUNTER — Other Ambulatory Visit: Payer: Self-pay | Admitting: Family

## 2017-03-27 ENCOUNTER — Encounter: Payer: Medicare Other | Admitting: Pharmacist Clinician (PhC)/ Clinical Pharmacy Specialist

## 2017-04-14 ENCOUNTER — Ambulatory Visit: Payer: Medicare Other | Admitting: Family

## 2017-04-15 ENCOUNTER — Other Ambulatory Visit: Payer: Self-pay | Admitting: Family

## 2017-04-27 ENCOUNTER — Ambulatory Visit (INDEPENDENT_AMBULATORY_CARE_PROVIDER_SITE_OTHER): Payer: Medicare Other

## 2017-04-27 ENCOUNTER — Encounter: Payer: Self-pay | Admitting: Physician Assistant

## 2017-04-27 ENCOUNTER — Ambulatory Visit (INDEPENDENT_AMBULATORY_CARE_PROVIDER_SITE_OTHER): Payer: Medicare Other | Admitting: Physician Assistant

## 2017-04-27 VITALS — BP 140/98 | HR 84 | Temp 96.3°F | Ht 70.0 in | Wt 191.8 lb

## 2017-04-27 DIAGNOSIS — K5900 Constipation, unspecified: Secondary | ICD-10-CM | POA: Diagnosis not present

## 2017-04-27 LAB — URINALYSIS
Bilirubin, UA: NEGATIVE
Glucose, UA: NEGATIVE
LEUKOCYTES UA: NEGATIVE
NITRITE UA: NEGATIVE
PH UA: 5 (ref 5.0–7.5)
Protein, UA: NEGATIVE
SPEC GRAV UA: 1.025 (ref 1.005–1.030)
Urobilinogen, Ur: 0.2 mg/dL (ref 0.2–1.0)

## 2017-04-27 IMAGING — DX DG ABDOMEN 1V
2 series · 2 of 2 positions shown · non-contrast
Comparison: CT [DATE]

CLINICAL DATA: Constipation and difficulty voiding

EXAM:
ABDOMEN - 1 VIEW

[abdomen kub (1 of 2)]
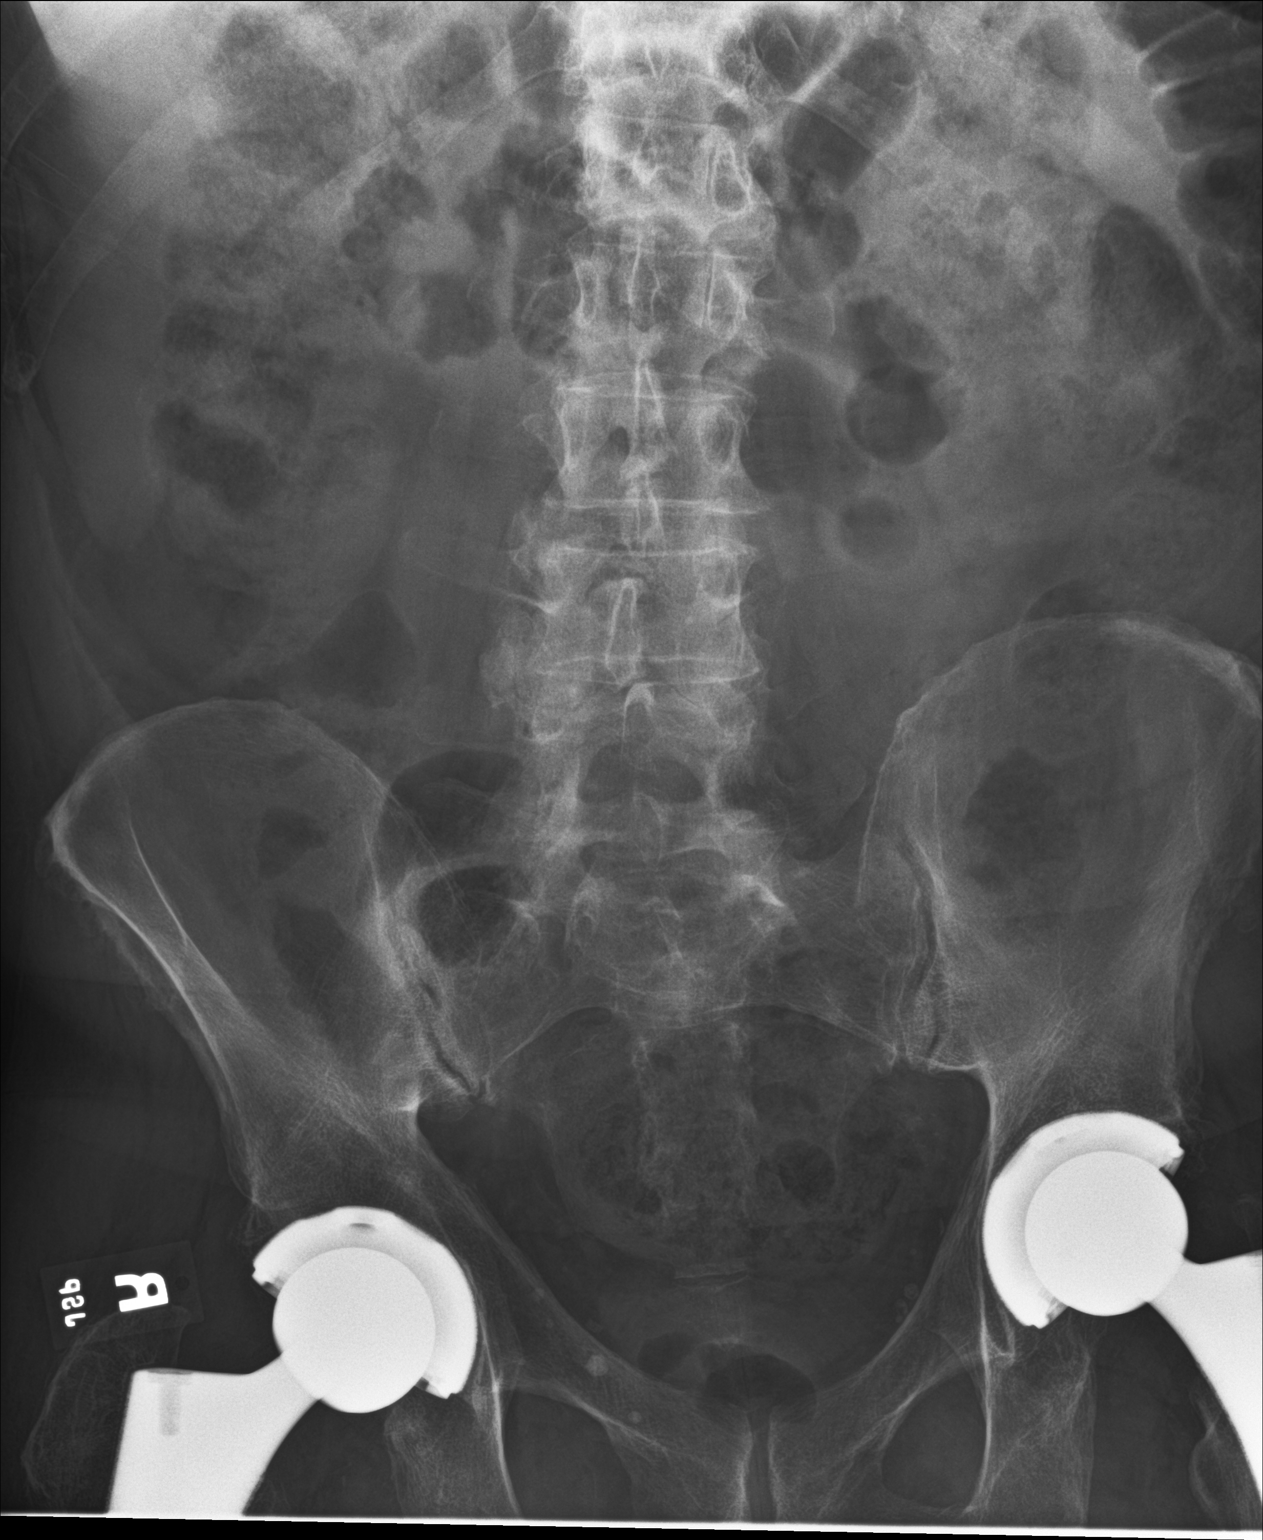

[abdomen kub (2 of 2)]
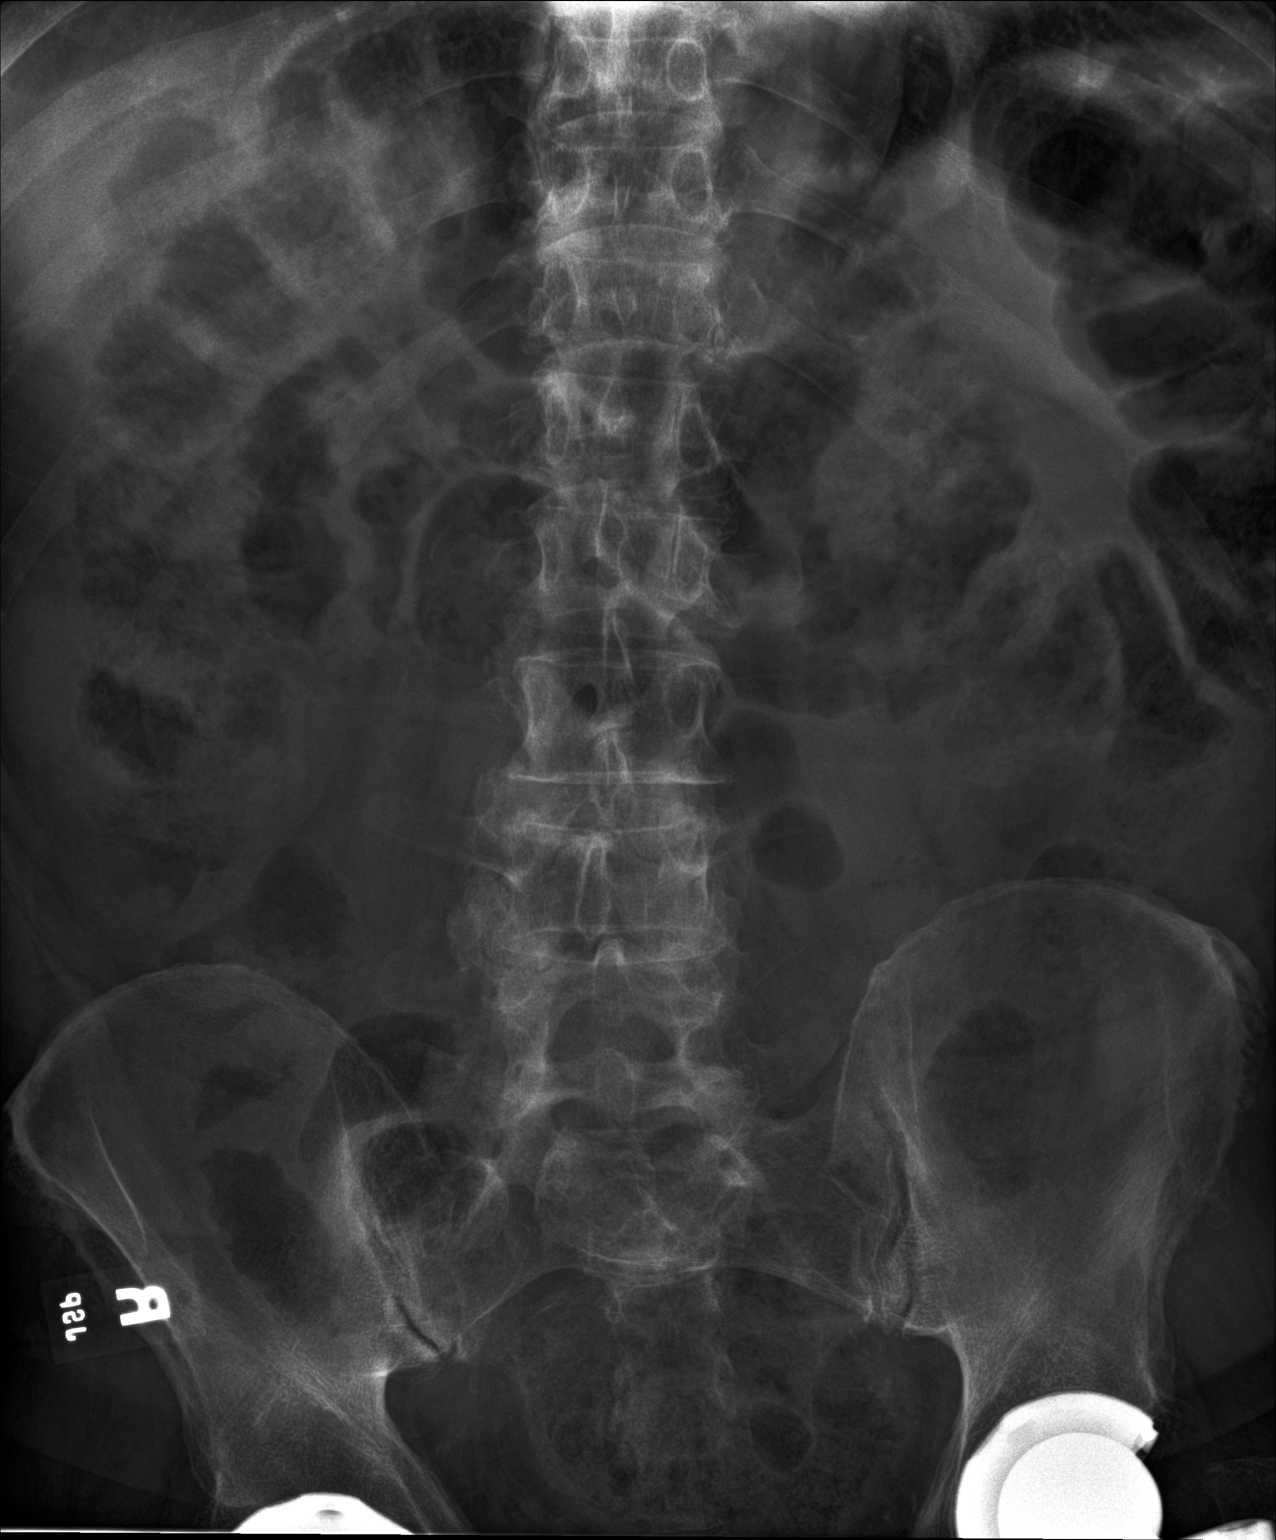

[2 of 2 positions shown; findings below may reference images not displayed]

FINDINGS: Nonobstructed bowel-gas pattern with moderate to large amount of
stool in the colon. No radiopaque calculi over the kidneys. Status
post bilateral hip replacements. Calcified phleboliths in the
pelvis.
IMPRESSION: Nonobstructed gas pattern with moderate to large amount of stool in
the colon

## 2017-04-27 NOTE — Patient Instructions (Signed)
Your child needs to take Miralax, a powder that you mix in a clear liquid.   1. Stir the Miralax powder into water, juice, or Gatorade. Your child's Miralax dose is:   8 capfuls of Miralax powder in 32 to 64 ounces of liquid  2. Give your child 4 to 8 ounces to drink every 30 minutes. It will take 4 to 6 hours for your child to finish the medicine. 3. After the medicine is gone, have your child drink more water or juice. This will help with the cleanout.  After the clean out, your child will take a daily (maintenance) medicine for at least 6 months. 1 capful of powder in 8 ounces of liquid every day  Your child may have stomach pain or cramping during the clean out. This might mean your child has to go to the bathroom. Have your child sit on the toilet. Explain that the pain will go away when the stool is gone. You may want to read to your child while you wait. A warm bath may also help.  Your child should have almost clear liquid stools by the end of the next day.   

## 2017-04-27 NOTE — Progress Notes (Signed)
BP (!) 140/98   Pulse 84   Temp (!) 96.3 F (35.7 C) (Oral)   Ht 5\' 10"  (1.778 m)   Wt 191 lb 12.8 oz (87 kg)   BMI 27.52 kg/m    Subjective:    Patient ID: Jacob Dakinshurman E Snyders, male    DOB: 1946-01-05, 72 y.o.   MRN: [id number]  HPI: Jacob Sanders is a 72 y.o. male presenting on 04/27/2017 for Abdominal Pain (No bowel movement since Friday )  For the past 5 days the patient has had no bowel movement.  His last one was on 1 04/23/2017.  He normally has a bowel movement in the morning and multiple throughout the day.  They are usually well formed and easy to pass.  He has had straining over the past few days with minimal results.  He did try an enema today without any relief.  He is having generalized pain.  He denies any fever or chills.  There is no change in his urine.     Past Medical History:  Diagnosis Date  . Arthritis   . GERD (gastroesophageal reflux disease)   . Hyperlipidemia   . Hypertension    Relevant past medical, surgical, family and social history reviewed and updated as indicated. Interim medical history since our last visit reviewed. Allergies and medications reviewed and updated. DATA REVIEWED: CHART IN EPIC  Family History reviewed for pertinent findings.  Review of Systems  Constitutional: Negative.  Negative for appetite change and fatigue.  Eyes: Negative for pain and visual disturbance.  Respiratory: Negative.  Negative for cough, chest tightness, shortness of breath and wheezing.   Cardiovascular: Negative.  Negative for chest pain, palpitations and leg swelling.  Gastrointestinal: Positive for abdominal pain and constipation. Negative for anal bleeding, blood in stool, diarrhea, nausea, rectal pain and vomiting.  Genitourinary: Negative.   Skin: Negative.  Negative for color change and rash.  Neurological: Negative.  Negative for weakness, numbness and headaches.  Psychiatric/Behavioral: Negative.     Allergies as of 04/27/2017      Reactions   Prednisone Swelling      Medication List        Accurate as of 04/27/17  6:26 PM. Always use your most recent med list.          clonazePAM 0.5 MG tablet Commonly known as:  KLONOPIN Take 1 tablet (0.5 mg total) by mouth 2 (two) times daily as needed for anxiety.   diltiazem 240 MG 24 hr tablet Commonly known as:  CARDIZEM LA TAKE 1 TABLET BY MOUTH  DAILY   meclizine 25 MG tablet Commonly known as:  ANTIVERT TAKE 1 TABLET BY MOUTH 3  TIMES DAILY AS NEEDED FOR  DIZZINESS   omeprazole 20 MG capsule Commonly known as:  PRILOSEC TAKE 1 CAPSULE BY MOUTH  DAILY AS NEEDED   Vitamin D (Ergocalciferol) 50000 units Caps capsule Commonly known as:  DRISDOL Take 1 capsule (50,000 Units total) by mouth every 7 (seven) days.   warfarin 5 MG tablet Commonly known as:  COUMADIN Take as directed by the anticoagulation clinic. If you are unsure how to take this medication, talk to your nurse or doctor. Original instructions:  TAKE 1 TABLET BY MOUTH  DAILY          Objective:    BP (!) 140/98   Pulse 84   Temp (!) 96.3 F (35.7 C) (Oral)   Ht 5\' 10"  (1.778 m)   Wt 191 lb 12.8 oz (  87 kg)   BMI 27.52 kg/m   Allergies  Allergen Reactions  . Prednisone Swelling    Wt Readings from Last 3 Encounters:  04/27/17 191 lb 12.8 oz (87 kg)  03/26/17 192 lb 12.8 oz (87.5 kg)  03/17/17 196 lb 3.2 oz (89 kg)    Physical Exam  Constitutional: He appears well-developed and well-nourished. No distress.  HENT:  Head: Normocephalic and atraumatic.  Eyes: Pupils are equal, round, and reactive to light. Conjunctivae and EOM are normal.  Cardiovascular: Normal rate, regular rhythm and normal heart sounds.  Pulmonary/Chest: Effort normal and breath sounds normal. No respiratory distress.  Abdominal: Bowel sounds are normal. He exhibits no shifting dullness, no distension, no pulsatile liver, no fluid wave, no abdominal bruit, no ascites, no pulsatile midline mass and no mass. There is no  splenomegaly or hepatomegaly. There is tenderness in the left lower quadrant. There is no rigidity, no guarding and no CVA tenderness. No hernia.  Skin: Skin is warm and dry.  Psychiatric: He has a normal mood and affect. His behavior is normal.  Nursing note and vitals reviewed.       Assessment & Plan:   1. Constipation, unspecified constipation type - DG Abd 1 View; Future - Urinalysis   Continue all other maintenance medications as listed above.  Follow up plan: Keep regular follow up  Educational handout given for CLEAN OUT INSTRUCTIONS  Remus Loffler PA-C [facility] [address]  [place], [place] [address] [phone]   04/27/2017, 6:26 PM

## 2017-05-12 ENCOUNTER — Other Ambulatory Visit: Payer: Self-pay | Admitting: Family

## 2017-07-29 ENCOUNTER — Other Ambulatory Visit: Payer: Self-pay | Admitting: Family

## 2017-07-29 DIAGNOSIS — I4891 Unspecified atrial fibrillation: Secondary | ICD-10-CM

## 2017-09-01 ENCOUNTER — Other Ambulatory Visit: Payer: Self-pay | Admitting: Family

## 2017-09-04 ENCOUNTER — Encounter: Payer: Self-pay | Admitting: Family Medicine

## 2017-09-04 ENCOUNTER — Ambulatory Visit (INDEPENDENT_AMBULATORY_CARE_PROVIDER_SITE_OTHER): Payer: Medicare Other | Admitting: Family Medicine

## 2017-09-04 VITALS — BP 163/102 | HR 98 | Temp 97.7°F | Ht 70.0 in | Wt 192.0 lb

## 2017-09-04 DIAGNOSIS — N529 Male erectile dysfunction, unspecified: Secondary | ICD-10-CM | POA: Diagnosis not present

## 2017-09-04 NOTE — Progress Notes (Signed)
BP (!) 163/102   Pulse 98   Temp 97.7 F (36.5 C) (Oral)   Ht 5\' 10"  (1.778 m)   Wt 192 lb (87.1 kg)   BMI 27.55 kg/m    Subjective:    Patient ID: Jacob Sanders, male    DOB: 1945/07/22, 72 y.o.   MRN: [id number]  HPI: DENT PLANTZ is a 72 y.o. male presenting on 09/04/2017 for Erectile Dysfunction   HPI Erectile dysfunction She is coming in to discuss erectile dysfunction.  He says he is previously tried Viagra and did not have much benefit from it but he says that he feels like his wife will leave him if he does not figure something out soon.  He denies any pain with erections or any pain in his penis or testes but just says that he is unable to obtain and sustain erections.  He says he does have a friend who has tried a penile pump and he would like to try that.  Relevant past medical, surgical, family and social history reviewed and updated as indicated. Interim medical history since our last visit reviewed. Allergies and medications reviewed and updated.  Review of Systems  Constitutional: Negative for chills and fever.  Respiratory: Negative for shortness of breath and wheezing.   Cardiovascular: Negative for chest pain and leg swelling.  Genitourinary: Negative for discharge, penile pain and penile swelling.  Musculoskeletal: Negative for back pain and gait problem.  Skin: Negative for rash.  All other systems reviewed and are negative.   Per HPI unless specifically indicated above   Allergies as of 09/04/2017      Reactions   Prednisone Swelling      Medication List        Accurate as of 09/04/17  4:30 PM. Always use your most recent med list.          diltiazem 240 MG 24 hr tablet Commonly known as:  CARDIZEM LA TAKE 1 TABLET BY MOUTH  DAILY   meclizine 25 MG tablet Commonly known as:  ANTIVERT TAKE 1 TABLET BY MOUTH 3  TIMES A DAY AS NEEDED FOR  DIZZINESS   omeprazole 20 MG capsule Commonly known as:  PRILOSEC TAKE 1 CAPSULE BY MOUTH  DAILY  AS NEEDED            Durable Medical Equipment  (From admission, onward)         Start     Ordered   09/04/17 0000  DME Other see comment    Comments:  Penile vacuum pump with bands   09/04/17 1629             Objective:    BP (!) 163/102   Pulse 98   Temp 97.7 F (36.5 C) (Oral)   Ht 5\' 10"  (1.778 m)   Wt 192 lb (87.1 kg)   BMI 27.55 kg/m   Wt Readings from Last 3 Encounters:  09/04/17 192 lb (87.1 kg)  04/27/17 191 lb 12.8 oz (87 kg)  03/26/17 192 lb 12.8 oz (87.5 kg)    Physical Exam  Constitutional: He is oriented to person, place, and time. He appears well-developed and well-nourished. No distress.  Eyes: Conjunctivae are normal. No scleral icterus.  Genitourinary:  Genitourinary Comments: Patient declined exam  Neurological: He is alert and oriented to person, place, and time. Coordination normal.  Skin: Skin is warm and dry. No rash noted. He is not diaphoretic.  Psychiatric: He has a normal mood and affect. His  behavior is normal.  Nursing note and vitals reviewed.       Assessment & Plan:   Problem List Items Addressed This Visit    None    Visit Diagnoses    Erectile dysfunction, unspecified erectile dysfunction type    -  Primary   Relevant Orders   DME Other see comment     Prescribed a penile pump for the patient and instructed him not to leave it on more than 10 or 15 minutes, that is the most information I could find from the manufacturer based on what he says but I also told him that there should be more instructions in the device itself.  Follow up plan: Return if symptoms worsen or fail to improve.  Counseling provided for all of the vaccine components Orders Placed This Encounter  Procedures  . DME Other see comment    Arville CareJoshua Amjad Fikes, MD [facility] 09/04/2017, 4:30 PM

## 2017-09-04 NOTE — Patient Instructions (Signed)
Leave on for no more than 20 -30 minutes, let pain be your guide

## 2017-09-10 ENCOUNTER — Encounter: Payer: Self-pay | Admitting: Physician Assistant

## 2017-09-10 ENCOUNTER — Ambulatory Visit (INDEPENDENT_AMBULATORY_CARE_PROVIDER_SITE_OTHER): Payer: Medicare Other | Admitting: Physician Assistant

## 2017-09-10 VITALS — BP 139/89 | HR 76 | Temp 97.9°F | Ht 70.0 in | Wt 193.2 lb

## 2017-09-10 DIAGNOSIS — L820 Inflamed seborrheic keratosis: Secondary | ICD-10-CM | POA: Diagnosis not present

## 2017-09-10 DIAGNOSIS — L821 Other seborrheic keratosis: Secondary | ICD-10-CM

## 2017-09-10 NOTE — Patient Instructions (Addendum)
Cryoablation, Care After This sheet gives you information about how to care for yourself after your procedure. Your health care provider may also give you more specific instructions. If you have problems or questions, contact your health care provider. What can I expect after the procedure? After the procedure, it is common to have:  Soreness around the treatment area.  Mild pain and swelling in the treatment area.  Follow these instructions at home: Treatment area care   Follow instructions from your health care provider about how to take care of your incision. Make sure you: ? Wash your hands with soap and water before you change your bandage (dressing). If soap and water are not available, use hand sanitizer. ? Change your dressing as told by your health care provider. ? Leave stitches (sutures) in place. They may need to stay in place for 2 weeks or longer.  Check your treatment area every day for signs of infection. Check for: ? More redness, swelling, or pain. ? More fluid or blood. ? Warmth. ? Pus or a bad smell.  Keep the treated area clean, dry, and covered with a dressing until it has healed. Clean the area with soap and water or as told by your health care provider.  You may shower if your health care provider approves. If your bandage gets wet, change it right away. Activity  Follow instructions from your health care provider about any activity limitations.  Do not drive for 24 hours if you received a medicine to help you relax (sedative). General instructions  Take over-the-counter and prescription medicines only as told by your health care provider.  Keep all follow-up visits as told by your health care provider. This is important. Contact a health care provider if:  You do not have a bowel movement for 2 days.  You have nausea or vomiting.  You have more redness, swelling, or pain around your treatment area.  You have more fluid or blood coming from your  treatment area.  Your treatment area feels warm to the touch.  You have pus or a bad smell coming from your treatment area.  You have a fever. Get help right away if:  You have severe pain.  You have trouble swallowing or breathing.  You have severe weakness or dizziness.  You have chest pain or shortness of breath. This information is not intended to replace advice given to you by your health care provider. Make sure you discuss any questions you have with your health care provider. Document Released: 10/20/2012 Document Revised: 07/20/2015 Document Reviewed: 05/30/2015 Elsevier Interactive Patient Education  2018 [employer] Seborrheic Keratosis Seborrheic keratosis is a common, noncancerous (benign) skin growth. This condition causes waxy, rough, tan, brown, or black spots to appear on the skin. These skin growths can be flat or raised. What are the causes? The cause of this condition is not known. What increases the risk? This condition is more likely to develop in:  People who have a family history of seborrheic keratosis.  People who are 5950 or older.  People who are pregnant.  People who have had estrogen replacement therapy.  What are the signs or symptoms? This condition often occurs on the face, chest, shoulders, back, or other areas. These growths:  Are usually painless, but may become irritated and itchy.  Can be yellow, brown, black, or other colors.  Are slightly raised or have a flat surface.  Are sometimes rough or wart-like in texture.  Are often waxy on the  surface.  Are round or oval-shaped.  Sometimes look like they are "stuck on."  Often occur in groups, but may occur as a single growth.  How is this diagnosed? This condition is diagnosed with a medical history and physical exam. A sample of the growth may be tested (skin biopsy). You may need to see a skin specialist (dermatologist). How is this treated? Treatment is not usually needed  for this condition, unless the growths are irritated or are often bleeding. You may also choose to have the growths removed if you do not like their appearance. Most commonly, these growths are treated with a procedure in which liquid nitrogen is applied to "freeze" off the growth (cryosurgery). They may also be burned off with electricity or cut off. Follow these instructions at home:  Watch your growth for any changes.  Keep all follow-up visits as told by your health care provider. This is important.  Do not scratch or pick at the growth or growths. This can cause them to become irritated or infected. Contact a health care provider if:  You suddenly have many new growths.  Your growth bleeds, itches, or hurts.  Your growth suddenly becomes larger or changes color. This information is not intended to replace advice given to you by your health care provider. Make sure you discuss any questions you have with your health care provider. Document Released: 02/01/2010 Document Revised: 06/07/2015 Document Reviewed: 05/17/2014 Elsevier Interactive Patient Education  2018 [employer].

## 2017-09-14 NOTE — Progress Notes (Signed)
BP 139/89   Pulse 76   Temp 97.9 F (36.6 C) (Oral)   Ht 5\' 10"  (1.778 m)   Wt 193 lb 3.2 oz (87.6 kg)   BMI 27.72 kg/m    Subjective:    Patient ID: Jacob Sanders, male    DOB: 09-20-1945, 72 y.o.   MRN: [id number]  HPI: Jacob Sanders is a 72 y.o. male presenting on 09/10/2017 for mole removal  Patient would like to have cryo treatment to a few of the seborrheic keratosis on him he has one on his left lateral side of the bottom of his rib cage into on his back just below each shoulder blade.  He states that they sometimes do get irritated.  They have never bled.  The one on the left he feels like it is starting to raise up and come a flap.  Past Medical History:  Diagnosis Date  . Arthritis   . GERD (gastroesophageal reflux disease)   . Hyperlipidemia   . Hypertension    Relevant past medical, surgical, family and social history reviewed and updated as indicated. Interim medical history since our last visit reviewed. Allergies and medications reviewed and updated. DATA REVIEWED: CHART IN EPIC  Family History reviewed for pertinent findings.  Review of Systems  Constitutional: Negative.  Negative for appetite change and fatigue.  Eyes: Negative for pain and visual disturbance.  Respiratory: Negative.  Negative for cough, chest tightness, shortness of breath and wheezing.   Cardiovascular: Negative.  Negative for chest pain, palpitations and leg swelling.  Gastrointestinal: Negative.  Negative for abdominal pain, diarrhea, nausea and vomiting.  Genitourinary: Negative.   Skin: Negative.  Negative for color change and rash.  Neurological: Negative.  Negative for weakness, numbness and headaches.  Psychiatric/Behavioral: Negative.     Allergies as of 09/10/2017      Reactions   Prednisone Swelling      Medication List        Accurate as of 09/10/17 11:59 PM. Always use your most recent med list.          diltiazem 240 MG 24 hr tablet Commonly known as:   CARDIZEM LA TAKE 1 TABLET BY MOUTH  DAILY   meclizine 25 MG tablet Commonly known as:  ANTIVERT TAKE 1 TABLET BY MOUTH 3  TIMES A DAY AS NEEDED FOR  DIZZINESS   omeprazole 20 MG capsule Commonly known as:  PRILOSEC TAKE 1 CAPSULE BY MOUTH  DAILY AS NEEDED          Objective:    BP 139/89   Pulse 76   Temp 97.9 F (36.6 C) (Oral)   Ht 5\' 10"  (1.778 m)   Wt 193 lb 3.2 oz (87.6 kg)   BMI 27.72 kg/m   Allergies  Allergen Reactions  . Prednisone Swelling    Wt Readings from Last 3 Encounters:  09/10/17 193 lb 3.2 oz (87.6 kg)  09/04/17 192 lb (87.1 kg)  04/27/17 191 lb 12.8 oz (87 kg)    Physical Exam  Constitutional: He appears well-developed and well-nourished. No distress.  HENT:  Head: Normocephalic and atraumatic.  Eyes: Pupils are equal, round, and reactive to light. Conjunctivae and EOM are normal.  Cardiovascular: Normal rate, regular rhythm and normal heart sounds.  Pulmonary/Chest: Effort normal and breath sounds normal. No respiratory distress.  Skin: Skin is warm and dry. Lesion and rash noted. Rash is papular.     Psychiatric: He has a normal mood and affect. His  behavior is normal.  Nursing note and vitals reviewed.       Assessment & Plan:   1. Seborrheic keratoses cryo desiccation to 3 seborrheic keratoses Cryo wound care given   Continue all other maintenance medications as listed above.  Follow up plan: Return in about 1 month (around 10/11/2017) for freezing lesions, 30 minutes please.  Educational handout given for wound care  Remus Loffler PA-C [facility] [address]  [place], [place] [address] [phone]   09/14/2017, 9:42 PM

## 2017-09-16 ENCOUNTER — Ambulatory Visit: Payer: Medicare Other | Admitting: *Deleted

## 2017-10-06 ENCOUNTER — Encounter: Payer: Self-pay | Admitting: Physician Assistant

## 2017-10-06 ENCOUNTER — Ambulatory Visit (INDEPENDENT_AMBULATORY_CARE_PROVIDER_SITE_OTHER): Payer: Medicare Other | Admitting: Physician Assistant

## 2017-10-06 VITALS — BP 149/93 | HR 80 | Temp 97.0°F | Ht 70.0 in | Wt 194.6 lb

## 2017-10-06 DIAGNOSIS — L821 Other seborrheic keratosis: Secondary | ICD-10-CM | POA: Diagnosis not present

## 2017-10-08 DIAGNOSIS — L821 Other seborrheic keratosis: Secondary | ICD-10-CM | POA: Insufficient documentation

## 2017-10-08 NOTE — Progress Notes (Signed)
BP (!) 149/93   Pulse 80   Temp (!) 97 F (36.1 C) (Oral)   Ht 5\' 10"  (1.778 m)   Wt 194 lb 9.6 oz (88.3 kg)   BMI 27.92 kg/m    Subjective:    Patient ID: MD SMOLA, male    DOB: [birth year], 72 y.o.   MRN: [id number]  HPI: Jacob Sanders is a 72 y.o. male presenting on 10/06/2017 for skin lesion removal   Patient would like to have the lesions frozen on his back.  They do get irritated.  Any hits the ones particularly on the side.  Past Medical History:  Diagnosis Date  . Arthritis   . GERD (gastroesophageal reflux disease)   . Hyperlipidemia   . Hypertension    Relevant past medical, surgical, family and social history reviewed and updated as indicated. Interim medical history since our last visit reviewed. Allergies and medications reviewed and updated. DATA REVIEWED: CHART IN EPIC  Family History reviewed for pertinent findings.  Review of Systems  Constitutional: Negative.  Negative for appetite change and fatigue.  Eyes: Negative for pain and visual disturbance.  Respiratory: Negative.  Negative for cough, chest tightness, shortness of breath and wheezing.   Cardiovascular: Negative.  Negative for chest pain, palpitations and leg swelling.  Gastrointestinal: Negative.  Negative for abdominal pain, diarrhea, nausea and vomiting.  Genitourinary: Negative.   Skin: Positive for wound. Negative for color change and rash.  Neurological: Negative.  Negative for weakness, numbness and headaches.  Psychiatric/Behavioral: Negative.     Allergies as of 10/06/2017      Reactions   Prednisone Swelling      Medication List        Accurate as of 10/06/17 11:59 PM. Always use your most recent med list.          diltiazem 240 MG 24 hr tablet Commonly known as:  CARDIZEM LA TAKE 1 TABLET BY MOUTH  DAILY   meclizine 25 MG tablet Commonly known as:  ANTIVERT TAKE 1 TABLET BY MOUTH 3  TIMES A DAY AS NEEDED FOR  DIZZINESS   omeprazole 20 MG capsule Commonly  known as:  PRILOSEC TAKE 1 CAPSULE BY MOUTH  DAILY AS NEEDED          Objective:    BP (!) 149/93   Pulse 80   Temp (!) 97 F (36.1 C) (Oral)   Ht 5\' 10"  (1.778 m)   Wt 194 lb 9.6 oz (88.3 kg)   BMI 27.92 kg/m   Allergies  Allergen Reactions  . Prednisone Swelling    Wt Readings from Last 3 Encounters:  10/06/17 194 lb 9.6 oz (88.3 kg)  09/10/17 193 lb 3.2 oz (87.6 kg)  09/04/17 192 lb (87.1 kg)    Physical Exam  Constitutional: He appears well-developed and well-nourished. No distress.  HENT:  Head: Normocephalic and atraumatic.  Eyes: Pupils are equal, round, and reactive to light. Conjunctivae and EOM are normal.  Cardiovascular: Normal rate, regular rhythm and normal heart sounds.  Pulmonary/Chest: Effort normal and breath sounds normal. No respiratory distress.  Skin: Skin is warm, dry and intact. Rash noted. Rash is maculopapular.     7 sebborheic keratoses on the trunk, treated with cryo  Psychiatric: He has a normal mood and affect. His behavior is normal.  Nursing note and vitals reviewed.       Assessment & Plan:   1. Seborrheic keratoses 7 different seborrheic keratoses are treated Skin is cleaned and dried  Cryo in a freeze-and-thaw method is performed to the 7 keratoses Patient tolerated well   Continue all other maintenance medications as listed above.  Follow up plan: Return in about 1 month (around 11/05/2017) for Thursday best,  30 minutes for skin.  Educational handout given for survey  Remus Loffler PA-C [facility] [address]  [place], [place] [address] [phone]   10/08/2017, 4:20 PM

## 2017-10-20 ENCOUNTER — Other Ambulatory Visit: Payer: Self-pay | Admitting: Family

## 2017-10-20 DIAGNOSIS — I4891 Unspecified atrial fibrillation: Secondary | ICD-10-CM

## 2017-11-18 ENCOUNTER — Other Ambulatory Visit: Payer: Self-pay | Admitting: Family

## 2017-11-19 ENCOUNTER — Ambulatory Visit (INDEPENDENT_AMBULATORY_CARE_PROVIDER_SITE_OTHER): Payer: Medicare Other | Admitting: Physician Assistant

## 2017-11-19 ENCOUNTER — Encounter: Payer: Self-pay | Admitting: Physician Assistant

## 2017-11-19 VITALS — BP 134/91 | HR 82 | Temp 97.7°F | Ht 70.0 in | Wt 193.8 lb

## 2017-11-19 DIAGNOSIS — L821 Other seborrheic keratosis: Secondary | ICD-10-CM

## 2017-11-23 NOTE — Progress Notes (Signed)
BP (!) 134/91   Pulse 82   Temp 97.7 F (36.5 C) (Oral)   Ht 5\' 10"  (1.778 m)   Wt 193 lb 12.8 oz (87.9 kg)   BMI 27.81 kg/m    Subjective:    Patient ID: Jacob Sanders, male    DOB: Oct 27, 1945, 72 y.o.   MRN: [id number]  HPI: Jacob Sanders is a 72 y.o. male presenting on 11/19/2017 for skin tag removal   Patient comes in for removal of more seborrheic keratoses.  They are benign in nature.  He has had good resolution of the other ones that we had performed before.  There is one that is on his left lateral side that has shrunk in size but not completely gone away.  Nasal couple on his back he would like to go ahead and start freezing now before they get to back.  Past Medical History:  Diagnosis Date  . Arthritis   . GERD (gastroesophageal reflux disease)   . Hyperlipidemia   . Hypertension    Relevant past medical, surgical, family and social history reviewed and updated as indicated. Interim medical history since our last visit reviewed. Allergies and medications reviewed and updated. DATA REVIEWED: CHART IN EPIC  Family History reviewed for pertinent findings.  Review of Systems  Constitutional: Negative.  Negative for appetite change and fatigue.  Eyes: Negative for pain and visual disturbance.  Respiratory: Negative.  Negative for cough, chest tightness, shortness of breath and wheezing.   Cardiovascular: Negative.  Negative for chest pain, palpitations and leg swelling.  Gastrointestinal: Negative.  Negative for abdominal pain, diarrhea, nausea and vomiting.  Genitourinary: Negative.   Skin: Positive for color change. Negative for rash.  Neurological: Negative.  Negative for weakness, numbness and headaches.  Psychiatric/Behavioral: Negative.     Allergies as of 11/19/2017      Reactions   Prednisone Swelling      Medication List        Accurate as of 11/19/17 11:59 PM. Always use your most recent med list.          diltiazem 240 MG 24 hr  tablet Commonly known as:  CARDIZEM LA TAKE 1 TABLET BY MOUTH  DAILY   meclizine 25 MG tablet Commonly known as:  ANTIVERT TAKE 1 TABLET BY MOUTH 3  TIMES A DAY AS NEEDED FOR  DIZZINESS   meclizine 25 MG tablet Commonly known as:  ANTIVERT TAKE 1 TABLET BY MOUTH 3  TIMES A DAY AS NEEDED FOR  DIZZINESS   omeprazole 20 MG capsule Commonly known as:  PRILOSEC TAKE 1 CAPSULE BY MOUTH  DAILY AS NEEDED          Objective:    BP (!) 134/91   Pulse 82   Temp 97.7 F (36.5 C) (Oral)   Ht 5\' 10"  (1.778 m)   Wt 193 lb 12.8 oz (87.9 kg)   BMI 27.81 kg/m   Allergies  Allergen Reactions  . Prednisone Swelling    Wt Readings from Last 3 Encounters:  11/19/17 193 lb 12.8 oz (87.9 kg)  10/06/17 194 lb 9.6 oz (88.3 kg)  09/10/17 193 lb 3.2 oz (87.6 kg)    Physical Exam  Constitutional: He appears well-developed and well-nourished. No distress.  HENT:  Head: Normocephalic and atraumatic.  Eyes: Pupils are equal, round, and reactive to light. Conjunctivae and EOM are normal.  Cardiovascular: Normal rate, regular rhythm and normal heart sounds.  Pulmonary/Chest: Effort normal and breath sounds normal. No respiratory  distress.  Skin: Skin is warm and dry. Lesion and rash noted. No purpura noted. Rash is maculopapular. Rash is not nodular and not vesicular. No erythema.     Multiple seborrheic keratoses present on the back, largest on left lateral side  Psychiatric: He has a normal mood and affect. His behavior is normal.  Nursing note and vitals reviewed.       Assessment & Plan:   1. Seborrheic keratoses  3 seborrheic keratosis lesions are treated with freeze-and-thaw method.  Patient tolerated well.  Wound care instructions are given.  Call back if any abnormalities occur.   Continue all other maintenance medications as listed above.  Follow up plan: No follow-ups on file.  Educational handout given for wound care  Remus Loffler PA-C [facility] [address]  [place], [place] [address] [phone]   11/23/2017, 5:23 PM

## 2018-03-11 ENCOUNTER — Telehealth: Payer: Self-pay | Admitting: Family

## 2018-03-27 ENCOUNTER — Other Ambulatory Visit: Payer: Self-pay | Admitting: Family

## 2018-03-27 DIAGNOSIS — I4891 Unspecified atrial fibrillation: Secondary | ICD-10-CM

## 2018-03-29 ENCOUNTER — Telehealth: Payer: Self-pay | Admitting: *Deleted

## 2018-03-29 NOTE — Telephone Encounter (Signed)
Patient's wife called stating that patient has had a cough, feeling lethargic, and not breathing normally for 5 days.  Denies respiratory distress.  They were in close proximity 2 weeks ago with someone who was tested for COVID 19 this morning.  Discussed with Dr. Nadine Counts and advised patient should do an E visit, and if patient is having any trouble breathing he should go to the ER for evaluation.  Patient's wife agreeable.  Sent activation code for Mychart to patient's email.

## 2018-03-30 ENCOUNTER — Telehealth: Payer: Self-pay | Admitting: *Deleted

## 2018-03-30 ENCOUNTER — Other Ambulatory Visit: Payer: Self-pay

## 2018-03-30 ENCOUNTER — Ambulatory Visit (INDEPENDENT_AMBULATORY_CARE_PROVIDER_SITE_OTHER): Payer: Medicare Other | Admitting: Family

## 2018-03-30 ENCOUNTER — Encounter: Payer: Self-pay | Admitting: Family

## 2018-03-30 VITALS — BP 171/110 | HR 86 | Temp 97.2°F | Ht 70.0 in | Wt 199.0 lb

## 2018-03-30 DIAGNOSIS — F17200 Nicotine dependence, unspecified, uncomplicated: Secondary | ICD-10-CM | POA: Diagnosis not present

## 2018-03-30 DIAGNOSIS — J410 Simple chronic bronchitis: Secondary | ICD-10-CM | POA: Diagnosis not present

## 2018-03-30 DIAGNOSIS — J301 Allergic rhinitis due to pollen: Secondary | ICD-10-CM | POA: Diagnosis not present

## 2018-03-30 MED ORDER — AZITHROMYCIN 250 MG PO TABS: ORAL_TABLET | ORAL | 0 refills | Status: DC

## 2018-03-30 MED ORDER — FLUTICASONE PROPIONATE 50 MCG/ACT NA SUSP: 2.0000 | Freq: Every day | NASAL | 6 refills | Status: DC

## 2018-03-30 MED ORDER — CETIRIZINE HCL 10 MG PO TABS: 10.0000 mg | ORAL_TABLET | Freq: Every day | ORAL | 11 refills | Status: DC

## 2018-03-30 NOTE — Progress Notes (Signed)
Subjective:    Patient ID: Jacob Sanders, male    DOB: Jul 13, 1945, 73 y.o.   MRN: [id number]  Chief Complaint  Patient presents with  . Cough    had for three weeks  . Sinus Problem    Cough  This is a new problem. The current episode started 1 to 4 weeks ago. The problem has been gradually improving. The problem occurs every few minutes. The cough is productive of sputum and productive of purulent sputum. Associated symptoms include nasal congestion, a sore throat and shortness of breath. Pertinent negatives include no chills, ear congestion, ear pain, fever, headaches, myalgias ("other than my arthritis"), postnasal drip, rhinorrhea or wheezing. Associated symptoms comments: Sinus pressure . The symptoms are aggravated by lying down. Risk factors for lung disease include smoking/tobacco exposure. He has tried rest and OTC cough suppressant for the symptoms. The treatment provided mild relief. His past medical history is significant for COPD.  Sinus Problem  Associated symptoms include coughing, shortness of breath and a sore throat. Pertinent negatives include no chills, ear pain or headaches.      Review of Systems  Constitutional: Negative for chills and fever.  HENT: Positive for sore throat. Negative for ear pain, postnasal drip and rhinorrhea.   Respiratory: Positive for cough and shortness of breath. Negative for wheezing.   Musculoskeletal: Negative for myalgias ("other than my arthritis").  Neurological: Negative for headaches.  All other systems reviewed and are negative.      Objective:   Physical Exam Vitals signs reviewed.  Constitutional:      General: He is not in acute distress.    Appearance: He is well-developed.  HENT:     Head: Normocephalic.     Right Ear: Tympanic membrane normal.     Left Ear: Tympanic membrane normal.     Nose: Mucosal edema present.     Mouth/Throat:     Pharynx: Posterior oropharyngeal erythema present.  Eyes:     General:         Right eye: No discharge.        Left eye: No discharge.     Pupils: Pupils are equal, round, and reactive to light.     Comments: Bilateral eye lids erythemas  Neck:     Musculoskeletal: Normal range of motion and neck supple.     Thyroid: No thyromegaly.  Cardiovascular:     Rate and Rhythm: Normal rate and regular rhythm.     Heart sounds: Normal heart sounds. No murmur.  Pulmonary:     Effort: Pulmonary effort is normal. No respiratory distress.     Breath sounds: Normal breath sounds. No wheezing.  Abdominal:     General: Bowel sounds are normal. There is no distension.     Palpations: Abdomen is soft.     Tenderness: There is no abdominal tenderness.  Musculoskeletal: Normal range of motion.        General: No tenderness.  Skin:    General: Skin is warm and dry.     Findings: No erythema or rash.  Neurological:     Mental Status: He is alert and oriented to person, place, and time.     Cranial Nerves: No cranial nerve deficit.     Deep Tendon Reflexes: Reflexes are normal and symmetric.  Psychiatric:        Behavior: Behavior normal.        Thought Content: Thought content normal.        Judgment: Judgment normal.  BP (!) 171/110   Pulse 86   Temp (!) 97.2 F (36.2 C) (Oral)   Ht 5\' 10"  (1.778 m)   Wt 199 lb (90.3 kg)   SpO2 98%   BMI 28.55 kg/m      Assessment & Plan:  Jacob Sanders comes in today with chief complaint of Cough (had for three weeks) and Sinus Problem   Diagnosis and orders addressed:  1. Allergic rhinitis due to pollen, unspecified seasonality Start daily zyrtec and flonase Avoid allergens when possible  - cetirizine (ZYRTEC) 10 MG tablet; Take 1 tablet (10 mg total) by mouth daily.  Dispense: 30 tablet; Refill: 11 - fluticasone (FLONASE) 50 MCG/ACT nasal spray; Place 2 sprays into both nostrils daily.  Dispense: 16 g; Refill: 6  2. Simple chronic bronchitis (HCC) Can not tolerate steroids, will do zpak - Take meds as  prescribed - Use a cool mist humidifier  -Use saline nose sprays frequently -Force fluids -For any cough or congestion  Use plain Mucinex- regular strength or max strength is fine -For fever or aces or pains- take tylenol or ibuprofen. -RTO if symptoms worsen or do not improve  - azithromycin (ZITHROMAX) 250 MG tablet; Take 500 mg once, then 250 mg for four days  Dispense: 6 tablet; Refill: 0  3. Current smoker Smoking cessation discussed    Jannifer Rodney, FNP

## 2018-03-30 NOTE — Patient Instructions (Signed)

## 2018-03-30 NOTE — Telephone Encounter (Signed)
Discussed patient with Jannifer Rodney, FNP today.  She states she will see patient here this morning.  The patient is well known to her and she suspects COPD exacerbation.  States he should wear a mask upon entering the building.  Attempted to contact patient 3 times via phone, no answer, no voicemail available.

## 2018-04-21 ENCOUNTER — Other Ambulatory Visit: Payer: Self-pay

## 2018-04-21 ENCOUNTER — Ambulatory Visit (INDEPENDENT_AMBULATORY_CARE_PROVIDER_SITE_OTHER): Payer: Medicare Other | Admitting: Family Medicine

## 2018-04-21 ENCOUNTER — Encounter: Payer: Self-pay | Admitting: Family Medicine

## 2018-04-21 DIAGNOSIS — R059 Cough, unspecified: Secondary | ICD-10-CM

## 2018-04-21 DIAGNOSIS — R05 Cough: Secondary | ICD-10-CM

## 2018-04-21 DIAGNOSIS — N3001 Acute cystitis with hematuria: Secondary | ICD-10-CM | POA: Diagnosis not present

## 2018-04-21 MED ORDER — CEPHALEXIN 500 MG PO CAPS: 500.0000 mg | ORAL_CAPSULE | Freq: Two times a day (BID) | ORAL | 0 refills | Status: AC

## 2018-04-21 NOTE — Progress Notes (Signed)
Telephone visit  Subjective: Jacob Sanders, UTI PCP: Junie Spencer, FNP [id number] Jacob Sanders is a 73 y.o. male calls for telephone consult today. Patient provides verbal consent for consult held via phone.  Location of patient: home Location of provider: [facility] Others present for call: wife  1. Dysuria Patient with a 1 day history of dysuria, scant hematuria and cloudy urine.  He reports no change in urgency or frequency.  He has a history of microscopic hematuria that was evaluated by urology previously.  He has not had issues with this in some time since being off of his anticoagulation, which she was on for atrial fibrillation.  No fevers, nausea, vomiting or abdominal pain.  2.  Cough Patient was seen in March for cough that was thought to be related to chronic bronchitis and allergic rhinitis.  He was prescribed Zyrtec and Flonase and reports some use of this.  He denies any hemoptysis, purulent discharge from the nares, fevers, shortness of breath.  Cough is not severe per his report.  It is productive.  He is wondering if he would benefit from antibiotics again.   ROS: Per HPI  Allergies  Allergen Reactions  . Prednisone Swelling   Past Medical History:  Diagnosis Date  . Arthritis   . GERD (gastroesophageal reflux disease)   . Hyperlipidemia   . Hypertension     Current Outpatient Medications:  .  cetirizine (ZYRTEC) 10 MG tablet, Take 1 tablet (10 mg total) by mouth daily., Disp: 30 tablet, Rfl: 11 .  diltiazem (CARDIZEM LA) 240 MG 24 hr tablet, Take 1 tablet (240 mg total) by mouth daily. (Needs to be seen before next refill for regular med ckup), Disp: 90 tablet, Rfl: 0 .  fluticasone (FLONASE) 50 MCG/ACT nasal spray, Place 2 sprays into both nostrils daily., Disp: 16 g, Rfl: 6 .  omeprazole (PRILOSEC) 20 MG capsule, Take 1 capsule (20 mg total) by mouth daily as needed. (Needs to be seen before next refill for regular med ckup), Disp: 90 capsule, Rfl: 0 .  TERBINAFINE HCL  PO, , Disp: , Rfl:   Assessment/ Plan: 73 y.o. male   1. Acute cystitis with hematuria Symptoms seem consistent with urinary tract infection.  I sent in Keflex p.o. twice daily for the next 7 days.  We discussed reasons for emergent evaluation emergency department.  Push oral fluids. - cephALEXin (KEFLEX) 500 MG capsule; Take 1 capsule (500 mg total) by mouth 2 (two) times daily for 7 days.  Dispense: 14 capsule; Refill: 0  2. Cough Likely related to postnasal drip in the setting of allergies.  I encouraged him to use his Zyrtec and Flonase as prescribed.  Oral antibiotics as above for UTI.   Start time: 1:51pm End time: 1:58pm  Total time spent on patient care (including telephone call/ virtual visit): 15 minutes   Hulen Skains, DO [facility] [phone]

## 2018-05-06 ENCOUNTER — Ambulatory Visit (INDEPENDENT_AMBULATORY_CARE_PROVIDER_SITE_OTHER): Payer: Medicare Other | Admitting: Family Medicine

## 2018-05-06 ENCOUNTER — Other Ambulatory Visit: Payer: Self-pay

## 2018-05-06 ENCOUNTER — Encounter: Payer: Self-pay | Admitting: Family Medicine

## 2018-05-06 DIAGNOSIS — J0100 Acute maxillary sinusitis, unspecified: Secondary | ICD-10-CM | POA: Diagnosis not present

## 2018-05-06 MED ORDER — AMOXICILLIN-POT CLAVULANATE 875-125 MG PO TABS: 1.0000 | ORAL_TABLET | Freq: Two times a day (BID) | ORAL | 0 refills | Status: AC

## 2018-05-06 NOTE — Progress Notes (Signed)
Virtual Visit via telephone Note Due to COVID-19, visit is conducted virtually and was requested by patient. This visit type was conducted due to national recommendations for restrictions regarding the COVID-19 Pandemic (e.g. social distancing) in an effort to limit this patient's exposure and mitigate transmission in our community.  Due to her co-morbid illnesses, this patient is at least at moderate risk for complications without adequate follow up.  This format is felt to be most appropriate for this patient at this time.  All issues noted in this document were discussed and addressed.  A physical exam was not performed with this format.   I connected with Jacob Sanders on 05/06/18 at 1305 by telephone and verified that I am speaking with the correct person using two identifiers. Jacob Sanders is currently located at home and wife is currently with them during visit. The provider, Kari Baars, FNP is located in their office at time of visit.  I discussed the limitations, risks, security and privacy concerns of performing an evaluation and management service by telephone and the availability of in person appointments. I also discussed with the patient that there may be a patient responsible charge related to this service. The patient expressed understanding and agreed to proceed.  Subjective:  Patient ID: Jacob Sanders, male    DOB: [birth year], 73 y.o.   MRN: [id number]  Chief Complaint:  Sinus Problem   HPI: Jacob Sanders is a 73 y.o. male presenting on 05/06/2018 for Sinus Problem   Pt reports 5 weeks of cough, nasal drainage, maxillary sinus pressure, cervical lymphedema, chills, fatigue, congestion, and sore throat. Pt states he has been using Zyrtec and Flonase without relief of these symptoms. States they are getting worse. States the pressure is 6/10 at worst. No documented fever. No shortness of breath, recent travel, or known sick exposures.    Relevant past medical,  surgical, family, and social history reviewed and updated as indicated.  Allergies and medications reviewed and updated.   Past Medical History:  Diagnosis Date   Arthritis    GERD (gastroesophageal reflux disease)    Hyperlipidemia    Hypertension     Past Surgical History:  Procedure Laterality Date   SPINE SURGERY     Resulting staph infection   TONSILLECTOMY     childhood    TOOTH EXTRACTION  3 weeks ago ;   had abcess beneath tooth; experiened some bleeding for 20 hours post op   TOTAL HIP ARTHROPLASTY Left 05/22/2016   Procedure: LEFT TOTAL HIP ARTHROPLASTY ANTERIOR APPROACH;  Surgeon: Samson Frederic, MD;  Location: [facility];  Service: Orthopedics;  Laterality: Left;  Dr. requesting RNFA   TOTAL HIP ARTHROPLASTY Right 07/17/2016   Procedure: RIGHT TOTAL HIP ARTHROPLASTY ANTERIOR APPROACH;  Surgeon: Samson Frederic, MD;  Location: [facility];  Service: Orthopedics;  Laterality: Right;  needs RNFA    Social History   Socioeconomic History   Marital status: Married    Spouse name: Not on file   Number of children: Not on file   Years of education: Not on file   Highest education level: Not on file  Occupational History   Not on file  Social Needs   Financial resource strain: Not on file   Food insecurity:    Worry: Not on file    Inability: Not on file   Transportation needs:    Medical: Not on file    Non-medical: Not on file  Tobacco Use   Smoking status: Current  Every Day Smoker    Packs/day: 1.00    Years: 30.00    Pack years: 30.00    Types: Cigars   Smokeless tobacco: Never Used  Substance and Sexual Activity   Alcohol use: No   Drug use: No   Sexual activity: Not on file  Lifestyle   Physical activity:    Days per week: Not on file    Minutes per session: Not on file   Stress: Not on file  Relationships   Social connections:    Talks on phone: Not on file    Gets together: Not on file    Attends religious service: Not on file      Active member of club or organization: Not on file    Attends meetings of clubs or organizations: Not on file    Relationship status: Not on file   Intimate partner violence:    Fear of current or ex partner: Not on file    Emotionally abused: Not on file    Physically abused: Not on file    Forced sexual activity: Not on file  Other Topics Concern   Not on file  Social History Narrative   Not on file    Outpatient Encounter Medications as of 05/06/2018  Medication Sig   amoxicillin-clavulanate (AUGMENTIN) 875-125 MG tablet Take 1 tablet by mouth 2 (two) times daily for 7 days.   cetirizine (ZYRTEC) 10 MG tablet Take 1 tablet (10 mg total) by mouth daily.   diltiazem (CARDIZEM LA) 240 MG 24 hr tablet Take 1 tablet (240 mg total) by mouth daily. (Needs to be seen before next refill for regular med ckup)   fluticasone (FLONASE) 50 MCG/ACT nasal spray Place 2 sprays into both nostrils daily.   omeprazole (PRILOSEC) 20 MG capsule Take 1 capsule (20 mg total) by mouth daily as needed. (Needs to be seen before next refill for regular med ckup)   TERBINAFINE HCL PO    No facility-administered encounter medications on file as of 05/06/2018.     Allergies  Allergen Reactions   Prednisone Swelling    Review of Systems  Constitutional: Positive for chills and fatigue. Negative for activity change, appetite change, fever and unexpected weight change.  HENT: Positive for congestion, postnasal drip, rhinorrhea, sinus pressure, sinus pain and sore throat.   Respiratory: Positive for cough. Negative for chest tightness and shortness of breath.   Cardiovascular: Negative for chest pain and palpitations.  Musculoskeletal: Negative for arthralgias and myalgias.  Skin: Negative for color change, pallor and rash.  Neurological: Positive for headaches. Negative for dizziness, weakness and light-headedness.  Hematological: Positive for adenopathy (cervical).  Psychiatric/Behavioral:  Negative for confusion.  All other systems reviewed and are negative.        Observations/Objective: No vital signs or physical exam, this was a telephone or virtual health encounter.  Pt alert and oriented, answers all questions appropriately, and able to speak in full sentences.    Assessment and Plan: [name] was seen today for sinus problem.  Diagnoses and all orders for this visit:  Acute non-recurrent maxillary sinusitis Due to length of symptoms and failed conservative therapy, will treat with Augmentin. Continue Zyrtec and Flonase. Frequent saline nasal sprays. Can try EchoStareti Pot. Report any new or worsening symptoms. Medications as prescribed.  -     amoxicillin-clavulanate (AUGMENTIN) 875-125 MG tablet; Take 1 tablet by mouth 2 (two) times daily for 7 days.     Follow Up Instructions: Return if symptoms worsen or  fail to improve.    I discussed the assessment and treatment plan with the patient. The patient was provided an opportunity to ask questions and all were answered. The patient agreed with the plan and demonstrated an understanding of the instructions.   The patient was advised to call back or seek an in-person evaluation if the symptoms worsen or if the condition fails to improve as anticipated.  The above assessment and management plan was discussed with the patient. The patient verbalized understanding of and has agreed to the management plan. Patient is aware to call the clinic if symptoms persist or worsen. Patient is aware when to return to the clinic for a follow-up visit. Patient educated on when it is appropriate to go to the emergency department.    I provided 15 minutes of non-face-to-face time during this encounter. The call started at 1305. The call ended at 1320.   Kari Baars, FNP-C [facility] [address] [place], [place] [address] [phone]

## 2018-05-21 ENCOUNTER — Other Ambulatory Visit: Payer: Self-pay

## 2018-05-21 ENCOUNTER — Ambulatory Visit (INDEPENDENT_AMBULATORY_CARE_PROVIDER_SITE_OTHER): Payer: Medicare Other | Admitting: *Deleted

## 2018-05-21 DIAGNOSIS — Z0000 Encounter for general adult medical examination without abnormal findings: Secondary | ICD-10-CM | POA: Diagnosis not present

## 2018-05-21 NOTE — Patient Instructions (Signed)
Preventive Care 2 Years and Older, Male Preventive care refers to lifestyle choices and visits with your health care provider that can promote health and wellness. What does preventive care include?   A yearly physical exam. This is also called an annual well check.  Dental exams once or twice a year.  Routine eye exams. Ask your health care provider how often you should have your eyes checked.  Personal lifestyle choices, including: ? Daily care of your teeth and gums. ? Regular physical activity. ? Eating a healthy diet. ? Avoiding tobacco and drug use. ? Limiting alcohol use. ? Practicing safe sex. ? Taking low doses of aspirin every day. ? Taking vitamin and mineral supplements as recommended by your health care provider. What happens during an annual well check? The services and screenings done by your health care provider during your annual well check will depend on your age, overall health, lifestyle risk factors, and family history of disease. Counseling Your health care provider may ask you questions about your:  Alcohol use.  Tobacco use.  Drug use.  Emotional well-being.  Home and relationship well-being.  Sexual activity.  Eating habits.  History of falls.  Memory and ability to understand (cognition).  Work and work Statistician. Screening You may have the following tests or measurements:  Height, weight, and BMI.  Blood pressure.  Lipid and cholesterol levels. These may be checked every 5 years, or more frequently if you are over 9 years old.  Skin check.  Lung cancer screening. You may have this screening every year starting at age 57 if you have a 30-pack-year history of smoking and currently smoke or have quit within the past 15 years.  Colorectal cancer screening. All adults should have this screening starting at age 90 and continuing until age 69. You will have tests every 1-10 years, depending on your results and the type of screening  test. People at increased risk should start screening at an earlier age. Screening tests may include: ? Guaiac-based fecal occult blood testing. ? Fecal immunochemical test (FIT). ? Stool DNA test. ? Virtual colonoscopy. ? Sigmoidoscopy. During this test, a flexible tube with a tiny camera (sigmoidoscope) is used to examine your rectum and lower colon. The sigmoidoscope is inserted through your anus into your rectum and lower colon. ? Colonoscopy. During this test, a long, thin, flexible tube with a tiny camera (colonoscope) is used to examine your entire colon and rectum.  Prostate cancer screening. Recommendations will vary depending on your family history and other risks.  Hepatitis C blood test.  Hepatitis B blood test.  Sexually transmitted disease (STD) testing.  Diabetes screening. This is done by checking your blood sugar (glucose) after you have not eaten for a while (fasting). You may have this done every 1-3 years.  Abdominal aortic aneurysm (AAA) screening. You may need this if you are a current or former smoker.  Osteoporosis. You may be screened starting at age 30 if you are at high risk. Talk with your health care provider about your test results, treatment options, and if necessary, the need for more tests. Vaccines Your health care provider may recommend certain vaccines, such as:  Influenza vaccine. This is recommended every year.  Tetanus, diphtheria, and acellular pertussis (Tdap, Td) vaccine. You may need a Td booster every 10 years.  Varicella vaccine. You may need this if you have not been vaccinated.  Zoster vaccine. You may need this after age 42.  Measles, mumps, and rubella (MMR) vaccine.  You may need at least one dose of MMR if you were born in 1957 or later. You may also need a second dose.  Pneumococcal 13-valent conjugate (PCV13) vaccine. One dose is recommended after age 65.  Pneumococcal polysaccharide (PPSV23) vaccine. One dose is recommended  after age 65.  Meningococcal vaccine. You may need this if you have certain conditions.  Hepatitis A vaccine. You may need this if you have certain conditions or if you travel or work in places where you may be exposed to hepatitis A.  Hepatitis B vaccine. You may need this if you have certain conditions or if you travel or work in places where you may be exposed to hepatitis B.  Haemophilus influenzae type b (Hib) vaccine. You may need this if you have certain risk factors. Talk to your health care provider about which screenings and vaccines you need and how often you need them. This information is not intended to replace advice given to you by your health care provider. Make sure you discuss any questions you have with your health care provider. Document Released: 01/26/2015 Document Revised: 02/19/2017 Document Reviewed: 10/31/2014 Elsevier Interactive Patient Education  2019 Elsevier Inc.  

## 2018-05-21 NOTE — Progress Notes (Signed)
MEDICARE ANNUAL WELLNESS VISIT  05/21/2018  Telephone Visit Disclaimer This Medicare AWV was conducted by telephone due to national recommendations for restrictions regarding the COVID-19 Pandemic (e.g. social distancing).  I verified, using two identifiers, that I am speaking with Jacob Sanders or their authorized healthcare agent. I discussed the limitations, risks, security, and privacy concerns of performing an evaluation and management service by telephone and the potential availability of an in-person appointment in the future. The patient expressed understanding and agreed to proceed.   Subjective:  Jacob Sanders is a 73 y.o. male patient of Hawks, Edilia Bohristy A, FNP who had a Medicare Annual Wellness Visit today via telephone. [name] is Retired and lives with their spouse. he has 3 children. he reports that he is socially active and does interact with friends/family regularly. he is minimally physically active and enjoys dancing.  Patient Care Team: Junie SpencerHawks, Christy A, FNP as PCP - General (Nurse Practitioner)  Advanced Directives 05/21/2018 07/18/2016 07/17/2016 07/17/2016 05/22/2016 03/21/2016  Does Patient Have a Medical Advance Directive? No No No No No Yes  Type of Advance Directive - - - - - MidwifeHealthcare Power of Attorney;Living will  Does patient want to make changes to medical advance directive? - - - - - No - Patient declined  Copy of Healthcare Power of Attorney in Chart? - - - - - No - copy requested  Would patient like information on creating a medical advance directive? No - Patient declined No - Patient declined No - Patient declined No - Patient declined No - Patient declined -    Hospital Utilization Over the Past 12 Months: # of hospitalizations or ER visits: 0 # of surgeries: 0  Review of Systems    Patient reports that his overall health is better compared to last year.  Patient Reported Readings (BP, Pulse, CBG, Weight, etc) none  Review of Systems: Musculoskeletal  ROS: positive for - joint stiffness  All other systems negative.  Pain Assessment Pain : 0-10 Pain Score: 4  Pain Type: Chronic pain Pain Location: Other (Comment)(hurts all over) Pain Orientation: Other (Comment) Pain Descriptors / Indicators: Aching, Sore Pain Frequency: Constant Pain Relieving Factors: pt says he just deals with it  Pain Relieving Factors: pt says he just deals with it  Current Medications & Allergies (verified) Allergies as of 05/21/2018      Reactions   Prednisone Swelling      Medication List       Accurate as of May 21, 2018  1:58 PM. If you have any questions, ask your nurse or doctor.        cetirizine 10 MG tablet Commonly known as:  ZYRTEC Take 1 tablet (10 mg total) by mouth daily.   diltiazem 240 MG 24 hr tablet Commonly known as:  CARDIZEM LA Take 1 tablet (240 mg total) by mouth daily. (Needs to be seen before next refill for regular med ckup)   fluticasone 50 MCG/ACT nasal spray Commonly known as:  FLONASE Place 2 sprays into both nostrils daily.   meclizine 25 MG tablet Commonly known as:  ANTIVERT Take 25 mg by mouth 3 (three) times daily as needed for dizziness.   omeprazole 20 MG capsule Commonly known as:  PRILOSEC Take 1 capsule (20 mg total) by mouth daily as needed. (Needs to be seen before next refill for regular med ckup)   TERBINAFINE HCL PO Take by mouth. What changed:  Another medication with the same name was removed. Continue  taking this medication, and follow the directions you see here.       History (reviewed): Past Medical History:  Diagnosis Date  . Arthritis   . GERD (gastroesophageal reflux disease)   . Hyperlipidemia   . Hypertension    Past Surgical History:  Procedure Laterality Date  . HAND SURGERY Left 1995  . SPINE SURGERY     Resulting staph infection  . TONSILLECTOMY     childhood   . TOOTH EXTRACTION  3 weeks ago ;   had abcess beneath tooth; experiened some bleeding for 20 hours post  op  . TOTAL HIP ARTHROPLASTY Left 05/22/2016   Procedure: LEFT TOTAL HIP ARTHROPLASTY ANTERIOR APPROACH;  Surgeon: Samson Frederic, MD;  Location: [facility];  Service: Orthopedics;  Laterality: Left;  Dr. requesting RNFA  . TOTAL HIP ARTHROPLASTY Right 07/17/2016   Procedure: RIGHT TOTAL HIP ARTHROPLASTY ANTERIOR APPROACH;  Surgeon: Samson Frederic, MD;  Location: [facility];  Service: Orthopedics;  Laterality: Right;  needs RNFA   Family History  Problem Relation Age of Onset  . Hypertension Mother   . Aneurysm Father   . Hypertension Father    Social History   Socioeconomic History  . Marital status: Married    Spouse name: Steffanie Dunn  . Number of children: 3  . Years of education: 10  . Highest education level: 10th grade  Occupational History  . Occupation: Retired  Engineer, production  . Financial resource strain: Not hard at all  . Food insecurity:    Worry: Never true    Inability: Never true  . Transportation needs:    Medical: No    Non-medical: No  Tobacco Use  . Smoking status: Current Every Day Smoker    Packs/day: 1.00    Years: 30.00    Pack years: 30.00    Types: Cigars  . Smokeless tobacco: Never Used  Substance and Sexual Activity  . Alcohol use: No  . Drug use: No  . Sexual activity: Not Currently  Lifestyle  . Physical activity:    Days per week: 0 days    Minutes per session: 0 min  . Stress: Not at all  Relationships  . Social connections:    Talks on phone: More than three times a week    Gets together: Three times a week    Attends religious service: More than 4 times per year    Active member of club or organization: Yes    Attends meetings of clubs or organizations: More than 4 times per year    Relationship status: Married  Other Topics Concern  . Not on file  Social History Narrative  . Not on file    Activities of Daily Living In your present state of health, do you have any difficulty performing the following activities: 05/21/2018  Hearing? N   Vision? N  Difficulty concentrating or making decisions? N  Walking or climbing stairs? N  Dressing or bathing? N  Doing errands, shopping? N  Preparing Food and eating ? N  Using the Toilet? N  In the past six months, have you accidently leaked urine? Y  Comment If he holds his urine too long   Do you have problems with loss of bowel control? N  Managing your Medications? N  Managing your Finances? N  Housekeeping or managing your Housekeeping? N  Some recent data might be hidden    Patient Literacy How often do you need to have someone help you when you read instructions, pamphlets,  or other written materials from your doctor or pharmacy?: 1 - Never What is the last grade level you completed in school?: 10th  Exercise Current Exercise Habits: The patient does not participate in regular exercise at present, Exercise limited by: orthopedic condition(s)  Diet Patient reports consuming 1 meals a day and 4 snack(s) a day Patient reports that his primary diet is: Regular Patient reports that she does have regular access to food.   Depression Screen PHQ 2/9 Scores 05/21/2018 05/06/2018 03/30/2018 11/19/2017 10/06/2017 09/10/2017 09/04/2017  PHQ - 2 Score 0 0 0 0 0 0 0     Fall Risk Fall Risk  05/21/2018 03/30/2018 11/19/2017 10/06/2017 09/10/2017  Falls in the past year? 0 0 0 No No  Number falls in past yr: - - - - -  Injury with Fall? - - - - -     Objective:  Jacob Sanders seemed alert and oriented and he participated appropriately during our telephone visit.  Blood Pressure Weight BMI  BP Readings from Last 3 Encounters:  03/30/18 (!) 171/110  11/19/17 (!) 134/91  10/06/17 (!) 149/93   Wt Readings from Last 3 Encounters:  03/30/18 199 lb (90.3 kg)  11/19/17 193 lb 12.8 oz (87.9 kg)  10/06/17 194 lb 9.6 oz (88.3 kg)   BMI Readings from Last 1 Encounters:  03/30/18 28.55 kg/m    *Unable to obtain current vital signs, weight, and BMI due to telephone visit type   Hearing/Vision  . Draden did not seem to have difficulty with hearing/understanding during the telephone conversation . Reports that he has not had a formal eye exam by an eye care professional within the past year . Reports that he has not had a formal hearing evaluation within the past year *Unable to fully assess hearing and vision during telephone visit type  Cognitive Function: 6CIT Screen 05/21/2018  What Year? 0 points  What month? 0 points  What time? 0 points  Count back from 20 0 points  Months in reverse 0 points  Repeat phrase 0 points  Total Score 0    Normal Cognitive Function Screening: Yes (Normal:0-7, Significant for Dysfunction: >8)  Immunization & Health Maintenance Record  There is no immunization history on file for this patient.  Health Maintenance  Topic Date Due  . COLONOSCOPY  07/29/1997  . COLON CANCER SCREENING ANNUAL FOBT  03/27/2017  . PNA vac Low Risk Adult (1 of 2 - PCV13) 08/21/2028 (Originally 07/29/2012)  . INFLUENZA VACCINE  08/14/2018  . TETANUS/TDAP  01/13/2021  . Hepatitis C Screening  Completed       Assessment  This is a routine wellness examination for Jacob Sanders.  Health Maintenance: Due or Overdue Health Maintenance Due  Topic Date Due  . COLONOSCOPY  07/29/1997  . COLON CANCER SCREENING ANNUAL FOBT  03/27/2017    Jacob Sanders does not need a referral for Community Assistance: Care Management:   no Social Work:    no Prescription Assistance:  no Nutrition/Diabetes Education:  no   Plan:  Personalized Goals Goals Addressed            This Visit's Progress   . Have 3 meals a day        Personalized Health Maintenance & Screening Recommendations  pt declines all vaccines and colon screening  Lung Cancer Screening Recommended: yes but pt declined (Low Dose CT Chest recommended if Age 24-80 years, 30 pack-year currently smoking OR have quit w/in past 15 years) Hepatitis  C Screening recommended: no HIV  Screening recommended: no  Advanced Directives: Written information was not prepared per patient's request.  Referrals & Orders No orders of the defined types were placed in this encounter.   Follow-up Plan . Follow-up with Junie Spencer, FNP as planned   I have personally reviewed and noted the following in the patient's chart:   . Medical and social history . Use of alcohol, tobacco or illicit drugs  . Current medications and supplements . Functional ability and status . Nutritional status . Physical activity . Advanced directives . List of other physicians . Hospitalizations, surgeries, and ER visits in previous 12 months . Vitals . Screenings to include cognitive, depression, and falls . Referrals and appointments  In addition, I have reviewed and discussed with Jacob Sanders certain preventive protocols, quality metrics, and best practice recommendations. A written personalized care plan for preventive services as well as general preventive health recommendations is available and can be mailed to the patient at his request.      signature  05/21/2018

## 2018-05-24 ENCOUNTER — Other Ambulatory Visit: Payer: Self-pay | Admitting: Family

## 2018-05-24 DIAGNOSIS — I4891 Unspecified atrial fibrillation: Secondary | ICD-10-CM

## 2018-05-26 NOTE — Telephone Encounter (Signed)
Appointment scheduled to get refills.

## 2018-05-26 NOTE — Telephone Encounter (Signed)
Last refills given 03/29/18 with 90 day supply-ntbs for further refills.

## 2018-05-27 ENCOUNTER — Telehealth: Payer: Self-pay | Admitting: Family

## 2018-05-27 ENCOUNTER — Other Ambulatory Visit: Payer: Self-pay

## 2018-05-27 NOTE — Telephone Encounter (Signed)
Appt changed to televisit

## 2018-05-28 ENCOUNTER — Ambulatory Visit (INDEPENDENT_AMBULATORY_CARE_PROVIDER_SITE_OTHER): Payer: Medicare Other | Admitting: Family

## 2018-05-28 ENCOUNTER — Encounter: Payer: Self-pay | Admitting: Family

## 2018-05-28 DIAGNOSIS — K219 Gastro-esophageal reflux disease without esophagitis: Secondary | ICD-10-CM | POA: Diagnosis not present

## 2018-05-28 DIAGNOSIS — I4891 Unspecified atrial fibrillation: Secondary | ICD-10-CM

## 2018-05-28 DIAGNOSIS — G2581 Restless legs syndrome: Secondary | ICD-10-CM | POA: Insufficient documentation

## 2018-05-28 DIAGNOSIS — E663 Overweight: Secondary | ICD-10-CM

## 2018-05-28 DIAGNOSIS — M1990 Unspecified osteoarthritis, unspecified site: Secondary | ICD-10-CM

## 2018-05-28 DIAGNOSIS — F17200 Nicotine dependence, unspecified, uncomplicated: Secondary | ICD-10-CM | POA: Diagnosis not present

## 2018-05-28 DIAGNOSIS — I10 Essential (primary) hypertension: Secondary | ICD-10-CM

## 2018-05-28 MED ORDER — OMEPRAZOLE 20 MG PO CPDR: 20.0000 mg | DELAYED_RELEASE_CAPSULE | Freq: Every day | ORAL | 0 refills | Status: DC | PRN

## 2018-05-28 MED ORDER — DILTIAZEM HCL ER COATED BEADS 240 MG PO TB24: 240.0000 mg | ORAL_TABLET | Freq: Every day | ORAL | 0 refills | Status: DC

## 2018-05-28 MED ORDER — ALBUTEROL SULFATE HFA 108 (90 BASE) MCG/ACT IN AERS: 2.0000 | INHALATION_SPRAY | Freq: Four times a day (QID) | RESPIRATORY_TRACT | 2 refills | Status: DC | PRN

## 2018-05-28 MED ORDER — MECLIZINE HCL 25 MG PO TABS: 25.0000 mg | ORAL_TABLET | Freq: Three times a day (TID) | ORAL | 1 refills | Status: DC | PRN

## 2018-05-28 MED ORDER — PRAMIPEXOLE DIHYDROCHLORIDE 0.25 MG PO TABS: 0.2500 mg | ORAL_TABLET | Freq: Every day | ORAL | 1 refills | Status: DC

## 2018-05-28 NOTE — Progress Notes (Signed)
Virtual Visit via telephone Note  I connected with Jacob Sanders on 05/28/18 at 12:11 AM by telephone and verified that I am speaking with the correct person using two identifiers. Jacob Sanders is currently located at home  and wife is currently with her during visit. The provider, Jannifer Rodneyhristy Colan Laymon, FNP is located in their office at time of visit.  I discussed the limitations, risks, security and privacy concerns of performing an evaluation and management service by telephone and the availability of in person appointments. I also discussed with the patient that there may be a patient responsible charge related to this service. The patient expressed understanding and agreed to proceed.   History and Present Illness:  Pt calls the office today for chronic follow up. He states he has had recurrent sinusitis.   He reports he passed a kidney stone two weeks ago.  Hypertension  This is a chronic problem. The current episode started more than 1 year ago. The problem has been resolved (130/65) since onset. The problem is controlled. Associated symptoms include shortness of breath. Pertinent negatives include no headaches, malaise/fatigue or peripheral edema. Risk factors for coronary artery disease include dyslipidemia, diabetes mellitus, obesity, smoking/tobacco exposure and sedentary lifestyle. Past treatments include calcium channel blockers. The current treatment provides moderate improvement. Hypertensive end-organ damage includes CAD/MI. There is no history of kidney disease.  Gastroesophageal Reflux  He complains of belching and heartburn. This is a chronic problem. The current episode started more than 1 year ago. The problem occurs occasionally. The problem has been waxing and waning. Risk factors include smoking/tobacco exposure. He has tried a PPI for the symptoms. The treatment provided moderate relief.  Arthritis  Presents for follow-up visit. He complains of pain and stiffness. The  symptoms have been stable.  COPD Pt continues to smoke a pack a day. Does not wish to start using inhalers.  RLS States he is complaining not being able to keep his legs still at night or when sitting in his chair. He has taken Mirapex in the past and it worked well.    Review of Systems  Constitutional: Negative for malaise/fatigue.  Respiratory: Positive for shortness of breath.   Gastrointestinal: Positive for heartburn.  Musculoskeletal: Positive for arthritis and stiffness.  Neurological: Negative for headaches.     Observations/Objective: No SOB or distress noted   Assessment and Plan: Jacob Sanders comes in today with chief complaint of No chief complaint on file.   Diagnosis and orders addressed:  1. Arthritis  2. Current smoker Smoking cessation discussed  3. Essential hypertension, benign  4. Gastroesophageal reflux disease, esophagitis presence not specified  5. Overweight (BMI 25.0-29.9)   6. RLS (restless legs syndrome)  7. Atrial fibrillation, unspecified type (HCC) - diltiazem (CARDIZEM LA) 240 MG 24 hr tablet; Take 1 tablet (240 mg total) by mouth daily. (Needs to be seen before next refill for regular med ckup)  Dispense: 90 tablet; Refill: 0   Labs reviewed Health Maintenance reviewed Diet and exercise encouraged  Follow up plan: 3 months      I discussed the assessment and treatment plan with the patient. The patient was provided an opportunity to ask questions and all were answered. The patient agreed with the plan and demonstrated an understanding of the instructions.   The patient was advised to call back or seek an in-person evaluation if the symptoms worsen or if the condition fails to improve as anticipated.  The above assessment and management plan was  discussed with the patient. The patient verbalized understanding of and has agreed to the management plan. Patient is aware to call the clinic if symptoms persist or worsen. Patient is  aware when to return to the clinic for a follow-up visit. Patient educated on when it is appropriate to go to the emergency department.   Time call ended:  12:32 AM  I provided  21 minutes of non-face-to-face time during this encounter.    Jannifer Rodney, FNP

## 2018-07-21 ENCOUNTER — Telehealth: Payer: Self-pay | Admitting: Family

## 2018-07-21 NOTE — Telephone Encounter (Signed)
This can be addressed tomorrow when you return to work. Please advise on note.

## 2018-07-22 ENCOUNTER — Telehealth: Payer: Self-pay | Admitting: Family

## 2018-07-22 NOTE — Telephone Encounter (Signed)
Patient aware.

## 2018-07-22 NOTE — Telephone Encounter (Signed)
I have written the note, however with his COPD he should avoid crowds and try to wear a mask when possible

## 2018-07-22 NOTE — Telephone Encounter (Signed)
Aware, note is ready. 

## 2018-07-29 ENCOUNTER — Other Ambulatory Visit: Payer: Self-pay | Admitting: Family

## 2018-07-29 DIAGNOSIS — I4891 Unspecified atrial fibrillation: Secondary | ICD-10-CM

## 2018-08-12 ENCOUNTER — Other Ambulatory Visit: Payer: Self-pay | Admitting: Family

## 2018-09-30 ENCOUNTER — Other Ambulatory Visit: Payer: Self-pay | Admitting: Family

## 2018-09-30 DIAGNOSIS — I4891 Unspecified atrial fibrillation: Secondary | ICD-10-CM

## 2018-11-09 ENCOUNTER — Telehealth: Payer: Self-pay | Admitting: Family

## 2018-11-09 NOTE — Telephone Encounter (Signed)
TC to patient, appt made for 11/16/18 to discuss need & Rx for liftchair

## 2018-11-16 ENCOUNTER — Ambulatory Visit (INDEPENDENT_AMBULATORY_CARE_PROVIDER_SITE_OTHER): Payer: Medicare Other | Admitting: Family

## 2018-11-16 ENCOUNTER — Encounter: Payer: Self-pay | Admitting: Family

## 2018-11-16 DIAGNOSIS — M545 Low back pain, unspecified: Secondary | ICD-10-CM

## 2018-11-16 DIAGNOSIS — M170 Bilateral primary osteoarthritis of knee: Secondary | ICD-10-CM

## 2018-11-16 DIAGNOSIS — M160 Bilateral primary osteoarthritis of hip: Secondary | ICD-10-CM

## 2018-11-16 DIAGNOSIS — M1990 Unspecified osteoarthritis, unspecified site: Secondary | ICD-10-CM | POA: Diagnosis not present

## 2018-11-16 DIAGNOSIS — F17200 Nicotine dependence, unspecified, uncomplicated: Secondary | ICD-10-CM | POA: Diagnosis not present

## 2018-11-16 DIAGNOSIS — M549 Dorsalgia, unspecified: Secondary | ICD-10-CM | POA: Insufficient documentation

## 2018-11-16 DIAGNOSIS — G8929 Other chronic pain: Secondary | ICD-10-CM | POA: Insufficient documentation

## 2018-11-16 NOTE — Progress Notes (Signed)
Virtual Visit via telephone Note Due to COVID-19 pandemic this visit was conducted virtually. This visit type was conducted due to national recommendations for restrictions regarding the COVID-19 Pandemic (e.g. social distancing, sheltering in place) in an effort to limit this patient's exposure and mitigate transmission in our community. All issues noted in this document were discussed and addressed.  A physical exam was not performed with this format.  I connected with Jacob Sanders on 11/16/18 at 9:30 AM  by telephone and verified that I am speaking with the correct person using two identifiers. Jacob Sanders is currently located at home and no one is currently with him during visit. The provider, Evelina Dun, FNP is located in their office at time of visit.  I discussed the limitations, risks, security and privacy concerns of performing an evaluation and management service by telephone and the availability of in person appointments. I also discussed with the patient that there may be a patient responsible charge related to this service. The patient expressed understanding and agreed to proceed.   History and Present Illness:  Pt calls the office today for prescription for a lift chair. He has chronic back pain and osteoarthritis. He had hx of lumbar surgery 1998. He has a hard time standing from a chair because of his arthritis.   He also bilateral hip replacement 2019.   Back Pain This is a chronic problem. The current episode started more than 1 year ago. The problem occurs intermittently. The problem has been waxing and waning since onset. The pain is present in the lumbar spine. The quality of the pain is described as aching. The pain is at a severity of 7/10. The pain is moderate. The symptoms are aggravated by standing and sitting. Pertinent negatives include no chest pain. He has tried bed rest and home exercises for the symptoms. The treatment provided mild relief.  Arthritis  Presents for follow-up visit. He complains of pain and stiffness. The symptoms have been stable. Affected locations include the right knee, left knee, left hip, right hip, right shoulder, left shoulder, left MCP and right MCP. His pain is at a severity of 8/10.      Review of Systems  Cardiovascular: Negative for chest pain.  Musculoskeletal: Positive for arthritis, back pain and stiffness.     Observations/Objective: No SOB or distress noted   Assessment and Plan: 1. Arthritis  2. Primary osteoarthritis of both knees  3. Primary osteoarthritis of both hips  4. Current smoker  5. Chronic midline low back pain without sciatica  Will write rx for lift chair and fax to [facility] [address] for patient Continue ROM exercises  Smoking cessation discussed  Tylenol as needed Keep chronic follow up     I discussed the assessment and treatment plan with the patient. The patient was provided an opportunity to ask questions and all were answered. The patient agreed with the plan and demonstrated an understanding of the instructions.   The patient was advised to call back or seek an in-person evaluation if the symptoms worsen or if the condition fails to improve as anticipated.  The above assessment and management plan was discussed with the patient. The patient verbalized understanding of and has agreed to the management plan. Patient is aware to call the clinic if symptoms persist or worsen. Patient is aware when to return to the clinic for a follow-up visit. Patient educated on when it is appropriate to go to the emergency department.   Time call ended:  9:42 AM   I provided 12 minutes of non-face-to-face time during this encounter.    Jannifer Rodney, FNP

## 2018-12-05 ENCOUNTER — Other Ambulatory Visit: Payer: Self-pay | Admitting: Family

## 2018-12-05 DIAGNOSIS — I4891 Unspecified atrial fibrillation: Secondary | ICD-10-CM

## 2018-12-06 NOTE — Telephone Encounter (Signed)
[employer] mail order refill sent

## 2019-01-24 ENCOUNTER — Other Ambulatory Visit: Payer: Self-pay | Admitting: Family

## 2019-01-24 DIAGNOSIS — I4891 Unspecified atrial fibrillation: Secondary | ICD-10-CM

## 2019-01-25 NOTE — Telephone Encounter (Signed)
[employer] LOV 05/28/18 mail order not sent

## 2019-01-25 NOTE — Telephone Encounter (Signed)
Appt made

## 2019-01-26 ENCOUNTER — Ambulatory Visit (INDEPENDENT_AMBULATORY_CARE_PROVIDER_SITE_OTHER): Payer: Medicare Other | Admitting: Family

## 2019-01-26 ENCOUNTER — Encounter: Payer: Self-pay | Admitting: Family

## 2019-01-26 DIAGNOSIS — I4891 Unspecified atrial fibrillation: Secondary | ICD-10-CM

## 2019-01-26 DIAGNOSIS — E785 Hyperlipidemia, unspecified: Secondary | ICD-10-CM

## 2019-01-26 DIAGNOSIS — F411 Generalized anxiety disorder: Secondary | ICD-10-CM

## 2019-01-26 DIAGNOSIS — I10 Essential (primary) hypertension: Secondary | ICD-10-CM

## 2019-01-26 DIAGNOSIS — F17200 Nicotine dependence, unspecified, uncomplicated: Secondary | ICD-10-CM

## 2019-01-26 DIAGNOSIS — M1990 Unspecified osteoarthritis, unspecified site: Secondary | ICD-10-CM | POA: Diagnosis not present

## 2019-01-26 DIAGNOSIS — R42 Dizziness and giddiness: Secondary | ICD-10-CM

## 2019-01-26 DIAGNOSIS — K219 Gastro-esophageal reflux disease without esophagitis: Secondary | ICD-10-CM

## 2019-01-26 DIAGNOSIS — E663 Overweight: Secondary | ICD-10-CM

## 2019-01-26 DIAGNOSIS — E559 Vitamin D deficiency, unspecified: Secondary | ICD-10-CM

## 2019-01-26 MED ORDER — DILTIAZEM HCL ER COATED BEADS 240 MG PO TB24: 240.0000 mg | ORAL_TABLET | Freq: Every day | ORAL | 4 refills | Status: DC

## 2019-01-26 NOTE — Progress Notes (Signed)
Virtual Visit via telephone Note Due to COVID-19 pandemic this visit was conducted virtually. This visit type was conducted due to national recommendations for restrictions regarding the COVID-19 Pandemic (e.g. social distancing, sheltering in place) in an effort to limit this patient's exposure and mitigate transmission in our community. All issues noted in this document were discussed and addressed.  A physical exam was not performed with this format.  I connected with Jacob Sanders on 01/26/19 at 11:20 AM  by telephone and verified that I am speaking with the correct person using two identifiers. Jacob Sanders is currently located at home and no one is currently with her during visit. The provider, Evelina Dun, FNP is located in their office at time of visit.  I discussed the limitations, risks, security and privacy concerns of performing an evaluation and management service by telephone and the availability of in person appointments. I also discussed with the patient that there may be a patient responsible charge related to this service. The patient expressed understanding and agreed to proceed.   History and Present Illness:  Pt calls the office today for chronic follow up.  He see's the [place] once a year.  Hypertension This is a chronic problem. The current episode started more than 1 year ago. The problem has been resolved since onset. The problem is controlled. Pertinent negatives include no malaise/fatigue, peripheral edema or shortness of breath. Risk factors for coronary artery disease include obesity and male gender. The current treatment provides moderate improvement. There is no history of heart failure.  Gastroesophageal Reflux He complains of belching, heartburn and a hoarse voice. He reports no coughing. This is a chronic problem. The current episode started more than 1 year ago. The problem occurs occasionally. The problem has been waxing and waning. Risk factors include  smoking/tobacco exposure and obesity. He has tried a PPI for the symptoms. The treatment provided moderate relief.  Arthritis Presents for follow-up visit. He complains of pain. The symptoms have been stable. Affected locations include the right MCP, left MCP, right knee, left knee, left hip and right hip (back). His pain is at a severity of 5/10.  Anxiety Presents for follow-up visit. Symptoms include dizziness, excessive worry, irritability and nervous/anxious behavior. Patient reports no depressed mood or shortness of breath. Symptoms occur occasionally. The severity of symptoms is mild.    Dizziness This is a chronic problem. The current episode started more than 1 year ago. The problem occurs intermittently. The problem has been waxing and waning. Pertinent negatives include no coughing. The symptoms are aggravated by bending. Treatments tried: antivert. The treatment provided moderate relief.  Nicotine Dependence Presents for follow-up visit. Symptoms include irritability. His urge triggers include company of smokers. He smokes < 1/2 a pack of cigarettes per day.  Hyperlipidemia This is a chronic problem. The current episode started more than 1 year ago. The problem is uncontrolled. Recent lipid tests were reviewed and are high. Pertinent negatives include no shortness of breath.      Review of Systems  Constitutional: Positive for irritability. Negative for malaise/fatigue.  HENT: Positive for hoarse voice.   Respiratory: Negative for cough and shortness of breath.   Gastrointestinal: Positive for heartburn.  Musculoskeletal: Positive for arthritis.  Neurological: Positive for dizziness.  Psychiatric/Behavioral: The patient is nervous/anxious.      Observations/Objective: No SOB or distress noted  Assessment and Plan: Jacob Sanders comes in today with chief complaint of No chief complaint on file.   Diagnosis  and orders addressed:  1. Essential hypertension, benign -  CMP14+EGFR - CBC with Differential/Platelet - diltiazem (CARDIZEM LA) 240 MG 24 hr tablet; Take 1 tablet (240 mg total) by mouth daily.  Dispense: 90 tablet; Refill: 4  2. Gastroesophageal reflux disease, unspecified whether esophagitis present - CMP14+EGFR - CBC with Differential/Platelet  3. Arthritis - CMP14+EGFR - CBC with Differential/Platelet  4. Hyperlipidemia, unspecified hyperlipidemia type - CMP14+EGFR - CBC with Differential/Platelet  5. Vitamin D deficiency - CMP14+EGFR - CBC with Differential/Platelet  6. Current smoker - CMP14+EGFR - CBC with Differential/Platelet  7. Overweight (BMI 25.0-29.9) - CMP14+EGFR - CBC with Differential/Platelet  8. GAD (generalized anxiety disorder) - CMP14+EGFR - CBC with Differential/Platelet  9. Dizziness - CMP14+EGFR - CBC with Differential/Platelet  10. Atrial fibrillation, unspecified type (HCC) - CMP14+EGFR - CBC with Differential/Platelet - diltiazem (CARDIZEM LA) 240 MG 24 hr tablet; Take 1 tablet (240 mg total) by mouth daily.  Dispense: 90 tablet; Refill: 4   Labs pending Health Maintenance reviewed Diet and exercise encouraged  Follow up plan: 6 months     I discussed the assessment and treatment plan with the patient. The patient was provided an opportunity to ask questions and all were answered. The patient agreed with the plan and demonstrated an understanding of the instructions.   The patient was advised to call back or seek an in-person evaluation if the symptoms worsen or if the condition fails to improve as anticipated.  The above assessment and management plan was discussed with the patient. The patient verbalized understanding of and has agreed to the management plan. Patient is aware to call the clinic if symptoms persist or worsen. Patient is aware when to return to the clinic for a follow-up visit. Patient educated on when it is appropriate to go to the emergency department.   Time call ended:   11:43 AM   I provided 13 minutes of non-face-to-face time during this encounter.    Evelina Dun, FNP

## 2019-01-27 LAB — CBC WITH DIFFERENTIAL/PLATELET
Basophils Absolute: 0.1 x10E3/uL (ref 0.0–0.2)
Basos: 1 %
EOS (ABSOLUTE): 0.1 x10E3/uL (ref 0.0–0.4)
Eos: 1 %
Hematocrit: 42.4 % (ref 37.5–51.0)
Hemoglobin: 14.1 g/dL (ref 13.0–17.7)
Immature Grans (Abs): 0 x10E3/uL (ref 0.0–0.1)
Immature Granulocytes: 0 %
Lymphocytes Absolute: 2 x10E3/uL (ref 0.7–3.1)
Lymphs: 21 %
MCH: 27.9 pg (ref 26.6–33.0)
MCHC: 33.3 g/dL (ref 31.5–35.7)
MCV: 84 fL (ref 79–97)
Monocytes Absolute: 0.8 x10E3/uL (ref 0.1–0.9)
Monocytes: 8 %
Neutrophils Absolute: 6.7 x10E3/uL (ref 1.4–7.0)
Neutrophils: 69 %
Platelets: 318 x10E3/uL (ref 150–450)
RBC: 5.06 x10E6/uL (ref 4.14–5.80)
RDW: 15.2 % (ref 11.6–15.4)
WBC: 9.6 x10E3/uL (ref 3.4–10.8)

## 2019-01-27 LAB — CMP14+EGFR
ALT: 9 IU/L (ref 0–44)
AST: 15 IU/L (ref 0–40)
Albumin/Globulin Ratio: 1.7 (ref 1.2–2.2)
Albumin: 4 g/dL (ref 3.7–4.7)
Alkaline Phosphatase: 96 IU/L (ref 39–117)
BUN/Creatinine Ratio: 14 (ref 10–24)
BUN: 12 mg/dL (ref 8–27)
Bilirubin Total: 0.6 mg/dL (ref 0.0–1.2)
CO2: 25 mmol/L (ref 20–29)
Calcium: 9.2 mg/dL (ref 8.6–10.2)
Chloride: 103 mmol/L (ref 96–106)
Creatinine, Ser: 0.86 mg/dL (ref 0.76–1.27)
GFR calc Af Amer: 101 mL/min/1.73
GFR calc non Af Amer: 87 mL/min/1.73
Globulin, Total: 2.4 g/dL (ref 1.5–4.5)
Glucose: 123 mg/dL — ABNORMAL HIGH (ref 65–99)
Potassium: 4.6 mmol/L (ref 3.5–5.2)
Sodium: 140 mmol/L (ref 134–144)
Total Protein: 6.4 g/dL (ref 6.0–8.5)

## 2019-02-05 ENCOUNTER — Other Ambulatory Visit: Payer: Self-pay | Admitting: Family

## 2019-02-05 DIAGNOSIS — I10 Essential (primary) hypertension: Secondary | ICD-10-CM

## 2019-02-05 DIAGNOSIS — I4891 Unspecified atrial fibrillation: Secondary | ICD-10-CM

## 2019-04-06 ENCOUNTER — Other Ambulatory Visit: Payer: Self-pay | Admitting: Family Medicine

## 2019-04-06 NOTE — Telephone Encounter (Signed)
Last office visit 02/13/2019 Last refill 08/13/2018, #180, 3 refills

## 2019-05-27 ENCOUNTER — Other Ambulatory Visit: Payer: Self-pay | Admitting: Family Medicine

## 2019-05-31 ENCOUNTER — Telehealth: Payer: Self-pay

## 2019-05-31 ENCOUNTER — Ambulatory Visit (INDEPENDENT_AMBULATORY_CARE_PROVIDER_SITE_OTHER): Payer: Medicare Other

## 2019-05-31 DIAGNOSIS — Z0000 Encounter for general adult medical examination without abnormal findings: Secondary | ICD-10-CM | POA: Diagnosis not present

## 2019-05-31 NOTE — Telephone Encounter (Addendum)
Patient c/o sinus infection. He does sound congested. When he lays down at night the drainage goes down his throat. Patient denies cough, fever. He has not been around a positive covid case. Patient does have yellow/ white mucous when he awakes in the morning.  He request that Zpak be called in. This has worked in the past.   Micah Flesher ahead and scheduled after hours appt on 06/01/2019 at 5pm. Send link to wife's IPAD @ [phone]

## 2019-05-31 NOTE — Progress Notes (Addendum)
MEDICARE ANNUAL WELLNESS VISIT  05/31/2019  Telephone Visit Disclaimer This Medicare AWV was conducted by telephone due to national recommendations for restrictions regarding the COVID-19 Pandemic (e.g. social distancing).  I verified, using two identifiers, that I am speaking with Jacob Sanders or their authorized healthcare agent. I discussed the limitations, risks, security, and privacy concerns of performing an evaluation and management service by telephone and the potential availability of an in-person appointment in the future. The patient expressed understanding and agreed to proceed.   Subjective:  Jacob Sanders is a 74 y.o. male patient of Hawks, Edilia Bo, FNP who had a Medicare Annual Wellness Visit today via telephone. Jacob Sanders is Retired and lives with their spouse. he has three children. he reports that he is socially active and does interact with friends/family regularly. he is minimally physically active.  Patient Care Team: Junie Spencer, FNP as PCP - General (Nurse Practitioner)  Advanced Directives 05/31/2019 05/21/2018 07/18/2016 07/17/2016 07/17/2016 05/22/2016 03/21/2016  Does Patient Have a Medical Advance Directive? Yes No No No No No Yes  Type of Advance Directive Living will - - - - - Midwife;Living will  Does patient want to make changes to medical advance directive? No - Patient declined - - - - - No - Patient declined  Copy of Healthcare Power of Attorney in Chart? - - - - - - No - copy requested  Would patient like information on creating a medical advance directive? - No - Patient declined No - Patient declined No - Patient declined No - Patient declined No - Patient declined -    Hospital Utilization Over the Past 12 Months: # of hospitalizations or ER visits: 0 # of surgeries: 0  Review of Systems    Patient reports that his overall health is worse compared to last year.  Negative except Patient complains of possible sinus infection  today. He has had symptoms for at least one week. Patient c/o nasal congestion and drainage during the night. After resting he can cough up thick yellow/white mucous. Pt denies fever or being around a known positive Covid Case. A telephone message has been sent to [facility] for review.  Patient Reported Readings (BP, Pulse, CBG, Weight, etc) none  Pain Assessment Pain : No/denies pain     Current Medications & Allergies (verified) Allergies as of 05/31/2019       Reactions   Statins    Prednisone Swelling        Medication List        Accurate as of May 31, 2019  2:26 PM. If you have any questions, ask your nurse or doctor.          STOP taking these medications    albuterol 108 (90 Base) MCG/ACT inhaler Commonly known as: VENTOLIN HFA   fluticasone 50 MCG/ACT nasal spray Commonly known as: FLONASE   pramipexole 0.25 MG tablet Commonly known as: Mirapex       TAKE these medications    cetirizine 10 MG tablet Commonly known as: ZYRTEC Take 1 tablet (10 mg total) by mouth daily.   diltiazem 240 MG 24 hr tablet Commonly known as: CARDIZEM LA TAKE 1 TABLET BY MOUTH  DAILY What changed:  how much to take how to take this when to take this   meclizine 25 MG tablet Commonly known as: ANTIVERT TAKE 1 TABLET BY MOUTH 3  TIMES DAILY AS NEEDED FOR  DIZZINESS   omeprazole 20 MG capsule  Commonly known as: PRILOSEC TAKE 1 CAPSULE BY MOUTH  DAILY AS NEEDED        History (reviewed): Past Medical History:  Diagnosis Date   Arthritis    GERD (gastroesophageal reflux disease)    Hyperlipidemia    Hypertension    Past Surgical History:  Procedure Laterality Date   HAND SURGERY Left 1995   SPINE SURGERY     Resulting staph infection   TONSILLECTOMY     childhood    TOOTH EXTRACTION  3 weeks ago ;   had abcess beneath tooth; experiened some bleeding for 20 hours post op   TOTAL HIP ARTHROPLASTY Left 05/22/2016   Procedure: LEFT TOTAL HIP ARTHROPLASTY  ANTERIOR APPROACH;  Surgeon: Samson Frederic, MD;  Location: [facility];  Service: Orthopedics;  Laterality: Left;  Dr. requesting RNFA   TOTAL HIP ARTHROPLASTY Right 07/17/2016   Procedure: RIGHT TOTAL HIP ARTHROPLASTY ANTERIOR APPROACH;  Surgeon: Samson Frederic, MD;  Location: [facility];  Service: Orthopedics;  Laterality: Right;  needs RNFA   Family History  Problem Relation Age of Onset   Hypertension Mother    Aneurysm Father    Hypertension Father    Social History   Socioeconomic History   Marital status: Married    Spouse name: Steffanie Dunn   Number of children: 3   Years of education: 10   Highest education level: 10th grade  Occupational History   Occupation: Retired  Tobacco Use   Smoking status: Current Every Day Smoker    Packs/day: 1.00    Years: 30.00    Pack years: 30.00    Types: Cigars   Smokeless tobacco: Never Used  Substance and Sexual Activity   Alcohol use: No   Drug use: No   Sexual activity: Not Currently  Other Topics Concern   Not on file  Social History Narrative   Not on file   Social Determinants of Health   Financial Resource Strain:    Difficulty of Paying Living Expenses:   Food Insecurity:    Worried About Programme researcher, broadcasting/film/video in the Last Year:    Barista in the Last Year:   Transportation Needs:    Freight forwarder (Medical):    Lack of Transportation (Non-Medical):   Physical Activity:    Days of Exercise per Week:    Minutes of Exercise per Session:   Stress:    Feeling of Stress :   Social Connections:    Frequency of Communication with Friends and Family:    Frequency of Social Gatherings with Friends and Family:    Attends Religious Services:    Active Member of Clubs or Organizations:    Attends Banker Meetings:    Marital Status:     Activities of Daily Living In your present state of health, do you have any difficulty performing the following activities: 05/31/2019  Hearing? N  Vision? N  Difficulty  concentrating or making decisions? N  Walking or climbing stairs? N  Dressing or bathing? N  Doing errands, shopping? N  Preparing Food and eating ? N  Using the Toilet? N  In the past six months, have you accidently leaked urine? N  Do you have problems with loss of bowel control? N  Managing your Medications? N  Managing your Finances? N  Housekeeping or managing your Housekeeping? N  Some recent data might be hidden    Patient Education/ Literacy How often do you need to have someone help you when  you read instructions, pamphlets, or other written materials from your doctor or pharmacy?: 2 - Rarely What is the last grade level you completed in school?: 10th grade  Exercise Current Exercise Habits: The patient does not participate in regular exercise at present, Exercise limited by: orthopedic condition(s);cardiac condition(s)  Diet Patient reports consuming 2 meals a day and 2 snack(s) a day Patient reports that his primary diet is: Regular Patient reports that she does have regular access to food.   Depression Screen PHQ 2/9 Scores 05/31/2019 05/21/2018 05/06/2018 03/30/2018 11/19/2017 10/06/2017 09/10/2017  PHQ - 2 Score 0 0 0 0 0 0 0     Fall Risk Fall Risk  05/31/2019 05/21/2018 03/30/2018 11/19/2017 10/06/2017  Falls in the past year? 0 0 0 0 No  Number falls in past yr: - - - - -  Injury with Fall? - - - - -     Objective:  Jacob Sanders seemed alert and oriented and he participated appropriately during our telephone visit.  Blood Pressure Weight BMI  BP Readings from Last 3 Encounters:  03/30/18 (!) 171/110  11/19/17 (!) 134/91  10/06/17 (!) 149/93   Wt Readings from Last 3 Encounters:  03/30/18 199 lb (90.3 kg)  11/19/17 193 lb 12.8 oz (87.9 kg)  10/06/17 194 lb 9.6 oz (88.3 kg)   BMI Readings from Last 1 Encounters:  03/30/18 28.55 kg/m    *Unable to obtain current vital signs, weight, and BMI due to telephone visit type  Hearing/Vision  Jacob Sanders did not seem  to have difficulty with hearing/understanding during the telephone conversation Reports that he has not had a formal eye exam by an eye care professional within the past year Reports that he has not had a formal hearing evaluation within the past year *Unable to fully assess hearing and vision during telephone visit type  Cognitive Function: 6CIT Screen 05/31/2019 05/21/2018  What Year? 0 points 0 points  What month? 0 points 0 points  What time? 0 points 0 points  Count back from 20 0 points 0 points  Months in reverse 0 points 0 points  Repeat phrase 2 points 0 points  Total Score 2 0   (Normal:0-7, Significant for Dysfunction: >8)  Normal Cognitive Function Screening: Yes   Immunization & Health Maintenance Record  There is no immunization history on file for this patient.  Health Maintenance  Topic Date Due   COVID-19 Vaccine (1) Never done   COLONOSCOPY  Never done   COLON CANCER SCREENING ANNUAL FOBT  03/27/2017   PNA vac Low Risk Adult (1 of 2 - PCV13) 08/21/2028 (Originally 07/29/2012)   INFLUENZA VACCINE  08/14/2019   TETANUS/TDAP  01/13/2021   Hepatitis C Screening  Completed       Assessment  This is a routine wellness examination for Jacob Sanders.  Health Maintenance: Due or Overdue Health Maintenance Due  Topic Date Due   COVID-19 Vaccine (1) Never done   COLONOSCOPY  Never done   COLON CANCER SCREENING ANNUAL FOBT  03/27/2017    Jacob Sanders does not need a referral for Community Assistance: Care Management:   no Social Work:    no Prescription Assistance:  no Nutrition/Diabetes Education:  no   Plan:  Personalized Goals Goals Addressed             This Visit's Progress    Exercise 150 min/wk Moderate Activity       Prevent falls  Personalized Health Maintenance & Screening Recommendations    Lung Cancer Screening Recommended: no (Low Dose CT Chest recommended if Age 55-80 years, 30 pack-year currently smoking OR have quit  w/in past 15 years) Hepatitis C Screening recommended: no HIV Screening recommended: no  Advanced Directives: Written information was not prepared per patient's request.  Referrals & Orders No orders of the defined types were placed in this encounter.   Follow-up Plan Follow-up with Junie Spencer, FNP as planned Schedule 07/29/2019    I have personally reviewed and noted the following in the patient's chart:   Medical and social history Use of alcohol, tobacco or illicit drugs  Current medications and supplements Functional ability and status Nutritional status Physical activity Advanced directives List of other physicians Hospitalizations, surgeries, and ER visits in previous 12 months Vitals Screenings to include cognitive, depression, and falls Referrals and appointments  In addition, I have reviewed and discussed with Jacob Sanders certain preventive protocols, quality metrics, and best practice recommendations. A written personalized care plan for preventive services as well as general preventive health recommendations is available and can be mailed to the patient at his request.      Suzan Slick [facility]  7/93/9030   I have reviewed and agree with the above AWV documentation.   Jannifer Rodney, FNP

## 2019-05-31 NOTE — Patient Instructions (Signed)
  MEDICARE ANNUAL WELLNESS VISIT Health Maintenance Summary and Written Plan of Care  Mr. Geffre ,  Thank you for allowing me to perform your Medicare Annual Wellness Visit and for your ongoing commitment to your health.   Health Maintenance & Immunization History Health Maintenance  Topic Date Due  . COVID-19 Vaccine (1) Never done  . COLONOSCOPY  Never done  . COLON CANCER SCREENING ANNUAL FOBT  03/27/2017  . PNA vac Low Risk Adult (1 of 2 - PCV13) 08/21/2028 (Originally 07/29/2012)  . INFLUENZA VACCINE  08/14/2019  . TETANUS/TDAP  01/13/2021  . Hepatitis C Screening  Completed    There is no immunization history on file for this patient.  These are the patient goals that we discussed: Goals Addressed            This Visit's Progress   . Exercise 150 min/wk Moderate Activity      . Prevent falls          This is a list of Health Maintenance Items that are overdue or due now: Health Maintenance Due  Topic Date Due  . COVID-19 Vaccine (1) Never done  . COLONOSCOPY  Never done  . COLON CANCER SCREENING ANNUAL FOBT  03/27/2017     Orders/Referrals Placed Today: No orders of the defined types were placed in this encounter.  (Contact our referral department at [phone] if you have not spoken with someone about your referral appointment within the next 5 days)    Follow-up Plan  Scheduled with Jannifer Rodney,  FNP on Friday, July 29, 2019 at 8:25am.

## 2019-06-01 ENCOUNTER — Telehealth (INDEPENDENT_AMBULATORY_CARE_PROVIDER_SITE_OTHER): Payer: Medicare Other | Admitting: Family Medicine

## 2019-06-01 ENCOUNTER — Encounter: Payer: Self-pay | Admitting: Family Medicine

## 2019-06-01 DIAGNOSIS — J019 Acute sinusitis, unspecified: Secondary | ICD-10-CM | POA: Diagnosis not present

## 2019-06-01 MED ORDER — AZITHROMYCIN 250 MG PO TABS: ORAL_TABLET | ORAL | 0 refills | Status: DC

## 2019-06-01 NOTE — Progress Notes (Signed)
   Virtual Visit via Telephone Note  I connected with Jacob Sanders on 06/01/19 at 5:09 PM by telephone and verified that I am speaking with the correct person using two identifiers. Jacob Sanders is currently located at home and his wife is currently with him during this visit. The provider, Gwenlyn Fudge, FNP is located in their office at time of visit.  I discussed the limitations, risks, security and privacy concerns of performing an evaluation and management service by telephone and the availability of in person appointments. I also discussed with the patient that there may be a patient responsible charge related to this service. The patient expressed understanding and agreed to proceed.  Subjective: PCP: Junie Spencer, FNP  Chief Complaint  Patient presents with  . URI   Patient complains of cough, head congestion, facial pain/pressure and postnasal drainage. Onset of symptoms was 4 weeks ago, unchanged since that time. He is drinking plenty of fluids. Evaluation to date: none. Treatment to date: Tylenol & Mucinex. He does smoke.    ROS: Per HPI  Current Outpatient Medications:  .  cetirizine (ZYRTEC) 10 MG tablet, Take 1 tablet (10 mg total) by mouth daily., Disp: 30 tablet, Rfl: 11 .  diltiazem (CARDIZEM LA) 240 MG 24 hr tablet, TAKE 1 TABLET BY MOUTH  DAILY (Patient taking differently: at bedtime. ), Disp: 90 tablet, Rfl: 3 .  meclizine (ANTIVERT) 25 MG tablet, TAKE 1 TABLET BY MOUTH 3  TIMES DAILY AS NEEDED FOR  DIZZINESS, Disp: 180 tablet, Rfl: 0 .  omeprazole (PRILOSEC) 20 MG capsule, TAKE 1 CAPSULE BY MOUTH  DAILY AS NEEDED, Disp: 90 capsule, Rfl: 3  Allergies  Allergen Reactions  . Statins   . Prednisone Swelling   Past Medical History:  Diagnosis Date  . Arthritis   . GERD (gastroesophageal reflux disease)   . Hyperlipidemia   . Hypertension     Observations/Objective: A&O  No respiratory distress or wheezing audible over the phone Mood, judgement, and  thought processes all WNL  Assessment and Plan: 1. Acute non-recurrent sinusitis, unspecified location - Encouraged COVID-19 testing which patient does not plan on doing. Symptom management.  - azithromycin (ZITHROMAX Z-PAK) 250 MG tablet; Take 2 tablets (500 mg) PO today, then 1 tablet (250 mg) PO daily x4 days.  Dispense: 6 tablet; Refill: 0   Follow Up Instructions:  I discussed the assessment and treatment plan with the patient. The patient was provided an opportunity to ask questions and all were answered. The patient agreed with the plan and demonstrated an understanding of the instructions.   The patient was advised to call back or seek an in-person evaluation if the symptoms worsen or if the condition fails to improve as anticipated.  The above assessment and management plan was discussed with the patient. The patient verbalized understanding of and has agreed to the management plan. Patient is aware to call the clinic if symptoms persist or worsen. Patient is aware when to return to the clinic for a follow-up visit. Patient educated on when it is appropriate to go to the emergency department.   Time call ended: 5:13 PM  I provided 6 minutes of non-face-to-face time during this encounter.  Deliah Boston, MSN, APRN, FNP-C [facility] 06/01/19

## 2019-07-27 DIAGNOSIS — S52502A Unspecified fracture of the lower end of left radius, initial encounter for closed fracture: Secondary | ICD-10-CM | POA: Insufficient documentation

## 2019-07-27 DIAGNOSIS — M25532 Pain in left wrist: Secondary | ICD-10-CM | POA: Diagnosis not present

## 2019-07-28 ENCOUNTER — Other Ambulatory Visit (HOSPITAL_COMMUNITY)
Admission: RE | Admit: 2019-07-28 | Discharge: 2019-07-28 | Disposition: A | Discharge status: Home / Self Care | Payer: Medicare Other | Source: Ambulatory Visit | Attending: Orthopedic Surgery | Admitting: Orthopedic Surgery

## 2019-07-28 ENCOUNTER — Encounter (HOSPITAL_COMMUNITY): Payer: Self-pay | Admitting: Orthopedic Surgery

## 2019-07-28 ENCOUNTER — Other Ambulatory Visit: Payer: Self-pay

## 2019-07-28 DIAGNOSIS — Z01812 Encounter for preprocedural laboratory examination: Secondary | ICD-10-CM | POA: Diagnosis not present

## 2019-07-28 DIAGNOSIS — Z20822 Contact with and (suspected) exposure to covid-19: Secondary | ICD-10-CM | POA: Insufficient documentation

## 2019-07-28 LAB — SARS CORONAVIRUS 2 (TAT 6-24 HRS): SARS Coronavirus 2: NEGATIVE

## 2019-07-28 NOTE — Progress Notes (Signed)
Anesthesia Chart Review: Same day workup  History of atrial fibrillation.  Previously on Coumadin, followed by PCP.  Stopped in 2019 due to recurrent hematuria.  He is rate controlled on Cardizem.  Echo 2018 showed EF 55 to 60%, normal wall motion, mild TR.  Nuclear stress 2018 was nonischemic.  Will need day of surgery labs, eval, and EKG.  TTE 01/24/2016: - Left ventricle: The cavity size was normal. Systolic function was  normal. The estimated ejection fraction was in the range of 55%  to 60%. Wall motion was normal; there were no regional wall  motion abnormalities.  - Left atrium: The atrium was mildly dilated.   Nuclear stress 01/17/2016:  The left ventricular ejection fraction is moderately decreased (30-44%).  Nuclear stress EF: 38%.  No T wave inversion was noted during stress.  There was no ST segment deviation noted during stress.  Defect 1: There is a small defect of mild severity.  This is an intermediate risk study.  There is prominent right ventricular uptake.   Small sized, mild intensity fixed apical perfusion defect, likely artifact. No significant reversible ischemia. LVEF 38%, however, there appears to be gating abnormality, therefore, would suggest a limited echo for LVEF. Prominent RV uptake is also noted, suggesting elevated pulmonary pressures. This is an intermediate risk study based on LVEF.   Zannie Cove [facility] Short Stay Center/Anesthesiology Phone [phone] 07/28/2019 1:42 PM

## 2019-07-28 NOTE — Anesthesia Preprocedure Evaluation (Addendum)
Anesthesia Evaluation  Patient identified by MRN, date of birth, ID band Patient awake    Reviewed: Allergy & Precautions, NPO status , Patient's Chart, lab work & pertinent test results  History of Anesthesia Complications Negative for: history of anesthetic complications  Airway Mallampati: II  TM Distance: >3 FB Neck ROM: Full    Dental  (+) Dental Advisory Given, Partial Upper, Partial Lower   Pulmonary Current Smoker and Patient abstained from smoking.,    Pulmonary exam normal breath sounds clear to auscultation       Cardiovascular hypertension, Pt. on medications + dysrhythmias Atrial Fibrillation  Rhythm:Irregular Rate:Abnormal     Neuro/Psych PSYCHIATRIC DISORDERS Anxiety negative neurological ROS     GI/Hepatic Neg liver ROS, GERD  Medicated and Controlled,  Endo/Other  negative endocrine ROS  Renal/GU negative Renal ROS     Musculoskeletal negative musculoskeletal ROS (+) Arthritis ,  Left comminuted complex distal radius fracture in the face of prior deformity about the fingers   Abdominal   Peds  Hematology  (+) Blood dyscrasia, anemia ,   Anesthesia Other Findings Day of surgery medications reviewed with the patient.  Reproductive/Obstetrics                          Anesthesia Physical Anesthesia Plan  ASA: III  Anesthesia Plan: Regional and MAC   Post-op Pain Management:    Induction: Intravenous  PONV Risk Score and Plan: 0 and Propofol infusion and Treatment may vary due to age or medical condition  Airway Management Planned: Natural Airway and Nasal Cannula  Additional Equipment:   Intra-op Plan:   Post-operative Plan:   Informed Consent: I have reviewed the patients History and Physical, chart, labs and discussed the procedure including the risks, benefits and alternatives for the proposed anesthesia with the patient or authorized representative who has  indicated his/her understanding and acceptance.     Dental advisory given  Plan Discussed with: CRNA  Anesthesia Plan Comments: (PAT note by Karoline Caldwell, PA-C:  History of atrial fibrillation.  Previously on Coumadin, followed by PCP.  Stopped in 2019 due to recurrent hematuria.  He is rate controlled on Cardizem.  Echo 2018 showed EF 55 to 60%, normal wall motion, mild TR.  Nuclear stress 2018 was nonischemic.  Will need day of surgery labs, eval, and EKG.  TTE 01/24/2016: - Left ventricle: The cavity size was normal. Systolic function was  normal. The estimated ejection fraction was in the range of 55%  to 60%. Wall motion was normal; there were no regional wall  motion abnormalities.  - Left atrium: The atrium was mildly dilated.   Nuclear stress 01/17/2016: The left ventricular ejection fraction is moderately decreased (30-44%). Nuclear stress EF: 38%. No T wave inversion was noted during stress. There was no ST segment deviation noted during stress. Defect 1: There is a small defect of mild severity. This is an intermediate risk study. There is prominent right ventricular uptake.   Small sized, mild intensity fixed apical perfusion defect, likely artifact. No significant reversible ischemia. LVEF 38%, however, there appears to be gating abnormality, therefore, would suggest a limited echo for LVEF. Prominent RV uptake is also noted, suggesting elevated pulmonary pressures. This is an intermediate risk study based on LVEF. )      Anesthesia Quick Evaluation

## 2019-07-28 NOTE — Progress Notes (Addendum)
Mr. Medford denies chest pain or shortness of breath. Patient denies having any s/s of Covid nor has he been in contact with anyone who does. Mr Yera will be tested for Covid this am.  Mr Kerins has a history of Afib, he has not seen cardiologist since 2018. Patient stopped taking Coumadin in 2018 due to blood in urine and lots of bleeding after dental surgery. Mr. Kepner 's PCP at [facility]) gave patient samples of Noel Christmas, patient can not afford it, "and no one will help me."  I asked patient if he checked with [facility],  (patient informed me that he is a [place] Veteran) Mr. Cousins said that they will not help him, then quickly added that he doesn't like to take medications. I asked patient Mr. Broski about his blood pressure, he has had high one in PCP office, Mrs. Maese said that patient has "white coat syndrome", she reports that she checks patient's blood pressure at home and it has been normal. I aske d for some blood pressure readings, the only one she gave me was 120/60 I asked anesthesia PA-C to review chart.

## 2019-07-29 ENCOUNTER — Ambulatory Visit (HOSPITAL_COMMUNITY)
Admission: RE | Admit: 2019-07-29 | Discharge: 2019-07-29 | Disposition: A | Discharge status: Home / Self Care | Payer: Medicare Other | Attending: Orthopedic Surgery | Admitting: Orthopedic Surgery

## 2019-07-29 ENCOUNTER — Ambulatory Visit: Payer: Medicare Other | Admitting: Family

## 2019-07-29 ENCOUNTER — Ambulatory Visit (HOSPITAL_COMMUNITY): Payer: Medicare Other

## 2019-07-29 ENCOUNTER — Encounter (HOSPITAL_COMMUNITY)
Admission: RE | Disposition: A | Discharge status: Home / Self Care | Payer: Self-pay | Source: Home / Self Care | Attending: Orthopedic Surgery

## 2019-07-29 ENCOUNTER — Encounter (HOSPITAL_COMMUNITY): Payer: Self-pay | Admitting: Orthopedic Surgery

## 2019-07-29 ENCOUNTER — Ambulatory Visit (HOSPITAL_COMMUNITY): Payer: Medicare Other | Admitting: Physician Assistant

## 2019-07-29 ENCOUNTER — Other Ambulatory Visit: Payer: Self-pay

## 2019-07-29 DIAGNOSIS — X58XXXA Exposure to other specified factors, initial encounter: Secondary | ICD-10-CM | POA: Insufficient documentation

## 2019-07-29 DIAGNOSIS — I4891 Unspecified atrial fibrillation: Secondary | ICD-10-CM | POA: Insufficient documentation

## 2019-07-29 DIAGNOSIS — K219 Gastro-esophageal reflux disease without esophagitis: Secondary | ICD-10-CM | POA: Diagnosis not present

## 2019-07-29 DIAGNOSIS — J302 Other seasonal allergic rhinitis: Secondary | ICD-10-CM | POA: Diagnosis not present

## 2019-07-29 DIAGNOSIS — F17290 Nicotine dependence, other tobacco product, uncomplicated: Secondary | ICD-10-CM | POA: Insufficient documentation

## 2019-07-29 DIAGNOSIS — Z79899 Other long term (current) drug therapy: Secondary | ICD-10-CM | POA: Diagnosis not present

## 2019-07-29 DIAGNOSIS — Z9889 Other specified postprocedural states: Secondary | ICD-10-CM | POA: Diagnosis not present

## 2019-07-29 DIAGNOSIS — S52572A Other intraarticular fracture of lower end of left radius, initial encounter for closed fracture: Secondary | ICD-10-CM | POA: Insufficient documentation

## 2019-07-29 DIAGNOSIS — E559 Vitamin D deficiency, unspecified: Secondary | ICD-10-CM | POA: Diagnosis not present

## 2019-07-29 DIAGNOSIS — I10 Essential (primary) hypertension: Secondary | ICD-10-CM | POA: Diagnosis not present

## 2019-07-29 DIAGNOSIS — Z96643 Presence of artificial hip joint, bilateral: Secondary | ICD-10-CM | POA: Insufficient documentation

## 2019-07-29 DIAGNOSIS — E785 Hyperlipidemia, unspecified: Secondary | ICD-10-CM | POA: Diagnosis not present

## 2019-07-29 HISTORY — DX: Unspecified staphylococcus as the cause of diseases classified elsewhere: B95.8

## 2019-07-29 HISTORY — PX: OPEN REDUCTION INTERNAL FIXATION (ORIF) DISTAL RADIAL FRACTURE: SHX5989

## 2019-07-29 HISTORY — DX: Personal history of urinary calculi: Z87.442

## 2019-07-29 HISTORY — DX: Other injury of unspecified body region, initial encounter: T14.8XXA

## 2019-07-29 HISTORY — DX: Cardiac arrhythmia, unspecified: I49.9

## 2019-07-29 HISTORY — DX: Other seasonal allergic rhinitis: J30.2

## 2019-07-29 LAB — CBC
HCT: 42.7 % (ref 39.0–52.0)
Hemoglobin: 12.9 g/dL — ABNORMAL LOW (ref 13.0–17.0)
MCH: 26.8 pg (ref 26.0–34.0)
MCHC: 30.2 g/dL (ref 30.0–36.0)
MCV: 88.8 fL (ref 80.0–100.0)
Platelets: 240 10*3/uL (ref 150–400)
RBC: 4.81 MIL/uL (ref 4.22–5.81)
RDW: 18.4 % — ABNORMAL HIGH (ref 11.5–15.5)
WBC: 10.2 10*3/uL (ref 4.0–10.5)
nRBC: 0 % (ref 0.0–0.2)

## 2019-07-29 LAB — BASIC METABOLIC PANEL
Anion gap: 10 (ref 5–15)
BUN: 10 mg/dL (ref 8–23)
CO2: 21 mmol/L — ABNORMAL LOW (ref 22–32)
Calcium: 8.5 mg/dL — ABNORMAL LOW (ref 8.9–10.3)
Chloride: 107 mmol/L (ref 98–111)
Creatinine, Ser: 0.85 mg/dL (ref 0.61–1.24)
GFR calc Af Amer: >60 mL/min (ref >60)
GFR calc non Af Amer: >60 mL/min (ref >60)
Glucose, Bld: 93 mg/dL (ref 70–99)
Potassium: 3.6 mmol/L (ref 3.5–5.1)
Sodium: 138 mmol/L (ref 135–145)

## 2019-07-29 SURGERY — OPEN REDUCTION INTERNAL FIXATION (ORIF) DISTAL RADIUS FRACTURE
Anesthesia: Monitor Anesthesia Care | Site: Arm Lower | Laterality: Left

## 2019-07-29 MED ORDER — BUPIVACAINE HCL (PF) 0.25 % IJ SOLN: INTRAMUSCULAR | Status: DC | PRN

## 2019-07-29 MED ORDER — FENTANYL CITRATE (PF) 100 MCG/2ML IJ SOLN: 50.0000 ug | Freq: Once | INTRAMUSCULAR | Status: AC

## 2019-07-29 MED ORDER — HYDRALAZINE HCL 20 MG/ML IJ SOLN: 10.0000 mg | Freq: Once | INTRAMUSCULAR | Status: AC

## 2019-07-29 MED ORDER — FENTANYL CITRATE (PF) 100 MCG/2ML IJ SOLN: 25.0000 ug | INTRAMUSCULAR | Status: DC | PRN

## 2019-07-29 MED ORDER — ORAL CARE MOUTH RINSE: 15.0000 mL | Freq: Once | OROMUCOSAL | Status: AC

## 2019-07-29 MED ORDER — LACTATED RINGERS IV SOLN: INTRAVENOUS | Status: DC

## 2019-07-29 MED ORDER — ONDANSETRON HCL 4 MG/2ML IJ SOLN: 4.0000 mg | Freq: Once | INTRAMUSCULAR | Status: DC | PRN

## 2019-07-29 MED ORDER — KETOROLAC TROMETHAMINE 15 MG/ML IJ SOLN
INTRAMUSCULAR | Status: AC
  Administered 2019-07-29: 30 mg
  Filled 2019-07-29: qty 2

## 2019-07-29 MED ORDER — PROPOFOL 500 MG/50ML IV EMUL
INTRAVENOUS | Status: DC | PRN
  Administered 2019-07-29: 150 ug/kg/min via INTRAVENOUS

## 2019-07-29 MED ORDER — FENTANYL CITRATE (PF) 100 MCG/2ML IJ SOLN
INTRAMUSCULAR | Status: AC
  Administered 2019-07-29: 50 ug via INTRAVENOUS
  Filled 2019-07-29: qty 2

## 2019-07-29 MED ORDER — 0.9 % SODIUM CHLORIDE (POUR BTL) OPTIME
TOPICAL | Status: DC | PRN
  Administered 2019-07-29: 1000 mL

## 2019-07-29 MED ORDER — ACETAMINOPHEN 500 MG PO TABS
1000.0000 mg | ORAL_TABLET | Freq: Once | ORAL | Status: AC
  Administered 2019-07-29: 1000 mg via ORAL
  Filled 2019-07-29: qty 2

## 2019-07-29 MED ORDER — ROPIVACAINE HCL 5 MG/ML IJ SOLN
INTRAMUSCULAR | Status: DC | PRN
  Administered 2019-07-29: 30 mL via PERINEURAL

## 2019-07-29 MED ORDER — BUPIVACAINE HCL (PF) 0.25 % IJ SOLN
INTRAMUSCULAR | Status: AC
  Filled 2019-07-29: qty 30

## 2019-07-29 MED ORDER — KETOROLAC TROMETHAMINE 30 MG/ML IJ SOLN: 30.0000 mg | Freq: Once | INTRAMUSCULAR | Status: DC

## 2019-07-29 MED ORDER — HYDRALAZINE HCL 20 MG/ML IJ SOLN
INTRAMUSCULAR | Status: AC
  Administered 2019-07-29: 10 mg via INTRAVENOUS
  Filled 2019-07-29: qty 1

## 2019-07-29 MED ORDER — CHLORHEXIDINE GLUCONATE 0.12 % MT SOLN
15.0000 mL | Freq: Once | OROMUCOSAL | Status: AC
  Administered 2019-07-29: 15 mL via OROMUCOSAL
  Filled 2019-07-29: qty 15

## 2019-07-29 MED ORDER — MIDAZOLAM HCL 2 MG/2ML IJ SOLN
INTRAMUSCULAR | Status: AC
  Administered 2019-07-29: 2 mg via INTRAVENOUS
  Filled 2019-07-29: qty 2

## 2019-07-29 MED ORDER — CEFAZOLIN SODIUM-DEXTROSE 2-4 GM/100ML-% IV SOLN
2.0000 g | INTRAVENOUS | Status: AC
  Administered 2019-07-29: 2 g via INTRAVENOUS
  Filled 2019-07-29: qty 100

## 2019-07-29 MED ORDER — MIDAZOLAM HCL 2 MG/2ML IJ SOLN: 2.0000 mg | Freq: Once | INTRAMUSCULAR | Status: AC

## 2019-07-29 SURGICAL SUPPLY — 60 items
BIT DRILL 2.2 SS TIBIAL (BIT) ×2 IMPLANT
BLADE CLIPPER SURG (BLADE) IMPLANT
BNDG CMPR 9X4 STRL LF SNTH (GAUZE/BANDAGES/DRESSINGS) ×1
BNDG ELASTIC 3X5.8 VLCR STR LF (GAUZE/BANDAGES/DRESSINGS) ×7 IMPLANT
BNDG ELASTIC 4X5.8 VLCR STR LF (GAUZE/BANDAGES/DRESSINGS) ×1 IMPLANT
BNDG ESMARK 4X9 LF (GAUZE/BANDAGES/DRESSINGS) ×3 IMPLANT
BNDG GAUZE ELAST 4 BULKY (GAUZE/BANDAGES/DRESSINGS) ×5 IMPLANT
CORD BIPOLAR FORCEPS 12FT (ELECTRODE) ×3 IMPLANT
COVER SURGICAL LIGHT HANDLE (MISCELLANEOUS) ×3 IMPLANT
CUFF TOURN SGL QUICK 18X4 (TOURNIQUET CUFF) ×3 IMPLANT
CUFF TOURN SGL QUICK 24 (TOURNIQUET CUFF)
CUFF TRNQT CYL 24X4X16.5-23 (TOURNIQUET CUFF) IMPLANT
DRAPE OEC MINIVIEW 54X84 (DRAPES) ×2 IMPLANT
DRAPE U-SHAPE 47X51 STRL (DRAPES) ×3 IMPLANT
DRSG ADAPTIC 3X8 NADH LF (GAUZE/BANDAGES/DRESSINGS) ×3 IMPLANT
GAUZE SPONGE 4X4 12PLY STRL (GAUZE/BANDAGES/DRESSINGS) ×3 IMPLANT
GAUZE XEROFORM 5X9 LF (GAUZE/BANDAGES/DRESSINGS) ×3 IMPLANT
GLOVE SS BIOGEL STRL SZ 8 (GLOVE) ×1 IMPLANT
GLOVE SUPERSENSE BIOGEL SZ 8 (GLOVE) ×2
GOWN STRL REUS W/ TWL LRG LVL3 (GOWN DISPOSABLE) ×3 IMPLANT
GOWN STRL REUS W/ TWL XL LVL3 (GOWN DISPOSABLE) ×3 IMPLANT
GOWN STRL REUS W/TWL LRG LVL3 (GOWN DISPOSABLE) ×6
GOWN STRL REUS W/TWL XL LVL3 (GOWN DISPOSABLE) ×3
KIT BASIN OR (CUSTOM PROCEDURE TRAY) ×3 IMPLANT
KIT TURNOVER KIT B (KITS) ×3 IMPLANT
MANIFOLD NEPTUNE II (INSTRUMENTS) ×3 IMPLANT
NDL HYPO 25GX1X1/2 BEV (NEEDLE) IMPLANT
NEEDLE HYPO 25GX1X1/2 BEV (NEEDLE) ×3 IMPLANT
NS IRRIG 1000ML POUR BTL (IV SOLUTION) ×3 IMPLANT
PACK ORTHO EXTREMITY (CUSTOM PROCEDURE TRAY) ×3 IMPLANT
PAD ARMBOARD 7.5X6 YLW CONV (MISCELLANEOUS) ×6 IMPLANT
PAD CAST 3X4 CTTN HI CHSV (CAST SUPPLIES) IMPLANT
PAD CAST 4YDX4 CTTN HI CHSV (CAST SUPPLIES) ×3 IMPLANT
PADDING CAST COTTON 3X4 STRL (CAST SUPPLIES) ×9
PADDING CAST COTTON 4X4 STRL (CAST SUPPLIES)
PEG LOCKING SMOOTH 2.2X20 (Screw) ×4 IMPLANT
PEG LOCKING SMOOTH 2.2X22 (Screw) ×6 IMPLANT
PEG LOCKING SMOOTH 2.2X24 (Peg) ×2 IMPLANT
PILLOW ARM CARTER ADULT (MISCELLANEOUS) ×2 IMPLANT
PLATE STD DVR LEFT (Plate) ×3 IMPLANT
PLATE STD DVR LT 24X55 (Plate) IMPLANT
SCREW LOCK 14X2.7X 3 LD TPR (Screw) IMPLANT
SCREW LOCKING 2.7X14 (Screw) ×12 IMPLANT
SCREW LOCKING 2.7X15MM (Screw) ×2 IMPLANT
SCREW MULTI DIRECTIONAL 2.7X20 (Screw) ×2 IMPLANT
SOL PREP POV-IOD 4OZ 10% (MISCELLANEOUS) ×6 IMPLANT
SPLINT FIBERGLASS 3X35 (CAST SUPPLIES) ×2 IMPLANT
SPONGE LAP 4X18 RFD (DISPOSABLE) IMPLANT
SUT MNCRL AB 4-0 PS2 18 (SUTURE) ×3 IMPLANT
SUT PROLENE 3 0 PS 2 (SUTURE) IMPLANT
SUT PROLENE 4 0 PS 2 18 (SUTURE) ×2 IMPLANT
SUT VIC AB 3-0 FS2 27 (SUTURE) ×2 IMPLANT
SYR CONTROL 10ML LL (SYRINGE) IMPLANT
TOWEL GREEN STERILE (TOWEL DISPOSABLE) ×3 IMPLANT
TOWEL GREEN STERILE FF (TOWEL DISPOSABLE) ×3 IMPLANT
TUBE CONNECTING 12'X1/4 (SUCTIONS) ×1
TUBE CONNECTING 12X1/4 (SUCTIONS) ×2 IMPLANT
TUBE EVACUATION TLS (MISCELLANEOUS) ×1 IMPLANT
UNDERPAD 30X36 HEAVY ABSORB (UNDERPADS AND DIAPERS) ×3 IMPLANT
WATER STERILE IRR 1000ML POUR (IV SOLUTION) ×3 IMPLANT

## 2019-07-29 NOTE — H&P (Signed)
Jacob Sanders is an 74 y.o. male.   Chief Complaint: Comminuted complex left wrist fracture patient presents for surgical stabilization HPI: Left comminuted complex wrist fracture in the face of prior surgical reconstruction to the hand many years ago with resultant loss of motion and sensation to the extremity.  Patient presents for surgical stabilization of his left wrist fracture.    Past Medical History:  Diagnosis Date  . Arthritis   . Dysrhythmia    afib  . GERD (gastroesophageal reflux disease)   . History of kidney stones    passed  . Hyperlipidemia   . Hypertension   . Nerve damage    left hand after accident- 1995  . Seasonal allergies   . Staph infection    post back surgeries 1998 and1999    Past Surgical History:  Procedure Laterality Date  . BACK SURGERY  [id number]   discectomy  . HAND SURGERY Left 1995  . TONSILLECTOMY     childhood   . TOOTH EXTRACTION  3 weeks ago ;   had abcess beneath tooth; experiened some bleeding for 20 hours post op  . TOTAL HIP ARTHROPLASTY Left 05/22/2016   Procedure: LEFT TOTAL HIP ARTHROPLASTY ANTERIOR APPROACH;  Surgeon: Samson Frederic, MD;  Location: [facility];  Service: Orthopedics;  Laterality: Left;  Dr. requesting RNFA  . TOTAL HIP ARTHROPLASTY Right 07/17/2016   Procedure: RIGHT TOTAL HIP ARTHROPLASTY ANTERIOR APPROACH;  Surgeon: Samson Frederic, MD;  Location: [facility];  Service: Orthopedics;  Laterality: Right;  needs RNFA    Family History  Problem Relation Age of Onset  . Hypertension Mother   . Aneurysm Father   . Hypertension Father    Social History:  reports that he has been smoking cigars. He has a 45.00 pack-year smoking history. He has never used smokeless tobacco. He reports that he does not drink alcohol and does not use drugs.  Allergies:  Allergies  Allergen Reactions  . Warfarin And Related Other (See Comments)    Bleeding.  . Prednisone Swelling    Medications Prior to Admission  Medication Sig  Dispense Refill  . cetirizine (ZYRTEC) 10 MG tablet Take 1 tablet (10 mg total) by mouth daily. (Patient taking differently: Take 10 mg by mouth daily as needed for allergies. ) 30 tablet 11  . diltiazem (CARDIZEM LA) 240 MG 24 hr tablet TAKE 1 TABLET BY MOUTH  DAILY (Patient taking differently: Take 240 mg by mouth at bedtime. ) 90 tablet 3  . meclizine (ANTIVERT) 25 MG tablet TAKE 1 TABLET BY MOUTH 3  TIMES DAILY AS NEEDED FOR  DIZZINESS (Patient taking differently: Take 25 mg by mouth 3 (three) times daily as needed for dizziness. ) 180 tablet 0  . omeprazole (PRILOSEC) 20 MG capsule TAKE 1 CAPSULE BY MOUTH  DAILY AS NEEDED (Patient taking differently: Take 20 mg by mouth daily as needed (heartburn/indigestion). ) 90 capsule 3  . oxycodone (OXY-IR) 5 MG capsule Take 5 mg by mouth every 4 (four) hours as needed.    Marland Kitchen azithromycin (ZITHROMAX Z-PAK) 250 MG tablet Take 2 tablets (500 mg) PO today, then 1 tablet (250 mg) PO daily x4 days. (Patient not taking: Reported on 07/27/2019) 6 tablet 0  . cephALEXin (KEFLEX) 500 MG capsule Take 500 mg by mouth 4 (four) times daily.      Results for orders placed or performed during the hospital encounter of 07/29/19 (from the past 48 hour(s))  CBC     Status: Abnormal  Collection Time: 07/29/19  1:05 PM  Result Value Ref Range   WBC 10.2 4.0 - 10.5 K/uL   RBC 4.81 4.22 - 5.81 MIL/uL   Hemoglobin 12.9 (L) 13.0 - 17.0 g/dL   HCT 76.8 39 - 52 %   MCV 88.8 80.0 - 100.0 fL   MCH 26.8 26.0 - 34.0 pg   MCHC 30.2 30.0 - 36.0 g/dL   RDW 11.5 (H) 72.6 - 20.3 %   Platelets 240 150 - 400 K/uL   nRBC 0.0 0.0 - 0.2 %    Comment: Performed at [facility], [address], [place], [place] [address]  Basic metabolic panel per protocol     Status: Abnormal   Collection Time: 07/29/19  1:42 PM  Result Value Ref Range   Sodium 138 135 - 145 mmol/L   Potassium 3.6 3.5 - 5.1 mmol/L   Chloride 107 98 - 111 mmol/L   CO2 21 (L) 22 - 32 mmol/L   Glucose, Bld 93 70  - 99 mg/dL    Comment: Glucose reference range applies only to samples taken after fasting for at least 8 hours.   BUN 10 8 - 23 mg/dL   Creatinine, Ser 1.63 0.61 - 1.24 mg/dL   Calcium 8.5 (L) 8.9 - 10.3 mg/dL   GFR calc non Af Amer >60 >60 mL/min   GFR calc Af Amer >60 >60 mL/min   Anion gap 10 5 - 15    Comment: Performed at [facility], [address] [place], [place] [address]   DG MINI C-ARM IMAGE ONLY  Result Date: 07/29/2019 There is no interpretation for this exam.  This order is for images obtained during a surgical procedure.  Please See "Surgeries" Tab for more information regarding the procedure.    Review of Systems  Respiratory: Negative.   Gastrointestinal: Negative.   Endocrine: Negative.   Genitourinary: Negative.     Blood pressure (!) 145/76, pulse 98, temperature 98.3 F (36.8 C), temperature source Oral, resp. rate 16, height 5\' 10"  (1.778 m), weight 83.9 kg, SpO2 99 %. Physical Exam comminuted complex left wrist fracture displaced with malalignment pain and no evidence of compartment syndrome.  The patient is alert and oriented in no acute distress. The patient complains of pain in the affected upper extremity.  The patient is noted to have a normal HEENT exam. Lung fields show equal chest expansion and no shortness of breath. Abdomen exam is nontender without distention. Lower extremity examination does not show any fracture dislocation or blood clot symptoms. Pelvis is stable and the neck and back are stable and nontender.  Assessment/Plan We will plan for surgical stabilization reconstruction left wrist fracture with repair as necessary.  I discussed all issues plans and concerns. We are planning surgery for your upper extremity. The risk and benefits of surgery to include risk of bleeding, infection, anesthesia,  damage to normal structures and failure of the surgery to accomplish its intended goals of relieving symptoms and restoring function  have been discussed in detail. With this in mind we plan to proceed. I have specifically discussed with the patient the pre-and postoperative regime and the dos and don'ts and risk and benefits in great detail. Risk and benefits of surgery also include risk of dystrophy(CRPS), chronic nerve pain, failure of the healing process to go onto completion and other inherent risks of surgery The relavent the pathophysiology of the disease/injury process, as well as the alternatives for treatment and postoperative course  of action has been discussed in great detail with the patient who desires to proceed.  We will do everything in our power to help you (the patient) restore function to the upper extremity. It is a pleasure to see this patient today.  Oletta Cohn III, MD 07/29/2019, 3:58 PM

## 2019-07-29 NOTE — Discharge Instructions (Signed)
Please elevate move massage her fingers as best you can.  Please keep your bandage clean and dry.  We will call for your follow-up appointment to be seen in 14 days.  If you have any problems you may call Dr. Amanda Pea on his cell phone at [phone].  Please expect a lot of discomfort after your numbing medicine wears off- that is the block.  Keep bandage clean and dry.  Call for any problems.  No smoking.  Criteria for driving a car: you should be off your pain medicine for 7-8 hours, able to drive one handed(confident), thinking clearly and feeling able in your judgement to drive. Continue elevation as it will decrease swelling.  If instructed by MD move your fingers within the confines of the bandage/splint.  Use ice if instructed by your MD. Call immediately for any sudden loss of feeling in your hand/arm or change in functional abilities of the extremity.We recommend that you to take vitamin C 1000 mg a day to promote healing. We also recommend that if you require  pain medicine that you take a stool softener to prevent constipation as most pain medicines will have constipation side effects. We recommend either Peri-Colace or Senokot and recommend that you also consider adding MiraLAX as well to prevent the constipation affects from pain medicine if you are required to use them. These medicines are over the counter and may be purchased at a local pharmacy. A cup of yogurt and a probiotic can also be helpful during the recovery process as the medicines can disrupt your intestinal environment.

## 2019-07-29 NOTE — Transfer of Care (Signed)
Immediate Anesthesia Transfer of Care Note  Patient: Jacob Sanders  Procedure(s) Performed: OPEN REDUCTION INTERNAL FIXATION (ORIF) LEFT DISTAL RADIAL FRACTURE (Left Arm Lower)  Patient Location: PACU  Anesthesia Type:MAC  Level of Consciousness: awake  Airway & Oxygen Therapy: Patient Spontanous Breathing  Post-op Assessment: Report given to RN and Post -op Vital signs reviewed and stable  Post vital signs: Reviewed and stable  Last Vitals:  Vitals Value Taken Time  BP    Temp    Pulse    Resp    SpO2      Last Pain:  Vitals:   07/29/19 1336  TempSrc:   PainSc: 0-No pain      Patients Stated Pain Goal: 3 (29/52/84 1324)  Complications: No complications documented.

## 2019-07-29 NOTE — Anesthesia Procedure Notes (Signed)
Anesthesia Regional Block: Axillary brachial plexus block   Pre-Anesthetic Checklist: ,, timeout performed, Correct Patient, Correct Site, Correct Laterality, Correct Procedure, Correct Position, site marked, Risks and benefits discussed,  Surgical consent,  Pre-op evaluation,  At surgeon's request and post-op pain management  Laterality: Left  Prep: chloraprep       Needles:  Injection technique: Single-shot  Needle Type: Echogenic Needle     Needle Length: 9cm  Needle Gauge: 21     Additional Needles:   Procedures:,,,, ultrasound used (permanent image in chart),,,,  Narrative:  Start time: 07/29/2019 2:15 PM End time: 07/29/2019 2:25 PM Injection made incrementally with aspirations every 5 mL.  Performed by: Personally  Anesthesiologist: Cecile Hearing, MD  Additional Notes: No pain on injection. No increased resistance to injection. Injection made in 5cc increments.  Good needle visualization.  Patient tolerated procedure well.

## 2019-07-29 NOTE — Op Note (Signed)
Operative note-July 29, 2019  Jacob Severin Sanders  Preoperative diagnosis: Left distal radius fracture comminuted complex greater than 3 part intra-articular  Postop diagnosis: Same  Procedure: #1 left wrist open reduction internal fixation comminuted complex distal radius fracture with DVR [employer] cross lock plate and screw construct.  This was a greater than 3 part intra-articular fracture.  #2 AP lateral and oblique x-rays performed examined and interpreted by myself  Jacob Sanders  Anesthesia: Block with IV sedation  Estimated blood loss minimal  Complications none immediate  Operative indications the patient presents for evaluation and surgical care.  Patient understands risk benefits and desires to proceed.  We have discussed with the patient all issues plans and concerns with this in mind we will proceed accordingly. We are planning surgery for your upper extremity. The risk and benefits of surgery to include risk of bleeding, infection, anesthesia,  damage to normal structures and failure of the surgery to accomplish its intended goals of relieving symptoms and restoring function have been discussed in detail. With this in mind we plan to proceed. I have specifically discussed with the patient the pre-and postoperative regime and the dos and don'ts and risk and benefits in great detail. Risk and benefits of surgery also include risk of dystrophy(CRPS), chronic nerve pain, failure of the healing process to go onto completion and other inherent risks of surgery The relavent the pathophysiology of the disease/injury process, as well as the alternatives for treatment and postoperative course of action has been discussed in great detail with the patient who desires to proceed.  We will do everything in our power to help you (the patient) restore function to the upper extremity. It is a pleasure to see this patient today.    Operative procedure: Patient was seen by myself and anesthesia.   Appropriate anesthesia was induced and following this the patient was prepped with a Hibiclens pre-scrub followed by 10-minute surgical Betadine scrub and paint.  Once this was completed the extremity was elevated and the tourniquet was insufflated to 250 mmHg.  Timeout was observed preoperative antibiotics were given and the patient then underwent a very careful and cautious approach to the extremity with volar radial incision under 250 mm tourniquet control.  FCR tendon sheath was identified and dissected.  There were no complicating features.  Once this was completed the carpal canal contents were retracted ulnarly and the FCR was retracted radially.  We took very meticulous care of the radial artery and the carpal canal contents during the approach.  The pronator was accessed incised and lifted off of the fracture.  The fracture was then reassembled with standard orthopedic equipment and a DVR plate and screw construct from [employer] was accomplished in terms of placement and fixation of the fracture.  We used a volar rim DVR plate due to the distal and comminuted nature.  Adequate radial height, volar tilt and radial inclination was restored.  The distal radial ulnar joint, radiocarpal and midcarpal joints all were  stable and satisfactory.  We irrigated copiously and closed the pronator with 3-0 Vicryl followed by closure of the skin edge with Prolene.  Once again, the distal radius underwent open reduction internal fixation without complications.  The distal radial ulnar joint was stable.  The patient had no complications.  All radiographic parameters look quite well following the fixation.  Standard dressing of Adaptic Xeroform 4 x 4's gauze web roll Kerlix and a volar splint were applied.  The patient understands instructions of elevate move  massage fingers notify us any problems occur and follow-up care according to our standard protocol for a DVR plate and screw construct.  He has been a  pleasure participate in the patient's care and we look forward to spent in the patient's recovery. The patient will be DC'd home today.  He has antibiotics pain medicine and muscle relaxer.  I discussed all issues with his wife.  Should any problems occur I will be immediately available.  We will rehab him according to the standard protocol however given his pre-existing deformity about the fingers he will have limited motion about the fingers.  Our goal is to get him back to his baseline prior to his fracture of course. Jacob Severin Sanders

## 2019-07-30 NOTE — Anesthesia Postprocedure Evaluation (Signed)
Anesthesia Post Note  Patient: Jacob Sanders  Procedure(s) Performed: OPEN REDUCTION INTERNAL FIXATION (ORIF) LEFT DISTAL RADIAL FRACTURE (Left Arm Lower)     Patient location during evaluation: PACU Anesthesia Type: Regional Level of consciousness: awake and alert Pain management: pain level controlled Vital Signs Assessment: post-procedure vital signs reviewed and stable Respiratory status: spontaneous breathing Cardiovascular status: stable Anesthetic complications: no   No complications documented.  Last Vitals:  Vitals:   07/29/19 1726 07/29/19 1732  BP:  (!) 152/95  Pulse: 72 78  Resp: 15 15  Temp:  36.5 C  SpO2: 98% 100%    Last Pain:  Vitals:   07/29/19 1732  TempSrc:   PainSc: 0-No pain                 Lewie Loron

## 2019-08-01 ENCOUNTER — Encounter (HOSPITAL_COMMUNITY): Payer: Self-pay | Admitting: Orthopedic Surgery

## 2019-08-04 ENCOUNTER — Ambulatory Visit (INDEPENDENT_AMBULATORY_CARE_PROVIDER_SITE_OTHER): Payer: Medicare Other

## 2019-08-04 ENCOUNTER — Ambulatory Visit (INDEPENDENT_AMBULATORY_CARE_PROVIDER_SITE_OTHER): Payer: Medicare Other | Admitting: Nurse Practitioner

## 2019-08-04 ENCOUNTER — Encounter: Payer: Self-pay | Admitting: Nurse Practitioner

## 2019-08-04 ENCOUNTER — Other Ambulatory Visit: Payer: Self-pay

## 2019-08-04 VITALS — BP 160/98 | HR 82 | Temp 97.8°F | Resp 20 | Ht 70.0 in | Wt 190.0 lb

## 2019-08-04 DIAGNOSIS — S20212A Contusion of left front wall of thorax, initial encounter: Secondary | ICD-10-CM | POA: Diagnosis not present

## 2019-08-04 DIAGNOSIS — W19XXXD Unspecified fall, subsequent encounter: Secondary | ICD-10-CM

## 2019-08-04 DIAGNOSIS — R0781 Pleurodynia: Secondary | ICD-10-CM | POA: Diagnosis not present

## 2019-08-04 DIAGNOSIS — R079 Chest pain, unspecified: Secondary | ICD-10-CM | POA: Diagnosis not present

## 2019-08-04 IMAGING — DX DG CHEST 2V
2 series · 2 of 2 positions shown · non-contrast
Comparison: [DATE]

CLINICAL DATA: Chest pain following a fall 8 days previously.
Smoker.

EXAM:
CHEST - 2 VIEW

[chest lat]
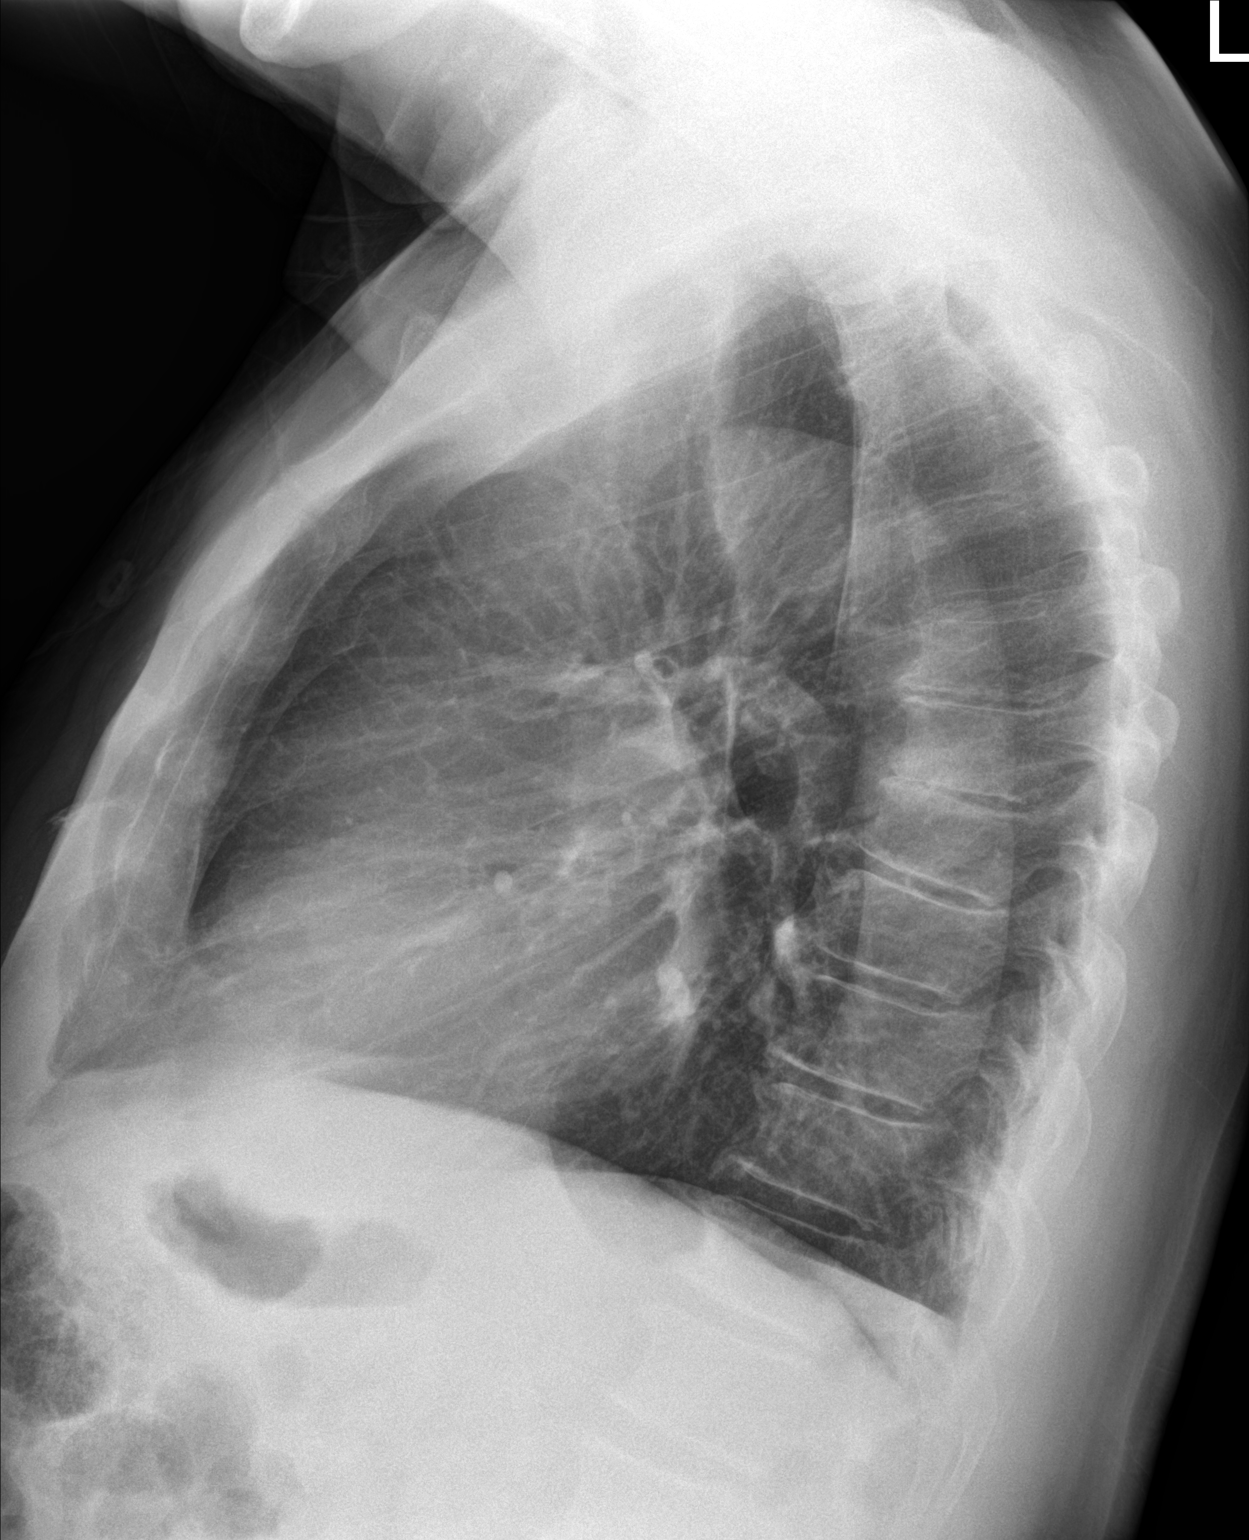

[chest pa]
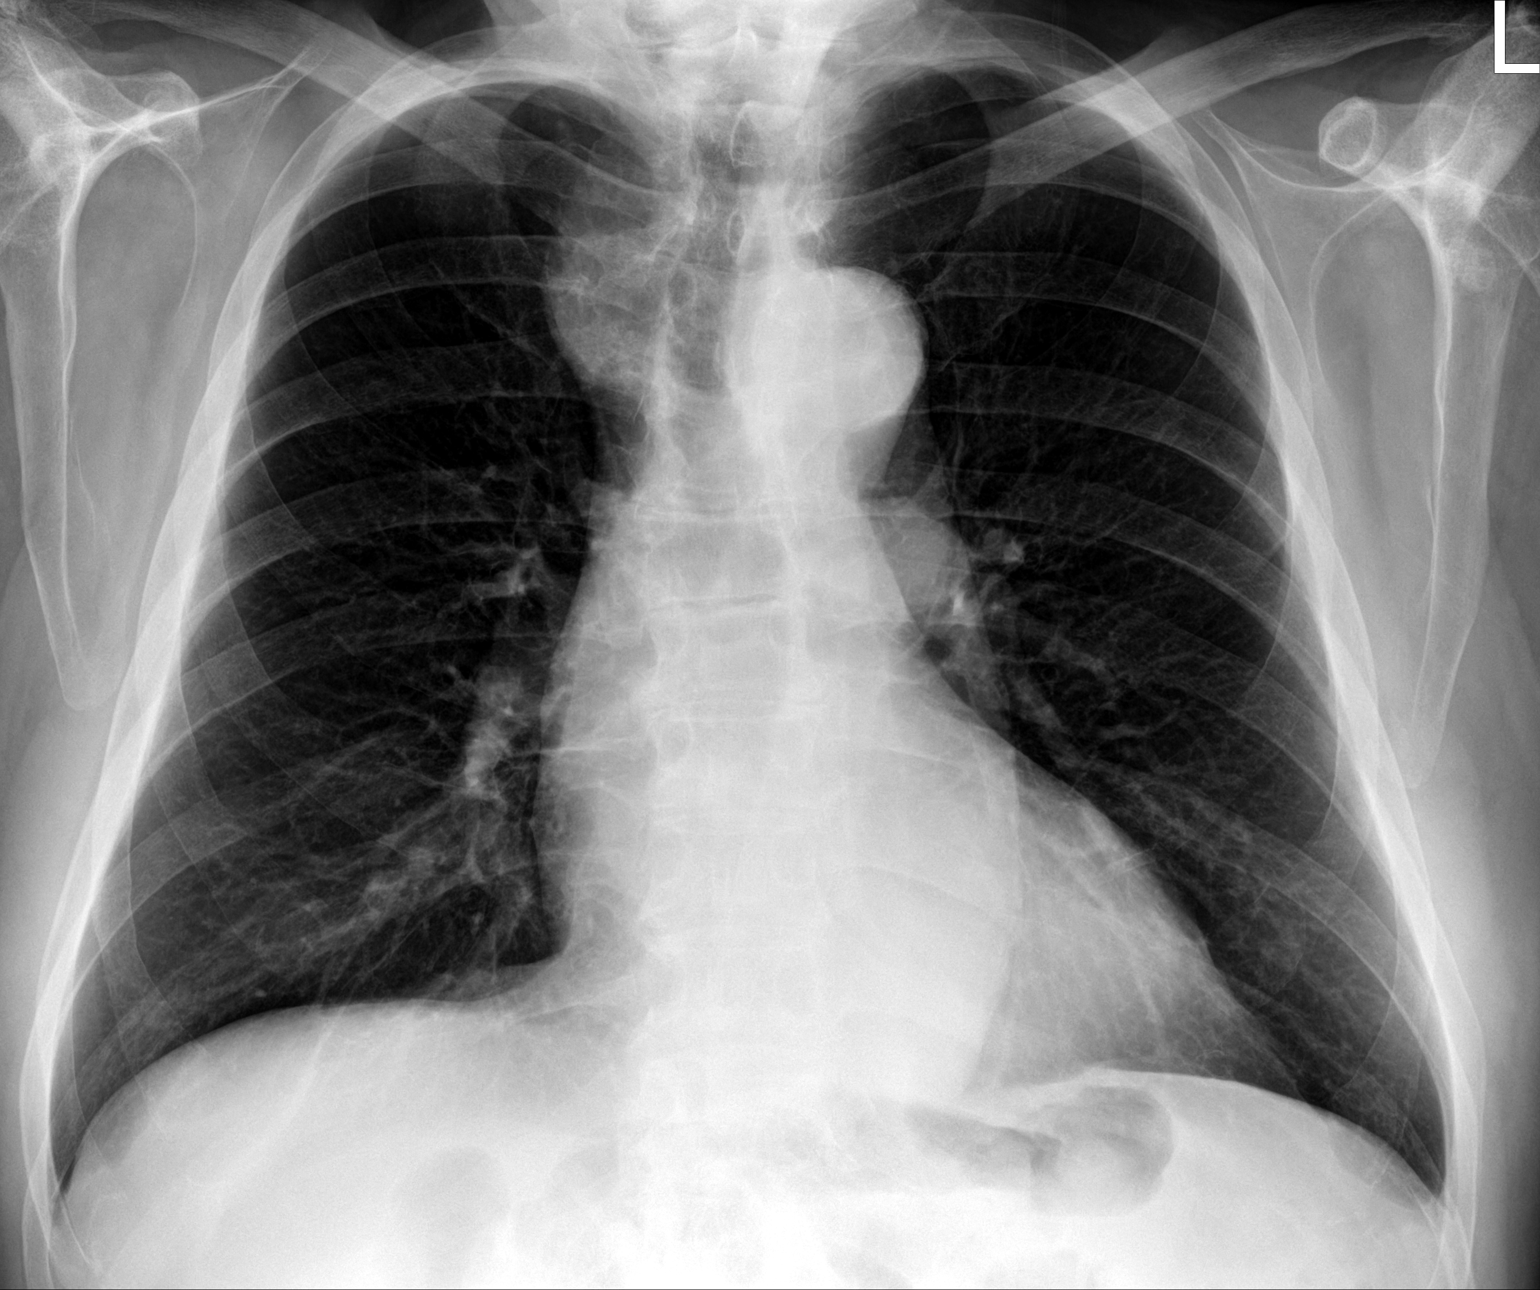

[2 of 2 positions shown; findings below may reference images not displayed]

FINDINGS: The cardiac silhouette remains near the upper limit of normal in
size. The aorta remains tortuous. Clear lungs. The lungs are mildly
hyperexpanded with mild peribronchial thickening. Thoracic spine
degenerative changes. No fracture or pneumothorax seen.
IMPRESSION: No acute abnormality.

## 2019-08-04 NOTE — Progress Notes (Signed)
Acute Office Visit  Subjective:    Patient ID: Jacob Sanders, male    DOB: July 14, 1945, 74 y.o.   MRN: [id number]  Chief Complaint  Patient presents with  . chest pain from fall    Chest Pain  This is a new problem. The current episode started in the past 7 days. The onset quality is gradual (Patient fell ). The problem occurs constantly. The problem has been unchanged. The pain is at a severity of 6/10. The quality of the pain is described as dull. Radiates to: left flank area  He has tried acetaminophen for the symptoms. The treatment provided mild relief. Risk factors: fall. Prior workup: Nothing.     Past Medical History:  Diagnosis Date  . Arthritis   . Dysrhythmia    afib  . GERD (gastroesophageal reflux disease)   . History of kidney stones    passed  . Hyperlipidemia   . Hypertension   . Nerve damage    left hand after accident- 1995  . Seasonal allergies   . Staph infection    post back surgeries 1998 and1999    Past Surgical History:  Procedure Laterality Date  . BACK SURGERY  [phone]   discectomy  . HAND SURGERY Left 1995  . OPEN REDUCTION INTERNAL FIXATION (ORIF) DISTAL RADIAL FRACTURE Left 07/29/2019   Procedure: OPEN REDUCTION INTERNAL FIXATION (ORIF) LEFT DISTAL RADIAL FRACTURE;  Surgeon: Dominica Severin, MD;  Location: [facility] OR;  Service: Orthopedics;  Laterality: Left;  90 mins  . TONSILLECTOMY     childhood   . TOOTH EXTRACTION  3 weeks ago ;   had abcess beneath tooth; experiened some bleeding for 20 hours post op  . TOTAL HIP ARTHROPLASTY Left 05/22/2016   Procedure: LEFT TOTAL HIP ARTHROPLASTY ANTERIOR APPROACH;  Surgeon: Samson Frederic, MD;  Location: [facility];  Service: Orthopedics;  Laterality: Left;  Dr. requesting RNFA  . TOTAL HIP ARTHROPLASTY Right 07/17/2016   Procedure: RIGHT TOTAL HIP ARTHROPLASTY ANTERIOR APPROACH;  Surgeon: Samson Frederic, MD;  Location: [facility];  Service: Orthopedics;  Laterality: Right;  needs RNFA    Family History    Problem Relation Age of Onset  . Hypertension Mother   . Aneurysm Father   . Hypertension Father     Social History   Socioeconomic History  . Marital status: Married    Spouse name: Steffanie Dunn  . Number of children: 3  . Years of education: 10  . Highest education level: 10th grade  Occupational History  . Occupation: Retired  Tobacco Use  . Smoking status: Current Every Day Smoker    Packs/day: 1.00    Years: 45.00    Pack years: 45.00    Types: Cigars  . Smokeless tobacco: Never Used  Vaping Use  . Vaping Use: Never used  Substance and Sexual Activity  . Alcohol use: No  . Drug use: No  . Sexual activity: Not Currently  Other Topics Concern  . Not on file  Social History Narrative  . Not on file   Social Determinants of Health   Financial Resource Strain:   . Difficulty of Paying Living Expenses:   Food Insecurity:   . Worried About Programme researcher, broadcasting/film/video in the Last Year:   . Barista in the Last Year:   Transportation Needs:   . Freight forwarder (Medical):   Marland Kitchen Lack of Transportation (Non-Medical):   Physical Activity:   . Days of Exercise per Week:   .  Minutes of Exercise per Session:   Stress:   . Feeling of Stress :   Social Connections:   . Frequency of Communication with Friends and Family:   . Frequency of Social Gatherings with Friends and Family:   . Attends Religious Services:   . Active Member of Clubs or Organizations:   . Attends Banker Meetings:   Marland Kitchen Marital Status:   Intimate Partner Violence:   . Fear of Current or Ex-Partner:   . Emotionally Abused:   Marland Kitchen Physically Abused:   . Sexually Abused:     Outpatient Medications Prior to Visit  Medication Sig Dispense Refill  . cephALEXin (KEFLEX) 500 MG capsule Take 500 mg by mouth 4 (four) times daily.    . cetirizine (ZYRTEC) 10 MG tablet Take 1 tablet (10 mg total) by mouth daily. (Patient taking differently: Take 10 mg by mouth daily as needed for allergies. ) 30  tablet 11  . diltiazem (CARDIZEM LA) 240 MG 24 hr tablet TAKE 1 TABLET BY MOUTH  DAILY (Patient taking differently: Take 240 mg by mouth at bedtime. ) 90 tablet 3  . meclizine (ANTIVERT) 25 MG tablet TAKE 1 TABLET BY MOUTH 3  TIMES DAILY AS NEEDED FOR  DIZZINESS (Patient taking differently: Take 25 mg by mouth 3 (three) times daily as needed for dizziness. ) 180 tablet 0  . omeprazole (PRILOSEC) 20 MG capsule TAKE 1 CAPSULE BY MOUTH  DAILY AS NEEDED (Patient taking differently: Take 20 mg by mouth daily as needed (heartburn/indigestion). ) 90 capsule 3  . oxycodone (OXY-IR) 5 MG capsule Take 5 mg by mouth every 4 (four) hours as needed.    . methocarbamol (ROBAXIN) 500 MG tablet Take 500 mg by mouth.    . oxyCODONE (OXY IR/ROXICODONE) 5 MG immediate release tablet Take 5 mg by mouth every 6 (six) hours as needed.     No facility-administered medications prior to visit.    Allergies  Allergen Reactions  . Warfarin And Related Other (See Comments)    Bleeding.  . Prednisone Swelling    Review of Systems  Constitutional: Negative.   HENT: Negative.   Eyes: Negative.   Respiratory: Negative.   Cardiovascular: Positive for chest pain.  Gastrointestinal: Negative.   Genitourinary: Negative.   Musculoskeletal: Positive for joint swelling and myalgias.       Flank pain  Skin: Negative for color change and wound.  Neurological: Negative.   Psychiatric/Behavioral: The patient is not nervous/anxious.        Objective:    Physical Exam Constitutional:      Appearance: Normal appearance.  HENT:     Head: Normocephalic.  Eyes:     Conjunctiva/sclera: Conjunctivae normal.  Cardiovascular:     Rate and Rhythm: Normal rate and regular rhythm.     Pulses: Normal pulses.     Heart sounds: Normal heart sounds.  Pulmonary:     Effort: Pulmonary effort is normal.     Breath sounds: Normal breath sounds.  Abdominal:     General: Bowel sounds are normal.  Musculoskeletal:        General:  Swelling and tenderness present.  Skin:    Findings: No erythema.  Neurological:     Mental Status: He is alert and oriented to person, place, and time.     BP (!) 160/98   Pulse 82   Temp 97.8 F (36.6 C)   Resp 20   Ht 5\' 10"  (1.778 m)   Wt 190 lb (  86.2 kg)   SpO2 96%   BMI 27.26 kg/m  Wt Readings from Last 3 Encounters:  08/04/19 190 lb (86.2 kg)  07/29/19 185 lb (83.9 kg)  03/30/18 199 lb (90.3 kg)    Health Maintenance Due  Topic Date Due  . COVID-19 Vaccine (1) Never done  . COLONOSCOPY  Never done  . COLON CANCER SCREENING ANNUAL FOBT  03/27/2017    There are no preventive care reminders to display for this patient.   Lab Results  Component Value Date   TSH 1.830 03/10/2014   Lab Results  Component Value Date   WBC 10.2 07/29/2019   HGB 12.9 (L) 07/29/2019   HCT 42.7 07/29/2019   MCV 88.8 07/29/2019   PLT 240 07/29/2019   Lab Results  Component Value Date   NA 138 07/29/2019   K 3.6 07/29/2019   CO2 21 (L) 07/29/2019   GLUCOSE 93 07/29/2019   BUN 10 07/29/2019   CREATININE 0.85 07/29/2019   BILITOT 0.6 01/26/2019   ALKPHOS 96 01/26/2019   AST 15 01/26/2019   ALT 9 01/26/2019   PROT 6.4 01/26/2019   ALBUMIN 4.0 01/26/2019   CALCIUM 8.5 (L) 07/29/2019   ANIONGAP 10 07/29/2019   Lab Results  Component Value Date   CHOL 190 02/25/2017   Lab Results  Component Value Date   HDL 52 02/25/2017   Lab Results  Component Value Date   LDLCALC 118 (H) 02/25/2017   Lab Results  Component Value Date   TRIG 98 02/25/2017   Lab Results  Component Value Date   CHOLHDL 3.7 02/25/2017   Lab Results  Component Value Date   HGBA1C 5.5 03/21/2016       Assessment & Plan:  Contusion of rib on left side Patient is a 74 year old male who presents to clinic today with contusion of rib on the left side.  Patient had a fall 7 days ago.  Patient broke his left arm and had surgery.  Patient is reporting pain from fall and noticed a few days ago  increased pain on his left lateral rib.  Pain is worse with inhalation.  Patient had no prior imaging done to check for broken ribs. After assessment today, diagnostic imaging x-ray ordered.  Advised patient to continue on pain medication provided in the hospital.  Advised patient to use warm compress, breathing exercises to avoid pneumonia.  Guarding chest wall while coughing and deep breathing. Education provided with printed handouts given to patient. Patient to follow-up with worsening or unresolved symptoms.  Problem List Items Addressed This Visit    None    Visit Diagnoses    Fall, subsequent encounter    -  Primary   Relevant Orders   DG Chest 2 View   Rib pain on left side       Relevant Orders   DG Chest 2 View          Daryll Drown, NP

## 2019-08-04 NOTE — Patient Instructions (Addendum)
Contusion of rib on left side Patient is a 74 year old male who presents to clinic today with contusion of rib on the left side.  Patient had a fall 7 days ago.  Patient broke his left arm and had surgery.  Patient is reporting pain from fall and noticed a few days ago increased pain on his left lateral rib.  Pain is worse with inhalation.  Patient had no prior imaging done to check for broken ribs. After assessment today, diagnostic imaging x-ray ordered.  Advised patient to continue on pain medication provided in the hospital.  Advised patient to use warm compress, breathing exercises to avoid pneumonia.  Guarding chest wall while coughing and deep breathing. Education provided with printed handouts given to patient. Patient to follow-up with worsening or unresolved symptoms.   Rib Contusion A rib contusion is a deep bruise on your rib area. Contusions are the result of a blunt trauma that causes bleeding and injury to the tissues under the skin. A rib contusion may involve bruising of the ribs and of the skin and muscles in the area. The skin over the contusion may turn blue, purple, or yellow. Minor injuries will give you a painless contusion. More severe contusions may stay painful and swollen for a few weeks. What are the causes? This condition is usually caused by a blow, trauma, or direct force to an area of the body. This often occurs while playing contact sports. What are the signs or symptoms? Symptoms of this condition include:  Swelling and redness of the injured area.  Discoloration of the injured area.  Tenderness and soreness of the injured area.  Pain with or without movement. How is this diagnosed? This condition may be diagnosed based on:  Your symptoms and medical history.  A physical exam.  Imaging tests--such as an X-ray, CT scan, or MRI--to determine if there were internal injuries or broken bones (fractures). How is this treated? This condition may be treated  with:  Rest. This is often the best treatment for a rib contusion.  Icing. This reduces swelling and inflammation.  Deep-breathing exercises. These may be recommended to reduce the risk for lung collapse and pneumonia.  Medicines. Over-the-counter or prescription medicines may be given to control pain.  Injection of a numbing medicine around the nerve near your injury (nerve block). Follow these instructions at home:     Medicines  Take over-the-counter and prescription medicines only as told by your health care provider.  Do not drive or use heavy machinery while taking prescription pain medicine.  If you are taking prescription pain medicine, take actions to prevent or treat constipation. Your health care provider may recommend that you: ? Drink enough fluid to keep your urine pale yellow. ? Eat foods that are high in fiber, such as fresh fruits and vegetables, whole grains, and beans. ? Limit foods that are high in fat and processed sugars, such as fried or sweet foods. ? Take an over-the-counter or prescription medicine for constipation. Managing pain, stiffness, and swelling  If directed, put ice on the injured area: ? Put ice in a plastic bag. ? Place a towel between your skin and the bag. ? Leave the ice on for 20 minutes, 2-3 times a day.  Rest the injured area. Avoid strenuous activity and any activities or movements that cause pain. Be careful during activities and avoid bumping the injured area.  Do not lift anything that is heavier than 5 lb (2.3 kg), or the limit that you  are told, until your health care provider says that it is safe. General instructions  Do not use any products that contain nicotine or tobacco, such as cigarettes and e-cigarettes. These can delay healing. If you need help quitting, ask your health care provider.  Do deep-breathing exercises as told by your health care provider.  If you were given an incentive spirometer, use it every 1-2 hours  while you are awake, or as recommended by your health care provider. This device measures how well you are filling your lungs with each breath.  Keep all follow-up visits as told by your health care provider. This is important. Contact a health care provider if you have:  Increased bruising or swelling.  Pain that is not controlled with treatment.  A fever. Get help right away if you:  Have difficulty breathing or shortness of breath.  Develop a continual cough or you cough up thick or bloody sputum.  Feel nauseous or you vomit.  Have pain in your abdomen. Summary  A rib contusion is a deep bruise on your rib area. Contusions are the result of a blunt trauma that causes bleeding and injury to the tissues under the skin.  The skin overlying the contusion may turn blue, purple, or yellow. Minor injuries may give you a painless contusion. More severe contusions may stay painful and swollen for a few weeks.  Rest the injured area. Avoid strenuous activity and any activities or movements that cause pain. This information is not intended to replace advice given to you by your health care provider. Make sure you discuss any questions you have with your health care provider. Document Revised: 01/28/2017 Document Reviewed: 01/28/2017 Elsevier Patient Education  2020 [employer].

## 2019-08-04 NOTE — Assessment & Plan Note (Addendum)
Patient is a 74 year old male who presents to clinic today with contusion of rib on the left side.  Patient had a fall 7 days ago.  Patient broke his left arm and had surgery.  Patient is reporting pain from fall and noticed a few days ago increased pain on his left lateral rib.  Pain is worse with inhalation.  Patient had no prior imaging done to check for broken ribs. After assessment today, diagnostic imaging x-ray ordered.  Advised patient to continue on pain medication provided in the hospital.  Advised patient to use warm compress, breathing exercises to avoid pneumonia.  Guarding chest wall while coughing and deep breathing. Education provided with printed handouts given to patient. Patient to follow-up with worsening or unresolved symptoms.

## 2019-08-11 DIAGNOSIS — Z4789 Encounter for other orthopedic aftercare: Secondary | ICD-10-CM | POA: Diagnosis not present

## 2019-08-11 DIAGNOSIS — S52502A Unspecified fracture of the lower end of left radius, initial encounter for closed fracture: Secondary | ICD-10-CM | POA: Diagnosis not present

## 2019-08-30 DIAGNOSIS — S52502A Unspecified fracture of the lower end of left radius, initial encounter for closed fracture: Secondary | ICD-10-CM | POA: Diagnosis not present

## 2019-09-28 DIAGNOSIS — S52502D Unspecified fracture of the lower end of left radius, subsequent encounter for closed fracture with routine healing: Secondary | ICD-10-CM | POA: Diagnosis not present

## 2019-11-30 ENCOUNTER — Other Ambulatory Visit: Payer: Self-pay | Admitting: Family

## 2019-11-30 DIAGNOSIS — I10 Essential (primary) hypertension: Secondary | ICD-10-CM

## 2019-11-30 DIAGNOSIS — I4891 Unspecified atrial fibrillation: Secondary | ICD-10-CM

## 2019-12-01 NOTE — Telephone Encounter (Signed)
[facility] NT[facility] Last OV for chronic issues was Jan 21. Mail order not sent

## 2019-12-29 ENCOUNTER — Other Ambulatory Visit: Payer: Self-pay | Admitting: Family

## 2019-12-29 DIAGNOSIS — I10 Essential (primary) hypertension: Secondary | ICD-10-CM

## 2019-12-29 DIAGNOSIS — I4891 Unspecified atrial fibrillation: Secondary | ICD-10-CM

## 2020-03-18 ENCOUNTER — Other Ambulatory Visit: Payer: Self-pay | Admitting: Family

## 2020-03-18 DIAGNOSIS — I4891 Unspecified atrial fibrillation: Secondary | ICD-10-CM

## 2020-03-18 DIAGNOSIS — I10 Essential (primary) hypertension: Secondary | ICD-10-CM

## 2020-03-19 ENCOUNTER — Other Ambulatory Visit: Payer: Self-pay | Admitting: Family

## 2020-03-19 DIAGNOSIS — I4891 Unspecified atrial fibrillation: Secondary | ICD-10-CM

## 2020-03-19 DIAGNOSIS — I10 Essential (primary) hypertension: Secondary | ICD-10-CM

## 2020-03-19 NOTE — Telephone Encounter (Signed)
[facility] NT[facility] Last Ov 01/26/19 for med management

## 2020-03-20 ENCOUNTER — Other Ambulatory Visit: Payer: Self-pay | Admitting: Family

## 2020-03-20 DIAGNOSIS — I4891 Unspecified atrial fibrillation: Secondary | ICD-10-CM

## 2020-03-20 DIAGNOSIS — I10 Essential (primary) hypertension: Secondary | ICD-10-CM

## 2020-03-21 ENCOUNTER — Other Ambulatory Visit: Payer: Self-pay | Admitting: Family

## 2020-03-21 DIAGNOSIS — I4891 Unspecified atrial fibrillation: Secondary | ICD-10-CM

## 2020-03-21 DIAGNOSIS — I10 Essential (primary) hypertension: Secondary | ICD-10-CM

## 2020-03-22 ENCOUNTER — Other Ambulatory Visit: Payer: Self-pay | Admitting: Family

## 2020-03-22 DIAGNOSIS — I10 Essential (primary) hypertension: Secondary | ICD-10-CM

## 2020-03-22 DIAGNOSIS — I4891 Unspecified atrial fibrillation: Secondary | ICD-10-CM

## 2020-04-26 ENCOUNTER — Other Ambulatory Visit: Payer: Self-pay | Admitting: Family

## 2020-05-03 ENCOUNTER — Other Ambulatory Visit: Payer: Self-pay

## 2020-05-03 ENCOUNTER — Ambulatory Visit (INDEPENDENT_AMBULATORY_CARE_PROVIDER_SITE_OTHER): Payer: Medicare Other | Admitting: Family

## 2020-05-03 ENCOUNTER — Encounter: Payer: Self-pay | Admitting: Family

## 2020-05-03 VITALS — BP 125/67 | HR 75 | Temp 97.3°F | Ht 70.0 in | Wt 198.8 lb

## 2020-05-03 DIAGNOSIS — M1990 Unspecified osteoarthritis, unspecified site: Secondary | ICD-10-CM | POA: Diagnosis not present

## 2020-05-03 DIAGNOSIS — Z0000 Encounter for general adult medical examination without abnormal findings: Secondary | ICD-10-CM

## 2020-05-03 DIAGNOSIS — J019 Acute sinusitis, unspecified: Secondary | ICD-10-CM | POA: Diagnosis not present

## 2020-05-03 DIAGNOSIS — R300 Dysuria: Secondary | ICD-10-CM

## 2020-05-03 DIAGNOSIS — E663 Overweight: Secondary | ICD-10-CM

## 2020-05-03 DIAGNOSIS — I10 Essential (primary) hypertension: Secondary | ICD-10-CM | POA: Diagnosis not present

## 2020-05-03 DIAGNOSIS — E559 Vitamin D deficiency, unspecified: Secondary | ICD-10-CM | POA: Diagnosis not present

## 2020-05-03 DIAGNOSIS — F17200 Nicotine dependence, unspecified, uncomplicated: Secondary | ICD-10-CM

## 2020-05-03 DIAGNOSIS — E785 Hyperlipidemia, unspecified: Secondary | ICD-10-CM | POA: Diagnosis not present

## 2020-05-03 DIAGNOSIS — K219 Gastro-esophageal reflux disease without esophagitis: Secondary | ICD-10-CM | POA: Diagnosis not present

## 2020-05-03 DIAGNOSIS — Z0001 Encounter for general adult medical examination with abnormal findings: Secondary | ICD-10-CM

## 2020-05-03 DIAGNOSIS — R399 Unspecified symptoms and signs involving the genitourinary system: Secondary | ICD-10-CM | POA: Diagnosis not present

## 2020-05-03 DIAGNOSIS — F411 Generalized anxiety disorder: Secondary | ICD-10-CM

## 2020-05-03 LAB — URINALYSIS, ROUTINE W REFLEX MICROSCOPIC
Bilirubin, UA: NEGATIVE
Glucose, UA: NEGATIVE
Leukocytes,UA: NEGATIVE
Nitrite, UA: NEGATIVE
Protein,UA: NEGATIVE
Specific Gravity, UA: >1.03 — ABNORMAL HIGH (ref 1.005–1.030)
Urobilinogen, Ur: 0.2 mg/dL (ref 0.2–1.0)
pH, UA: 5 (ref 5.0–7.5)

## 2020-05-03 LAB — MICROSCOPIC EXAMINATION: Epithelial Cells (non renal): NONE SEEN /hpf (ref 0–10)

## 2020-05-03 MED ORDER — AMOXICILLIN-POT CLAVULANATE 875-125 MG PO TABS: 1.0000 | ORAL_TABLET | Freq: Two times a day (BID) | ORAL | 0 refills | Status: DC

## 2020-05-03 NOTE — Patient Instructions (Signed)

## 2020-05-03 NOTE — Progress Notes (Signed)
Subjective:    Patient ID: Jacob Sanders, male    DOB: 1945-06-12, 75 y.o.   MRN: [id number]  Chief Complaint  Patient presents with  . Hypertension    With exertion, sometimes forgets to take medicine for BP. Wants to do regular labs.   . taste change  . sinus issues  . Atrial Fibrillation    Should he take BP pill?   Pt calls the office today for chronic follow up. He see's the [place] once a year.  Hypertension This is a chronic problem. The current episode started more than 1 year ago. The problem has been waxing and waning since onset. The problem is controlled. Associated symptoms include anxiety, headaches and malaise/fatigue. Pertinent negatives include no palpitations, peripheral edema or shortness of breath. Risk factors for coronary artery disease include dyslipidemia and male gender. The current treatment provides moderate improvement. There is no history of heart failure.  Atrial Fibrillation Presents for follow-up visit. Symptoms include hypertension. Symptoms are negative for pacemaker problem, palpitations and shortness of breath. The symptoms have been stable. Past medical history includes atrial fibrillation and hyperlipidemia.  Gastroesophageal Reflux He complains of belching, coughing, a hoarse voice and a sore throat. This is a chronic problem. The current episode started more than 1 year ago. The problem occurs occasionally. The problem has been waxing and waning. The symptoms are aggravated by certain foods. He has tried a PPI for the symptoms. The treatment provided moderate relief.  Arthritis Presents for follow-up visit. He complains of pain and stiffness. The symptoms have been stable. Affected locations include the right knee, left hip, right hip, left knee, right wrist and left wrist. His pain is at a severity of 5/10. Associated symptoms include dysuria.  Hyperlipidemia This is a chronic problem. The current episode started more than 1 year ago. Exacerbating  diseases include obesity. Pertinent negatives include no shortness of breath. Current antihyperlipidemic treatment includes diet change. The current treatment provides no improvement of lipids. Risk factors for coronary artery disease include dyslipidemia, hypertension, male sex and a sedentary lifestyle.  Anxiety Presents for follow-up visit. Symptoms include depressed mood, excessive worry, irritability and nervous/anxious behavior. Patient reports no palpitations or shortness of breath. Symptoms occur most days.    Constipation This is a chronic problem. The current episode started more than 1 year ago. The problem has been waxing and waning since onset. His stool frequency is 1 time per day. He has tried diet changes for the symptoms. The treatment provided moderate relief.  Nicotine Dependence Presents for follow-up visit. Symptoms include irritability and sore throat. His urge triggers include company of smokers. He smokes 1 pack of cigarettes per day.  Sinusitis This is a recurrent problem. The current episode started 1 to 4 weeks ago. The problem has been waxing and waning since onset. There has been no fever. Associated symptoms include congestion, coughing, headaches, a hoarse voice, sinus pressure and a sore throat. Pertinent negatives include no ear pain or shortness of breath. Past treatments include acetaminophen.  Dysuria  This is a new problem. The current episode started in the past 7 days. The problem has been resolved. The quality of the pain is described as burning. The pain is mild. Associated symptoms include frequency. Pertinent negatives include no hematuria or urgency. He has tried increased fluids for the symptoms. The treatment provided moderate relief.      Review of Systems  Constitutional: Positive for irritability and malaise/fatigue.  HENT: Positive for congestion, hoarse voice,  sinus pressure and sore throat. Negative for ear pain.   Respiratory: Positive for  cough. Negative for shortness of breath.   Cardiovascular: Negative for palpitations.  Gastrointestinal: Positive for constipation.  Genitourinary: Positive for dysuria and frequency. Negative for hematuria and urgency.  Musculoskeletal: Positive for arthritis and stiffness.  Neurological: Positive for headaches.  Psychiatric/Behavioral: The patient is nervous/anxious.   All other systems reviewed and are negative.  Family History  Problem Relation Age of Onset  . Hypertension Mother   . Aneurysm Father   . Hypertension Father    Social History   Socioeconomic History  . Marital status: Married    Spouse name: Anne Ng  . Number of children: 3  . Years of education: 10  . Highest education level: 10th grade  Occupational History  . Occupation: Retired  Tobacco Use  . Smoking status: Current Every Day Smoker    Packs/day: 1.00    Years: 45.00    Pack years: 45.00    Types: Cigars  . Smokeless tobacco: Never Used  Vaping Use  . Vaping Use: Never used  Substance and Sexual Activity  . Alcohol use: No  . Drug use: No  . Sexual activity: Not Currently  Other Topics Concern  . Not on file  Social History Narrative  . Not on file   Social Determinants of Health   Financial Resource Strain: Not on file  Food Insecurity: Not on file  Transportation Needs: Not on file  Physical Activity: Not on file  Stress: Not on file  Social Connections: Not on file       Objective:   Physical Exam Vitals reviewed.  Constitutional:      General: He is not in acute distress.    Appearance: He is well-developed.  HENT:     Head: Normocephalic.     Right Ear: Tympanic membrane normal.     Left Ear: Tympanic membrane normal.     Nose:     Right Sinus: Maxillary sinus tenderness present.     Left Sinus: Maxillary sinus tenderness present.  Eyes:     General:        Right eye: No discharge.        Left eye: No discharge.     Pupils: Pupils are equal, round, and reactive to  light.  Neck:     Thyroid: No thyromegaly.  Cardiovascular:     Rate and Rhythm: Normal rate and regular rhythm.     Heart sounds: Normal heart sounds. No murmur heard.   Pulmonary:     Effort: Pulmonary effort is normal. No respiratory distress.     Breath sounds: Normal breath sounds. No wheezing.  Abdominal:     General: Bowel sounds are normal. There is no distension.     Palpations: Abdomen is soft.     Tenderness: There is no abdominal tenderness.  Musculoskeletal:        General: No tenderness. Normal range of motion.     Cervical back: Normal range of motion and neck supple.  Skin:    General: Skin is warm and dry.     Findings: No erythema or rash.  Neurological:     Mental Status: He is alert and oriented to person, place, and time.     Cranial Nerves: No cranial nerve deficit.     Deep Tendon Reflexes: Reflexes are normal and symmetric.  Psychiatric:        Behavior: Behavior normal.        Thought  Content: Thought content normal.        Judgment: Judgment normal.      BP (!) 149/90   Pulse 75   Temp (!) 97.3 F (36.3 C)   Ht 5' 10"  (1.778 m)   Wt 198 lb 12.8 oz (90.2 kg)   SpO2 98%   BMI 28.52 kg/m      Assessment & Plan:  SHAWNN BOUILLON comes in today with chief complaint of Hypertension (With exertion, sometimes forgets to take medicine for BP. Wants to do regular labs. ), taste change, sinus issues, and Atrial Fibrillation (Should he take BP pill?)   Diagnosis and orders addressed:  1. UTI symptoms - Urinalysis, Routine w reflex microscopic - CMP14+EGFR - CBC with Differential/Platelet - PSA, total and free  2. Essential hypertension, benign - CMP14+EGFR - CBC with Differential/Platelet  3. Gastroesophageal reflux disease, unspecified whether esophagitis present - CMP14+EGFR - CBC with Differential/Platelet  4. Arthritis - CMP14+EGFR - CBC with Differential/Platelet  5. Current smoker - CMP14+EGFR - CBC with  Differential/Platelet  6. GAD (generalized anxiety disorder)  - CMP14+EGFR - CBC with Differential/Platelet  7. Hyperlipidemia, unspecified hyperlipidemia type - CMP14+EGFR - CBC with Differential/Platelet - Lipid panel  8. Overweight (BMI 25.0-29.9) - CMP14+EGFR - CBC with Differential/Platelet  9. Vitamin D deficiency - CMP14+EGFR - CBC with Differential/Platelet - VITAMIN D 25 Hydroxy (Vit-D Deficiency, Fractures)  10. Annual physical exam - CMP14+EGFR - CBC with Differential/Platelet - Lipid panel - TSH - VITAMIN D 25 Hydroxy (Vit-D Deficiency, Fractures) - PSA, total and free  11. Dysuria  - CMP14+EGFR - CBC with Differential/Platelet - PSA, total and free  12. Acute sinusitis, recurrence not specified, unspecified location - Take meds as prescribed - Use a cool mist humidifier  -Use saline nose sprays frequently -Force fluids -For any cough or congestion  Use plain Mucinex- regular strength or max strength is fine -For fever or aces or pains- take tylenol or ibuprofen. -Throat lozenges if help - amoxicillin-clavulanate (AUGMENTIN) 875-125 MG tablet; Take 1 tablet by mouth 2 (two) times daily.  Dispense: 14 tablet; Refill: 0   Labs pending Health Maintenance reviewed Diet and exercise encouraged  Follow up plan: 6 months    Evelina Dun, FNP

## 2020-05-04 LAB — CBC WITH DIFFERENTIAL/PLATELET
Basophils Absolute: 0 10*3/uL (ref 0.0–0.2)
Basos: 1 %
EOS (ABSOLUTE): 0.1 10*3/uL (ref 0.0–0.4)
Eos: 1 %
Hematocrit: 46.6 % (ref 37.5–51.0)
Hemoglobin: 15.4 g/dL (ref 13.0–17.7)
Immature Grans (Abs): 0 10*3/uL (ref 0.0–0.1)
Immature Granulocytes: 0 %
Lymphocytes Absolute: 1.5 10*3/uL (ref 0.7–3.1)
Lymphs: 17 %
MCH: 28.4 pg (ref 26.6–33.0)
MCHC: 33 g/dL (ref 31.5–35.7)
MCV: 86 fL (ref 79–97)
Monocytes Absolute: 0.7 10*3/uL (ref 0.1–0.9)
Monocytes: 8 %
Neutrophils Absolute: 6.4 10*3/uL (ref 1.4–7.0)
Neutrophils: 73 %
Platelets: 264 10*3/uL (ref 150–450)
RBC: 5.42 x10E6/uL (ref 4.14–5.80)
RDW: 15.7 % — ABNORMAL HIGH (ref 11.6–15.4)
WBC: 8.6 10*3/uL (ref 3.4–10.8)

## 2020-05-04 LAB — CMP14+EGFR
ALT: 10 IU/L (ref 0–44)
AST: 12 IU/L (ref 0–40)
Albumin/Globulin Ratio: 1.6 (ref 1.2–2.2)
Albumin: 4.4 g/dL (ref 3.7–4.7)
Alkaline Phosphatase: 95 IU/L (ref 44–121)
BUN/Creatinine Ratio: 15 (ref 10–24)
BUN: 14 mg/dL (ref 8–27)
Bilirubin Total: 0.7 mg/dL (ref 0.0–1.2)
CO2: 20 mmol/L (ref 20–29)
Calcium: 9.2 mg/dL (ref 8.6–10.2)
Chloride: 103 mmol/L (ref 96–106)
Creatinine, Ser: 0.93 mg/dL (ref 0.76–1.27)
Globulin, Total: 2.8 g/dL (ref 1.5–4.5)
Glucose: 115 mg/dL — ABNORMAL HIGH (ref 65–99)
Potassium: 4.8 mmol/L (ref 3.5–5.2)
Sodium: 142 mmol/L (ref 134–144)
Total Protein: 7.2 g/dL (ref 6.0–8.5)
eGFR: 87 mL/min/{1.73_m2} (ref >59)

## 2020-05-04 LAB — LIPID PANEL
Chol/HDL Ratio: 3.6 ratio (ref 0.0–5.0)
Cholesterol, Total: 191 mg/dL (ref 100–199)
HDL: 53 mg/dL (ref >39)
LDL Chol Calc (NIH): 123 mg/dL — ABNORMAL HIGH (ref 0–99)
Triglycerides: 80 mg/dL (ref 0–149)
VLDL Cholesterol Cal: 15 mg/dL (ref 5–40)

## 2020-05-04 LAB — PSA, TOTAL AND FREE
PSA, Free Pct: 23.3 %
PSA, Free: 0.21 ng/mL
Prostate Specific Ag, Serum: 0.9 ng/mL (ref 0.0–4.0)

## 2020-05-04 LAB — VITAMIN D 25 HYDROXY (VIT D DEFICIENCY, FRACTURES): Vit D, 25-Hydroxy: 21.8 ng/mL — ABNORMAL LOW (ref 30.0–100.0)

## 2020-05-04 LAB — TSH: TSH: 2.09 u[IU]/mL (ref 0.450–4.500)

## 2020-05-07 ENCOUNTER — Telehealth: Payer: Self-pay

## 2020-05-07 MED ORDER — VITAMIN D (ERGOCALCIFEROL) 1.25 MG (50000 UNIT) PO CAPS
50000.0000 [IU] | ORAL_CAPSULE | ORAL | 0 refills | Status: DC
Start: 2020-05-07 — End: 2020-08-08

## 2020-05-07 NOTE — Telephone Encounter (Signed)
Please send Vitamin D Rx to [facility] in [place]. Pts wife said it was supposed to be sent per Christys lab result notes but has not been sent.

## 2020-05-07 NOTE — Telephone Encounter (Signed)
Wife aware vit d sent in

## 2020-05-21 ENCOUNTER — Encounter: Payer: Self-pay | Admitting: Family Medicine

## 2020-05-28 ENCOUNTER — Telehealth: Payer: Self-pay

## 2020-05-28 MED ORDER — DOXYCYCLINE HYCLATE 100 MG PO TABS
100.0000 mg | ORAL_TABLET | Freq: Two times a day (BID) | ORAL | 0 refills | Status: DC
Start: 2020-05-28 — End: 2020-08-08

## 2020-05-28 NOTE — Telephone Encounter (Signed)
Doxycycline Prescription sent to pharmacy   

## 2020-05-28 NOTE — Telephone Encounter (Signed)
Patient was seen for his annual physical on 05/03/20 and was given Augmentin for sinusitis.  He felt like he was getting better but symptoms have not completely gone away.  He is still having runny nose, ears feel stopped up, and his teeth are hurting.  He has not taken anything over the counter other than "cough medicine".  He would like to know if you will call in another antibiotic to [facility] in [place].  There are no Covid clinic appointments available today.

## 2020-05-28 NOTE — Telephone Encounter (Signed)
Patient aware.

## 2020-05-31 ENCOUNTER — Ambulatory Visit (INDEPENDENT_AMBULATORY_CARE_PROVIDER_SITE_OTHER): Payer: Medicare Other

## 2020-05-31 VITALS — Ht 70.0 in | Wt 198.0 lb

## 2020-05-31 DIAGNOSIS — Z0000 Encounter for general adult medical examination without abnormal findings: Secondary | ICD-10-CM

## 2020-05-31 DIAGNOSIS — J329 Chronic sinusitis, unspecified: Secondary | ICD-10-CM | POA: Insufficient documentation

## 2020-05-31 DIAGNOSIS — B351 Tinea unguium: Secondary | ICD-10-CM | POA: Insufficient documentation

## 2020-05-31 DIAGNOSIS — Z87891 Personal history of nicotine dependence: Secondary | ICD-10-CM | POA: Insufficient documentation

## 2020-05-31 DIAGNOSIS — R7989 Other specified abnormal findings of blood chemistry: Secondary | ICD-10-CM | POA: Insufficient documentation

## 2020-05-31 DIAGNOSIS — T7840XA Allergy, unspecified, initial encounter: Secondary | ICD-10-CM | POA: Insufficient documentation

## 2020-05-31 DIAGNOSIS — M5137 Other intervertebral disc degeneration, lumbosacral region: Secondary | ICD-10-CM | POA: Insufficient documentation

## 2020-05-31 DIAGNOSIS — J310 Chronic rhinitis: Secondary | ICD-10-CM | POA: Insufficient documentation

## 2020-05-31 DIAGNOSIS — M51379 Other intervertebral disc degeneration, lumbosacral region without mention of lumbar back pain or lower extremity pain: Secondary | ICD-10-CM | POA: Insufficient documentation

## 2020-05-31 NOTE — Progress Notes (Signed)
Subjective:   Jacob Sanders is a 75 y.o. male who presents for Medicare Annual/Subsequent preventive examination.  Virtual Visit via Telephone Note  I connected with  [name] on 05/31/20 at  1:15 PM EDT by telephone and verified that I am speaking with the correct person using two identifiers.  Location: Patient: Home Provider: [facility] Persons participating in the virtual visit: patient/Nurse Health Advisor   I discussed the limitations, risks, security and privacy concerns of performing an evaluation and management service by telephone and the availability of in person appointments. The patient expressed understanding and agreed to proceed.  Interactive audio and video telecommunications were attempted between this nurse and patient, however failed, due to patient having technical difficulties OR patient did not have access to video capability.  We continued and completed visit with audio only.  Some vital signs may be absent or patient reported.   Shaquala Broeker E Abbeygail Igoe, LPN   Review of Systems     Cardiac Risk Factors include: advanced age (>61men, >60 women);dyslipidemia;hypertension;male gender;sedentary lifestyle;smoking/ tobacco exposure     Objective:    Today's Vitals   05/31/20 1120  Weight: 198 lb (89.8 kg)  Height: 5\' 10"  (1.778 m)   Body mass index is 28.41 kg/m.  Advanced Directives 05/31/2020 07/29/2019 05/31/2019 05/21/2018 07/18/2016 07/17/2016 07/17/2016  Does Patient Have a Medical Advance Directive? No No Yes No No No No  Type of Advance Directive - - Living will - - - -  Does patient want to make changes to medical advance directive? - - No - Patient declined - - - -  Copy of Healthcare Power of Attorney in Chart? - - - - - - -  Would patient like information on creating a medical advance directive? No - Patient declined No - Patient declined - No - Patient declined No - Patient declined No - Patient declined No - Patient declined    Current Medications  (verified) Outpatient Encounter Medications as of 05/31/2020  Medication Sig  . carbamide peroxide (DEBROX) 6.5 % OTIC solution Place in ear(s).  06/02/2020 diltiazem (CARDIZEM LA) 240 MG 24 hr tablet TAKE 1 TABLET BY MOUTH  DAILY  . diltiazem (TIAZAC) 240 MG 24 hr capsule Take 1 capsule by mouth daily.  Marland Kitchen doxycycline (VIBRA-TABS) 100 MG tablet Take 1 tablet (100 mg total) by mouth 2 (two) times daily.  . meclizine (ANTIVERT) 25 MG tablet TAKE 1 TABLET BY MOUTH 3  TIMES DAILY AS NEEDED FOR  DIZZINESS (Patient taking differently: Take 25 mg by mouth 3 (three) times daily as needed for dizziness.)  . naproxen (NAPROSYN) 500 MG tablet Take 500 mg by mouth 2 (two) times daily with a meal.  . omeprazole (PRILOSEC) 20 MG capsule TAKE 1 CAPSULE BY MOUTH  DAILY AS NEEDED  . Vitamin D, Ergocalciferol, (DRISDOL) 1.25 MG (50000 UNIT) CAPS capsule Take 1 capsule (50,000 Units total) by mouth every 7 (seven) days.   No facility-administered encounter medications on file as of 05/31/2020.    Allergies (verified) Warfarin and related and Prednisone   History: Past Medical History:  Diagnosis Date  . Arthritis   . Dysrhythmia    afib  . GERD (gastroesophageal reflux disease)   . History of kidney stones    passed  . Hyperlipidemia   . Hypertension   . Nerve damage    left hand after accident- 1995  . Seasonal allergies   . Staph infection    post back surgeries 1998 and1999   Past  Surgical History:  Procedure Laterality Date  . BACK SURGERY  [phone]   discectomy  . HAND SURGERY Left 1995  . OPEN REDUCTION INTERNAL FIXATION (ORIF) DISTAL RADIAL FRACTURE Left 07/29/2019   Procedure: OPEN REDUCTION INTERNAL FIXATION (ORIF) LEFT DISTAL RADIAL FRACTURE;  Surgeon: Dominica SeverinGramig, William, MD;  Location: [facility] OR;  Service: Orthopedics;  Laterality: Left;  90 mins  . TONSILLECTOMY     childhood   . TOOTH EXTRACTION  3 weeks ago ;   had abcess beneath tooth; experiened some bleeding for 20 hours post op  . TOTAL  HIP ARTHROPLASTY Left 05/22/2016   Procedure: LEFT TOTAL HIP ARTHROPLASTY ANTERIOR APPROACH;  Surgeon: Samson FredericSwinteck, Brian, MD;  Location: [facility];  Service: Orthopedics;  Laterality: Left;  Dr. requesting RNFA  . TOTAL HIP ARTHROPLASTY Right 07/17/2016   Procedure: RIGHT TOTAL HIP ARTHROPLASTY ANTERIOR APPROACH;  Surgeon: Samson FredericSwinteck, Brian, MD;  Location: [facility];  Service: Orthopedics;  Laterality: Right;  needs RNFA   Family History  Problem Relation Age of Onset  . Hypertension Mother   . Aneurysm Father   . Hypertension Father    Social History   Socioeconomic History  . Marital status: Married    Spouse name: [name]  . Number of children: 3  . Years of education: 10  . Highest education level: 10th grade  Occupational History  . Occupation: Retired  Tobacco Use  . Smoking status: Current Every Day Smoker    Packs/day: 1.00    Years: 45.00    Pack years: 45.00    Types: Cigars  . Smokeless tobacco: Never Used  Vaping Use  . Vaping Use: Never used  Substance and Sexual Activity  . Alcohol use: No  . Drug use: No  . Sexual activity: Not Currently  Other Topics Concern  . Not on file  Social History Narrative  . Not on file   Social Determinants of Health   Financial Resource Strain: Low Risk   . Difficulty of Paying Living Expenses: Not hard at all  Food Insecurity: No Food Insecurity  . Worried About Programme researcher, broadcasting/film/videounning Out of Food in the Last Year: Never true  . Ran Out of Food in the Last Year: Never true  Transportation Needs: No Transportation Needs  . Lack of Transportation (Medical): No  . Lack of Transportation (Non-Medical): No  Physical Activity: Insufficiently Active  . Days of Exercise per Week: 7 days  . Minutes of Exercise per Session: 10 min  Stress: No Stress Concern Present  . Feeling of Stress : Not at all  Social Connections: Socially Integrated  . Frequency of Communication with Friends and Family: More than three times a week  . Frequency of Social Gatherings  with Friends and Family: Once a week  . Attends Religious Services: More than 4 times per year  . Active Member of Clubs or Organizations: Yes  . Attends BankerClub or Organization Meetings: More than 4 times per year  . Marital Status: Married    Tobacco Counseling Ready to quit: Not Answered Counseling given: Not Answered   Clinical Intake:  Pre-visit preparation completed: Yes  Pain : No/denies pain     BMI - recorded: 28.41 Nutritional Status: BMI 25 -29 Overweight Nutritional Risks: Nausea/ vomitting/ diarrhea (nausea from sinus infection) Diabetes: No  How often do you need to have someone help you when you read instructions, pamphlets, or other written materials from your doctor or pharmacy?: 1 - Never  Diabetic? No  Interpreter Needed?: No  Information entered by ::  Kenyon Eichelberger, LPN   Activities of Daily Living In your present state of health, do you have any difficulty performing the following activities: 05/31/2020 07/29/2019  Hearing? N N  Vision? N N  Difficulty concentrating or making decisions? N N  Walking or climbing stairs? N N  Dressing or bathing? N Y  Doing errands, shopping? N -  Preparing Food and eating ? N -  Using the Toilet? N -  In the past six months, have you accidently leaked urine? Y -  Comment usually in the middle of the night -  Do you have problems with loss of bowel control? N -  Managing your Medications? N -  Managing your Finances? N -  Housekeeping or managing your Housekeeping? N -  Some recent data might be hidden    Patient Care Team: Junie Spencer, FNP as PCP - General (Nurse Practitioner)  Indicate any recent Medical Services you may have received from other than [facility] providers in the past year (date may be approximate).     Assessment:   This is a routine wellness examination for [facility]  Hearing/Vision screen  Hearing Screening   125Hz  250Hz  500Hz  1000Hz  2000Hz  3000Hz  4000Hz  6000Hz  8000Hz   Right ear:            Left ear:           Comments: C/o moderate hearing loss - has wax buildup - Has appointment for have ears cleaned out and audiology exam 06/14/2020 - If necessary, will get hearing aids from  Vision Screening Comments: Doesn't have an eye doctor - if he has problems, [facility]  Dietary issues and exercise activities discussed: Current Exercise Habits: Home exercise routine, Type of exercise: walking, Time (Minutes): 10, Frequency (Times/Week): 7, Weekly Exercise (Minutes/Week): 70, Intensity: Mild, Exercise limited by: orthopedic condition(s)  Goals Addressed   None    Depression Screen PHQ 2/9 Scores 05/31/2020 05/03/2020 08/04/2019 05/31/2019 05/21/2018 05/06/2018 03/30/2018  PHQ - 2 Score 0 0 0 0 0 0 0    Fall Risk Fall Risk  05/31/2020 05/03/2020 08/04/2019 05/31/2019 05/21/2018  Falls in the past year? 1 1 1  0 0  Number falls in past yr: 0 0 0 - -  Comment walking backwards, tripped over a stumb, fractured left hand and wrist and had to have surgery - - - -  Injury with Fall? 1 1 0 - -  Risk for fall due to : History of fall(s);Orthopedic patient Other (Comment) No Fall Risks - -  Risk for fall due to: Comment - fell over a tree stump - - -  Follow up Education provided;Falls prevention discussed Falls evaluation completed Education provided - -    FALL RISK PREVENTION PERTAINING TO THE HOME:  Any stairs in or around the home? No  If so, are there any without handrails? No  Home free of loose throw rugs in walkways, pet beds, electrical cords, etc? Yes  Adequate lighting in your home to reduce risk of falls? Yes   ASSISTIVE DEVICES UTILIZED TO PREVENT FALLS:  Life alert? No  Use of a cane, walker or w/c? Yes  Grab bars in the bathroom? No  Shower chair or bench in shower? Yes  Elevated toilet seat or a handicapped toilet? Yes   TIMED UP AND GO:  Was the test performed? No . Telephonic visit.  Cognitive Function: Normal cognitive status assessed by direct observation  by this Nurse Health Advisor. No abnormalities found.  6CIT Screen 05/31/2019 05/21/2018  What Year? 0 points 0 points  What month? 0 points 0 points  What time? 0 points 0 points  Count back from 20 0 points 0 points  Months in reverse 0 points 0 points  Repeat phrase 2 points 0 points  Total Score 2 0    Immunizations Immunization History  Administered Date(s) Administered  . Tdap 05/09/2011    TDAP status: Up to date  Flu Vaccine status: Declined, Education has been provided regarding the importance of this vaccine but patient still declined. Advised may receive this vaccine at local pharmacy or Health Dept. Aware to provide a copy of the vaccination record if obtained from local pharmacy or Health Dept. Verbalized acceptance and understanding.  Pneumococcal vaccine status: Declined,  Education has been provided regarding the importance of this vaccine but patient still declined. Advised may receive this vaccine at local pharmacy or Health Dept. Aware to provide a copy of the vaccination record if obtained from local pharmacy or Health Dept. Verbalized acceptance and understanding.   Covid-19 vaccine status: Declined, Education has been provided regarding the importance of this vaccine but patient still declined. Advised may receive this vaccine at local pharmacy or Health Dept.or vaccine clinic. Aware to provide a copy of the vaccination record if obtained from local pharmacy or Health Dept. Verbalized acceptance and understanding.  Qualifies for Shingles Vaccine? Yes   Zostavax completed No   Shingrix Completed?: No.    Education has been provided regarding the importance of this vaccine. Patient has been advised to call insurance company to determine out of pocket expense if they have not yet received this vaccine. Advised may also receive vaccine at local pharmacy or Health Dept. Verbalized acceptance and understanding.  Screening Tests Health Maintenance  Topic Date Due  .  COVID-19 Vaccine (1) Never done  . COLON CANCER SCREENING ANNUAL FOBT  11/02/2020 (Originally 03/27/2017)  . COLONOSCOPY (Pts 45-20yrs Insurance coverage will need to be confirmed)  05/03/2021 (Originally 07/29/1992)  . PNA vac Low Risk Adult (1 of 2 - PCV13) 08/21/2028 (Originally 07/29/2012)  . INFLUENZA VACCINE  08/13/2020  . TETANUS/TDAP  05/08/2021  . Hepatitis C Screening  Completed  . HPV VACCINES  Aged Out    Health Maintenance  Health Maintenance Due  Topic Date Due  . COVID-19 Vaccine (1) Never done    Colorectal cancer screening: No longer required.  patient declines  Lung Cancer Screening: (Low Dose CT Chest recommended if Age 30-80 years, 30 pack-year currently smoking OR have quit w/in 15years.) does qualify.   Lung Cancer Screening Referral: declines  Additional Screening:  Hepatitis C Screening: does qualify; Completed 05/15/2014  Vision Screening: Recommended annual ophthalmology exams for early detection of glaucoma and other disorders of the eye. Is the patient up to date with their annual eye exam?  No  Who is the provider or what is the name of the office in which the patient attends annual [facility] exams[facility] If pt is not established with a provider, would they like to be referred to a provider to establish care? No .   Dental Screening: Recommended annual dental exams for proper oral hygiene  Community Resource Referral / Chronic Care Management: CRR required this visit?  No   CCM required this visit?  No      Plan:     I have personally reviewed and noted the following in the patient's chart:   . Medical and social history . Use of alcohol,  tobacco or illicit drugs  . Current medications and supplements including opioid prescriptions. Patient is not currently taking opioid prescriptions. . Functional ability and status . Nutritional status . Physical activity . Advanced directives . List of other physicians . Hospitalizations, surgeries, and ER  visits in previous 12 months . Vitals . Screenings to include cognitive, depression, and falls . Referrals and appointments  In addition, I have reviewed and discussed with patient certain preventive protocols, quality metrics, and best practice recommendations. A written personalized care plan for preventive services as well as general preventive health recommendations were provided to patient.     [place] Constable, LPN   1/61/0960   Nurse Notes: None

## 2020-05-31 NOTE — Patient Instructions (Addendum)
Mr. Jacob Sanders , Thank you for taking time to come for your Medicare Wellness Visit. I appreciate your ongoing commitment to your health goals. Please review the following plan we discussed and let me know if I can assist you in the future.   Screening recommendations/referrals: Colonoscopy: Declined Recommended yearly ophthalmology/optometry visit for glaucoma screening and checkup Recommended yearly dental visit for hygiene and checkup  Vaccinations: Influenza vaccine: Declined Pneumococcal vaccine: Done 2013, declines further vaccination Tdap vaccine: Done 05/09/2011 - Repeat 10 years Shingles vaccine: Declined   Covid-19: Declined  Advanced directives: Please bring a copy of your health care power of attorney and living will to the office to be added to your chart at your convenience.  Conditions/risks identified: Aim for 30 minutes of exercise or brisk walking each day, drink 6-8 glasses of water and eat lots of fruits and vegetables.  Next appointment: Follow up in one year for your annual wellness visit.   Preventive Care 30 Years and Older, Male  Preventive care refers to lifestyle choices and visits with your health care provider that can promote health and wellness. What does preventive care include?  A yearly physical exam. This is also called an annual well check.  Dental exams once or twice a year.  Routine eye exams. Ask your health care provider how often you should have your eyes checked.  Personal lifestyle choices, including:  Daily care of your teeth and gums.  Regular physical activity.  Eating a healthy diet.  Avoiding tobacco and drug use.  Limiting alcohol use.  Practicing safe sex.  Taking low doses of aspirin every day.  Taking vitamin and mineral supplements as recommended by your health care provider. What happens during an annual well check? The services and screenings done by your health care provider during your annual well check will depend  on your age, overall health, lifestyle risk factors, and family history of disease. Counseling  Your health care provider may ask you questions about your:  Alcohol use.  Tobacco use.  Drug use.  Emotional well-being.  Home and relationship well-being.  Sexual activity.  Eating habits.  History of falls.  Memory and ability to understand (cognition).  Work and work Astronomer. Screening  You may have the following tests or measurements:  Height, weight, and BMI.  Blood pressure.  Lipid and cholesterol levels. These may be checked every 5 years, or more frequently if you are over 61 years old.  Skin check.  Lung cancer screening. You may have this screening every year starting at age 28 if you have a 30-pack-year history of smoking and currently smoke or have quit within the past 15 years.  Fecal occult blood test (FOBT) of the stool. You may have this test every year starting at age 56.  Flexible sigmoidoscopy or colonoscopy. You may have a sigmoidoscopy every 5 years or a colonoscopy every 10 years starting at age 45.  Prostate cancer screening. Recommendations will vary depending on your family history and other risks.  Hepatitis C blood test.  Hepatitis B blood test.  Sexually transmitted disease (STD) testing.  Diabetes screening. This is done by checking your blood sugar (glucose) after you have not eaten for a while (fasting). You may have this done every 1-3 years.  Abdominal aortic aneurysm (AAA) screening. You may need this if you are a current or former smoker.  Osteoporosis. You may be screened starting at age 51 if you are at high risk. Talk with your health care provider about  your test results, treatment options, and if necessary, the need for more tests. Vaccines  Your health care provider may recommend certain vaccines, such as:  Influenza vaccine. This is recommended every year.  Tetanus, diphtheria, and acellular pertussis (Tdap, Td)  vaccine. You may need a Td booster every 10 years.  Zoster vaccine. You may need this after age 71.  Pneumococcal 13-valent conjugate (PCV13) vaccine. One dose is recommended after age 36.  Pneumococcal polysaccharide (PPSV23) vaccine. One dose is recommended after age 63. Talk to your health care provider about which screenings and vaccines you need and how often you need them. This information is not intended to replace advice given to you by your health care provider. Make sure you discuss any questions you have with your health care provider. Document Released: 01/26/2015 Document Revised: 09/19/2015 Document Reviewed: 10/31/2014 Elsevier Interactive Patient Education  2017 [employer] Prevention in the Home Falls can cause injuries. They can happen to people of all ages. There are many things you can do to make your home safe and to help prevent falls. What can I do on the outside of my home?  Regularly fix the edges of walkways and driveways and fix any cracks.  Remove anything that might make you trip as you walk through a door, such as a raised step or threshold.  Trim any bushes or trees on the path to your home.  Use bright outdoor lighting.  Clear any walking paths of anything that might make someone trip, such as rocks or tools.  Regularly check to see if handrails are loose or broken. Make sure that both sides of any steps have handrails.  Any raised decks and porches should have guardrails on the edges.  Have any leaves, snow, or ice cleared regularly.  Use sand or salt on walking paths during winter.  Clean up any spills in your garage right away. This includes oil or grease spills. What can I do in the bathroom?  Use night lights.  Install grab bars by the toilet and in the tub and shower. Do not use towel bars as grab bars.  Use non-skid mats or decals in the tub or shower.  If you need to sit down in the shower, use a plastic, non-slip  stool.  Keep the floor dry. Clean up any water that spills on the floor as soon as it happens.  Remove soap buildup in the tub or shower regularly.  Attach bath mats securely with double-sided non-slip rug tape.  Do not have throw rugs and other things on the floor that can make you trip. What can I do in the bedroom?  Use night lights.  Make sure that you have a light by your bed that is easy to reach.  Do not use any sheets or blankets that are too big for your bed. They should not hang down onto the floor.  Have a firm chair that has side arms. You can use this for support while you get dressed.  Do not have throw rugs and other things on the floor that can make you trip. What can I do in the kitchen?  Clean up any spills right away.  Avoid walking on wet floors.  Keep items that you use a lot in easy-to-reach places.  If you need to reach something above you, use a strong step stool that has a grab bar.  Keep electrical cords out of the way.  Do not use floor polish or wax  that makes floors slippery. If you must use wax, use non-skid floor wax.  Do not have throw rugs and other things on the floor that can make you trip. What can I do with my stairs?  Do not leave any items on the stairs.  Make sure that there are handrails on both sides of the stairs and use them. Fix handrails that are broken or loose. Make sure that handrails are as long as the stairways.  Check any carpeting to make sure that it is firmly attached to the stairs. Fix any carpet that is loose or worn.  Avoid having throw rugs at the top or bottom of the stairs. If you do have throw rugs, attach them to the floor with carpet tape.  Make sure that you have a light switch at the top of the stairs and the bottom of the stairs. If you do not have them, ask someone to add them for you. What else can I do to help prevent falls?  Wear shoes that:  Do not have high heels.  Have rubber bottoms.  Are  comfortable and fit you well.  Are closed at the toe. Do not wear sandals.  If you use a stepladder:  Make sure that it is fully opened. Do not climb a closed stepladder.  Make sure that both sides of the stepladder are locked into place.  Ask someone to hold it for you, if possible.  Clearly mark and make sure that you can see:  Any grab bars or handrails.  First and last steps.  Where the edge of each step is.  Use tools that help you move around (mobility aids) if they are needed. These include:  Canes.  Walkers.  Scooters.  Crutches.  Turn on the lights when you go into a dark area. Replace any light bulbs as soon as they burn out.  Set up your furniture so you have a clear path. Avoid moving your furniture around.  If any of your floors are uneven, fix them.  If there are any pets around you, be aware of where they are.  Review your medicines with your doctor. Some medicines can make you feel dizzy. This can increase your chance of falling. Ask your doctor what other things that you can do to help prevent falls. This information is not intended to replace advice given to you by your health care provider. Make sure you discuss any questions you have with your health care provider. Document Released: 10/26/2008 Document Revised: 06/07/2015 Document Reviewed: 02/03/2014 Elsevier Interactive Patient Education  2017 [employer].

## 2020-07-03 ENCOUNTER — Other Ambulatory Visit: Payer: Self-pay | Admitting: *Deleted

## 2020-07-03 DIAGNOSIS — I10 Essential (primary) hypertension: Secondary | ICD-10-CM

## 2020-07-03 DIAGNOSIS — I4891 Unspecified atrial fibrillation: Secondary | ICD-10-CM

## 2020-07-03 MED ORDER — DILTIAZEM HCL ER COATED BEADS 240 MG PO TB24: 240.0000 mg | ORAL_TABLET | Freq: Every day | ORAL | 1 refills | Status: DC

## 2020-07-03 MED ORDER — OMEPRAZOLE 20 MG PO CPDR: 1.0000 | DELAYED_RELEASE_CAPSULE | Freq: Every day | ORAL | 1 refills | Status: DC | PRN

## 2020-08-07 ENCOUNTER — Telehealth: Payer: Self-pay | Admitting: Family

## 2020-08-07 DIAGNOSIS — Z96643 Presence of artificial hip joint, bilateral: Secondary | ICD-10-CM | POA: Diagnosis not present

## 2020-08-07 DIAGNOSIS — Z20822 Contact with and (suspected) exposure to covid-19: Secondary | ICD-10-CM | POA: Insufficient documentation

## 2020-08-07 DIAGNOSIS — Z79899 Other long term (current) drug therapy: Secondary | ICD-10-CM | POA: Diagnosis not present

## 2020-08-07 DIAGNOSIS — R000 Tachycardia, unspecified: Secondary | ICD-10-CM | POA: Diagnosis not present

## 2020-08-07 DIAGNOSIS — I4891 Unspecified atrial fibrillation: Secondary | ICD-10-CM | POA: Diagnosis not present

## 2020-08-07 DIAGNOSIS — F17210 Nicotine dependence, cigarettes, uncomplicated: Secondary | ICD-10-CM | POA: Insufficient documentation

## 2020-08-07 DIAGNOSIS — I10 Essential (primary) hypertension: Secondary | ICD-10-CM | POA: Insufficient documentation

## 2020-08-07 DIAGNOSIS — R079 Chest pain, unspecified: Secondary | ICD-10-CM | POA: Diagnosis not present

## 2020-08-07 DIAGNOSIS — I491 Atrial premature depolarization: Secondary | ICD-10-CM | POA: Diagnosis not present

## 2020-08-07 DIAGNOSIS — R0789 Other chest pain: Secondary | ICD-10-CM | POA: Diagnosis not present

## 2020-08-08 ENCOUNTER — Encounter (HOSPITAL_COMMUNITY): Payer: Self-pay | Admitting: Emergency Medicine

## 2020-08-08 ENCOUNTER — Observation Stay (HOSPITAL_COMMUNITY)
Admission: EM | Admit: 2020-08-08 | Discharge: 2020-08-08 | Disposition: A | Discharge status: Home / Self Care | Payer: Medicare Other | Attending: Internal Medicine | Admitting: Internal Medicine

## 2020-08-08 ENCOUNTER — Observation Stay (HOSPITAL_BASED_OUTPATIENT_CLINIC_OR_DEPARTMENT_OTHER): Payer: Medicare Other

## 2020-08-08 ENCOUNTER — Other Ambulatory Visit: Payer: Self-pay

## 2020-08-08 ENCOUNTER — Emergency Department (HOSPITAL_COMMUNITY): Payer: Medicare Other

## 2020-08-08 DIAGNOSIS — R079 Chest pain, unspecified: Secondary | ICD-10-CM | POA: Diagnosis not present

## 2020-08-08 DIAGNOSIS — I4891 Unspecified atrial fibrillation: Secondary | ICD-10-CM

## 2020-08-08 HISTORY — DX: Unspecified atrial fibrillation: I48.91

## 2020-08-08 LAB — ECHOCARDIOGRAM COMPLETE
AR max vel: 2.23 cm2
AV Area VTI: 2.46 cm2
AV Area mean vel: 2.55 cm2
AV Mean grad: 3 mmHg
AV Peak grad: 7 mmHg
Ao pk vel: 1.32 m/s
Area-P 1/2: 3.93 cm2
Height: 70 in
MV VTI: 1.61 cm2
Weight: 3040 oz

## 2020-08-08 LAB — BASIC METABOLIC PANEL
Anion gap: 6 (ref 5–15)
BUN: 18 mg/dL (ref 8–23)
CO2: 24 mmol/L (ref 22–32)
Calcium: 8.3 mg/dL — ABNORMAL LOW (ref 8.9–10.3)
Chloride: 109 mmol/L (ref 98–111)
Creatinine, Ser: 0.96 mg/dL (ref 0.61–1.24)
GFR, Estimated: >60 mL/min (ref >60)
Glucose, Bld: 122 mg/dL — ABNORMAL HIGH (ref 70–99)
Potassium: 3.6 mmol/L (ref 3.5–5.1)
Sodium: 139 mmol/L (ref 135–145)

## 2020-08-08 LAB — CBC
HCT: 45.8 % (ref 39.0–52.0)
Hemoglobin: 14.7 g/dL (ref 13.0–17.0)
MCH: 30.3 pg (ref 26.0–34.0)
MCHC: 32.1 g/dL (ref 30.0–36.0)
MCV: 94.4 fL (ref 80.0–100.0)
Platelets: 227 10*3/uL (ref 150–400)
RBC: 4.85 MIL/uL (ref 4.22–5.81)
RDW: 17.1 % — ABNORMAL HIGH (ref 11.5–15.5)
WBC: 9 10*3/uL (ref 4.0–10.5)
nRBC: 0 % (ref 0.0–0.2)

## 2020-08-08 LAB — RESP PANEL BY RT-PCR (FLU A&B, COVID) ARPGX2
Influenza A by PCR: NEGATIVE
Influenza B by PCR: NEGATIVE
SARS Coronavirus 2 by RT PCR: NEGATIVE

## 2020-08-08 LAB — TROPONIN I (HIGH SENSITIVITY)
Troponin I (High Sensitivity): 9 ng/L (ref <18)
Troponin I (High Sensitivity): 13 ng/L (ref <18)

## 2020-08-08 IMAGING — DX DG CHEST 1V PORT
1 series · 1 of 1 positions shown · non-contrast
Comparison: Radiograph [DATE], CT [DATE]

CLINICAL DATA: Chest pain

EXAM:
PORTABLE CHEST 1 VIEW

[chest pa]
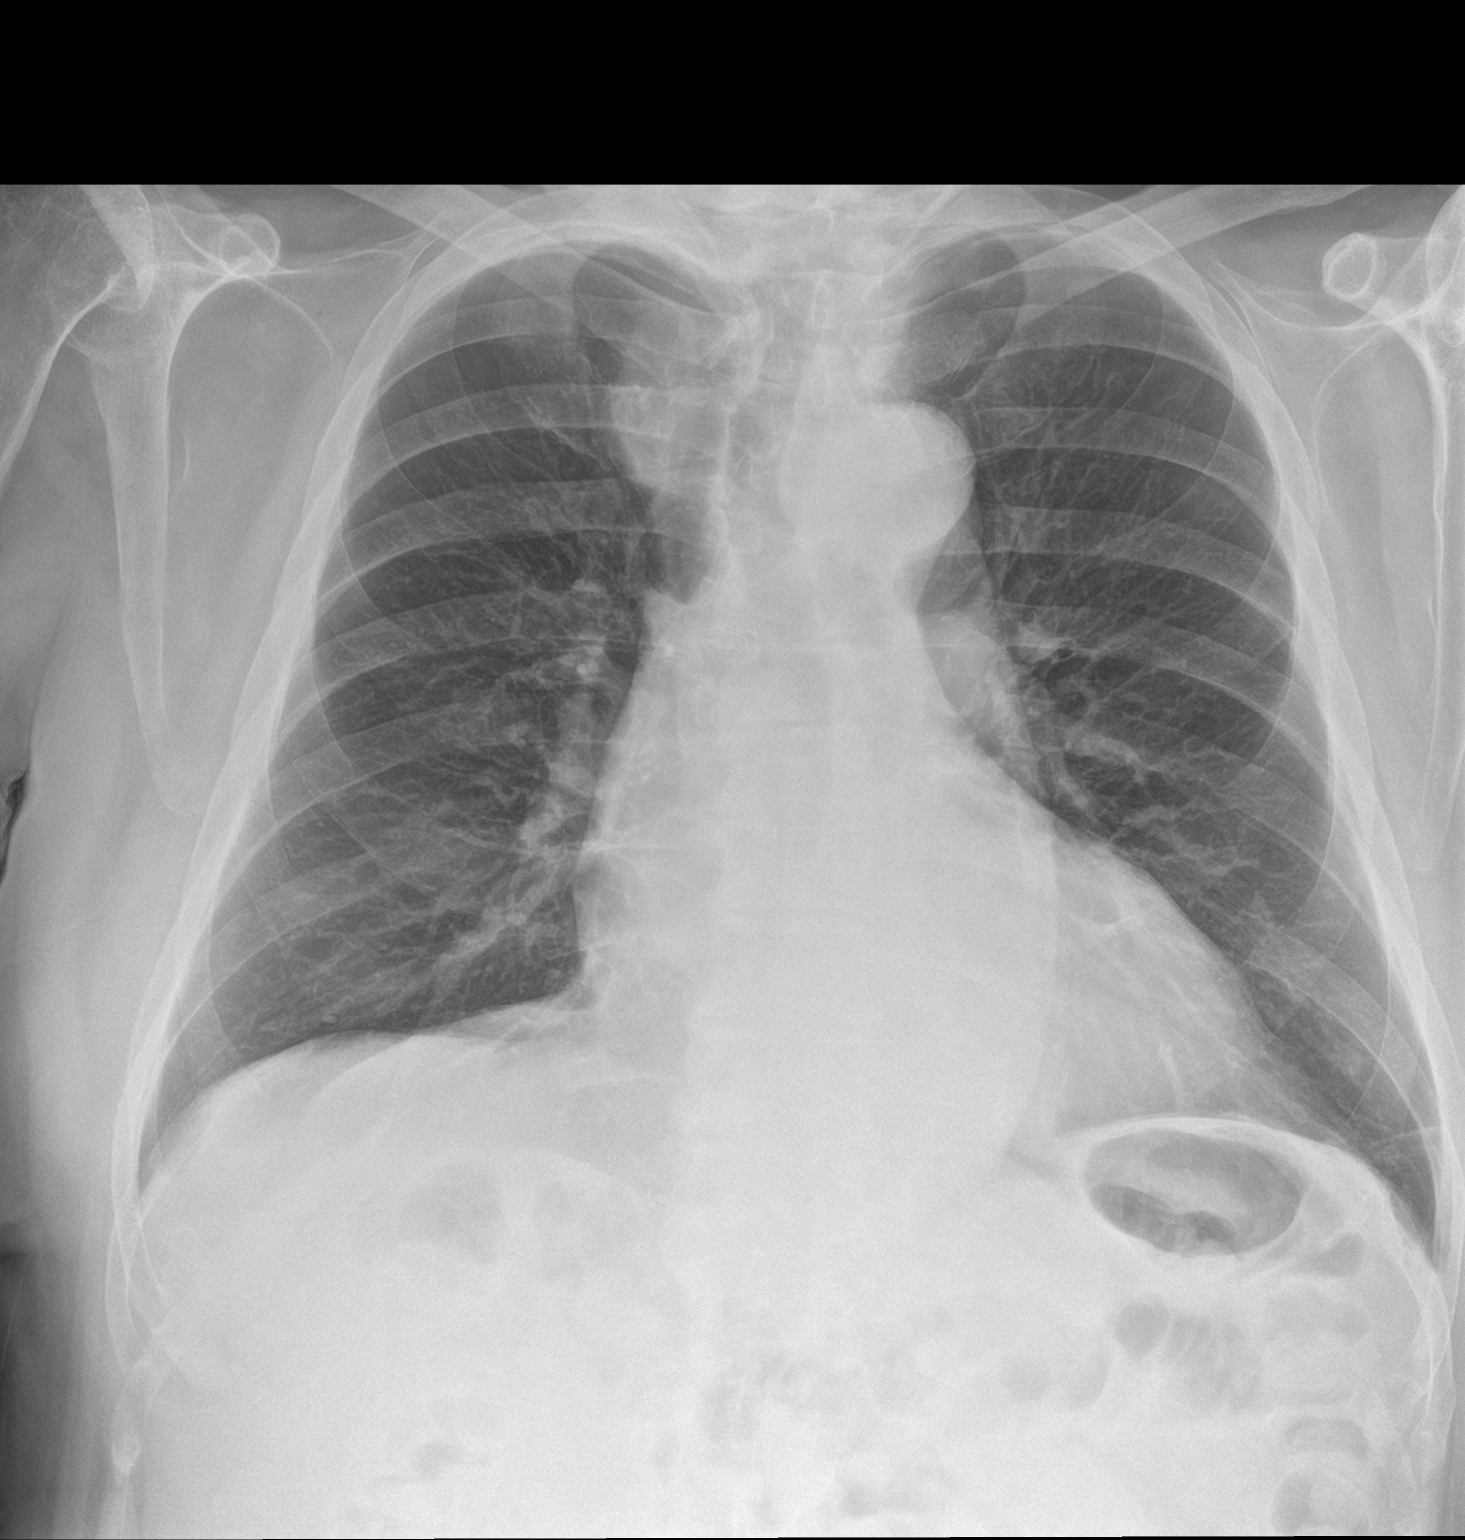

[1 of 1 positions shown; findings below may reference images not displayed]

FINDINGS: Few streaky opacities in the left lung base, likely subsegmental
atelectasis or scarring. No consolidation, features of edema,
pneumothorax, or effusion. Tortuous brachiocephalic vasculature.
Coronary artery calcifications. Cardiac size within normal limits.
The aorta is calcified. The remaining cardiomediastinal contours are
unremarkable. No acute osseous or soft tissue abnormality.
Degenerative changes are present in the imaged spine and shoulders.
IMPRESSION: Streaky opacities in the left lung base, likely atelectasis or
scarring.

No other acute cardiopulmonary abnormality.

Coronary and aortic atherosclerosis.

## 2020-08-08 MED ORDER — ONDANSETRON HCL 4 MG/2ML IJ SOLN: 4.0000 mg | Freq: Four times a day (QID) | INTRAMUSCULAR | Status: DC | PRN

## 2020-08-08 MED ORDER — ACETAMINOPHEN 325 MG PO TABS
650.0000 mg | ORAL_TABLET | ORAL | Status: DC | PRN
Start: 2020-08-08 — End: 2020-08-08

## 2020-08-08 MED ORDER — ENOXAPARIN SODIUM 40 MG/0.4ML IJ SOSY
40.0000 mg | PREFILLED_SYRINGE | INTRAMUSCULAR | Status: DC
  Administered 2020-08-08: 40 mg via SUBCUTANEOUS
  Filled 2020-08-08 (×2): qty 0.4

## 2020-08-08 NOTE — ED Triage Notes (Signed)
Pt states he woke up yesterday morning with pain in his chest. Pt thought he may have pulled a muscle while working on his pool. Pain resolved but came back around 9pm last night. Pt states EMS was called and they did an EKG but they told him that it looked fine but that his BP was high and they recommended that he come to ED to have his BP checked. Pt states he took 4 baby ASA prior to EMS arrival. Pt currently denies pain in chest, just states it is "sore".

## 2020-08-08 NOTE — ED Notes (Signed)
Tech at bedside to perform stat ECHO; pt denies sx. Pt adamant about going home

## 2020-08-08 NOTE — Discharge Summary (Signed)
Physician Discharge Summary  Jacob Dakinshurman E Hilyard ZOX:[id number] DOB: 1946-01-05 DOA: 08/08/2020  PCP: Junie SpencerHawks, Christy A, FNP  Admit date: 08/08/2020 Discharge date: 08/08/2020  Admitted From: Home  Discharge disposition: Home   Recommendations for Outpatient Follow-Up:   Follow up with your primary care provider in one week.  Check CBC, BMP, magnesium in the next visit  Discharge Diagnosis:   Active Problems:   Chest pain    Discharge Condition: Improved.  Diet recommendation: Low sodium, heart healthy.   Wound care: None.  Code status: Full.   Hospital Course:   Following conditions were addressed during hospitalization as listed below,  Please refer to history and physical dated 08/08/2020 by me.  Patient was admitted in the morning and was discharged by the afternoon.  Briefly patient was admitted for chest pain which was atypical.  Troponins were negative EKG unremarkable 2D echocardiogram showed no wall motion abnormality.  Patient has ruled out for acute coronary syndrome.  At this time patient has been considered stable for disposition home.   Disposition.  At this time, patient is stable for disposition home with outpatient PCP follow-up  Medical Consultants:   None.  Procedures:    2D echocardiogram Subjective:   Today, patient feels well.  No further chest pain  Discharge Exam:   Vitals:   08/08/20 1315 08/08/20 1346  BP:  122/89  Pulse: 73   Resp: 19   Temp:    SpO2: 98%    Vitals:   08/08/20 1145 08/08/20 1200 08/08/20 1315 08/08/20 1346  BP: (!) 144/95 133/74  122/89  Pulse: (!) 38 (!) 139 73   Resp: 14 19 19    Temp:      TempSrc:      SpO2: 97% 96% 98%   Weight:      Height:        General: Alert awake, not in obvious distress HENT: pupils equally reacting to light,  No scleral pallor or icterus noted. Oral mucosa is moist.  Chest:  Clear breath sounds.  Diminished breath sounds bilaterally. No crackles or wheezes.  CVS: S1 &S2  heard. No murmur.  Regular rate and rhythm. Abdomen: Soft, nontender, nondistended.  Bowel sounds are heard.   Extremities: No cyanosis, clubbing or edema.  Peripheral pulses are palpable. Psych: Alert, awake and oriented, normal mood CNS:  No cranial nerve deficits.  Power equal in all extremities.   Skin: Warm and dry.  No rashes noted.  The results of significant diagnostics from this hospitalization (including imaging, microbiology, ancillary and laboratory) are listed below for reference.     Diagnostic Studies:   DG Chest Portable 1 View  Result Date: 08/08/2020 CLINICAL DATA:  Chest pain EXAM: PORTABLE CHEST 1 VIEW COMPARISON:  Radiograph 08/04/2019, CT 05/22/2016 FINDINGS: Few streaky opacities in the left lung base, likely subsegmental atelectasis or scarring. No consolidation, features of edema, pneumothorax, or effusion. Tortuous brachiocephalic vasculature. Coronary artery calcifications. Cardiac size within normal limits. The aorta is calcified. The remaining cardiomediastinal contours are unremarkable. No acute osseous or soft tissue abnormality. Degenerative changes are present in the imaged spine and shoulders. IMPRESSION: Streaky opacities in the left lung base, likely atelectasis or scarring. No other acute cardiopulmonary abnormality. Coronary and aortic atherosclerosis. Electronically Signed   By: Kreg ShropshirePrice  DeHay M.D.   On: 08/08/2020 01:57   ECHOCARDIOGRAM COMPLETE  Result Date: 08/08/2020    ECHOCARDIOGRAM REPORT   Patient Name:   Jacob Sanders Date of Exam: 08/08/2020 Medical Rec #:  [id number]       Height:       70.0 in Accession #:    [id number]      Weight:       190.0 lb Date of Birth:  [birth year]       BSA:          2.042 m Patient Age:    75 years        BP:           128/83 mmHg Patient Gender: M               HR:           58 bpm. [facility] Procedure: 2D Echo, Cardiac Doppler and Color Doppler Indications:    Chest Pain  History:        Patient has  prior history of Echocardiogram examinations, most                 recent 01/24/2016. Arrythmias:Tachycardia and Atrial                 Fibrillation, Signs/Symptoms:Chest Pain; Risk                 Factors:Hypertension, Dyslipidemia and Current Smoker.  Sonographer:    Mikki Harbor Referring Phys: [id number] [facility] Halaina Vanduzer IMPRESSIONS  1. Left ventricular ejection fraction, by estimation, is 60 to 65%. The left ventricle has normal function. The left ventricle has no regional wall motion abnormalities. There is mild left ventricular hypertrophy. Left ventricular diastolic parameters are indeterminate.  2. Right ventricular systolic function is normal. The right ventricular size is normal. There is mildly elevated pulmonary artery systolic pressure.  3. Left atrial size was severely dilated.  4. Right atrial size was severely dilated.  5. The mitral valve is normal in structure. Mild mitral valve regurgitation. No evidence of mitral stenosis.  6. The aortic valve is tricuspid. Aortic valve regurgitation is not visualized. No aortic stenosis is present.  7. There is mild dilatation of the ascending aorta, measuring 37 mm. FINDINGS  Left Ventricle: Left ventricular ejection fraction, by estimation, is 60 to 65%. The left ventricle has normal function. The left ventricle has no regional wall motion abnormalities. The left ventricular internal cavity size was normal in size. There is  mild left ventricular hypertrophy. Left ventricular diastolic parameters are indeterminate. Right Ventricle: The right ventricular size is normal. No increase in right ventricular wall thickness. Right ventricular systolic function is normal. There is mildly elevated pulmonary artery systolic pressure. The tricuspid regurgitant velocity is 2.67  m/s, and with an assumed right atrial pressure of 8 mmHg, the estimated right ventricular systolic pressure is 36.5 mmHg. Left Atrium: Left atrial size was severely dilated. Right Atrium: Right  atrial size was severely dilated. Pericardium: There is no evidence of pericardial effusion. Mitral Valve: The mitral valve is normal in structure. Mild mitral valve regurgitation. No evidence of mitral valve stenosis. MV peak gradient, 3.5 mmHg. The mean mitral valve gradient is 1.0 mmHg. Tricuspid Valve: The tricuspid valve is normal in structure. Tricuspid valve regurgitation is mild . No evidence of tricuspid stenosis. Aortic Valve: The aortic valve is tricuspid. Aortic valve regurgitation is not visualized. No aortic stenosis is present. Aortic valve mean gradient measures 3.0 mmHg. Aortic valve peak gradient measures 7.0 mmHg. Aortic valve area, by VTI measures 2.46 cm. Pulmonic Valve: The pulmonic valve was not well visualized. Pulmonic valve regurgitation is trivial. No evidence of pulmonic stenosis. Aorta: The  aortic root is normal in size and structure. There is mild dilatation of the ascending aorta, measuring 37 mm. IAS/Shunts: The interatrial septum was not well visualized.  LEFT VENTRICLE PLAX 2D LVIDd:         4.56 cm  Diastology LV PW:         1.27 cm  LV e' medial:    11.00 cm/s LV IVS:        1.28 cm  LV E/e' medial:  7.7 LVOT diam:     2.00 cm  LV e' lateral:   17.30 cm/s LV SV:         59       LV E/e' lateral: 4.9 LV SV Index:   29 LVOT Area:     3.14 cm  RIGHT VENTRICLE RV Basal diam:  4.47 cm RV Mid diam:    4.28 cm RV S prime:     15.10 cm/s TAPSE (M-mode): 2.5 cm LEFT ATRIUM              Index       RIGHT ATRIUM           Index LA diam:        4.80 cm  2.35 cm/m  RA Area:     26.20 cm LA Vol (A2C):   107.0 ml 52.39 ml/m RA Volume:   73.90 ml  36.18 ml/m LA Vol (A4C):   124.0 ml 60.72 ml/m LA Biplane Vol: 124.0 ml 60.72 ml/m  AORTIC VALVE AV Area (Vmax):    2.23 cm AV Area (Vmean):   2.55 cm AV Area (VTI):     2.46 cm AV Vmax:           132.00 cm/s AV Vmean:          74.450 cm/s AV VTI:            0.238 m AV Peak Grad:      7.0 mmHg AV Mean Grad:      3.0 mmHg LVOT Vmax:          93.87 cm/s LVOT Vmean:        60.367 cm/s LVOT VTI:          0.187 m LVOT/AV VTI ratio: 0.78  AORTA Ao Root diam: 3.20 cm Ao Asc diam:  3.70 cm MITRAL VALVE               TRICUSPID VALVE MV Area (PHT): 3.93 cm    TR Peak grad:   28.5 mmHg MV Area VTI:   1.61 cm    TR Vmax:        267.00 cm/s MV Peak grad:  3.5 mmHg MV Mean grad:  1.0 mmHg    SHUNTS MV Vmax:       0.94 m/s    Systemic VTI:  0.19 m MV Vmean:      34.2 cm/s   Systemic Diam: 2.00 cm MV Decel Time: 193 msec MV E velocity: 84.80 cm/s Dina Rich MD Electronically signed by Dina Rich MD Signature Date/Time: 08/08/2020/12:37:31 PM    Final      Labs:   Basic Metabolic Panel: Recent Labs  Lab 08/08/20 0042  NA 139  K 3.6  CL 109  CO2 24  GLUCOSE 122*  BUN 18  CREATININE 0.96  CALCIUM 8.3*   GFR Estimated Creatinine Clearance: 70.8 mL/min (by C-G formula based on SCr of 0.96 mg/dL). Liver Function Tests: No results for input(s): AST, ALT, ALKPHOS, BILITOT, PROT, ALBUMIN  in the last 168 hours. No results for input(s): LIPASE, AMYLASE in the last 168 hours. No results for input(s): AMMONIA in the last 168 hours. Coagulation profile No results for input(s): INR, PROTIME in the last 168 hours.  CBC: Recent Labs  Lab 08/08/20 0042  WBC 9.0  HGB 14.7  HCT 45.8  MCV 94.4  PLT 227   Cardiac Enzymes: No results for input(s): CKTOTAL, CKMB, CKMBINDEX, TROPONINI in the last 168 hours. BNP: Invalid input(s): POCBNP CBG: No results for input(s): GLUCAP in the last 168 hours. D-Dimer No results for input(s): DDIMER in the last 72 hours. Hgb A1c No results for input(s): HGBA1C in the last 72 hours. Lipid Profile No results for input(s): CHOL, HDL, LDLCALC, TRIG, CHOLHDL, LDLDIRECT in the last 72 hours. Thyroid function studies No results for input(s): TSH, T4TOTAL, T3FREE, THYROIDAB in the last 72 hours.  Invalid input(s): FREET3 Anemia work up No results for input(s): VITAMINB12, FOLATE, FERRITIN, TIBC,  IRON, RETICCTPCT in the last 72 hours. Microbiology Recent Results (from the past 240 hour(s))  Resp Panel by RT-PCR (Flu A&B, Covid) Nasopharyngeal Swab     Status: None   Collection Time: 08/08/20  3:36 AM   Specimen: Nasopharyngeal Swab; Nasopharyngeal(NP) swabs in vial transport medium  Result Value Ref Range Status   SARS Coronavirus 2 by RT PCR NEGATIVE NEGATIVE Final    Comment: (NOTE) SARS-CoV-2 target nucleic acids are NOT DETECTED.  The SARS-CoV-2 RNA is generally detectable in upper respiratory specimens during the acute phase of infection. The lowest concentration of SARS-CoV-2 viral copies this assay can detect is 138 copies/mL. A negative result does not preclude SARS-Cov-2 infection and should not be used as the sole basis for treatment or other patient management decisions. A negative result may occur with  improper specimen collection/handling, submission of specimen other than nasopharyngeal swab, presence of viral mutation(s) within the areas targeted by this assay, and inadequate number of viral copies(<138 copies/mL). A negative result must be combined with clinical observations, patient history, and epidemiological information. The expected result is Negative.  Fact Sheet for Patients:  BloggerCourse.com  Fact Sheet for Healthcare Providers:  SeriousBroker.it  This test is no t yet approved or cleared by the [place] FDA and  has been authorized for detection and/or diagnosis of SARS-CoV-2 by FDA under an Emergency Use Authorization (EUA). This EUA will remain  in effect (meaning this test can be used) for the duration of the COVID-19 declaration under Section 564(b)(1) of the Act, 21 U.S.C.section 360bbb-3(b)(1), unless the authorization is terminated  or revoked sooner.       Influenza A by PCR NEGATIVE NEGATIVE Final   Influenza B by PCR NEGATIVE NEGATIVE Final    Comment: (NOTE) The Xpert Xpress  SARS-CoV-2/FLU/RSV plus assay is intended as an aid in the diagnosis of influenza from Nasopharyngeal swab specimens and should not be used as a sole basis for treatment. Nasal washings and aspirates are unacceptable for Xpert Xpress SARS-CoV-2/FLU/RSV testing.  Fact Sheet for Patients: BloggerCourse.com  Fact Sheet for Healthcare Providers: SeriousBroker.it  This test is not yet approved or cleared by the [place] FDA and has been authorized for detection and/or diagnosis of SARS-CoV-2 by FDA under an Emergency Use Authorization (EUA). This EUA will remain in effect (meaning this test can be used) for the duration of the COVID-19 declaration under Section 564(b)(1) of the Act, 21 U.S.C. section 360bbb-3(b)(1), unless the authorization is terminated or revoked.  Performed at [facility], [address], [place], [place] [address]      Discharge Instructions:   Discharge Instructions     Diet - low sodium heart healthy   Complete by: As directed    Discharge instructions   Complete by: As directed    Follow-up with your primary care physician in 1 week.   Increase activity slowly   Complete by: As directed       Allergies as of 08/08/2020       Reactions   Warfarin And Related Other (See Comments)   Bleeding.   Prednisone Swelling        Medication List     STOP taking these medications    doxycycline 100 MG tablet Commonly known as: VIBRA-TABS   Vitamin D (Ergocalciferol) 1.25 MG (50000 UNIT) Caps capsule Commonly known as: DRISDOL       TAKE these medications    diltiazem 240 MG 24 hr tablet Commonly known as: CARDIZEM LA Take 1 tablet (240 mg total) by mouth daily.   meclizine 25 MG tablet Commonly known as: ANTIVERT TAKE 1 TABLET BY MOUTH 3  TIMES DAILY AS NEEDED FOR  DIZZINESS What changed: See the new instructions.   omeprazole 20 MG capsule Commonly known as: PRILOSEC Take 1 capsule  (20 mg total) by mouth daily as needed. What changed: reasons to take this        Follow-up Information     Junie Spencer, FNP Follow up.   Specialty: Family Medicine Contact information: [address] [place] [address] [phone]                  Time coordinating discharge: 39 minutes  Signed:  Lakeyia Surber  [facility] 08/08/2020, 2:20 PM

## 2020-08-08 NOTE — H&P (Signed)
[facility] History and Physical  Jacob Sanders OQH:[id number] DOB: 01-24-1945 DOA: 08/08/2020  Referring physician: ED  PCP: Junie Spencer, FNP   Patient is coming from: Home  Chief Complaint: Chest discomfort  HPI: Jacob Sanders is a 75 y.o. male with past medical history of GERD, hyperlipidemia, smoking presented to hospital with complaints of chest discomfort.  He stated that that he felt like there was a pull in his muscle.  Denied any shortness of breath dizziness cough fever chills or rigor.  He does have history of GERD and had some feeling of indigestion and belching and takes omeprazole as outpatient.  Denies any radiation of the pain.  Denies any nausea vomiting or diarrhea.  Denies any fever, cough, urinary urgency, frequency or dysuria.  Denies any changes in his bowel habits.  Denies any recent travel or sick contacts.  ED Course: In the ED, patient was hemodynamically stable.  He did not have any further chest pain.  COVID test was negative.  Labs were essentially within normal limits.  Troponins were negative.  EKG was unremarkable.  Was initially considered for observation in the hospital  Review of Systems:  All systems were reviewed and were negative unless otherwise mentioned in the HPI  Past Medical History:  Diagnosis Date   Arthritis    Atrial fibrillation (HCC)    Dysrhythmia    afib   GERD (gastroesophageal reflux disease)    History of kidney stones    passed   Hyperlipidemia    Hypertension    Nerve damage    left hand after accident- 1995   Seasonal allergies    Staph infection    post back surgeries 1998 and1999   Past Surgical History:  Procedure Laterality Date   BACK SURGERY  5465,6812   discectomy   HAND SURGERY Left 1995   OPEN REDUCTION INTERNAL FIXATION (ORIF) DISTAL RADIAL FRACTURE Left 07/29/2019   Procedure: OPEN REDUCTION INTERNAL FIXATION (ORIF) LEFT DISTAL RADIAL FRACTURE;  Surgeon: Dominica Severin, MD;  Location: [facility] OR;   Service: Orthopedics;  Laterality: Left;  90 mins   TONSILLECTOMY     childhood    TOOTH EXTRACTION  3 weeks ago ;   had abcess beneath tooth; experiened some bleeding for 20 hours post op   TOTAL HIP ARTHROPLASTY Left 05/22/2016   Procedure: LEFT TOTAL HIP ARTHROPLASTY ANTERIOR APPROACH;  Surgeon: Samson Frederic, MD;  Location: [facility]S;  Service: Orthopedics;  Laterality: Left;  Dr. requesting RNFA   TOTAL HIP ARTHROPLASTY Right 07/17/2016   Procedure: RIGHT TOTAL HIP ARTHROPLASTY ANTERIOR APPROACH;  Surgeon: Samson Frederic, MD;  Location: [facility] OR[facility];  Service: Orthopedics;  Laterality: Right;  needs RNFA    Social History:  reports that he has been smoking cigars and cigarettes. He has a 45.00 pack-year smoking history. He has never used smokeless tobacco. He reports that he does not drink alcohol and does not use drugs.  Allergies  Allergen Reactions   Warfarin And Related Other (See Comments)    Bleeding.   Prednisone Swelling    Family History  Problem Relation Age of Onset   Hypertension Mother    Aneurysm Father    Hypertension Father      Prior to Admission medications   Medication Sig Start Date End Date Taking? Authorizing Provider  diltiazem (CARDIZEM LA) 240 MG 24 hr tablet Take 1 tablet (240 mg total) by mouth daily. 07/03/20  Yes Hawks, Christy A, FNP  meclizine (ANTIVERT) 25 MG tablet TAKE  1 TABLET BY MOUTH 3  TIMES DAILY AS NEEDED FOR  DIZZINESS Patient taking differently: Take 25 mg by mouth 3 (three) times daily as needed for dizziness. 05/30/19  Yes Hawks, Christy A, FNP  omeprazole (PRILOSEC) 20 MG capsule Take 1 capsule (20 mg total) by mouth daily as needed. Patient taking differently: Take 1 capsule by mouth daily as needed (reflux). 07/03/20  Yes Hawks, Christy A, FNP  doxycycline (VIBRA-TABS) 100 MG tablet Take 1 tablet (100 mg total) by mouth 2 (two) times daily. Patient not taking: No sig reported 05/28/20   Jannifer Rodney A, FNP  Vitamin D, Ergocalciferol,  (DRISDOL) 1.25 MG (50000 UNIT) CAPS capsule Take 1 capsule (50,000 Units total) by mouth every 7 (seven) days. Patient not taking: No sig reported 05/07/20   Junie Spencer, FNP    Physical Exam: Vitals:   08/08/20 0430 08/08/20 0445 08/08/20 0500 08/08/20 0600  BP: 130/82  121/88 128/83  Pulse: (!) 137 68 (!) 45 (!) 58  Resp: 18 17 19 20   Temp:      TempSrc:      SpO2: 98% 96% 97% 97%  Weight:      Height:       Wt Readings from Last 3 Encounters:  08/08/20 86.2 kg  05/31/20 89.8 kg  05/03/20 90.2 kg   Body mass index is 27.26 kg/m.  General:  Average built, not in obvious distress HENT: Normocephalic, pupils equally reacting to light and accommodation.  No scleral pallor or icterus noted. Oral mucosa is moist.  Chest:  Clear breath sounds.  Diminished breath sounds bilaterally. No crackles or wheezes.  CVS: S1 &S2 heard. No murmur.  Regular rate and rhythm. Abdomen: Soft, nontender, nondistended.  Bowel sounds are heard.  Liver is not palpable, no abdominal mass palpated Extremities: No cyanosis, clubbing or edema.  Peripheral pulses are palpable. Psych: Alert, awake and oriented, normal mood CNS:  No cranial nerve deficits.  Power equal in all extremities.   No cerebellar signs.   Skin: Warm and dry.  No rashes noted.  Labs on Admission:   CBC: Recent Labs  Lab 08/08/20 0042  WBC 9.0  HGB 14.7  HCT 45.8  MCV 94.4  PLT 227    Basic Metabolic Panel: Recent Labs  Lab 08/08/20 0042  NA 139  K 3.6  CL 109  CO2 24  GLUCOSE 122*  BUN 18  CREATININE 0.96  CALCIUM 8.3*    Liver Function Tests: No results for input(s): AST, ALT, ALKPHOS, BILITOT, PROT, ALBUMIN in the last 168 hours. No results for input(s): LIPASE, AMYLASE in the last 168 hours. No results for input(s): AMMONIA in the last 168 hours.  Cardiac Enzymes: No results for input(s): CKTOTAL, CKMB, CKMBINDEX, TROPONINI in the last 168 hours.  BNP (last 3 results) No results for input(s): BNP in  the last 8760 hours.  ProBNP (last 3 results) No results for input(s): PROBNP in the last 8760 hours.  CBG: No results for input(s): GLUCAP in the last 168 hours.  Lipase  No results found for: LIPASE   Urinalysis    Component Value Date/Time   APPEARANCEUR Clear 05/03/2020 0904   GLUCOSEU Negative 05/03/2020 0904   BILIRUBINUR Negative 05/03/2020 0904   PROTEINUR Negative 05/03/2020 0904   UROBILINOGEN negative 05/31/2014 1521   NITRITE Negative 05/03/2020 0904   LEUKOCYTESUR Negative 05/03/2020 0904     Drugs of Abuse  No results found for: LABOPIA, COCAINSCRNUR, LABBENZ, AMPHETMU, THCU, LABBARB    Radiological  Exams on Admission: DG Chest Portable 1 View  Result Date: 08/08/2020 CLINICAL DATA:  Chest pain EXAM: PORTABLE CHEST 1 VIEW COMPARISON:  Radiograph 08/04/2019, CT 05/22/2016 FINDINGS: Few streaky opacities in the left lung base, likely subsegmental atelectasis or scarring. No consolidation, features of edema, pneumothorax, or effusion. Tortuous brachiocephalic vasculature. Coronary artery calcifications. Cardiac size within normal limits. The aorta is calcified. The remaining cardiomediastinal contours are unremarkable. No acute osseous or soft tissue abnormality. Degenerative changes are present in the imaged spine and shoulders. IMPRESSION: Streaky opacities in the left lung base, likely atelectasis or scarring. No other acute cardiopulmonary abnormality. Coronary and aortic atherosclerosis. Electronically Signed   By: Kreg Shropshire M.D.   On: 08/08/2020 01:57    EKG: Personally reviewed by me which shows normal sinus rhythm  Assessment/Plan Active Problems:   Chest pain   Chest pain.  Atypical.  Had muscular twitches.  EKG unremarkable.  Troponins negative.  Check 2D echocardiogram.  History of atrial fibrillation.  Had massive hematuria and was taken off anticoagulation in the past.  Has not been on anticoagulation for now.  Controlled at this time.  Continue  Cardizem from home.  GERD.  Continue home medication regimen of omeprazole.   Hyperlipidemia.  Not on medications.  Essential hypertension.  On Cardizem from home   DVT Prophylaxis: SCD  Consultant: none   Code Status: Full code  Microbiology none  Antibiotics: None  Family Communication:  Patients' condition and plan of care including tests being ordered have been discussed with the patient and the patient's family who indicate understanding and agree with the plan.   Status is: Observation  The patient remains OBS appropriate and will d/c before 2 midnights.  Dispo: The patient is from: Home              Anticipated d/c is to: Home                  Severity of Illness: The appropriate patient status for this patient is OBSERVATION. Observation status is judged to be reasonable and necessary in order to provide the required intensity of service to ensure the patient's safety. The patient's presenting symptoms, physical exam findings, and initial radiographic and laboratory data in the context of their medical condition is felt to place them at decreased risk for further clinical deterioration. Furthermore, it is anticipated that the patient will be medically stable for discharge from the hospital within 2 midnights of admission.   Signed, Joycelyn Das, MD [facility] 08/08/2020

## 2020-08-08 NOTE — ED Provider Notes (Signed)
[facility] EMERGENCY DEPARTMENT Provider Note   CSN: [id number] Arrival date & time: 08/07/20  2354     History Chief Complaint  Patient presents with   Chest Pain    Jacob Sanders is a 75 y.o. male.  Patient with a history of atrial fibrillation no longer on anticoagulation, GERD, kidney stones, hypertension, hyperlipidemia here with episodes of chest "soreness".  States he woke up with soreness across his entire chest this morning.  He attributed this to pulling a muscle while moving a hose yesterday but did not have any pain at the time.  He took omeprazole and the pain went away after 2 hours.  He was fine all day, pain returned tonight when he tried to lie down to go to sleep.  Describes soreness across his entire chest lasting about 2 hours again.  EMS was called and did an EKG we did not transport him.  He took omeprazole and aspirin again and his chest pain is now gone.  There is no associated shortness of breath, cough, fever, diaphoresis, vomiting or back pain. Denies any history of CAD.  Unknown last stress test.  He is not certain if he is always in A. fib or not.  States his Coumadin was stopped due to bleeding issues. Has not had this soreness in the past.  He is chest pain-free right now.  There is no cough, fever, diaphoresis, back pain or stomach pain.  The history is provided by the patient and the spouse.  Chest Pain Associated symptoms: shortness of breath   Associated symptoms: no abdominal pain, no dizziness, no fever, no headache, no nausea, no vomiting and no weakness       Past Medical History:  Diagnosis Date   Arthritis    Atrial fibrillation (HCC)    Dysrhythmia    afib   GERD (gastroesophageal reflux disease)    History of kidney stones    passed   Hyperlipidemia    Hypertension    Nerve damage    left hand after accident- 1995   Seasonal allergies    Staph infection    post back surgeries 1998 and1999    Patient Active Problem List    Diagnosis Date Noted   Chronic sinusitis 05/31/2020   Chronic rhinitis 05/31/2020   Allergy 05/31/2020   Degeneration of lumbar or lumbosacral intervertebral disc 05/31/2020   Dermatophytosis of nail 05/31/2020   Other specified abnormal findings of blood chemistry 05/31/2020   Personal history of tobacco use, presenting hazards to health 05/31/2020   Closed fracture of distal end of left radius 07/27/2019   Dizziness 01/26/2019   Chronic back pain 11/16/2018   RLS (restless legs syndrome) 05/28/2018   Seborrheic keratoses 10/08/2017   GAD (generalized anxiety disorder) 02/25/2017   Constipation 08/21/2016   Osteoarthritis of right hip 07/17/2016   Osteoarthritis, hip, bilateral 03/27/2016   Overweight (BMI 25.0-29.9) 03/21/2016   Pain in both lower extremities 02/27/2016   Tachycardia 02/27/2016   History of atrial fibrillation without current medication 02/27/2016   Primary osteoarthritis of both knees 11/06/2015   Current smoker 12/14/2014   Vitamin D deficiency 05/17/2014   GERD (gastroesophageal reflux disease) 05/15/2014   Fatigue 03/10/2014   Arthritis 03/10/2014   Essential hypertension, benign 04/20/2012   Gout, unspecified 04/20/2012   Hyperlipemia 04/20/2012    Past Surgical History:  Procedure Laterality Date   BACK SURGERY  [id number]   discectomy   HAND SURGERY Left 1995   OPEN REDUCTION INTERNAL FIXATION (ORIF)  DISTAL RADIAL FRACTURE Left 07/29/2019   Procedure: OPEN REDUCTION INTERNAL FIXATION (ORIF) LEFT DISTAL RADIAL FRACTURE;  Surgeon: Dominica SeverinGramig, William, MD;  Location: [facility] OR;  Service: Orthopedics;  Laterality: Left;  90 mins   TONSILLECTOMY     childhood    TOOTH EXTRACTION  3 weeks ago ;   had abcess beneath tooth; experiened some bleeding for 20 hours post op   TOTAL HIP ARTHROPLASTY Left 05/22/2016   Procedure: LEFT TOTAL HIP ARTHROPLASTY ANTERIOR APPROACH;  Surgeon: Samson FredericSwinteck, Brian, MD;  Location: [facility] ORS;  Service: Orthopedics;  Laterality: Left;  Dr.  requesting RNFA   TOTAL HIP ARTHROPLASTY Right 07/17/2016   Procedure: RIGHT TOTAL HIP ARTHROPLASTY ANTERIOR APPROACH;  Surgeon: Samson FredericSwinteck, Brian, MD;  Location: [facility] ORS;  Service: Orthopedics;  Laterality: Right;  needs RNFA       Family History  Problem Relation Age of Onset   Hypertension Mother    Aneurysm Father    Hypertension Father     Social History   Tobacco Use   Smoking status: Every Day    Packs/day: 1.00    Years: 45.00    Pack years: 45.00    Types: Cigars, Cigarettes   Smokeless tobacco: Never  Vaping Use   Vaping Use: Never used  Substance Use Topics   Alcohol use: No   Drug use: No    Home Medications Prior to Admission medications   Medication Sig Start Date End Date Taking? Authorizing Provider  carbamide peroxide (DEBROX) 6.5 % OTIC solution Place in ear(s). 05/23/20   [provider]  diltiazem (CARDIZEM LA) 240 MG 24 hr tablet Take 1 tablet (240 mg total) by mouth daily. 07/03/20   Junie SpencerHawks, Christy A, FNP  doxycycline (VIBRA-TABS) 100 MG tablet Take 1 tablet (100 mg total) by mouth 2 (two) times daily. 05/28/20   Junie SpencerHawks, Christy A, FNP  meclizine (ANTIVERT) 25 MG tablet TAKE 1 TABLET BY MOUTH 3  TIMES DAILY AS NEEDED FOR  DIZZINESS Patient taking differently: Take 25 mg by mouth 3 (three) times daily as needed for dizziness. 05/30/19   Junie SpencerHawks, Christy A, FNP  naproxen (NAPROSYN) 500 MG tablet Take 500 mg by mouth 2 (two) times daily with a meal.    [provider]  omeprazole (PRILOSEC) 20 MG capsule Take 1 capsule (20 mg total) by mouth daily as needed. 07/03/20   Junie SpencerHawks, Christy A, FNP  Vitamin D, Ergocalciferol, (DRISDOL) 1.25 MG (50000 UNIT) CAPS capsule Take 1 capsule (50,000 Units total) by mouth every 7 (seven) days. 05/07/20   Junie SpencerHawks, Christy A, FNP    Allergies    Warfarin and related and Prednisone  Review of Systems   Review of Systems  Constitutional:  Negative for activity change, appetite change and fever.  HENT:  Negative for  congestion.   Respiratory:  Positive for chest tightness and shortness of breath.   Cardiovascular:  Positive for chest pain.  Gastrointestinal:  Negative for abdominal pain, nausea and vomiting.  Genitourinary:  Negative for dysuria and hematuria.  Musculoskeletal:  Negative for arthralgias and myalgias.  Skin:  Negative for rash.  Neurological:  Negative for dizziness, weakness and headaches.   all other systems are negative except as noted in the HPI and PMH.   Physical Exam Updated Vital Signs BP (!) 172/103   Pulse 100   Temp 97.8 F (36.6 C) (Oral)   Resp (!) 29   Ht 5\' 10"  (1.778 m)   Wt 86.2 kg   SpO2 97%   BMI  27.26 kg/m   Physical Exam Vitals and nursing note reviewed.  Constitutional:      General: He is not in acute distress.    Appearance: Normal appearance. He is well-developed. He is not ill-appearing.  HENT:     Head: Normocephalic and atraumatic.     Mouth/Throat:     Pharynx: No oropharyngeal exudate.  Eyes:     Conjunctiva/sclera: Conjunctivae normal.     Pupils: Pupils are equal, round, and reactive to light.  Neck:     Comments: No meningismus. Cardiovascular:     Rate and Rhythm: Tachycardia present. Rhythm irregular.     Heart sounds: Normal heart sounds. No murmur heard. Pulmonary:     Effort: Pulmonary effort is normal. No respiratory distress.     Breath sounds: Normal breath sounds.  Abdominal:     Palpations: Abdomen is soft.     Tenderness: There is no abdominal tenderness. There is no guarding or rebound.  Musculoskeletal:        General: No tenderness. Normal range of motion.     Cervical back: Normal range of motion and neck supple.  Skin:    General: Skin is warm.  Neurological:     Mental Status: He is alert and oriented to person, place, and time.     Cranial Nerves: No cranial nerve deficit.     Motor: No abnormal muscle tone.     Coordination: Coordination normal.     Comments:  5/5 strength throughout. CN 2-12 intact.Equal  grip strength.   Psychiatric:        Behavior: Behavior normal.    ED Results / Procedures / Treatments   Labs (all labs ordered are listed, but only abnormal results are displayed) Labs Reviewed  BASIC METABOLIC PANEL - Abnormal; Notable for the following components:      Result Value   Glucose, Bld 122 (*)    Calcium 8.3 (*)    All other components within normal limits  CBC - Abnormal; Notable for the following components:   RDW 17.1 (*)    All other components within normal limits  RESP PANEL BY RT-PCR (FLU A&B, COVID) ARPGX2  TROPONIN I (HIGH SENSITIVITY)  TROPONIN I (HIGH SENSITIVITY)    EKG EKG Interpretation  Date/Time:  Wednesday August 08 2020 00:07:58 EDT Ventricular Rate:  104 PR Interval:    QRS Duration: 146 QT Interval:  374 QTC Calculation: 491 R Axis:   4 Text Interpretation: Atrial fibrillation with rapid ventricular response with premature ventricular or aberrantly conducted complexes Right bundle branch block T wave abnormality, consider lateral ischemia Abnormal ECG Nonspecific ST abnormality Confirmed by Glynn Octave [phone]) on 08/08/2020 12:15:31 AM  Radiology DG Chest Portable 1 View  Result Date: 08/08/2020 CLINICAL DATA:  Chest pain EXAM: PORTABLE CHEST 1 VIEW COMPARISON:  Radiograph 08/04/2019, CT 05/22/2016 FINDINGS: Few streaky opacities in the left lung base, likely subsegmental atelectasis or scarring. No consolidation, features of edema, pneumothorax, or effusion. Tortuous brachiocephalic vasculature. Coronary artery calcifications. Cardiac size within normal limits. The aorta is calcified. The remaining cardiomediastinal contours are unremarkable. No acute osseous or soft tissue abnormality. Degenerative changes are present in the imaged spine and shoulders. IMPRESSION: Streaky opacities in the left lung base, likely atelectasis or scarring. No other acute cardiopulmonary abnormality. Coronary and aortic atherosclerosis. Electronically Signed    By: Kreg Shropshire M.D.   On: 08/08/2020 01:57   ECHOCARDIOGRAM COMPLETE  Result Date: 08/08/2020    ECHOCARDIOGRAM REPORT   Patient Name:  [name] Date of Exam: 08/08/2020 Medical Rec #:  [id number]       Height:       70.0 in Accession #:    [id number]      Weight:       190.0 lb Date of Birth:  10-05-1945       BSA:          2.042 m Patient Age:    75 years        BP:           128/83 mmHg Patient Gender: M               HR:           58 bpm. [facility] Procedure: 2D Echo, Cardiac Doppler and Color Doppler Indications:    Chest Pain  History:        Patient has prior history of Echocardiogram examinations, most                 recent 01/24/2016. Arrythmias:Tachycardia and Atrial                 Fibrillation, Signs/Symptoms:Chest Pain; Risk                 Factors:Hypertension, Dyslipidemia and Current Smoker.  Sonographer:    Mikki Harbor Referring Phys: [id number] [facility] [place] IMPRESSIONS  1. Left ventricular ejection fraction, by estimation, is 60 to 65%. The left ventricle has normal function. The left ventricle has no regional wall motion abnormalities. There is mild left ventricular hypertrophy. Left ventricular diastolic parameters are indeterminate.  2. Right ventricular systolic function is normal. The right ventricular size is normal. There is mildly elevated pulmonary artery systolic pressure.  3. Left atrial size was severely dilated.  4. Right atrial size was severely dilated.  5. The mitral valve is normal in structure. Mild mitral valve regurgitation. No evidence of mitral stenosis.  6. The aortic valve is tricuspid. Aortic valve regurgitation is not visualized. No aortic stenosis is present.  7. There is mild dilatation of the ascending aorta, measuring 37 mm. FINDINGS  Left Ventricle: Left ventricular ejection fraction, by estimation, is 60 to 65%. The left ventricle has normal function. The left ventricle has no regional wall motion abnormalities. The left ventricular  internal cavity size was normal in size. There is  mild left ventricular hypertrophy. Left ventricular diastolic parameters are indeterminate. Right Ventricle: The right ventricular size is normal. No increase in right ventricular wall thickness. Right ventricular systolic function is normal. There is mildly elevated pulmonary artery systolic pressure. The tricuspid regurgitant velocity is 2.67  m/s, and with an assumed right atrial pressure of 8 mmHg, the estimated right ventricular systolic pressure is 36.5 mmHg. Left Atrium: Left atrial size was severely dilated. Right Atrium: Right atrial size was severely dilated. Pericardium: There is no evidence of pericardial effusion. Mitral Valve: The mitral valve is normal in structure. Mild mitral valve regurgitation. No evidence of mitral valve stenosis. MV peak gradient, 3.5 mmHg. The mean mitral valve gradient is 1.0 mmHg. Tricuspid Valve: The tricuspid valve is normal in structure. Tricuspid valve regurgitation is mild . No evidence of tricuspid stenosis. Aortic Valve: The aortic valve is tricuspid. Aortic valve regurgitation is not visualized. No aortic stenosis is present. Aortic valve mean gradient measures 3.0 mmHg. Aortic valve peak gradient measures 7.0 mmHg. Aortic valve area, by VTI measures 2.46 cm. Pulmonic Valve: The pulmonic valve was not well visualized. Pulmonic valve  regurgitation is trivial. No evidence of pulmonic stenosis. Aorta: The aortic root is normal in size and structure. There is mild dilatation of the ascending aorta, measuring 37 mm. IAS/Shunts: The interatrial septum was not well visualized.  LEFT VENTRICLE PLAX 2D LVIDd:         4.56 cm  Diastology LV PW:         1.27 cm  LV e' medial:    11.00 cm/s LV IVS:        1.28 cm  LV E/e' medial:  7.7 LVOT diam:     2.00 cm  LV e' lateral:   17.30 cm/s LV SV:         59       LV E/e' lateral: 4.9 LV SV Index:   29 LVOT Area:     3.14 cm  RIGHT VENTRICLE RV Basal diam:  4.47 cm RV Mid diam:     4.28 cm RV S prime:     15.10 cm/s TAPSE (M-mode): 2.5 cm LEFT ATRIUM              Index       RIGHT ATRIUM           Index LA diam:        4.80 cm  2.35 cm/m  RA Area:     26.20 cm LA Vol (A2C):   107.0 ml 52.39 ml/m RA Volume:   73.90 ml  36.18 ml/m LA Vol (A4C):   124.0 ml 60.72 ml/m LA Biplane Vol: 124.0 ml 60.72 ml/m  AORTIC VALVE AV Area (Vmax):    2.23 cm AV Area (Vmean):   2.55 cm AV Area (VTI):     2.46 cm AV Vmax:           132.00 cm/s AV Vmean:          74.450 cm/s AV VTI:            0.238 m AV Peak Grad:      7.0 mmHg AV Mean Grad:      3.0 mmHg LVOT Vmax:         93.87 cm/s LVOT Vmean:        60.367 cm/s LVOT VTI:          0.187 m LVOT/AV VTI ratio: 0.78  AORTA Ao Root diam: 3.20 cm Ao Asc diam:  3.70 cm MITRAL VALVE               TRICUSPID VALVE MV Area (PHT): 3.93 cm    TR Peak grad:   28.5 mmHg MV Area VTI:   1.61 cm    TR Vmax:        267.00 cm/s MV Peak grad:  3.5 mmHg MV Mean grad:  1.0 mmHg    SHUNTS MV Vmax:       0.94 m/s    Systemic VTI:  0.19 m MV Vmean:      34.2 cm/s   Systemic Diam: 2.00 cm MV Decel Time: 193 msec MV E velocity: 84.80 cm/s Dina Rich MD Electronically signed by Dina Rich MD Signature Date/Time: 08/08/2020/12:37:31 PM    Final     Procedures Procedures   Medications Ordered in ED Medications - No data to display  ED Course  I have reviewed the triage vital signs and the nursing notes.  Pertinent labs & imaging results that were available during my care of the patient were reviewed by me and considered in my medical decision making (see chart  for details).    MDM Rules/Calculators/A&P                           Chest soreness this morning that resolved after 2 hours and then returned this evening.  Improved with aspirin as well as omeprazole.  EKG shows atrial fibrillation with right bundle branch block, similar to previous.  There are T wave inversions inferiorly and laterally  Troponin negative. CXR negative. Low suspicion for  PE. Heart score is 5.   No ischemic evaluation for several years.  Remains chest pain free in ED.   Agreeable to observation admission to rule out ACS. D/w Dr. Carren Rang. Final Clinical Impression(s) / ED Diagnoses Final diagnoses:  None    Rx / DC Orders ED Discharge Orders     None        Weylyn Ricciuti, Jeannett Senior, MD 08/08/20 [phone]

## 2020-08-24 ENCOUNTER — Encounter: Payer: Self-pay | Admitting: *Deleted

## 2020-08-27 ENCOUNTER — Other Ambulatory Visit: Payer: Self-pay

## 2020-08-27 ENCOUNTER — Ambulatory Visit (INDEPENDENT_AMBULATORY_CARE_PROVIDER_SITE_OTHER): Payer: Medicare Other | Admitting: Family

## 2020-08-27 ENCOUNTER — Encounter: Payer: Self-pay | Admitting: Family

## 2020-08-27 VITALS — BP 144/84 | HR 60 | Temp 98.3°F | Ht 70.0 in | Wt 200.0 lb

## 2020-08-27 DIAGNOSIS — L820 Inflamed seborrheic keratosis: Secondary | ICD-10-CM | POA: Diagnosis not present

## 2020-08-27 DIAGNOSIS — E663 Overweight: Secondary | ICD-10-CM

## 2020-08-27 DIAGNOSIS — I4891 Unspecified atrial fibrillation: Secondary | ICD-10-CM

## 2020-08-27 DIAGNOSIS — I10 Essential (primary) hypertension: Secondary | ICD-10-CM | POA: Diagnosis not present

## 2020-08-27 DIAGNOSIS — Z09 Encounter for follow-up examination after completed treatment for conditions other than malignant neoplasm: Secondary | ICD-10-CM

## 2020-08-27 DIAGNOSIS — F17200 Nicotine dependence, unspecified, uncomplicated: Secondary | ICD-10-CM | POA: Diagnosis not present

## 2020-08-27 DIAGNOSIS — L821 Other seborrheic keratosis: Secondary | ICD-10-CM

## 2020-08-27 MED ORDER — ASPIRIN EC 325 MG PO TBEC: 325.0000 mg | DELAYED_RELEASE_TABLET | Freq: Every day | ORAL | 0 refills | Status: DC

## 2020-08-27 NOTE — Patient Instructions (Signed)
Atrial Fibrillation  Atrial fibrillation is a type of irregular or rapid heartbeat (arrhythmia). In atrial fibrillation, the top part of the heart (atria) beats in an irregular pattern. This makes the heart unable to pump bloodnormally and effectively. The goal of treatment is to prevent blood clots from forming, control your heart rate, or restore your heartbeat to a normal rhythm. If this condition is not treated, it can cause serious problems, such as a weakened heart muscle (cardiomyopathy) or a stroke. What are the causes? This condition is often caused by medical conditions that damage the heart's electrical system. These include: High blood pressure (hypertension). This is the most common cause. Certain heart problems or conditions, such as heart failure, coronary artery disease, heart valve problems, or heart surgery. Diabetes. Overactive thyroid (hyperthyroidism). Obesity. Chronic kidney disease. In some cases, the cause of this condition is not known. What increases the risk? This condition is more likely to develop in: Older people. People who smoke. Athletes who do endurance exercise. People who have a family history of atrial fibrillation. Men. People who use drugs. People who drink a lot of alcohol. People who have lung conditions, such as emphysema, pneumonia, or COPD. People who have obstructive sleep apnea. What are the signs or symptoms? Symptoms of this condition include: A feeling that your heart is racing or beating irregularly. Discomfort or pain in your chest. Shortness of breath. Sudden light-headedness or weakness. Tiring easily during exercise or activity. Fatigue. Syncope (fainting). Sweating. In some cases, there are no symptoms. How is this diagnosed? Your health care provider may detect atrial fibrillation when taking your pulse. If detected, this condition may be diagnosed with: An electrocardiogram (ECG) to check electrical signals of the  heart. An ambulatory cardiac monitor to record your heart's activity for a few days. A transthoracic echocardiogram (TTE) to create pictures of your heart. A transesophageal echocardiogram (TEE) to create even closer pictures of your heart. A stress test to check your blood supply while you exercise. Imaging tests, such as a CT scan or chest X-ray. Blood tests. How is this treated? Treatment depends on underlying conditions and how you feel when you experience atrial fibrillation. This condition may be treated with: Medicines to prevent blood clots or to treat heart rate or heart rhythm problems. Electrical cardioversion to reset the heart's rhythm. A pacemaker to correct abnormal heart rhythm. Ablation to remove the heart tissue that sends abnormal signals. Left atrial appendage closure to seal the area where blood clots can form. In some cases, underlying conditions will be treated. Follow these instructions at home: Medicines Take over-the counter and prescription medicines only as told by your health care provider. Do not take any new medicines without talking to your health care provider. If you are taking blood thinners: Talk with your health care provider before you take any medicines that contain aspirin or NSAIDs, such as ibuprofen. These medicines increase your risk for dangerous bleeding. Take your medicine exactly as told, at the same time every day. Avoid activities that could cause injury or bruising, and follow instructions about how to prevent falls. Wear a medical alert bracelet or carry a card that lists what medicines you take. Lifestyle     Do not use any products that contain nicotine or tobacco, such as cigarettes, e-cigarettes, and chewing tobacco. If you need help quitting, ask your health care provider. Eat heart-healthy foods. Talk with a dietitian to make an eating plan that is right for you. Exercise regularly as told by   your health care provider. Do not  drink alcohol. Lose weight if you are overweight. Do not use drugs, including cannabis. General instructions If you have obstructive sleep apnea, manage your condition as told by your health care provider. Do not use diet pills unless your health care provider approves. Diet pills can make heart problems worse. Keep all follow-up visits as told by your health care provider. This is important. Contact a health care provider if you: Notice a change in the rate, rhythm, or strength of your heartbeat. Are taking a blood thinner and you notice more bruising. Tire more easily when you exercise or do heavy work. Have a sudden change in weight. Get help right away if you have:  Chest pain, abdominal pain, sweating, or weakness. Trouble breathing. Side effects of blood thinners, such as blood in your vomit, stool, or urine, or bleeding that cannot stop. Any symptoms of a stroke. "BE FAST" is an easy way to remember the main warning signs of a stroke: B - Balance. Signs are dizziness, sudden trouble walking, or loss of balance. E - Eyes. Signs are trouble seeing or a sudden change in vision. F - Face. Signs are sudden weakness or numbness of the face, or the face or eyelid drooping on one side. A - Arms. Signs are weakness or numbness in an arm. This happens suddenly and usually on one side of the body. S - Speech. Signs are sudden trouble speaking, slurred speech, or trouble understanding what people say. T - Time. Time to call emergency services. Write down what time symptoms started. Other signs of a stroke, such as: A sudden, severe headache with no known cause. Nausea or vomiting. Seizure. These symptoms may represent a serious problem that is an emergency. Do not wait to see if the symptoms will go away. Get medical help right away. Call your local emergency services (911 in the [place] Do not drive yourself to the hospital. Summary Atrial fibrillation is a type of irregular or rapid  heartbeat (arrhythmia). Symptoms include a feeling that your heart is beating fast or irregularly. You may be given medicines to prevent blood clots or to treat heart rate or heart rhythm problems. Get help right away if you have signs or symptoms of a stroke. Get help right away if you cannot catch your breath or have chest pain or pressure. This information is not intended to replace advice given to you by your health care provider. Make sure you discuss any questions you have with your healthcare provider. Document Revised: 06/23/2018 Document Reviewed: 06/23/2018 Elsevier Patient Education  2022 [employer]  

## 2020-08-27 NOTE — Progress Notes (Signed)
Subjective:    Patient ID: Jacob Sanders, male    DOB: Mar 01, 1945, 75 y.o.   MRN: [id number]  Chief Complaint  Patient presents with   Hospitalization Follow-up   Skin Tag    Removal    Pt presents to the office today for hospital follow up. He went to the ED on 08/08/20 for chest pain. He has hx of A Fib and was diagnosed EKG showed atrial fibrillation with right bundle branch block, similar to previous.  There are T wave inversions inferiorly and laterally. He was admitted for observation to rule out ACS.  He is not currently on anticoagulation because he did not like side effects.   He denies any chest pain, edema, SOB at this time.   He continues to smoke 1/2 pack a day.   He also a skin lesion on left shoulder. He reports this has been there for years, but has become larger and loose and gets hung on his shirts.  Hypertension This is a chronic problem. The current episode started more than 1 year ago. The problem has been waxing and waning since onset. The problem is uncontrolled. Associated symptoms include malaise/fatigue. Pertinent negatives include no peripheral edema or shortness of breath.     Review of Systems  Constitutional:  Positive for malaise/fatigue.  Respiratory:  Negative for shortness of breath.   All other systems reviewed and are negative.     Objective:   Physical Exam Vitals reviewed.  Constitutional:      General: He is not in acute distress.    Appearance: He is well-developed.  HENT:     Head: Normocephalic.     Right Ear: Tympanic membrane normal.     Left Ear: Tympanic membrane normal.  Eyes:     General:        Right eye: No discharge.        Left eye: No discharge.     Pupils: Pupils are equal, round, and reactive to light.  Neck:     Thyroid: No thyromegaly.  Cardiovascular:     Rate and Rhythm: Normal rate and regular rhythm.     Heart sounds: Normal heart sounds. No murmur heard. Pulmonary:     Effort: Pulmonary effort is  normal. No respiratory distress.     Breath sounds: Normal breath sounds. No wheezing.  Abdominal:     General: Bowel sounds are normal. There is no distension.     Palpations: Abdomen is soft.     Tenderness: There is no abdominal tenderness.  Musculoskeletal:        General: No tenderness. Normal range of motion.     Cervical back: Normal range of motion and neck supple.  Skin:    General: Skin is warm and dry.     Findings: Lesion present. No erythema or rash.     Comments: 1.1X0.6 mm waxy skin lesion  Neurological:     Mental Status: He is alert and oriented to person, place, and time.     Cranial Nerves: No cranial nerve deficit.     Deep Tendon Reflexes: Reflexes are normal and symmetric.  Psychiatric:        Behavior: Behavior normal.        Thought Content: Thought content normal.        Judgment: Judgment normal.   Cryotherapy used on skin, patient tolerated well.    BP (!) 144/84   Pulse 60   Temp 98.3 F (36.8 C) (Temporal)   Ht  5\' 10"  (1.778 m)   Wt 200 lb (90.7 kg)   SpO2 95%   BMI 28.70 kg/m      Assessment & Plan:  Jacob Sanders comes in today with chief complaint of Hospitalization Follow-up and Skin Tag (Removal )   Diagnosis and orders addressed:  1. Hospital discharge follow-up  2. Overweight (BMI 25.0-29.9)  3. Atrial fibrillation, unspecified type (HCC)  4. Seborrheic keratoses - Do not pick at or squeeze   5. Primary hypertension  6. Current smoker   Labs reviewed  Health Maintenance reviewed Diet and exercise encouraged  Follow up plan: 3 months    11-15-2000, FNP

## 2020-09-11 ENCOUNTER — Ambulatory Visit: Payer: Medicare Other | Admitting: Cardiovascular Disease

## 2020-09-12 ENCOUNTER — Other Ambulatory Visit: Payer: Self-pay | Admitting: Family

## 2020-09-18 ENCOUNTER — Ambulatory Visit: Payer: Medicare Other | Admitting: Cardiovascular Disease

## 2020-09-18 ENCOUNTER — Encounter: Payer: Self-pay | Admitting: Cardiovascular Disease

## 2020-09-18 ENCOUNTER — Other Ambulatory Visit: Payer: Self-pay

## 2020-09-18 VITALS — BP 150/100 | HR 108 | Ht 70.0 in | Wt 197.0 lb

## 2020-09-18 DIAGNOSIS — I10 Essential (primary) hypertension: Secondary | ICD-10-CM

## 2020-09-18 DIAGNOSIS — I482 Chronic atrial fibrillation, unspecified: Secondary | ICD-10-CM

## 2020-09-18 DIAGNOSIS — R079 Chest pain, unspecified: Secondary | ICD-10-CM | POA: Diagnosis not present

## 2020-09-18 DIAGNOSIS — Z720 Tobacco use: Secondary | ICD-10-CM

## 2020-09-18 MED ORDER — ASPIRIN 81 MG PO TBEC: 81.0000 mg | DELAYED_RELEASE_TABLET | Freq: Every day | ORAL | 3 refills | Status: DC

## 2020-09-18 NOTE — Progress Notes (Signed)
Cardiology Office Note   Date:  09/18/2020   ID:  Jacob Sanders, DOB [birth year], MRN [id number]  PCP:  Junie Spencer, FNP  Cardiologist:   Lorine Bears, MD   Chief Complaint  Patient presents with   New Patient (Initial Visit)    Last seen in 2018.      History of Present Illness: Jacob Sanders is a 75 y.o. male who was referred for evaluation of chest pain.   He has not seen by me since 2018. He has known history of chronic atrial fibrillation, essential hypertension and prolonged history of tobacco use. He had ABI done in 2017 for leg pain which was normal with no evidence of PAD.  He had nuclear stress testing in 2018 for exertional dyspnea and abnormal EKG that was normal. The patient had previous bilateral hip replacement.  He used to be on anticoagulation with warfarin but he reports that it was stopped few years ago due to hematuria.  He continues to smoke half a pack per day.  He had recent episode of substernal chest pain in July.  He was working in the yard with a lot of lifting and his chest felt sore.  He had some mild exertional dyspnea.  He went to the emergency room and was observed.  His troponin was normal.  Echocardiogram showed normal LV systolic function, mild mitral regurgitation and mild pulmonary hypertension.  He feels that the chest pain was musculoskeletal.    Past Medical History:  Diagnosis Date   Arthritis    Atrial fibrillation (HCC)    Dysrhythmia    afib   GERD (gastroesophageal reflux disease)    History of kidney stones    passed   Hyperlipidemia    Hypertension    Nerve damage    left hand after accident- 1995   Seasonal allergies    Staph infection    post back surgeries 1998 and1999    Past Surgical History:  Procedure Laterality Date   BACK SURGERY  [id number],[id number]   discectomy   HAND SURGERY Left 1995   OPEN REDUCTION INTERNAL FIXATION (ORIF) DISTAL RADIAL FRACTURE Left 07/29/2019   Procedure: OPEN REDUCTION INTERNAL  FIXATION (ORIF) LEFT DISTAL RADIAL FRACTURE;  Surgeon: Dominica Severin, MD;  Location: [facility] OR;  Service: Orthopedics;  Laterality: Left;  90 mins   TONSILLECTOMY     childhood    TOOTH EXTRACTION  3 weeks ago ;   had abcess beneath tooth; experiened some bleeding for 20 hours post op   TOTAL HIP ARTHROPLASTY Left 05/22/2016   Procedure: LEFT TOTAL HIP ARTHROPLASTY ANTERIOR APPROACH;  Surgeon: Samson Frederic, MD;  Location: [facility] ORS;  Service: Orthopedics;  Laterality: Left;  Dr. requesting RNFA   TOTAL HIP ARTHROPLASTY Right 07/17/2016   Procedure: RIGHT TOTAL HIP ARTHROPLASTY ANTERIOR APPROACH;  Surgeon: Samson Frederic, MD;  Location: [facility] ORS;  Service: Orthopedics;  Laterality: Right;  needs RNFA     Current Outpatient Medications  Medication Sig Dispense Refill   aspirin EC 325 MG tablet Take 1 tablet (325 mg total) by mouth daily. 90 tablet 0   diltiazem (CARDIZEM LA) 240 MG 24 hr tablet Take 1 tablet (240 mg total) by mouth daily. 90 tablet 1   meclizine (ANTIVERT) 25 MG tablet TAKE 1 TABLET BY MOUTH 3  TIMES DAILY AS NEEDED FOR  DIZZINESS 180 tablet 0   omeprazole (PRILOSEC) 20 MG capsule Take 1 capsule (20 mg total) by mouth daily as needed. 90 capsule 1  No current facility-administered medications for this visit.    Allergies:   Warfarin and related and Prednisone    Social History:  The patient  reports that he has been smoking cigars and cigarettes. He has a 45.00 pack-year smoking history. He has never used smokeless tobacco. He reports that he does not drink alcohol and does not use drugs.   Family History:  The patient's family history includes Aneurysm in his father; Hypertension in his father and mother.    ROS:  Please see the history of present illness.   Otherwise, review of systems are positive for none.   All other systems are reviewed and negative.    PHYSICAL EXAM: VS:  BP (!) 150/100 (BP Location: Right Arm, Patient Position: Sitting, Cuff Size: Normal)   Pulse  (!) 108   Ht 5\' 10"  (1.778 m)   Wt 197 lb (89.4 kg)   BMI 28.27 kg/m  , BMI Body mass index is 28.27 kg/m. GEN: Well nourished, well developed, in no acute distress  HEENT: normal  Neck: no JVD, carotid bruits, or masses Cardiac: ; Irregularly irregular no murmurs, rubs, or gallops,no edema  Respiratory:  clear to auscultation bilaterally, normal work of breathing GI: soft, nontender, nondistended, + BS MS: no deformity or atrophy  Skin: warm and dry, no rash Neuro:  Strength and sensation are intact Psych: euthymic mood, full affect  EKG:  EKG is ordered today. EKG showed atrial fibrillation with ventricular rate of 108 bpm.  Right bundle branch block.  Nonspecific T wave changes.   Recent Labs: 05/03/2020: ALT 10; TSH 2.090 08/08/2020: BUN 18; Creatinine, Ser 0.96; Hemoglobin 14.7; Platelets 227; Potassium 3.6; Sodium 139    Lipid Panel    Component Value Date/Time   CHOL 191 05/03/2020 0945   TRIG 80 05/03/2020 0945   HDL 53 05/03/2020 0945   CHOLHDL 3.6 05/03/2020 0945   LDLCALC 123 (H) 05/03/2020 0945      Wt Readings from Last 3 Encounters:  09/18/20 197 lb (89.4 kg)  08/27/20 200 lb (90.7 kg)  08/08/20 190 lb (86.2 kg)      PAD Screen 12/18/2015  Previous PAD dx? No  Previous surgical procedure? No  Pain with walking? Yes  Subsides with rest? No  Feet/toe relief with dangling? No  Painful, non-healing ulcers? No  Extremities discolored? No      ASSESSMENT AND PLAN:   1.  Atypical chest pain: I agree that his chest pain is atypical but he has exertional dyspnea and multiple risk factors for coronary artery disease.  Recommend evaluation with a Lexiscan Myoview.  He is not able to exercise on a treadmill due to previous bilateral hip replacement.  He is not a good candidate for cardiac CTA given his tachycardia.  2. Chronic atrial fibrillation: His ventricular rate is elevated today but normally his ventricular rate is controlled with most recent heart  rate in the 60.  Thus, I elected not to make changes to diltiazem.  We will monitor for now. CHA2DS2-VASc score is 2.  He used to be on warfarin but it was stopped due to hematuria according to him.  I had a prolonged discussion with him about the risks and benefit of long-term anticoagulation.  I suggested starting Eliquis but he is very concerned about the cost and he is going to check with his insurance.  Overall, he seems to be very hesitant about any form of anticoagulation.   In the meanwhile, continue aspirin 81 mg once daily.  3. Essential hypertension: Blood pressure is elevated today but normally it is more controlled.  Continue to monitor and will consider adding an ARB if blood pressure remains elevated.  4. Tobacco use: I again Discussed the importance of smoking cessation.   Disposition:   FU with me in 3 months.   Signed,  Lorine Bears, MD  09/18/2020 8:44 AM    [facility]

## 2020-09-18 NOTE — Patient Instructions (Signed)
Medication Instructions:  DECREASE aspirin to 81 mg daily  --Check with your insurance company about the cost of Eliquis  *If you need a refill on your cardiac medications before your next appointment, please call your pharmacy*  Testing/Procedures: Your physician has requested that you have a lexiscan myoview. For further information please visit [email or web address] Please follow instruction sheet, as given. This will take place at [address] [facility], [address]  How to prepare for your Myocardial Perfusion Test: Do not eat or drink 3 hours prior to your test, except you may have water. Do not consume products containing caffeine (regular or decaffeinated) 12 hours prior to your test. (ex: coffee, chocolate, sodas, tea). Do bring a list of your current medications with you.  If not listed below, you may take your medications as normal. Do wear comfortable clothes (no dresses or overalls) and walking shoes, tennis shoes preferred (No heels or open toe shoes are allowed). Do NOT wear cologne, perfume, aftershave, or lotions (deodorant is allowed). The test will take approximately 3 to 4 hours to complete If these instructions are not followed, your test will have to be rescheduled.  Follow-Up: At [facility], you and your health needs are our priority.  As part of our continuing mission to provide you with exceptional heart care, we have created designated Provider Care Teams.  These Care Teams include your primary Cardiologist (physician) and Advanced Practice Providers (APPs -  Physician Assistants and Nurse Practitioners) who all work together to provide you with the care you need, when you need it.  We recommend signing up for the patient portal called "MyChart".  Sign up information is provided on this After Visit Summary.  MyChart is used to connect with patients for Virtual Visits (Telemedicine).  Patients are able to view lab/test results, encounter notes, upcoming appointments, etc.   Non-urgent messages can be sent to your provider as well.   To learn more about what you can do with MyChart, go to [email or web address].    Your next appointment:   3 month(s)  The format for your next appointment:   In Person  Provider:   Lorine Bears, MD

## 2020-10-02 ENCOUNTER — Telehealth (HOSPITAL_COMMUNITY): Payer: Self-pay | Admitting: *Deleted

## 2020-10-02 NOTE — Telephone Encounter (Signed)
Close encounter 

## 2020-10-03 ENCOUNTER — Ambulatory Visit (HOSPITAL_COMMUNITY)
Admission: RE | Admit: 2020-10-03 | Discharge: 2020-10-03 | Disposition: A | Discharge status: Home / Self Care | Payer: Medicare Other | Source: Ambulatory Visit | Attending: Cardiology | Admitting: Cardiology

## 2020-10-03 ENCOUNTER — Other Ambulatory Visit: Payer: Self-pay

## 2020-10-03 DIAGNOSIS — R079 Chest pain, unspecified: Secondary | ICD-10-CM | POA: Diagnosis not present

## 2020-10-03 LAB — MYOCARDIAL PERFUSION IMAGING
Peak HR: 91 {beats}/min
Rest HR: 75 {beats}/min
Rest Nuclear Isotope Dose: 10.9 mCi
SDS: 0
SRS: 2
SSS: 2
ST Depression (mm): 0 mm
Stress Nuclear Isotope Dose: 31 mCi
TID: 1.08

## 2020-10-03 MED ORDER — TECHNETIUM TC 99M TETROFOSMIN IV KIT
10.9000 | PACK | Freq: Once | INTRAVENOUS | Status: AC | PRN
  Administered 2020-10-03: 10.9 via INTRAVENOUS
  Filled 2020-10-03: qty 11

## 2020-10-03 MED ORDER — TECHNETIUM TC 99M TETROFOSMIN IV KIT
31.0000 | PACK | Freq: Once | INTRAVENOUS | Status: AC | PRN
  Administered 2020-10-03: 31 via INTRAVENOUS
  Filled 2020-10-03: qty 31

## 2020-10-03 MED ORDER — REGADENOSON 0.4 MG/5ML IV SOLN
0.4000 mg | Freq: Once | INTRAVENOUS | Status: AC
  Administered 2020-10-03: 0.4 mg via INTRAVENOUS

## 2020-11-01 ENCOUNTER — Ambulatory Visit: Payer: Medicare Other | Admitting: Family

## 2020-11-03 ENCOUNTER — Other Ambulatory Visit: Payer: Self-pay | Admitting: Family

## 2020-11-03 DIAGNOSIS — I10 Essential (primary) hypertension: Secondary | ICD-10-CM

## 2020-11-03 DIAGNOSIS — I4891 Unspecified atrial fibrillation: Secondary | ICD-10-CM

## 2020-11-19 ENCOUNTER — Emergency Department (HOSPITAL_COMMUNITY): Payer: Medicare Other

## 2020-11-19 ENCOUNTER — Other Ambulatory Visit: Payer: Self-pay

## 2020-11-19 ENCOUNTER — Emergency Department (HOSPITAL_COMMUNITY)
Admission: EM | Admit: 2020-11-19 | Discharge: 2020-11-19 | Disposition: A | Discharge status: Home / Self Care | Payer: Medicare Other | Attending: Emergency Medicine | Admitting: Emergency Medicine

## 2020-11-19 DIAGNOSIS — Z743 Need for continuous supervision: Secondary | ICD-10-CM | POA: Diagnosis not present

## 2020-11-19 DIAGNOSIS — Z5321 Procedure and treatment not carried out due to patient leaving prior to being seen by health care provider: Secondary | ICD-10-CM | POA: Insufficient documentation

## 2020-11-19 DIAGNOSIS — I4891 Unspecified atrial fibrillation: Secondary | ICD-10-CM | POA: Diagnosis not present

## 2020-11-19 DIAGNOSIS — I499 Cardiac arrhythmia, unspecified: Secondary | ICD-10-CM | POA: Diagnosis not present

## 2020-11-19 DIAGNOSIS — R079 Chest pain, unspecified: Secondary | ICD-10-CM | POA: Insufficient documentation

## 2020-11-19 DIAGNOSIS — R6889 Other general symptoms and signs: Secondary | ICD-10-CM | POA: Diagnosis not present

## 2020-11-19 DIAGNOSIS — R0789 Other chest pain: Secondary | ICD-10-CM | POA: Diagnosis not present

## 2020-11-19 LAB — BASIC METABOLIC PANEL
Anion gap: 9 (ref 5–15)
BUN: 10 mg/dL (ref 8–23)
CO2: 24 mmol/L (ref 22–32)
Calcium: 8.8 mg/dL — ABNORMAL LOW (ref 8.9–10.3)
Chloride: 106 mmol/L (ref 98–111)
Creatinine, Ser: 0.94 mg/dL (ref 0.61–1.24)
GFR, Estimated: >60 mL/min (ref >60)
Glucose, Bld: 98 mg/dL (ref 70–99)
Potassium: 3.9 mmol/L (ref 3.5–5.1)
Sodium: 139 mmol/L (ref 135–145)

## 2020-11-19 LAB — CBC
HCT: 49.1 % (ref 39.0–52.0)
Hemoglobin: 16 g/dL (ref 13.0–17.0)
MCH: 29.9 pg (ref 26.0–34.0)
MCHC: 32.6 g/dL (ref 30.0–36.0)
MCV: 91.6 fL (ref 80.0–100.0)
Platelets: 254 10*3/uL (ref 150–400)
RBC: 5.36 MIL/uL (ref 4.22–5.81)
RDW: 14.8 % (ref 11.5–15.5)
WBC: 10.1 10*3/uL (ref 4.0–10.5)
nRBC: 0 % (ref 0.0–0.2)

## 2020-11-19 LAB — TROPONIN I (HIGH SENSITIVITY): Troponin I (High Sensitivity): 29 ng/L — ABNORMAL HIGH (ref <18)

## 2020-11-19 IMAGING — CR DG CHEST 2V
2 series · 2 of 2 positions shown · non-contrast
Comparison: [DATE], [DATE], chest CT [DATE]

CLINICAL DATA: Chest pain

EXAM:
CHEST - 2 VIEW

[chest lat]
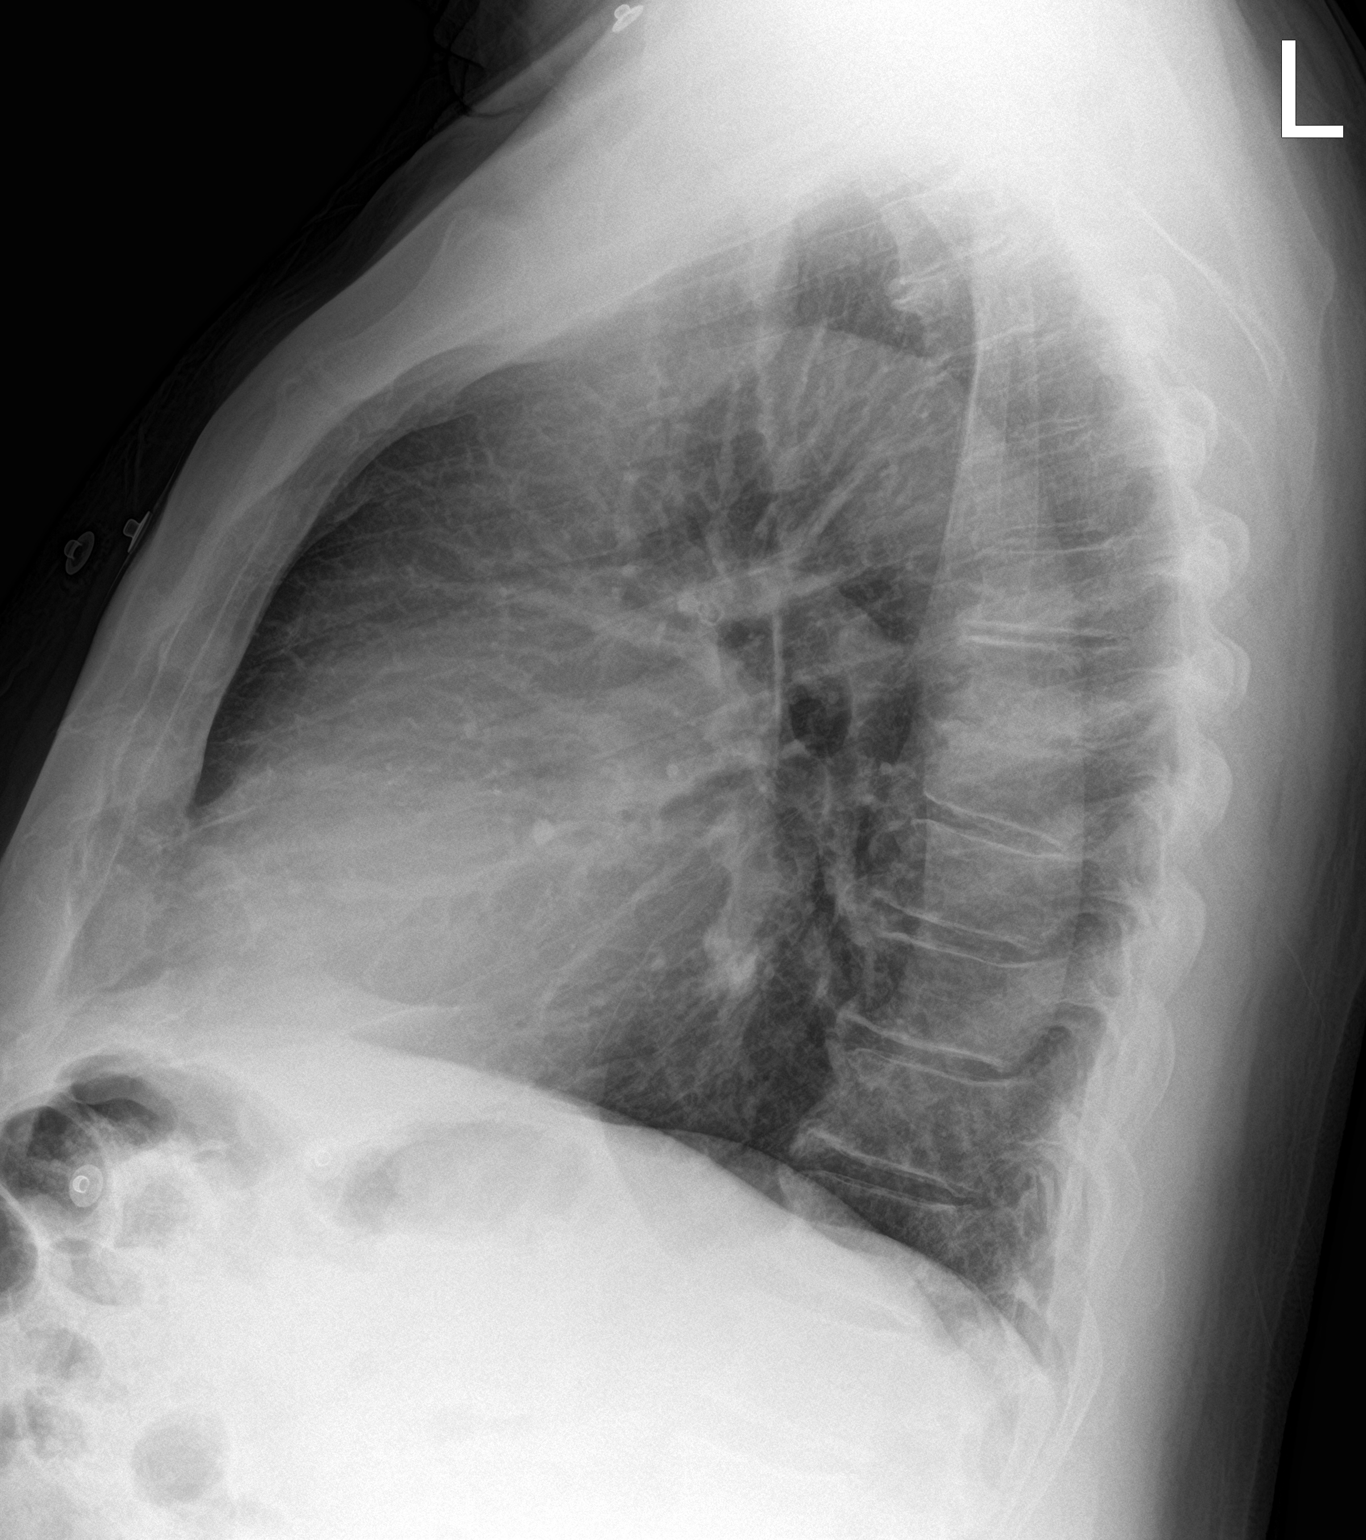

[chest pa]
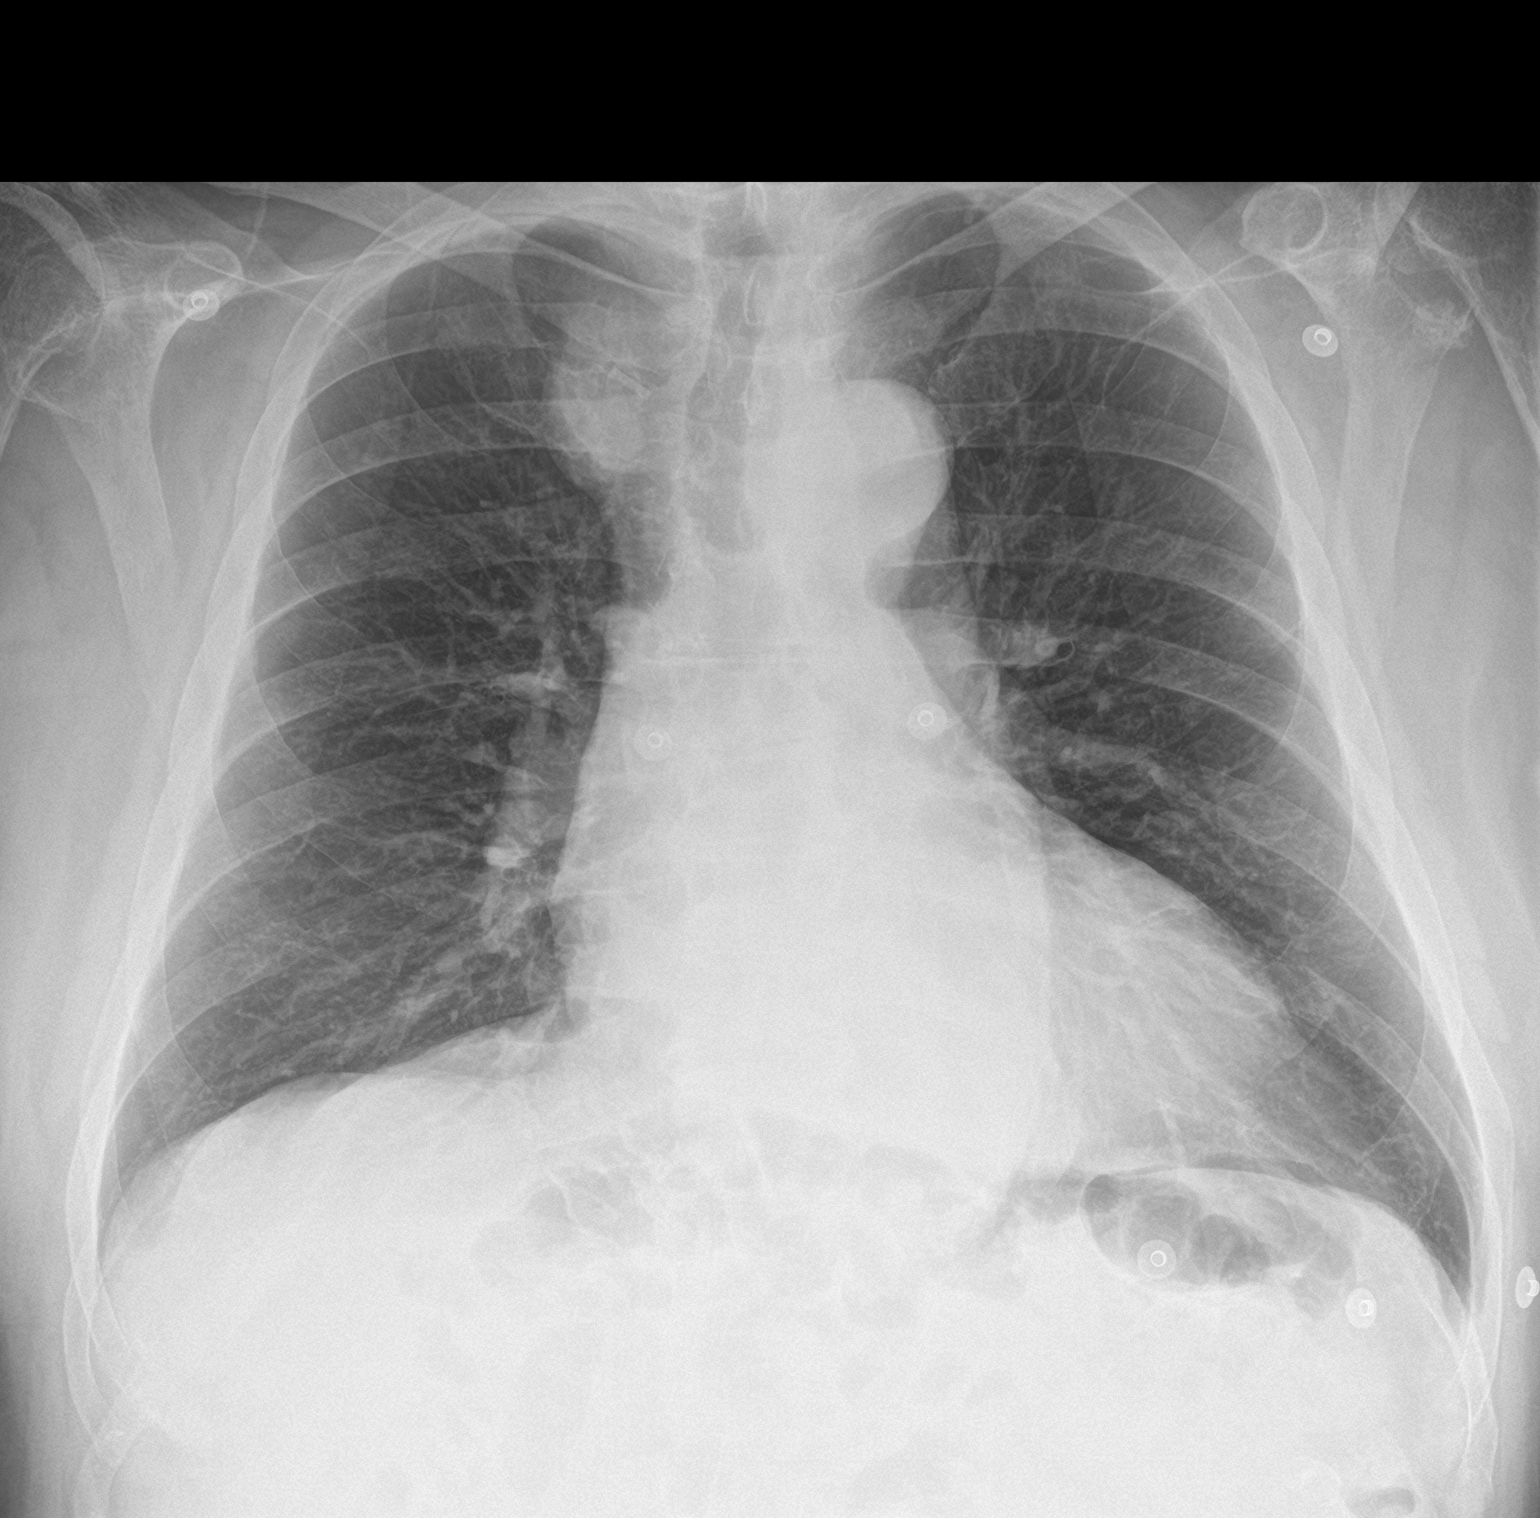

[2 of 2 positions shown; findings below may reference images not displayed]

FINDINGS: No focal opacity, pleural effusion, or pneumothorax. Stable
cardiomediastinal silhouette with right paratracheal opacity which
appears to correspond to vasculature on CT. No pneumothorax. Slight
progression of degenerative change at the midthoracic spine
IMPRESSION: No active cardiopulmonary disease.

## 2020-11-19 NOTE — ED Provider Notes (Signed)
Emergency Medicine Provider Triage Evaluation Note  Jacob Sanders , a 75 y.o. male  was evaluated in triage.  Pt presents to the emergency department with atrial fibrillation and RVR.  Patient has a history of paroxysmal atrial fibrillation.  He has been taking all of his medications as prescribed.  Found to be in A. fib prior to arrival.  Was having chest pain earlier today but is currently asymptomatic.  Review of Systems  Positive:  Negative: Shortness of breath, nausea, vomiting, diarrhea.  Physical Exam  BP (!) 179/120   Pulse 72   Temp 98 F (36.7 C)   Resp 16   SpO2 98%  Gen:   Awake, no distress   Resp:  Normal effort MSK:   Moves extremities without difficulty  Other:    Medical Decision Making  Medically screening exam initiated at 5:48 PM.  Appropriate orders placed.  Jacob Sanders was informed that the remainder of the evaluation will be completed by another provider, this initial triage assessment does not replace that evaluation, and the importance of remaining in the ED until their evaluation is complete.  Atrial fibrillation with rapid ventricular response.  Currently asymptomatic and hemodynamically stable.   Jacob Loh [place], PA-C 11/19/20 1750    Jacob Sleeper, MD 11/19/20 [phone]

## 2020-11-19 NOTE — ED Triage Notes (Signed)
Patient arrived by [facility] from home with complaint of cp that started after lunch, has hx of intermittent a. Fib and received and HR 90-100s. No pain on arrival. Received 324mg  asa today

## 2020-11-19 NOTE — ED Notes (Signed)
Pt left AMA  Pt IV was took out

## 2020-11-20 ENCOUNTER — Other Ambulatory Visit: Payer: Self-pay | Admitting: Family

## 2020-11-20 ENCOUNTER — Telehealth: Payer: Self-pay | Admitting: Family

## 2020-11-20 NOTE — Telephone Encounter (Signed)
Pts wife called stating that Jacob Sanders went to ER last night because his BP was 192/127 and pulse was 120.   Jacob Sanders has appt with Christy on 11/15. Wife wants to know if Neysa Bonito thinks Jacob Sanders should 1.5 pills of his BP medicine until his appt?  Says BP today is 157/97 and pulse is 89

## 2020-11-21 ENCOUNTER — Telehealth: Payer: Self-pay | Admitting: Family

## 2020-11-21 NOTE — Telephone Encounter (Signed)
Since his pulse is within normal range and his blood pressure is improved, albeit still slightly elevated, I do not recommend increasing his medication.  She may consider adding another antihypertensive that will not affect his heart rate if his blood pressure remains above 150s over 90s going forward.  I read his last cardiology note and because he has had bradycardia into the 60s the cardiologist was he is very hesitant to advance his diltiazem.  Would certainly recommend the patient reaching out to his cardiologist as well to see if perhaps he would like to see him sooner than what ever his next scheduled checkup is.

## 2020-11-21 NOTE — Telephone Encounter (Signed)
lmtcb

## 2020-11-22 ENCOUNTER — Encounter: Payer: Self-pay | Admitting: Family

## 2020-11-22 ENCOUNTER — Ambulatory Visit (INDEPENDENT_AMBULATORY_CARE_PROVIDER_SITE_OTHER): Payer: Medicare Other | Admitting: Family

## 2020-11-22 DIAGNOSIS — J019 Acute sinusitis, unspecified: Secondary | ICD-10-CM | POA: Diagnosis not present

## 2020-11-22 DIAGNOSIS — R399 Unspecified symptoms and signs involving the genitourinary system: Secondary | ICD-10-CM

## 2020-11-22 DIAGNOSIS — I10 Essential (primary) hypertension: Secondary | ICD-10-CM

## 2020-11-22 MED ORDER — AMOXICILLIN-POT CLAVULANATE 875-125 MG PO TABS: 1.0000 | ORAL_TABLET | Freq: Two times a day (BID) | ORAL | 0 refills | Status: DC

## 2020-11-22 MED ORDER — DILTIAZEM HCL ER COATED BEADS 300 MG PO CP24: 300.0000 mg | ORAL_CAPSULE | Freq: Every day | ORAL | 1 refills | Status: DC

## 2020-11-22 NOTE — Telephone Encounter (Signed)
Left VM to call me back if needs to speak with someone before appt today with provider

## 2020-11-22 NOTE — Progress Notes (Signed)
Virtual Visit  Note Due to COVID-19 pandemic this visit was conducted virtually. This visit type was conducted due to national recommendations for restrictions regarding the COVID-19 Pandemic (e.g. social distancing, sheltering in place) in an effort to limit this patient's exposure and mitigate transmission in our community. All issues noted in this document were discussed and addressed.  A physical exam was not performed with this format.  I connected with Jacob Sanders on 11/22/20 at 11:00 AM  by telephone and verified that I am speaking with the correct person using two identifiers. Jacob Sanders is currently located at home and wife is currently with him during visit. The provider, Jannifer Rodney, FNP is located in their office at time of visit.  I discussed the limitations, risks, security and privacy concerns of performing an evaluation and management service by telephone and the availability of in person appointments. I also discussed with the patient that there may be a patient responsible charge related to this service. The patient expressed understanding and agreed to proceed.  Jacob Sanders, Jacob Sanders are scheduled for a virtual visit with your provider today.    Just as we do with appointments in the office, we must obtain your consent to participate.  Your consent will be active for this visit and any virtual visit you may have with one of our providers in the next 365 days.    If you have a MyChart account, I can also send a copy of this consent to you electronically.  All virtual visits are billed to your insurance company just like a traditional visit in the office.  As this is a virtual visit, video technology does not allow for your provider to perform a traditional examination.  This may limit your provider's ability to fully assess your condition.  If your provider identifies any concerns that need to be evaluated in person or the need to arrange testing such as labs, EKG, etc, we will  make arrangements to do so.    Although advances in technology are sophisticated, we cannot ensure that it will always work on either your end or our end.  If the connection with a video visit is poor, we may have to switch to a telephone visit.  With either a video or telephone visit, we are not always able to ensure that we have a secure connection.   I need to obtain your verbal consent now.   Are you willing to proceed with your visit today?   Jacob Sanders has provided verbal consent on 11/22/2020 for a virtual visit (video or telephone).   Jannifer Rodney, [place] 11/22/2020  11:01 AM   History and Present Illness:  Hypertension This is a chronic problem. The current episode started more than 1 year ago. The problem has been waxing and waning since onset. The problem is uncontrolled. Associated symptoms include headaches, malaise/fatigue and palpitations. Pertinent negatives include no peripheral edema. Risk factors for coronary artery disease include dyslipidemia. Past treatments include calcium channel blockers. The current treatment provides mild improvement.  Sinusitis This is a new problem. The current episode started 1 to 4 weeks ago. The problem has been gradually worsening since onset. There has been no fever. The pain is mild. Associated symptoms include congestion, coughing, headaches, a hoarse voice and sinus pressure. Pertinent negatives include no ear pain, sneezing or sore throat. (Teeth hurt ) Past treatments include acetaminophen. The treatment provided mild relief.  Dysuria  This is a new problem. The current episode started  yesterday. The problem occurs every urination. The problem has been gradually worsening. The quality of the pain is described as burning. The pain is at a severity of 1/10. The pain is mild. Associated symptoms include hesitancy and urgency. Pertinent negatives include no discharge, flank pain, frequency, hematuria or nausea. He has tried increased fluids  (AZO) for the symptoms. The treatment provided mild relief.     Review of Systems  Constitutional:  Positive for malaise/fatigue.  HENT:  Positive for congestion, hoarse voice and sinus pressure. Negative for ear pain, sneezing and sore throat.   Respiratory:  Positive for cough.   Cardiovascular:  Positive for palpitations.  Gastrointestinal:  Negative for nausea.  Genitourinary:  Positive for dysuria, hesitancy and urgency. Negative for flank pain, frequency and hematuria.  Neurological:  Positive for headaches.    Observations/Objective: No SOB or distress noted, hoarse voice   Assessment and Plan: 1. Primary hypertension Will increase Cardizem to 300 mg from from 240 mg. Home pulse has been 100-110.  Keep follow up with me next week - diltiazem (CARDIZEM CD) 300 MG 24 hr capsule; Take 1 capsule (300 mg total) by mouth daily.  Dispense: 90 capsule; Refill: 1  2. Acute sinusitis, recurrence not specified, unspecified location - Take meds as prescribed - Use a cool mist humidifier  -Use saline nose sprays frequently -Force fluids -For any cough or congestion  Use plain Mucinex- regular strength or max strength is fine -For fever or aces or pains- take tylenol or ibuprofen. -Throat lozenges if help - amoxicillin-clavulanate (AUGMENTIN) 875-125 MG tablet; Take 1 tablet by mouth 2 (two) times daily.  Dispense: 14 tablet; Refill: 0  3. UTI symptoms Force fluids AZO over the counter X2 days Call if symptoms worsen or do not improve  - amoxicillin-clavulanate (AUGMENTIN) 875-125 MG tablet; Take 1 tablet by mouth 2 (two) times daily.  Dispense: 14 tablet; Refill: 0     I discussed the assessment and treatment plan with the patient. The patient was provided an opportunity to ask questions and all were answered. The patient agreed with the plan and demonstrated an understanding of the instructions.   The patient was advised to call back or seek an in-person evaluation if the  symptoms worsen or if the condition fails to improve as anticipated.  The above assessment and management plan was discussed with the patient. The patient verbalized understanding of and has agreed to the management plan. Patient is aware to call the clinic if symptoms persist or worsen. Patient is aware when to return to the clinic for a follow-up visit. Patient educated on when it is appropriate to go to the emergency department.   Time call ended:  11:12 AM   I provided 12 minutes of  non face-to-face time during this encounter.    Evelina Dun, FNP

## 2020-11-22 NOTE — Telephone Encounter (Signed)
Please call pt about increasing his b/p meds. He has an appt next week w/you , but he wants to talk to a nurse ASAP.

## 2020-11-27 ENCOUNTER — Other Ambulatory Visit: Payer: Self-pay

## 2020-11-27 ENCOUNTER — Ambulatory Visit (INDEPENDENT_AMBULATORY_CARE_PROVIDER_SITE_OTHER): Payer: Medicare Other | Admitting: Family

## 2020-11-27 ENCOUNTER — Encounter: Payer: Self-pay | Admitting: Family

## 2020-11-27 VITALS — BP 157/98 | HR 94 | Temp 97.3°F | Ht 70.0 in | Wt 204.8 lb

## 2020-11-27 DIAGNOSIS — K5900 Constipation, unspecified: Secondary | ICD-10-CM

## 2020-11-27 DIAGNOSIS — M545 Low back pain, unspecified: Secondary | ICD-10-CM

## 2020-11-27 DIAGNOSIS — M1990 Unspecified osteoarthritis, unspecified site: Secondary | ICD-10-CM | POA: Diagnosis not present

## 2020-11-27 DIAGNOSIS — F17200 Nicotine dependence, unspecified, uncomplicated: Secondary | ICD-10-CM

## 2020-11-27 DIAGNOSIS — E785 Hyperlipidemia, unspecified: Secondary | ICD-10-CM | POA: Diagnosis not present

## 2020-11-27 DIAGNOSIS — E559 Vitamin D deficiency, unspecified: Secondary | ICD-10-CM | POA: Diagnosis not present

## 2020-11-27 DIAGNOSIS — M5137 Other intervertebral disc degeneration, lumbosacral region: Secondary | ICD-10-CM | POA: Diagnosis not present

## 2020-11-27 DIAGNOSIS — I10 Essential (primary) hypertension: Secondary | ICD-10-CM

## 2020-11-27 DIAGNOSIS — M51379 Other intervertebral disc degeneration, lumbosacral region without mention of lumbar back pain or lower extremity pain: Secondary | ICD-10-CM

## 2020-11-27 DIAGNOSIS — K219 Gastro-esophageal reflux disease without esophagitis: Secondary | ICD-10-CM

## 2020-11-27 DIAGNOSIS — G8929 Other chronic pain: Secondary | ICD-10-CM | POA: Diagnosis not present

## 2020-11-27 DIAGNOSIS — F411 Generalized anxiety disorder: Secondary | ICD-10-CM

## 2020-11-27 DIAGNOSIS — E663 Overweight: Secondary | ICD-10-CM

## 2020-11-27 MED ORDER — BUSPIRONE HCL 5 MG PO TABS: 5.0000 mg | ORAL_TABLET | Freq: Two times a day (BID) | ORAL | 1 refills | Status: DC

## 2020-11-27 NOTE — Progress Notes (Signed)
Subjective:    Patient ID: Jacob Sanders, male    DOB: [birth year], 75 y.o.   MRN: [id number]  Chief Complaint  Patient presents with   Medical Management of Chronic Issues   Pt calls the office today for chronic follow up.  He see's the [place] once a year.   Pt's BP is elevated today. He has not started his increased dose of Cardizem from 240 mg. Hypertension This is a chronic problem. The current episode started more than 1 year ago. The problem has been waxing and waning since onset. The problem is uncontrolled. Associated symptoms include anxiety and malaise/fatigue. Pertinent negatives include no peripheral edema or shortness of breath. Risk factors for coronary artery disease include dyslipidemia, obesity, male gender and sedentary lifestyle. The current treatment provides moderate improvement.  Gastroesophageal Reflux He complains of belching and heartburn. This is a chronic problem. The current episode started more than 1 year ago. The problem occurs occasionally. The symptoms are aggravated by smoking. Risk factors include obesity. He has tried a PPI for the symptoms. The treatment provided moderate relief.  Arthritis Presents for follow-up visit. He complains of pain and stiffness. The symptoms have been stable. Affected locations include the right knee, left knee, left MCP and right MCP. His pain is at a severity of 6/10.  Constipation This is a chronic problem. The current episode started more than 1 year ago. His stool frequency is 1 time per day. He has tried diet changes for the symptoms. The treatment provided moderate relief.  Hyperlipidemia This is a chronic problem. The current episode started more than 1 year ago. He has no history of obesity. Pertinent negatives include no shortness of breath. Current antihyperlipidemic treatment includes statins. The current treatment provides moderate improvement of lipids. Risk factors for coronary artery disease include dyslipidemia, male  sex, hypertension and a sedentary lifestyle.  Anxiety Presents for follow-up visit. Symptoms include depressed mood, excessive worry, irritability and nervous/anxious behavior. Patient reports no shortness of breath. Symptoms occur most days. The severity of symptoms is moderate.    Nicotine Dependence Presents for follow-up visit. Symptoms include irritability. His urge triggers include company of smokers. The symptoms have been stable. He smokes < 1/2 a pack of cigarettes per day.     Review of Systems  Constitutional:  Positive for irritability and malaise/fatigue.  Respiratory:  Negative for shortness of breath.   Gastrointestinal:  Positive for constipation and heartburn.  Musculoskeletal:  Positive for arthritis and stiffness.  Psychiatric/Behavioral:  The patient is nervous/anxious.   All other systems reviewed and are negative.     Objective:   Physical Exam Vitals reviewed.  Constitutional:      General: He is not in acute distress.    Appearance: He is well-developed.  HENT:     Head: Normocephalic.     Right Ear: Tympanic membrane normal.     Left Ear: Tympanic membrane normal.  Eyes:     General:        Right eye: No discharge.        Left eye: No discharge.     Pupils: Pupils are equal, round, and reactive to light.  Neck:     Thyroid: No thyromegaly.  Cardiovascular:     Rate and Rhythm: Normal rate and regular rhythm.     Heart sounds: Normal heart sounds. No murmur heard. Pulmonary:     Effort: Pulmonary effort is normal. No respiratory distress.     Breath sounds: Normal breath sounds.  No wheezing.  Abdominal:     General: Bowel sounds are normal. There is no distension.     Palpations: Abdomen is soft.     Tenderness: There is no abdominal tenderness.  Musculoskeletal:        General: No tenderness. Normal range of motion.     Cervical back: Normal range of motion and neck supple.  Skin:    General: Skin is warm and dry.     Findings: No erythema  or rash.  Neurological:     Mental Status: He is alert and oriented to person, place, and time.     Cranial Nerves: No cranial nerve deficit.     Deep Tendon Reflexes: Reflexes are normal and symmetric.  Psychiatric:        Behavior: Behavior normal.        Thought Content: Thought content normal.        Judgment: Judgment normal.      BP (!) 157/98   Pulse 94   Temp (!) 97.3 F (36.3 C) (Temporal)   Ht 5\' 10"  (1.778 m)   Wt 204 lb 12.8 oz (92.9 kg)   BMI 29.39 kg/m      Assessment & Plan:  Jacob Sanders comes in today with chief complaint of Medical Management of Chronic Issues   Diagnosis and orders addressed:  1. Primary hypertension -Start Cardizem 300 mg   2. Gastroesophageal reflux disease, unspecified whether esophagitis present  3. Arthritis  4. Degeneration of lumbar or lumbosacral intervertebral disc  5. Overweight (BMI 25.0-29.9)  6. Constipation, unspecified constipation type  7. GAD (generalized anxiety disorder) Start Buspar 5 mg TID prn  - busPIRone (BUSPAR) 5 MG tablet; Take 1 tablet (5 mg total) by mouth 2 (two) times daily.  Dispense: 90 tablet; Refill: 1  8. Chronic midline low back pain without sciatica  9. Hyperlipidemia, unspecified hyperlipidemia type  10. Current smoker  11. Vitamin D deficiency   Labs pending Health Maintenance reviewed Diet and exercise encouraged  Follow up plan: 1 month to recheck BP    11-15-2000, FNP

## 2020-11-27 NOTE — Patient Instructions (Signed)

## 2020-11-28 ENCOUNTER — Other Ambulatory Visit: Payer: Self-pay | Admitting: Family

## 2020-12-05 NOTE — Telephone Encounter (Signed)
Patient already been seen .

## 2020-12-11 ENCOUNTER — Encounter: Payer: Self-pay | Admitting: Nurse Practitioner

## 2020-12-11 ENCOUNTER — Ambulatory Visit (INDEPENDENT_AMBULATORY_CARE_PROVIDER_SITE_OTHER): Payer: Medicare Other | Admitting: Nurse Practitioner

## 2020-12-11 DIAGNOSIS — J019 Acute sinusitis, unspecified: Secondary | ICD-10-CM | POA: Diagnosis not present

## 2020-12-11 MED ORDER — SALINE SPRAY 0.65 % NA SOLN
1.0000 | NASAL | 0 refills | Status: DC | PRN
Start: 2020-12-11 — End: 2022-12-31

## 2020-12-11 MED ORDER — AZITHROMYCIN 250 MG PO TABS: ORAL_TABLET | ORAL | 0 refills | Status: AC

## 2020-12-11 NOTE — Patient Instructions (Signed)

## 2020-12-11 NOTE — Assessment & Plan Note (Signed)
Take meds as prescribed - Use a cool mist humidifier  -Use saline nose sprays frequently -Force fluids -For fever or aches or pains- take Tylenol or ibuprofen. -Azithromycin 250 mg tablet by mouth, 500 mg day 1 250 mg tablet day 2-5. -Education provided to patient, patient verbalized understanding. -Rx sent to pharmacy. -If symptoms do not improve, she may need to be COVID tested to rule this out Follow up with worsening unresolved symptoms

## 2020-12-11 NOTE — Progress Notes (Signed)
   Virtual Visit  Note Due to COVID-19 pandemic this visit was conducted virtually. This visit type was conducted due to national recommendations for restrictions regarding the COVID-19 Pandemic (e.g. social distancing, sheltering in place) in an effort to limit this patient's exposure and mitigate transmission in our community. All issues noted in this document were discussed and addressed.  A physical exam was not performed with this format.  I connected with Jacob Sanders on 12/11/20 at 8 AM by telephone and verified that I am speaking with the correct person using two identifiers. Jacob Sanders is currently located at home during visit. The provider, Daryll Drown, NP is located in their office at time of visit.  I discussed the limitations, risks, security and privacy concerns of performing an evaluation and management service by telephone and the availability of in person appointments. I also discussed with the patient that there may be a patient responsible charge related to this service. The patient expressed understanding and agreed to proceed.   History and Present Illness:  Sinusitis This is a recurrent problem. The current episode started 1 to 4 weeks ago. The problem is unchanged. There has been no fever. The pain is moderate. Associated symptoms include congestion, coughing, ear pain and a sore throat. Pertinent negatives include no chills. Past treatments include antibiotics. The treatment provided mild relief.     Review of Systems  Constitutional:  Negative for chills and fever.  HENT:  Positive for congestion, ear pain, sinus pain and sore throat.   Respiratory:  Positive for cough.   Skin:  Negative for rash.  All other systems reviewed and are negative.   Observations/Objective: Televisit patient not in distress  Assessment and Plan: Take meds as prescribed - Use a cool mist humidifier  -Use saline nose sprays frequently -Force fluids -For fever or aches or  pains- take Tylenol or ibuprofen. -Azithromycin 250 mg tablet by mouth, 500 mg day 1 250 mg tablet day 2-5. -Education provided to patient, patient verbalized understanding. -Rx sent to pharmacy. -If symptoms do not improve, she may need to be COVID tested to rule this out Follow up with worsening unresolved symptoms   Follow Up Instructions: Follow-up with unresolved symptoms    I discussed the assessment and treatment plan with the patient. The patient was provided an opportunity to ask questions and all were answered. The patient agreed with the plan and demonstrated an understanding of the instructions.   The patient was advised to call back or seek an in-person evaluation if the symptoms worsen or if the condition fails to improve as anticipated.  The above assessment and management plan was discussed with the patient. The patient verbalized understanding of and has agreed to the management plan. Patient is aware to call the clinic if symptoms persist or worsen. Patient is aware when to return to the clinic for a follow-up visit. Patient educated on when it is appropriate to go to the emergency department.   Time call ended: 8:10 AM  I provided 10 minutes of  non face-to-face time during this encounter.    Daryll Drown, NP

## 2020-12-17 ENCOUNTER — Ambulatory Visit (INDEPENDENT_AMBULATORY_CARE_PROVIDER_SITE_OTHER): Payer: Medicare Other

## 2020-12-17 ENCOUNTER — Ambulatory Visit (INDEPENDENT_AMBULATORY_CARE_PROVIDER_SITE_OTHER): Payer: Medicare Other | Admitting: Nurse Practitioner

## 2020-12-17 ENCOUNTER — Encounter: Payer: Self-pay | Admitting: Nurse Practitioner

## 2020-12-17 VITALS — BP 149/98 | HR 84 | Temp 98.0°F | Ht 70.0 in | Wt 194.4 lb

## 2020-12-17 DIAGNOSIS — J069 Acute upper respiratory infection, unspecified: Secondary | ICD-10-CM

## 2020-12-17 DIAGNOSIS — Z0389 Encounter for observation for other suspected diseases and conditions ruled out: Secondary | ICD-10-CM | POA: Diagnosis not present

## 2020-12-17 IMAGING — DX DG CHEST 2V
3 series · 3 of 3 positions shown · non-contrast
Comparison: [DATE]

CLINICAL DATA: 75-year-old male with a history of decreased lower
lungs sounds

EXAM:
CHEST - 2 VIEW

[chest pa (1 of 2)]
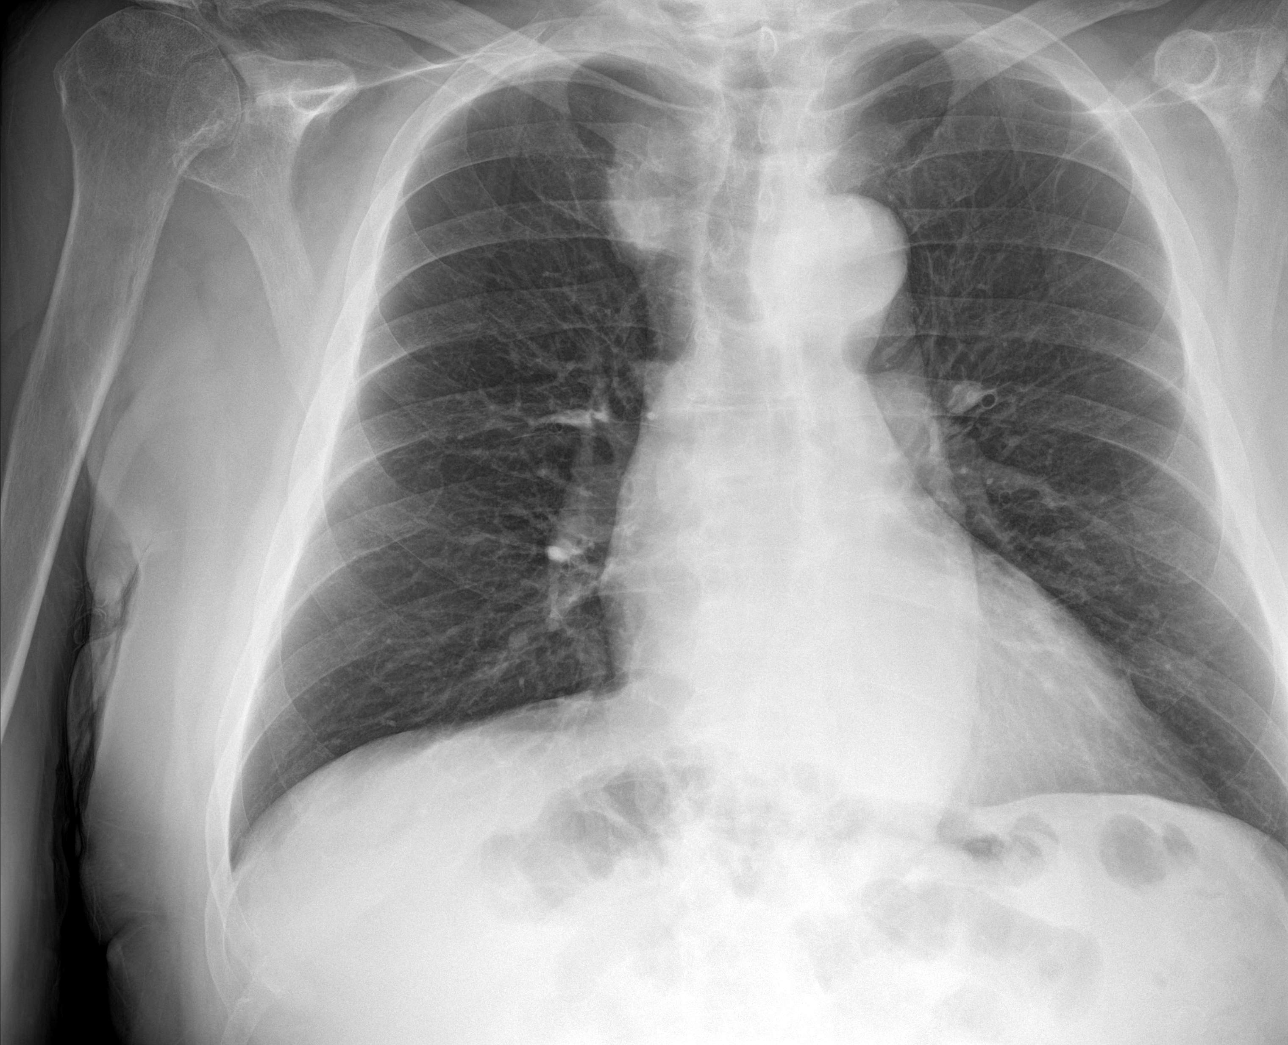

[chest lat]
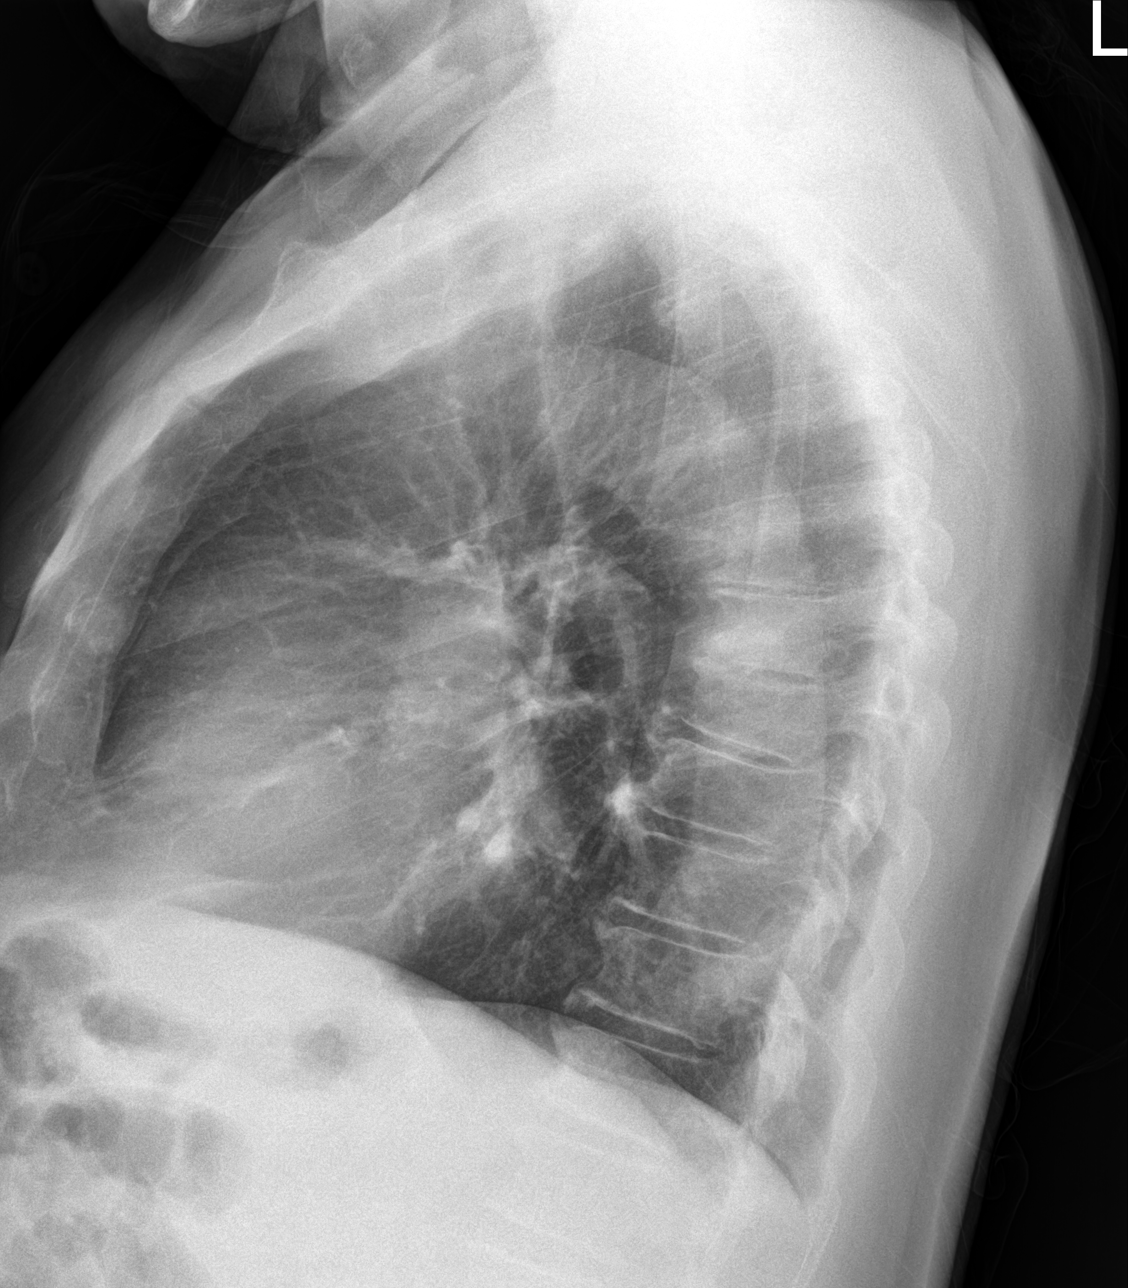

[chest pa (2 of 2)]
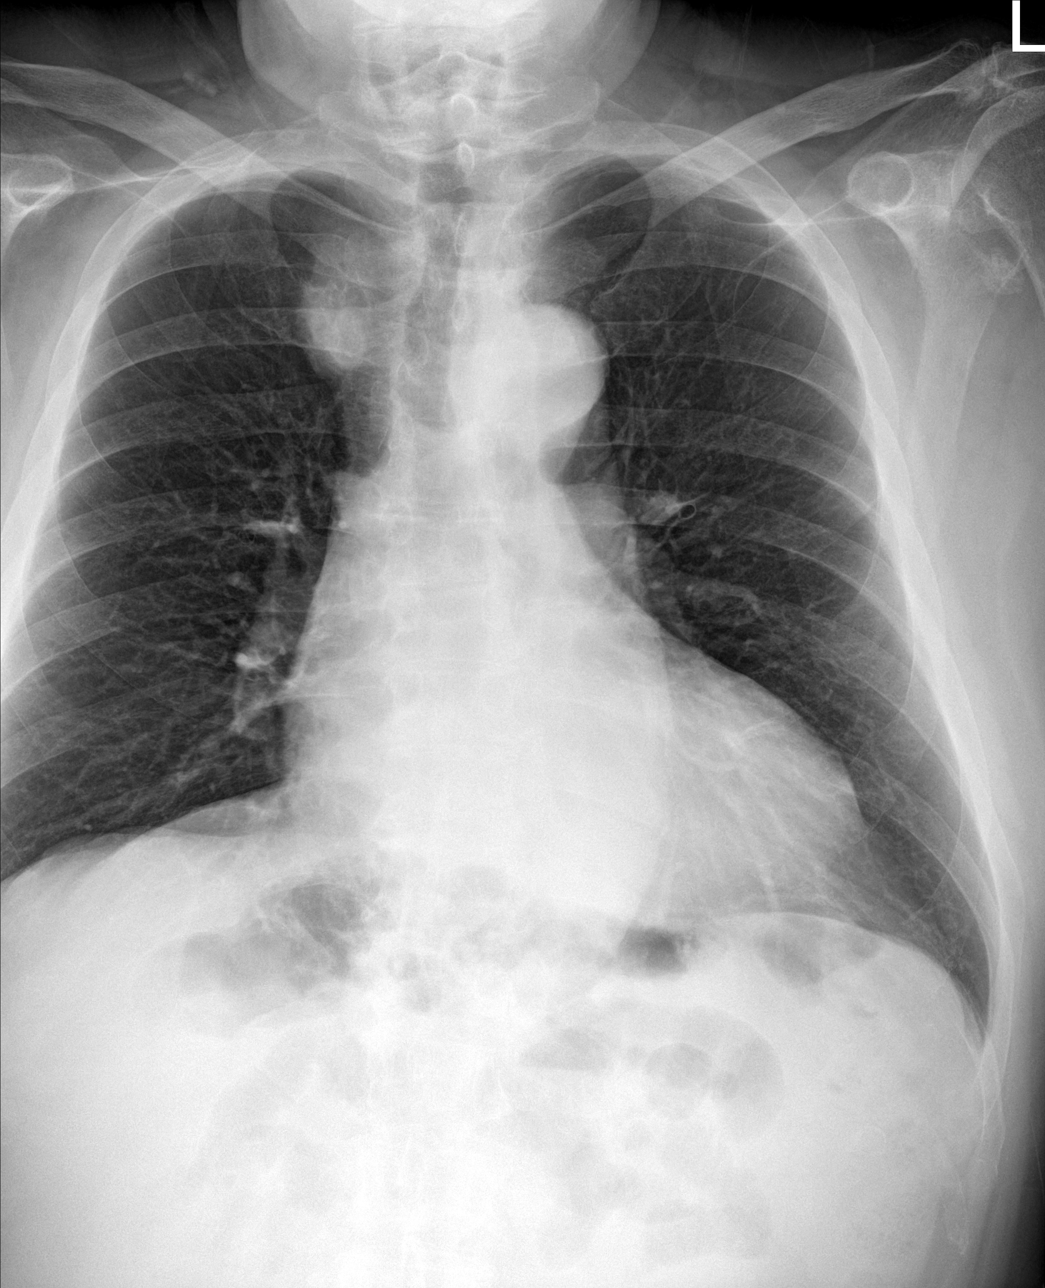

[3 of 3 positions shown; findings below may reference images not displayed]

FINDINGS: Cardiomediastinal silhouette unchanged in size and contour.
Redemonstration of prominent vasculature in the right suprahilar
region. No evidence of central vascular congestion. No interlobular
septal thickening.

No pneumothorax or pleural effusion. Coarsened interstitial
markings, with no confluent airspace disease.

No acute displaced fracture. Degenerative changes of the spine.
IMPRESSION: No active cardiopulmonary disease.

## 2020-12-17 MED ORDER — BENZONATATE 100 MG PO CAPS: 100.0000 mg | ORAL_CAPSULE | Freq: Two times a day (BID) | ORAL | 0 refills | Status: DC | PRN

## 2020-12-17 MED ORDER — DM-GUAIFENESIN ER 30-600 MG PO TB12
1.0000 | ORAL_TABLET | Freq: Two times a day (BID) | ORAL | 1 refills | Status: DC
Start: 2020-12-17 — End: 2021-03-26

## 2020-12-17 MED ORDER — AMOXICILLIN-POT CLAVULANATE 875-125 MG PO TABS: 1.0000 | ORAL_TABLET | Freq: Two times a day (BID) | ORAL | 0 refills | Status: DC

## 2020-12-17 NOTE — Assessment & Plan Note (Addendum)
Symptoms not well controlled.  Patient recently completed azithromycin for 5 days.  Patient felt better a few days but symptoms did not completely resolved.  Worsening cough and chest congestion today.  On assessment bilateral lower lung sounds decreased.  No fever associated with current symptoms.  Completed chest x-ray  Take meds as prescribed - Use a cool mist humidifier  -Use saline nose sprays frequently -Force fluids -For fever or aches or pains- take Tylenol or ibuprofen. -Guaifenesin for cough and congestion -Tessalon Perles for cough -Augmentin 875-125 mg tablet by mouth twice daily.  -If symptoms do not improve, she may need to be COVID tested to rule this out Follow up with worsening unresolved symptoms

## 2020-12-17 NOTE — Progress Notes (Signed)
 Acute Office Visit  Subjective:    Patient ID: Jacob Sanders, male    DOB: 04/20/1945, 75 y.o.   MRN: [id number]  Chief Complaint  Patient presents with   Sinusitis   Cough    Sinusitis This is a recurrent problem. The current episode started in the past 7 days. The problem has been gradually worsening since onset. There has been no fever. He is experiencing no pain. Associated symptoms include congestion and coughing. Pertinent negatives include no ear pain, shortness of breath or sore throat. Past treatments include antibiotics. The treatment provided mild relief.  Cough This is a recurrent problem. The current episode started in the past 7 days. The problem has been unchanged. The problem occurs constantly. The cough is Productive of sputum. Associated symptoms include nasal congestion. Pertinent negatives include no ear congestion, ear pain, hemoptysis, rash, sore throat or shortness of breath.   Past Medical History:  Diagnosis Date   Arthritis    Atrial fibrillation (HCC)    Dysrhythmia    afib   GERD (gastroesophageal reflux disease)    History of kidney stones    passed   Hyperlipidemia    Hypertension    Nerve damage    left hand after accident- 1995   Seasonal allergies    Staph infection    post back surgeries 1998 and1999    Past Surgical History:  Procedure Laterality Date   BACK SURGERY  1998,1999   discectomy   HAND SURGERY Left 1995   OPEN REDUCTION INTERNAL FIXATION (ORIF) DISTAL RADIAL FRACTURE Left 07/29/2019   Procedure: OPEN REDUCTION INTERNAL FIXATION (ORIF) LEFT DISTAL RADIAL FRACTURE;  Surgeon: Gramig, William, MD;  Location: [facility] OR;  Service: Orthopedics;  Laterality: Left;  90 mins   TONSILLECTOMY     childhood    TOOTH EXTRACTION  3 weeks ago ;   had abcess beneath tooth; experiened some bleeding for 20 hours post op   TOTAL HIP ARTHROPLASTY Left 05/22/2016   Procedure: LEFT TOTAL HIP ARTHROPLASTY ANTERIOR APPROACH;  Surgeon: Swinteck, Brian,  MD;  Location: [facility];  Service: Orthopedics;  Laterality: Left;  Dr. requesting RNFA   TOTAL HIP ARTHROPLASTY Right 07/17/2016   Procedure: RIGHT TOTAL HIP ARTHROPLASTY ANTERIOR APPROACH;  Surgeon: Swinteck, Brian, MD;  Location: [facility];  Service: Orthopedics;  Laterality: Right;  needs RNFA    Family History  Problem Relation Age of Onset   Hypertension Mother    Aneurysm Father    Hypertension Father     Social History   Socioeconomic History   Marital status: Married    Spouse name: Tresia   Number of children: 3   Years of education: 10   Highest education level: 10th grade  Occupational History   Occupation: Retired  Tobacco Use   Smoking status: Every Day    Packs/day: 1.00    Years: 45.00    Pack years: 45.00    Types: Cigars, Cigarettes   Smokeless tobacco: Never  Vaping Use   Vaping Use: Never used  Substance and Sexual Activity   Alcohol use: No   Drug use: No   Sexual activity: Not Currently  Other Topics Concern   Not on file  Social History Narrative   Not on file   Social Determinants of Health   Financial Resource Strain: Low Risk    Difficulty of Paying Living Expenses: Not hard at all  Food Insecurity: No Food Insecurity   Worried About Running Out of Food in the Last   Year: Never true   Ran Out of Food in the Last Year: Never true  Transportation Needs: No Transportation Needs   Lack of Transportation (Medical): No   Lack of Transportation (Non-Medical): No  Physical Activity: Insufficiently Active   Days of Exercise per Week: 7 days   Minutes of Exercise per Session: 10 min  Stress: No Stress Concern Present   Feeling of Stress : Not at all  Social Connections: Socially Integrated   Frequency of Communication with Friends and Family: More than three times a week   Frequency of Social Gatherings with Friends and Family: Once a week   Attends Religious Services: More than 4 times per year   Active Member of Clubs or Organizations: Yes    Attends Club or Organization Meetings: More than 4 times per year   Marital Status: Married  Intimate Partner Violence: Not At Risk   Fear of Current or Ex-Partner: No   Emotionally Abused: No   Physically Abused: No   Sexually Abused: No    Outpatient Medications Prior to Visit  Medication Sig Dispense Refill   aspirin EC 81 MG EC tablet Take 1 tablet (81 mg total) by mouth daily. 90 tablet 3   busPIRone (BUSPAR) 5 MG tablet Take 1 tablet (5 mg total) by mouth 2 (two) times daily. 90 tablet 1   diltiazem (CARDIZEM CD) 300 MG 24 hr capsule Take 1 capsule (300 mg total) by mouth daily. 90 capsule 1   meclizine (ANTIVERT) 25 MG tablet TAKE 1 TABLET BY MOUTH 3  TIMES DAILY AS NEEDED FOR  DIZZINESS 180 tablet 0   omeprazole (PRILOSEC) 20 MG capsule TAKE 1 CAPSULE BY MOUTH  DAILY AS NEEDED 90 capsule 0   sodium chloride (OCEAN) 0.65 % SOLN nasal spray Place 1 spray into both nostrils as needed for congestion. 60 mL 0   amoxicillin-clavulanate (AUGMENTIN) 875-125 MG tablet Take 1 tablet by mouth 2 (two) times daily. 14 tablet 0   No facility-administered medications prior to visit.    Allergies  Allergen Reactions   Warfarin And Related Other (See Comments)    Bleeding.   Prednisone Swelling    Review of Systems  HENT:  Positive for congestion. Negative for ear pain and sore throat.   Eyes: Negative.   Respiratory:  Positive for cough. Negative for hemoptysis and shortness of breath.   Gastrointestinal: Negative.   Musculoskeletal: Negative.   Skin:  Negative for rash.  All other systems reviewed and are negative.     Objective:    Physical Exam Vitals and nursing note reviewed.  Constitutional:      Appearance: Normal appearance.  HENT:     Head: Normocephalic.     Right Ear: External ear normal.     Left Ear: External ear normal.     Nose: Congestion present.     Mouth/Throat:     Mouth: Mucous membranes are moist.  Eyes:     Conjunctiva/sclera: Conjunctivae normal.   Cardiovascular:     Rate and Rhythm: Normal rate and regular rhythm.     Pulses: Normal pulses.     Heart sounds: Normal heart sounds.  Pulmonary:     Effort: Pulmonary effort is normal.     Breath sounds: Examination of the right-lower field reveals decreased breath sounds. Examination of the left-lower field reveals decreased breath sounds. Decreased breath sounds present.  Abdominal:     General: Bowel sounds are normal.  Skin:    General: Skin is warm.       Findings: No rash.  Neurological:     Mental Status: He is alert and oriented to person, place, and time.    BP (!) 149/98   Pulse 84   Temp 98 F (36.7 C) (Temporal)   Ht 5' 10" (1.778 m)   Wt 194 lb 6.4 oz (88.2 kg)   SpO2 97%   BMI 27.89 kg/m  Wt Readings from Last 3 Encounters:  12/17/20 194 lb 6.4 oz (88.2 kg)  11/27/20 204 lb 12.8 oz (92.9 kg)  10/03/20 197 lb (89.4 kg)    Health Maintenance Due  Topic Date Due   COVID-19 Vaccine (1) Never done    There are no preventive care reminders to display for this patient.   Lab Results  Component Value Date   TSH 2.090 05/03/2020   Lab Results  Component Value Date   WBC 10.1 11/19/2020   HGB 16.0 11/19/2020   HCT 49.1 11/19/2020   MCV 91.6 11/19/2020   PLT 254 11/19/2020   Lab Results  Component Value Date   NA 139 11/19/2020   K 3.9 11/19/2020   CO2 24 11/19/2020   GLUCOSE 98 11/19/2020   BUN 10 11/19/2020   CREATININE 0.94 11/19/2020   BILITOT 0.7 05/03/2020   ALKPHOS 95 05/03/2020   AST 12 05/03/2020   ALT 10 05/03/2020   PROT 7.2 05/03/2020   ALBUMIN 4.4 05/03/2020   CALCIUM 8.8 (L) 11/19/2020   ANIONGAP 9 11/19/2020   EGFR 87 05/03/2020   Lab Results  Component Value Date   CHOL 191 05/03/2020   Lab Results  Component Value Date   HDL 53 05/03/2020   Lab Results  Component Value Date   LDLCALC 123 (H) 05/03/2020   Lab Results  Component Value Date   TRIG 80 05/03/2020   Lab Results  Component Value Date   CHOLHDL 3.6  05/03/2020   Lab Results  Component Value Date   HGBA1C 5.5 03/21/2016       Assessment & Plan:   Problem List Items Addressed This Visit       Respiratory   Upper respiratory tract infection - Primary    Symptoms not well controlled.  Patient recently completed azithromycin for 5 days.  Patient felt better a few days but symptoms did not completely resolved.  Worsening cough and chest congestion today.  On assessment bilateral lower lung sounds decreased.  No fever associated with current symptoms.  Completed chest x-ray  Take meds as prescribed - Use a cool mist humidifier  -Use saline nose sprays frequently -Force fluids -For fever or aches or pains- take Tylenol or ibuprofen. -Guaifenesin for cough and congestion -Tessalon Perles for cough -Augmentin 875-125 mg tablet by mouth twice daily.  -If symptoms do not improve, she may need to be COVID tested to rule this out Follow up with worsening unresolved symptoms      Relevant Medications   dextromethorphan-guaiFENesin (MUCINEX DM) 30-600 MG 12hr tablet   amoxicillin-clavulanate (AUGMENTIN) 875-125 MG tablet   benzonatate (TESSALON) 100 MG capsule   Other Relevant Orders   DG Chest 2 View     Meds ordered this encounter  Medications   dextromethorphan-guaiFENesin (MUCINEX DM) 30-600 MG 12hr tablet    Sig: Take 1 tablet by mouth 2 (two) times daily.    Dispense:  30 tablet    Refill:  1    Order Specific Question:   Supervising Provider    Answer:   Claretta Fraise [[id number]]   amoxicillin-clavulanate (AUGMENTIN) 875-125 MG  tablet    Sig: Take 1 tablet by mouth 2 (two) times daily.    Dispense:  14 tablet    Refill:  0    Order Specific Question:   Supervising Provider    Answer:   Claretta Fraise [[id number]]   benzonatate (TESSALON) 100 MG capsule    Sig: Take 1 capsule (100 mg total) by mouth 2 (two) times daily as needed for cough.    Dispense:  20 capsule    Refill:  0    Order Specific Question:   Supervising  Provider    Answer:   Claretta Fraise [[id number]]     Ivy Lynn, NP

## 2020-12-17 NOTE — Patient Instructions (Addendum)
Cough, Adult °A cough helps to clear your throat and lungs. A cough may be a sign of an illness or another medical condition. °An acute cough may only last 2-3 weeks, while a chronic cough may last 8 or more weeks. °Many things can cause a cough. They include: °Germs (viruses or bacteria) that attack the airway. °Breathing in things that bother (irritate) your lungs. °Allergies. °Asthma. °Mucus that runs down the back of your throat (postnasal drip). °Smoking. °Acid backing up from the stomach into the tube that moves food from the mouth to the stomach (gastroesophageal reflux). °Some medicines. °Lung problems. °Other medical conditions, such as heart failure or a blood clot in the lung (pulmonary embolism). °Follow these instructions at home: °Medicines °Take over-the-counter and prescription medicines only as told by your doctor. °Talk with your doctor before you take medicines that stop a cough (cough suppressants). °Lifestyle ° °Do not smoke, and try not to be around smoke. Do not use any products that contain nicotine or tobacco, such as cigarettes, e-cigarettes, and chewing tobacco. If you need help quitting, ask your doctor. °Drink enough fluid to keep your pee (urine) pale yellow. °Avoid caffeine. °Do not drink alcohol if your doctor tells you not to drink. °General instructions ° °Watch for any changes in your cough. Tell your doctor about them. °Always cover your mouth when you cough. °Stay away from things that make you cough, such as perfume, candles, campfire smoke, or cleaning products. °If the air is dry, use a cool mist vaporizer or humidifier in your home. °If your cough is worse at night, try using extra pillows to raise your head up higher while you sleep. °Rest as needed. °Keep all follow-up visits as told by your doctor. This is important. °Contact a doctor if: °You have new symptoms. °You cough up pus. °Your cough does not get better after 2-3 weeks, or your cough gets worse. °Cough medicine  does not help your cough and you are not sleeping well. °You have pain that gets worse or pain that is not helped with medicine. °You have a fever. °You are losing weight and you do not know why. °You have night sweats. °Get help right away if: °You cough up blood. °You have trouble breathing. °Your heartbeat is very fast. °These symptoms may be an emergency. Do not wait to see if the symptoms will go away. Get medical help right away. Call your local emergency services (911 in the [place] Do not drive yourself to the hospital. °Summary °A cough helps to clear your throat and lungs. Many things can cause a cough. °Take over-the-counter and prescription medicines only as told by your doctor. °Always cover your mouth when you cough. °Contact a doctor if you have new symptoms or you have a cough that does not get better or gets worse. °This information is not intended to replace advice given to you by your health care provider. Make sure you discuss any questions you have with your health care provider. °Document Revised: 02/18/2019 Document Reviewed: 01/18/2018 °Elsevier Patient Education © 2022 [employer] °Sinusitis, Adult °Sinusitis is soreness and swelling (inflammation) of your sinuses. Sinuses are hollow spaces in the bones around your face. They are located: °Around your eyes. °In the middle of your forehead. °Behind your nose. °In your cheekbones. °Your sinuses and nasal passages are lined with a fluid called mucus. Mucus drains out of your sinuses. Swelling can trap mucus in your sinuses. This lets germs (bacteria, virus, or fungus) grow, which leads   to infection. Most of the time, this condition is caused by a virus. °What are the causes? °This condition is caused by: °Allergies. °Asthma. °Germs. °Things that block your nose or sinuses. °Growths in the nose (nasal polyps). °Chemicals or irritants in the air. °Fungus (rare). °What increases the risk? °You are more likely to develop this condition if: °You  have a weak body defense system (immune system). °You do a lot of swimming or diving. °You use nasal sprays too much. °You smoke. °What are the signs or symptoms? °The main symptoms of this condition are pain and a feeling of pressure around the sinuses. Other symptoms include: °Stuffy nose (congestion). °Runny nose (drainage). °Swelling and warmth in the sinuses. °Headache. °Toothache. °A cough that may get worse at night. °Mucus that collects in the throat or the back of the nose (postnasal drip). °Being unable to smell and taste. °Being very tired (fatigue). °A fever. °Sore throat. °Bad breath. °How is this diagnosed? °This condition is diagnosed based on: °Your symptoms. °Your medical history. °A physical exam. °Tests to find out if your condition is short-term (acute) or long-term (chronic). Your doctor may: °Check your nose for growths (polyps). °Check your sinuses using a tool that has a light (endoscope). °Check for allergies or germs. °Do imaging tests, such as an MRI or CT scan. °How is this treated? °Treatment for this condition depends on the cause and whether it is short-term or long-term. °If caused by a virus, your symptoms should go away on their own within 10 days. You may be given medicines to relieve symptoms. They include: °Medicines that shrink swollen tissue in the nose. °Medicines that treat allergies (antihistamines). °A spray that treats swelling of the nostrils.  °Rinses that help get rid of thick mucus in your nose (nasal saline washes). °If caused by bacteria, your doctor may wait to see if you will get better without treatment. You may be given antibiotic medicine if you have: °A very bad infection. °A weak body defense system. °If caused by growths in the nose, you may need to have surgery. °Follow these instructions at home: °Medicines °Take, use, or apply over-the-counter and prescription medicines only as told by your doctor. These may include nasal sprays. °If you were prescribed an  antibiotic medicine, take it as told by your doctor. Do not stop taking the antibiotic even if you start to feel better. °Hydrate and humidify ° °Drink enough water to keep your pee (urine) pale yellow. °Use a cool mist humidifier to keep the humidity level in your home above 50%. °Breathe in steam for 10-15 minutes, 3-4 times a day, or as told by your doctor. You can do this in the bathroom while a hot shower is running. °Try not to spend time in cool or dry air. °Rest °Rest as much as you can. °Sleep with your head raised (elevated). °Make sure you get enough sleep each night. °General instructions ° °Put a warm, moist washcloth on your face 3-4 times a day, or as often as told by your doctor. This will help with discomfort. °Wash your hands often with soap and water. If there is no soap and water, use hand sanitizer. °Do not smoke. Avoid being around people who are smoking (secondhand smoke). °Keep all follow-up visits as told by your doctor. This is important. °Contact a doctor if: °You have a fever. °Your symptoms get worse. °Your symptoms do not get better within 10 days. °Get help right away if: °You have a very bad   headache. °You cannot stop throwing up (vomiting). °You have very bad pain or swelling around your face or eyes. °You have trouble seeing. °You feel confused. °Your neck is stiff. °You have trouble breathing. °Summary °Sinusitis is swelling of your sinuses. Sinuses are hollow spaces in the bones around your face. °This condition is caused by tissues in your nose that become inflamed or swollen. This traps germs. These can lead to infection. °If you were prescribed an antibiotic medicine, take it as told by your doctor. Do not stop taking it even if you start to feel better. °Keep all follow-up visits as told by your doctor. This is important. °This information is not intended to replace advice given to you by your health care provider. Make sure you discuss any questions you have with your health  care provider. °Document Revised: 06/01/2017 Document Reviewed: 06/01/2017 °Elsevier Patient Education © 2022 [employer] ° °

## 2020-12-18 ENCOUNTER — Ambulatory Visit: Payer: Medicare Other | Admitting: Cardiovascular Disease

## 2020-12-26 ENCOUNTER — Ambulatory Visit (INDEPENDENT_AMBULATORY_CARE_PROVIDER_SITE_OTHER): Payer: Medicare Other | Admitting: Family

## 2020-12-26 ENCOUNTER — Encounter: Payer: Self-pay | Admitting: Family

## 2020-12-26 VITALS — BP 152/96 | HR 76 | Temp 98.0°F | Ht 70.0 in | Wt 197.8 lb

## 2020-12-26 DIAGNOSIS — F411 Generalized anxiety disorder: Secondary | ICD-10-CM | POA: Diagnosis not present

## 2020-12-26 DIAGNOSIS — J441 Chronic obstructive pulmonary disease with (acute) exacerbation: Secondary | ICD-10-CM

## 2020-12-26 DIAGNOSIS — I10 Essential (primary) hypertension: Secondary | ICD-10-CM | POA: Diagnosis not present

## 2020-12-26 DIAGNOSIS — F17200 Nicotine dependence, unspecified, uncomplicated: Secondary | ICD-10-CM

## 2020-12-26 MED ORDER — DOXYCYCLINE HYCLATE 100 MG PO TABS
100.0000 mg | ORAL_TABLET | Freq: Two times a day (BID) | ORAL | 0 refills | Status: DC
Start: 2020-12-26 — End: 2021-01-08

## 2020-12-26 NOTE — Progress Notes (Signed)
Subjective:    Patient ID: Jacob Sanders, male    DOB: July 13, 1945, 75 y.o.   MRN: [id number]  Chief Complaint  Patient presents with   Hypertension   Pt presents to the office today to recheck HTN. He was seen on 11/27/20 and we increased his Cardizem to 300 mg. His BP at home has been "good". States he is unsure of his readings because his wife takes it.   He continues to have a cough. He has completed Zpak and Augmentin with mild relief. He had a negative chest x-ray.   We also started him on Buspar 5 mg TID prn for anxiety. States his anxiety has been doing well.  Hypertension This is a chronic problem. The current episode started more than 1 year ago. The problem has been waxing and waning since onset. The problem is uncontrolled. Associated symptoms include anxiety and malaise/fatigue. Pertinent negatives include no headaches, peripheral edema or shortness of breath. Risk factors for coronary artery disease include obesity and male gender. The current treatment provides moderate improvement.  Anxiety Presents for follow-up visit. Symptoms include excessive worry, irritability, nervous/anxious behavior and restlessness. Patient reports no shortness of breath.    Cough This is a new problem. The current episode started more than 1 month ago. The problem has been waxing and waning. The problem occurs every few minutes. The cough is Productive of sputum. Associated symptoms include ear congestion, ear pain, myalgias, nasal congestion and wheezing. Pertinent negatives include no fever, headaches, shortness of breath or weight loss. Risk factors for lung disease include smoking/tobacco exposure. He has tried rest for the symptoms. The treatment provided mild relief. His past medical history is significant for COPD and emphysema.     Review of Systems  Constitutional:  Positive for irritability and malaise/fatigue. Negative for fever and weight loss.  HENT:  Positive for ear pain.    Respiratory:  Positive for cough and wheezing. Negative for shortness of breath.   Musculoskeletal:  Positive for myalgias.  Neurological:  Negative for headaches.  Psychiatric/Behavioral:  The patient is nervous/anxious.   All other systems reviewed and are negative.     Objective:   Physical Exam Vitals reviewed.  Constitutional:      General: He is not in acute distress.    Appearance: He is well-developed. He is obese.  HENT:     Head: Normocephalic.  Eyes:     General:        Right eye: No discharge.        Left eye: No discharge.     Pupils: Pupils are equal, round, and reactive to light.  Neck:     Thyroid: No thyromegaly.  Cardiovascular:     Rate and Rhythm: Normal rate and regular rhythm.     Heart sounds: Normal heart sounds. No murmur heard. Pulmonary:     Effort: Pulmonary effort is normal. No respiratory distress.     Breath sounds: Rhonchi present. No wheezing.  Abdominal:     General: Bowel sounds are normal. There is no distension.     Palpations: Abdomen is soft.     Tenderness: There is no abdominal tenderness.  Musculoskeletal:        General: No tenderness. Normal range of motion.     Cervical back: Normal range of motion and neck supple.  Skin:    General: Skin is warm and dry.     Findings: No erythema or rash.  Neurological:     Mental Status: He  is alert and oriented to person, place, and time.     Cranial Nerves: No cranial nerve deficit.     Deep Tendon Reflexes: Reflexes are normal and symmetric.  Psychiatric:        Behavior: Behavior normal.        Thought Content: Thought content normal.        Judgment: Judgment normal.      BP (!) 154/95    Pulse 76    Temp 98 F (36.7 C) (Temporal)    Ht 5\' 10"  (1.778 m)    Wt 197 lb 12.8 oz (89.7 kg)    BMI 28.38 kg/m      Assessment & Plan:  Jacob Sanders comes in today with chief complaint of Hypertension   Diagnosis and orders addressed:  1. Primary hypertension States stable at  home Continue current medications   2. GAD (generalized anxiety disorder) Buspar as needed   3. Current smoker - doxycycline (VIBRA-TABS) 100 MG tablet; Take 1 tablet (100 mg total) by mouth 2 (two) times daily.  Dispense: 20 tablet; Refill: 0  4. COPD exacerbation ([place]) Start doxycycline  - Take meds as prescribed - Use a cool mist humidifier  -Use saline nose sprays frequently -Force fluids -For any cough or congestion  Use plain Mucinex- regular strength or max strength is fine -For fever or aces or pains- take tylenol or ibuprofen. -Throat lozenges if help -Follow up if symptoms worsen or do not improve  - doxycycline (VIBRA-TABS) 100 MG tablet; Take 1 tablet (100 mg total) by mouth 2 (two) times daily.  Dispense: 20 tablet; Refill: 0   Health Maintenance reviewed Diet and exercise encouraged  Follow up plan: 3 months    Evelina Dun, FNP

## 2020-12-26 NOTE — Patient Instructions (Signed)

## 2020-12-27 ENCOUNTER — Ambulatory Visit: Payer: Medicare Other | Admitting: Family

## 2020-12-31 ENCOUNTER — Ambulatory Visit (INDEPENDENT_AMBULATORY_CARE_PROVIDER_SITE_OTHER): Payer: Medicare Other | Admitting: Family

## 2020-12-31 ENCOUNTER — Encounter: Payer: Self-pay | Admitting: Family

## 2020-12-31 VITALS — BP 154/93 | HR 92 | Temp 97.9°F | Ht 70.0 in | Wt 195.4 lb

## 2020-12-31 DIAGNOSIS — J441 Chronic obstructive pulmonary disease with (acute) exacerbation: Secondary | ICD-10-CM

## 2020-12-31 DIAGNOSIS — F17200 Nicotine dependence, unspecified, uncomplicated: Secondary | ICD-10-CM

## 2020-12-31 MED ORDER — BREZTRI AEROSPHERE 160-9-4.8 MCG/ACT IN AERO: 2.0000 | INHALATION_SPRAY | Freq: Two times a day (BID) | RESPIRATORY_TRACT | 11 refills | Status: DC

## 2020-12-31 MED ORDER — METHYLPREDNISOLONE 4 MG PO TBPK: ORAL_TABLET | ORAL | 0 refills | Status: DC

## 2020-12-31 NOTE — Patient Instructions (Signed)
Chronic Obstructive Pulmonary Disease °Chronic obstructive pulmonary disease (COPD) is a long-term (chronic) condition that affects the lungs. COPD is a general term that can be used to describe many different lung problems that cause lung inflammation and limit airflow, including chronic bronchitis and emphysema. °If you have COPD, your lung function will probably never return to normal. In most cases, it gets worse over time. However, there are steps you can take to slow the progression of the disease and improve your quality of life. °What are the causes? °This condition may be caused by: °Smoking. This is the most common cause. °Certain genes passed down through families. °What increases the risk? °The following factors may make you more likely to develop this condition: °Being exposed to secondhand smoke from cigarettes, pipes, or cigars. °Being exposed to chemicals and other irritants, such as fumes and dust in the work environment. °Having chronic lung conditions or infections. °What are the signs or symptoms? °Symptoms of this condition include: °Shortness of breath, especially during physical activity. °Chronic cough with a large amount of thick mucus. Sometimes, the cough may not have any mucus (dry cough). °Wheezing and rapid breathing. °Gray or bluish discoloration (cyanosis) of the skin, especially in the fingers, toes, or lips. °Feeling tired (fatigue). °Weight loss. °Chest tightness. °Frequent infections. °Episodes when breathing symptoms become much worse (exacerbations). °At the later stages of this disease, you may have swelling in the ankles, feet, or legs. °How is this diagnosed? °This condition is diagnosed based on: °Your medical history. °A physical exam. °You may also have tests, including: °Lung (pulmonary) function tests. This may include a spirometry test, which measures your ability to exhale properly. °Chest X-ray. °CT scan. °Blood tests. °How is this treated? °This condition may be  treated with: °Medicines. These may include inhaled rescue medicines to treat acute exacerbations as well as medicines that you take long-term (maintenance medicines) to prevent flare-ups of COPD. °Bronchodilators help treat COPD by dilating the airways to allow increased airflow and make your breathing more comfortable. °Steroids can reduce airway inflammation and help prevent exacerbations. °Smoking cessation. If you smoke, your health care provider may ask you to quit, and may also recommend therapy or replacement products to help you quit. °Pulmonary rehabilitation. This may involve working with a team of health care providers and specialists, such as respiratory, occupational, and physical therapists. °Exercise and physical activity. These are beneficial for nearly all people with COPD. °Nutrition therapy to gain weight, if you are underweight. °Oxygen. Supplemental oxygen therapy is only helpful if you have a low oxygen level in your blood (hypoxemia). °Lung surgery or transplant. °Palliative care. This is to help people with COPD feel comfortable when treatment is no longer working. °Follow these instructions at home: °Medicines °Take over-the-counter and prescription medicines only as told by your health care provider. This includes inhaled medicines and pills. °Talk to your health care provider before taking any cough or allergy medicines. You may need to avoid certain medicines that dry out your airways. °Lifestyle °If you smoke, the most important thing that you can do is to stop smoking. Continuing to smoke will cause the disease to progress faster. °Do not use any products that contain nicotine or tobacco. These products include cigarettes, chewing tobacco, and vaping devices, such as e-cigarettes. If you need help quitting, ask your health care provider. °Avoid exposure to things that irritate your lungs, such as smoke, chemicals, and fumes. °Stay active, but balance activity with periods of rest.  Exercise and physical   activity will help you maintain your ability to do things you want to do. °Learn and use relaxation techniques to manage stress and to control your breathing. °Get the right amount of sleep and get quality sleep. Most adults need 7 or more hours per night. °Eat healthy foods. Eating smaller, more frequent meals and resting before meals may help you maintain your strength. °Controlled breathing °Learn and use controlled breathing techniques as directed by your health care provider. Controlled breathing techniques include: °Pursed lip breathing. Start by breathing in (inhaling) through your nose for 1 second. Then, purse your lips as if you were going to whistle and breathe out (exhale) through the pursed lips for 2 seconds. °Diaphragmatic breathing. Start by putting one hand on your abdomen just above your waist. Inhale slowly through your nose. The hand on your abdomen should move out. Then purse your lips and exhale slowly. You should be able to feel the hand on your abdomen moving in as you exhale. ° °Controlled coughing °Learn and use controlled coughing to clear mucus from your lungs. Controlled coughing is a series of short, progressive coughs. The steps of controlled coughing are: °Lean your head slightly forward. °Breathe in deeply using diaphragmatic breathing. °Try to hold your breath for 3 seconds. °Keep your mouth slightly open while coughing twice. °Spit any mucus out into a tissue. °Rest and repeat the steps once or twice as needed. °General instructions °Make sure you receive all the vaccines that your health care provider recommends, especially the pneumococcal and influenza vaccines. Preventing infection and hospitalization is very important when you have COPD. °Drink enough fluid to keep your urine pale yellow, unless you have a medical condition that requires fluid restriction. °Use oxygen therapy and pulmonary rehabilitation if told by your health care provider. If you  require home oxygen therapy, ask your health care provider whether you should purchase a pulse oximeter to measure your oxygen level at home. °Work with your health care provider to develop a COPD action plan. This will help you know what steps to take if your condition gets worse. °Keep other chronic health conditions under control as told by your health care provider. °Avoid extreme temperature and humidity changes. °Avoid contact with people who have an illness that spreads from person to person (is contagious), such as viral infections or pneumonia. °Keep all follow-up visits. This is important. °Contact a health care provider if: °You are coughing up more mucus than usual. °There is a change in the color or thickness of your mucus. °Your breathing is more labored than usual. °Your breathing is faster than usual. °You have difficulty sleeping. °You need to use your rescue medicines or inhalers more often than expected. °You have trouble doing routine activities such as getting dressed or walking around the house. °Get help right away if: °You have shortness of breath while you are resting. °You have shortness of breath that prevents you from: °Being able to talk. °Performing your usual physical activities. °You have chest pain lasting longer than 5 minutes. °Your skin color is more blue (cyanotic) than usual. °You measure low oxygen saturations for longer than 5 minutes with a pulse oximeter. °You have a fever. °You feel too tired to breathe normally. °These symptoms may represent a serious problem that is an emergency. Do not wait to see if the symptoms will go away. Get medical help right away. Call your local emergency services (911 in the [place] Do not drive yourself to the hospital. °Summary °Chronic obstructive pulmonary   disease (COPD) is a long-term (chronic) condition that affects the lungs. °Your lung function will probably never return to normal. In most cases, it gets worse over time. However, there  are steps you can take to slow the progression of the disease and improve your quality of life. °Treatment for COPD may include taking medicines, quitting smoking, pulmonary rehabilitation, and changes to diet and exercise. As the disease progresses, you may need oxygen therapy, a lung transplant, or palliative care. °To help manage your condition, do not smoke, avoid exposure to things that irritate your lungs, stay up to date on all vaccines, and follow your health care provider's instructions for taking medicines. °This information is not intended to replace advice given to you by your health care provider. Make sure you discuss any questions you have with your health care provider. °Document Revised: 11/08/2019 Document Reviewed: 11/08/2019 °Elsevier Patient Education © 2022 [employer] ° °

## 2020-12-31 NOTE — Progress Notes (Signed)
Subjective:    Patient ID: Jacob Sanders, male    DOB: 29-Jan-1945, 75 y.o.   MRN: [id number]  Chief Complaint  Patient presents with   Follow-up   Cough   Nasal Congestion   Dizziness   Blurred Vision   Pt presents to the office today with recurrent cough that started over a month ago. He has completed Augmentin and Zpak. He is on day 5 of doxycyline.   He states he can not tolerate prednisone.  Cough This is a recurrent problem. The current episode started 1 to 4 weeks ago. The problem has been waxing and waning. The problem occurs every few minutes. The cough is Productive of sputum. Associated symptoms include chills, headaches, nasal congestion, shortness of breath and wheezing. Pertinent negatives include no ear congestion or ear pain. Risk factors for lung disease include smoking/tobacco exposure. Treatments tried: antibiotics. The treatment provided mild relief.  Dizziness Associated symptoms include chills, coughing and headaches.     Review of Systems  Constitutional:  Positive for chills.  HENT:  Negative for ear pain.   Respiratory:  Positive for cough, shortness of breath and wheezing.   Neurological:  Positive for dizziness and headaches.  All other systems reviewed and are negative.     Objective:   Physical Exam Vitals reviewed.  Constitutional:      General: He is not in acute distress.    Appearance: He is well-developed.  HENT:     Head: Normocephalic.  Eyes:     General:        Right eye: No discharge.        Left eye: No discharge.     Pupils: Pupils are equal, round, and reactive to light.  Neck:     Thyroid: No thyromegaly.  Cardiovascular:     Rate and Rhythm: Normal rate and regular rhythm.     Heart sounds: Normal heart sounds. No murmur heard. Pulmonary:     Effort: Pulmonary effort is normal. No respiratory distress.     Breath sounds: Wheezing and rhonchi present.  Abdominal:     General: Bowel sounds are normal. There is no  distension.     Palpations: Abdomen is soft.     Tenderness: There is no abdominal tenderness.  Musculoskeletal:        General: No tenderness. Normal range of motion.     Cervical back: Normal range of motion and neck supple.  Skin:    General: Skin is warm and dry.     Findings: No erythema or rash.  Neurological:     Mental Status: He is alert and oriented to person, place, and time.     Cranial Nerves: No cranial nerve deficit.     Deep Tendon Reflexes: Reflexes are normal and symmetric.  Psychiatric:        Behavior: Behavior normal.        Thought Content: Thought content normal.        Judgment: Judgment normal.     BP (!) 154/93    Pulse 92    Temp 97.9 F (36.6 C) (Temporal)    Ht 5\' 10"  (1.778 m)    Wt 195 lb 6.4 oz (88.6 kg)    SpO2 97%    BMI 28.04 kg/m       Assessment & Plan:  CHUCK CABAN comes in today with chief complaint of Follow-up, Cough, Nasal Congestion, Dizziness, and Blurred Vision   Diagnosis and orders addressed:  1. COPD exacerbation (  HCC) - Take meds as prescribed - Use a cool mist humidifier  -Use saline nose sprays frequently -Force fluids -For any cough or congestion  Use plain Mucinex- regular strength or max strength is fine -For fever or aces or pains- take tylenol or ibuprofen. -Throat lozenges if help -Start Breztri daily Continue doxycycline Start Medrol dose pack  - methylPREDNISolone (MEDROL DOSEPAK) 4 MG TBPK tablet; Use as directed  Dispense: 21 tablet; Refill: 0 - Budeson-Glycopyrrol-Formoterol (BREZTRI AEROSPHERE) 160-9-4.8 MCG/ACT AERO; Inhale 2 puffs into the lungs 2 (two) times daily.  Dispense: 10.7 g; Refill: 11    Jannifer Rodney, FNP

## 2021-01-03 ENCOUNTER — Other Ambulatory Visit: Payer: Self-pay | Admitting: Family

## 2021-01-03 DIAGNOSIS — F411 Generalized anxiety disorder: Secondary | ICD-10-CM

## 2021-01-08 ENCOUNTER — Encounter: Payer: Self-pay | Admitting: Family

## 2021-01-08 ENCOUNTER — Ambulatory Visit (HOSPITAL_COMMUNITY)
Admission: RE | Admit: 2021-01-08 | Discharge: 2021-01-08 | Disposition: A | Discharge status: Home / Self Care | Payer: Medicare Other | Source: Ambulatory Visit | Attending: Family | Admitting: Family

## 2021-01-08 ENCOUNTER — Ambulatory Visit (INDEPENDENT_AMBULATORY_CARE_PROVIDER_SITE_OTHER): Payer: Medicare Other | Admitting: Family

## 2021-01-08 ENCOUNTER — Other Ambulatory Visit: Payer: Self-pay

## 2021-01-08 VITALS — BP 137/91 | HR 70 | Temp 97.9°F | Ht 70.0 in | Wt 191.0 lb

## 2021-01-08 DIAGNOSIS — R052 Subacute cough: Secondary | ICD-10-CM | POA: Diagnosis not present

## 2021-01-08 DIAGNOSIS — R42 Dizziness and giddiness: Secondary | ICD-10-CM | POA: Diagnosis not present

## 2021-01-08 DIAGNOSIS — H53453 Other localized visual field defect, bilateral: Secondary | ICD-10-CM | POA: Insufficient documentation

## 2021-01-08 DIAGNOSIS — H538 Other visual disturbances: Secondary | ICD-10-CM

## 2021-01-08 DIAGNOSIS — J441 Chronic obstructive pulmonary disease with (acute) exacerbation: Secondary | ICD-10-CM

## 2021-01-08 DIAGNOSIS — I4891 Unspecified atrial fibrillation: Secondary | ICD-10-CM | POA: Diagnosis not present

## 2021-01-08 DIAGNOSIS — R059 Cough, unspecified: Secondary | ICD-10-CM | POA: Diagnosis not present

## 2021-01-08 DIAGNOSIS — R079 Chest pain, unspecified: Secondary | ICD-10-CM

## 2021-01-08 DIAGNOSIS — E559 Vitamin D deficiency, unspecified: Secondary | ICD-10-CM

## 2021-01-08 IMAGING — CR DG CHEST 2V
2 series · 2 of 2 positions shown · non-contrast
Comparison: Multiple priors

CLINICAL DATA: Cough for over a month

EXAM:
CHEST - 2 VIEW

[w pa chest]
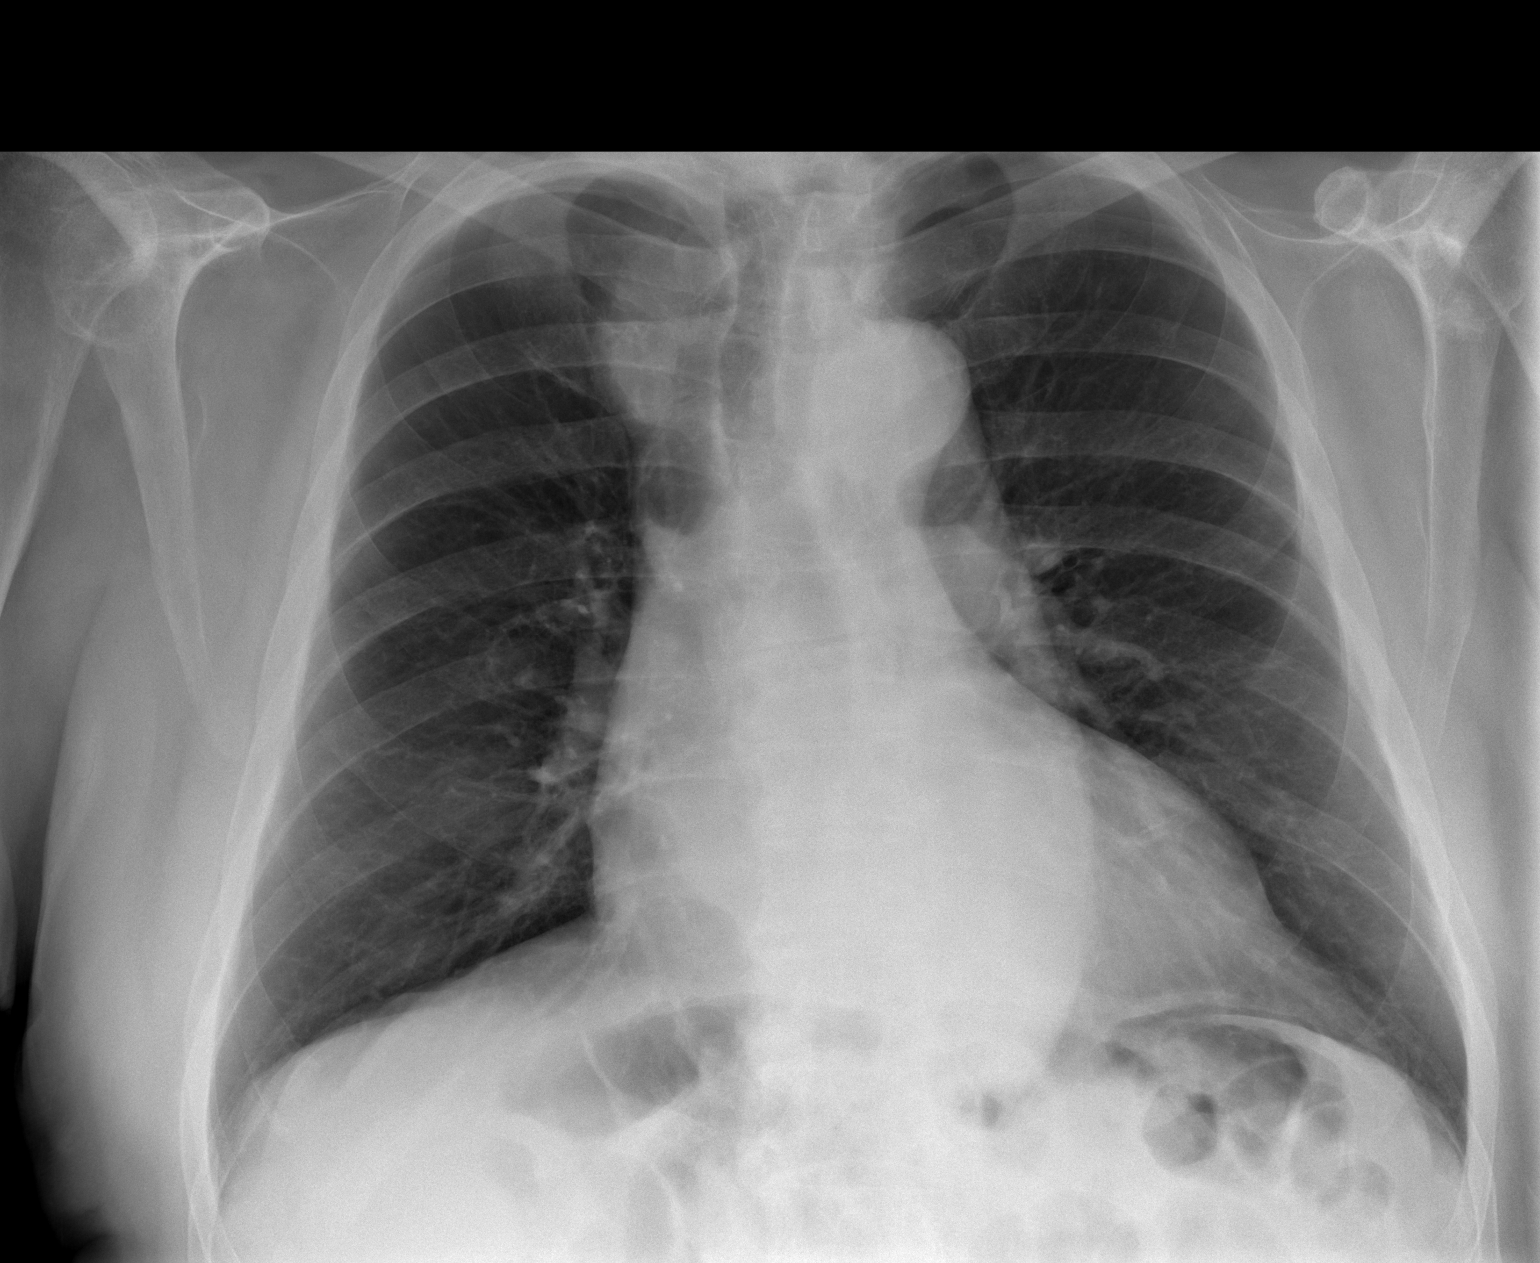

[w chest lat]
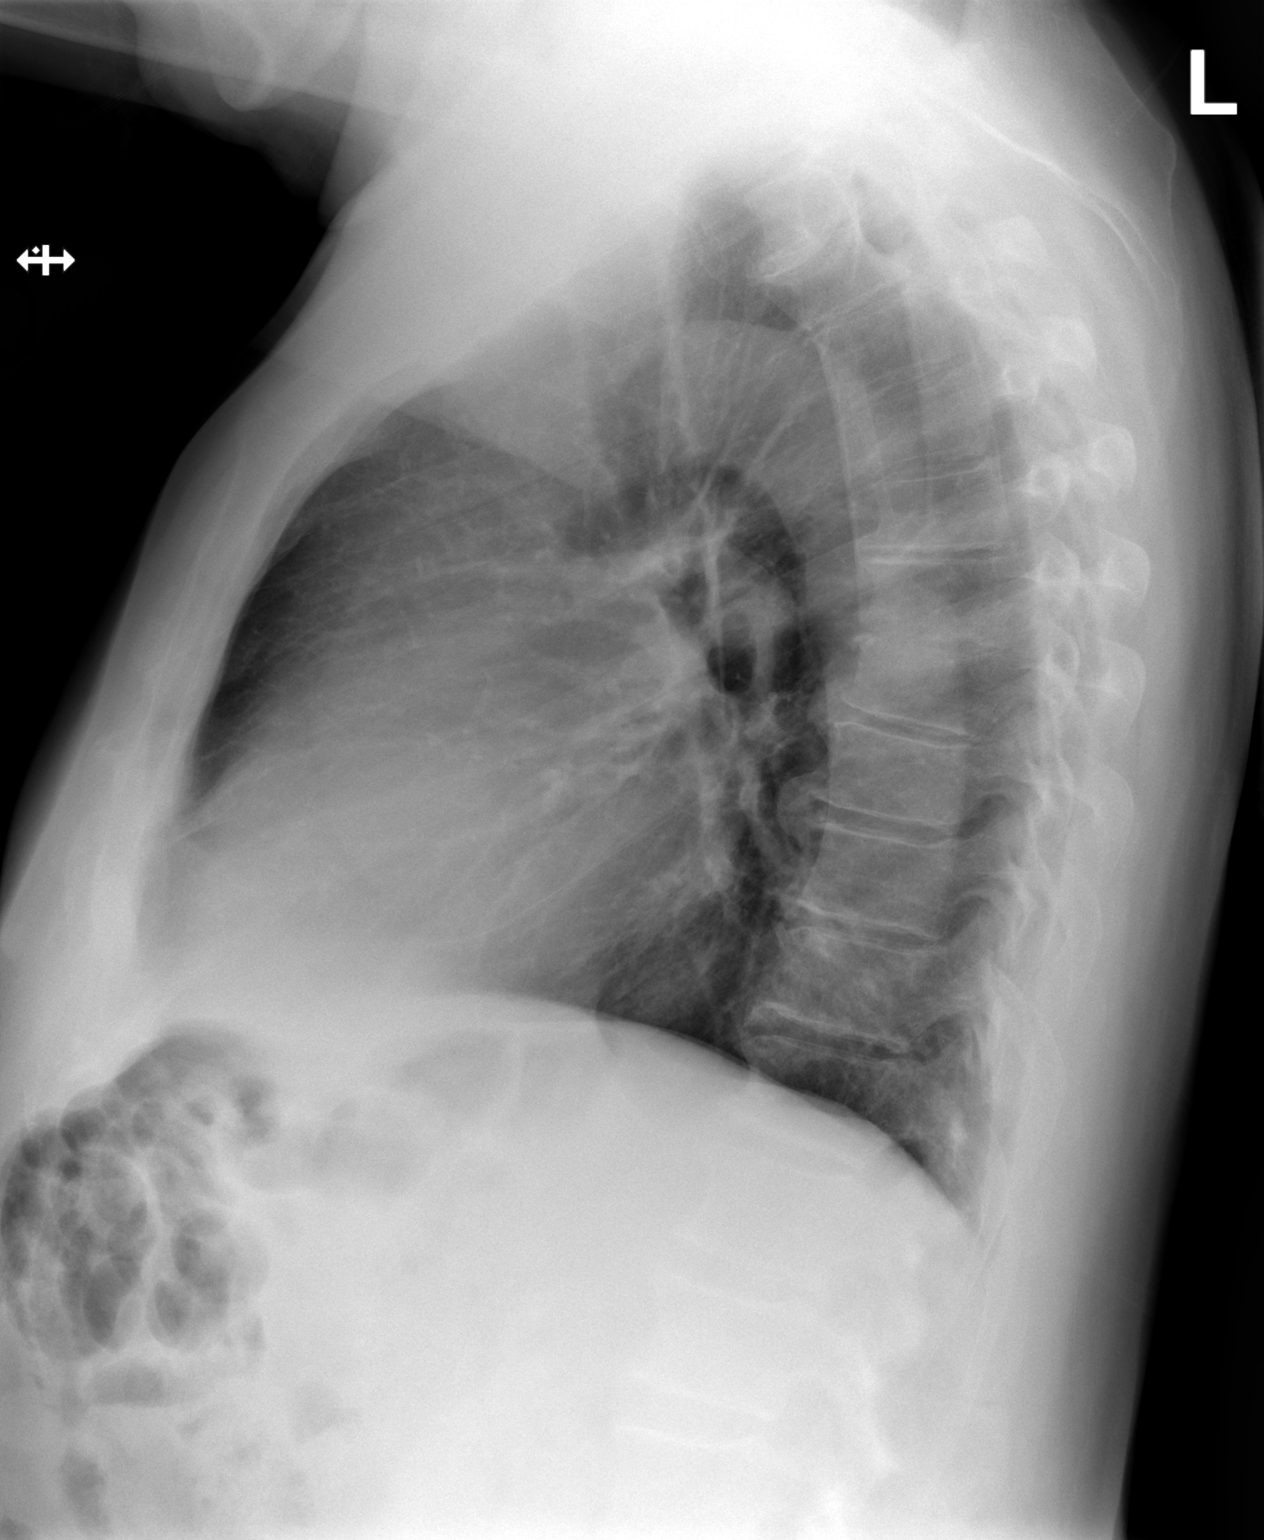

[2 of 2 positions shown; findings below may reference images not displayed]

FINDINGS: The heart appears at the upper limit of normal. Stable appearance of
the cardiomediastinal silhouette including right superior
paratracheal opacity, previously described to correspond to central
vasculature, and remains similar in appearance to [5K]. No pleural
effusion. No pneumothorax. No mass or consolidation. No acute
osseous abnormality.
IMPRESSION: No acute findings in the chest.  No consolidative pneumonia.

## 2021-01-08 IMAGING — CT CT HEAD W/O CM
3 series · 15 of 47 positions shown, 18 images · non-contrast
Comparison: None.

CLINICAL DATA: Diplopia. Blurred vision. Reduced peripheral vision
in both eyes. Dizziness.

EXAM:
CT HEAD WITHOUT CONTRAST
TECHNIQUE: Contiguous axial images were obtained from the base of the skull
through the vertex without intravenous contrast.

[Series 2: head w o · axial · 0.42mm/px · z∈[+58,+188]mm · 9 of 32 slices shown, 12 images]
[im 3/32  brain]
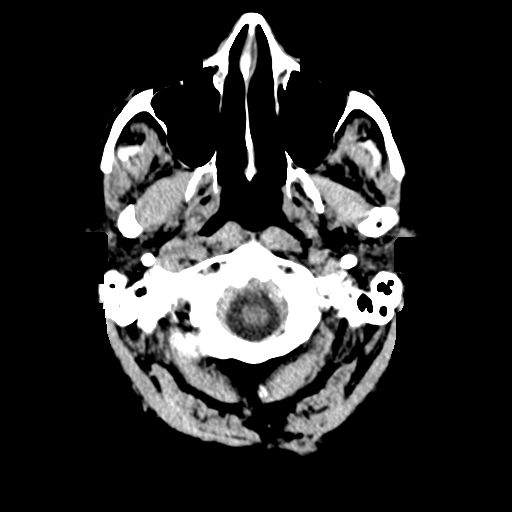
[im 3/32  bone]
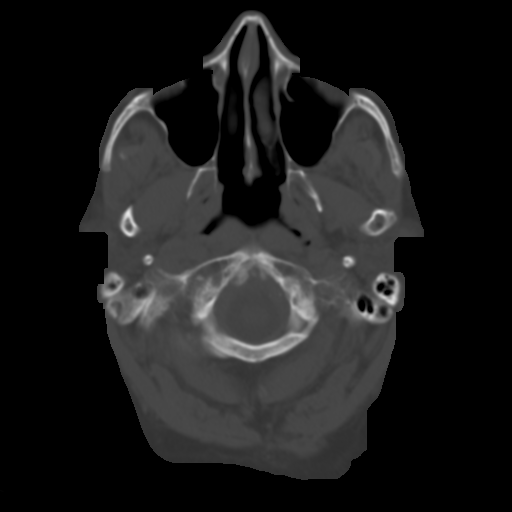
[im 6/32  brain]
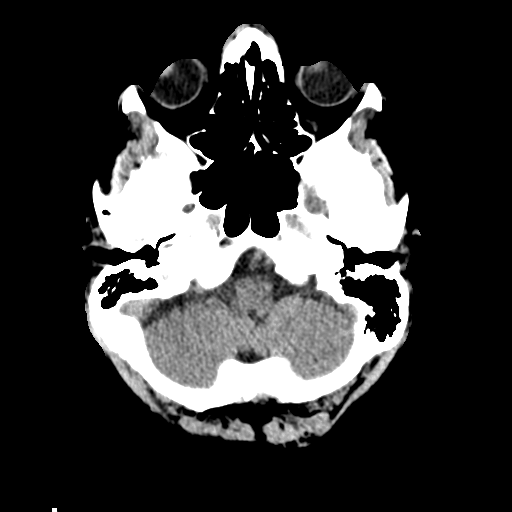
[im 9/32  brain]
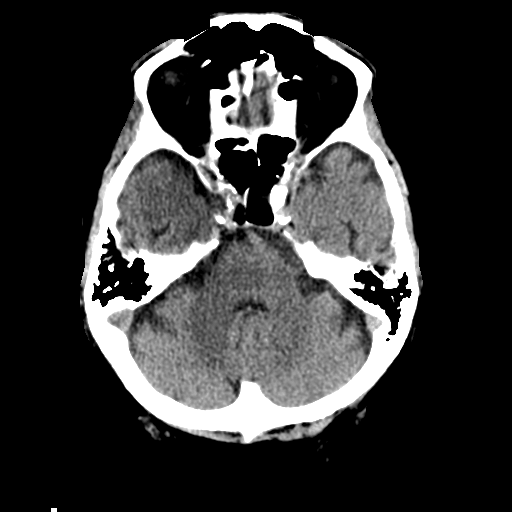
[im 12/32  brain]
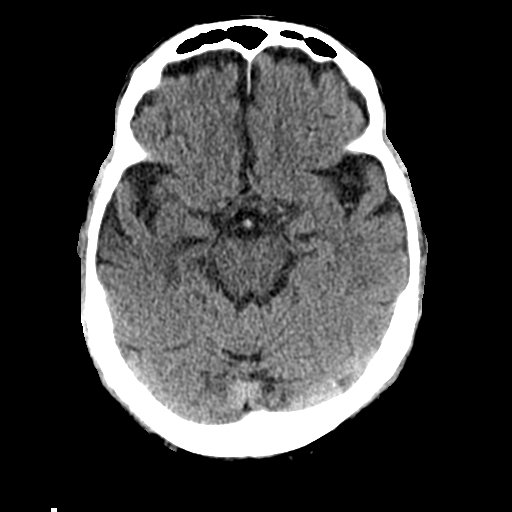
[im 17/32  brain]
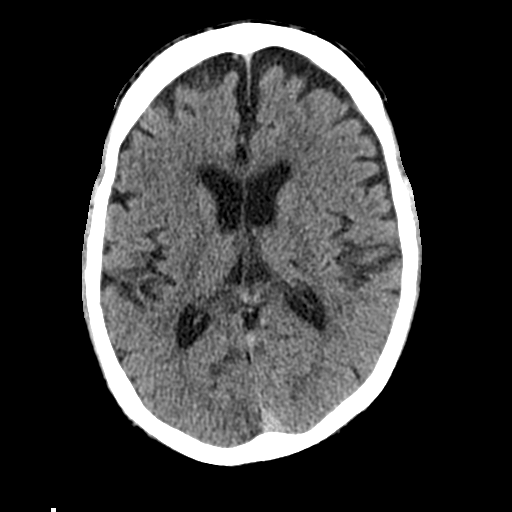
[im 17/32  bone]
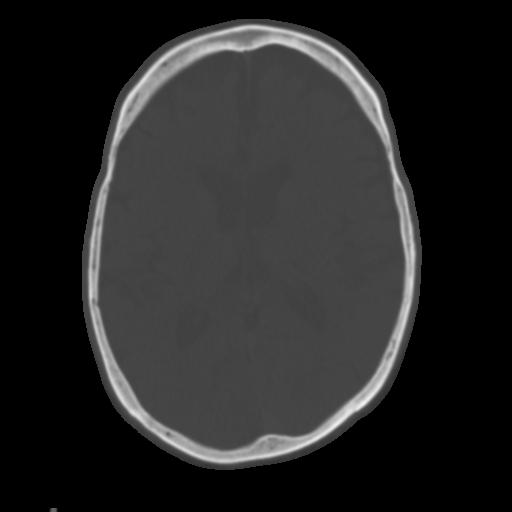
[im 20/32  brain]
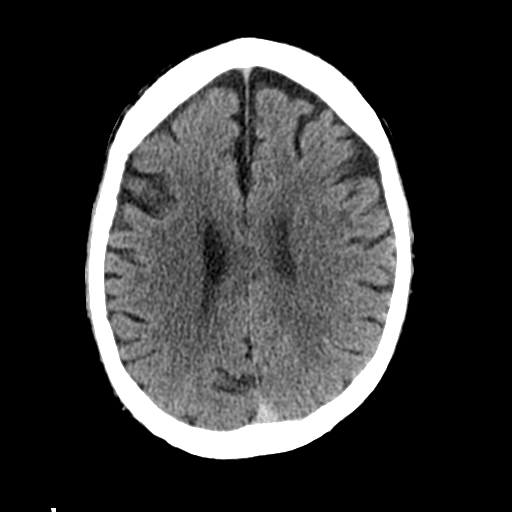
[im 23/32  brain]
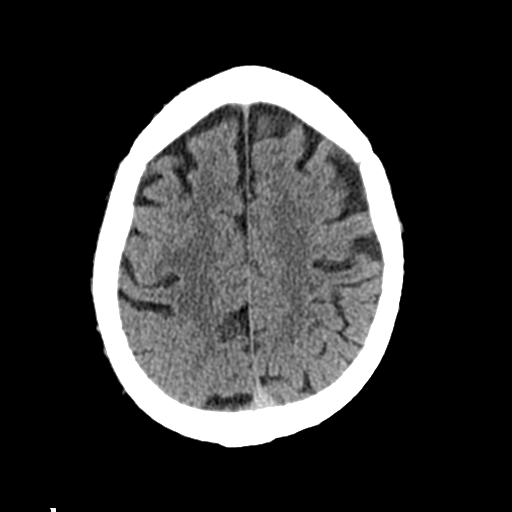
[im 26/32  brain]
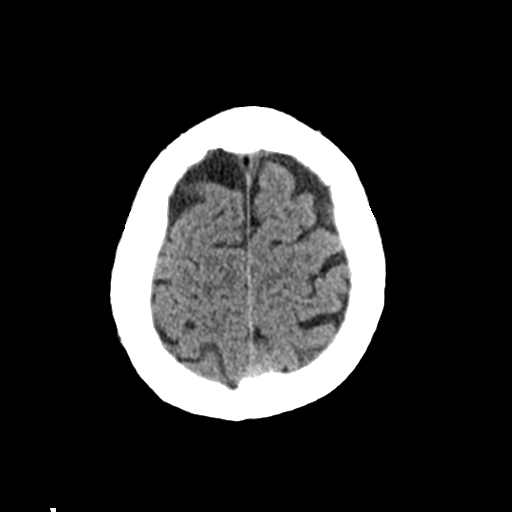
[im 29/32  brain]
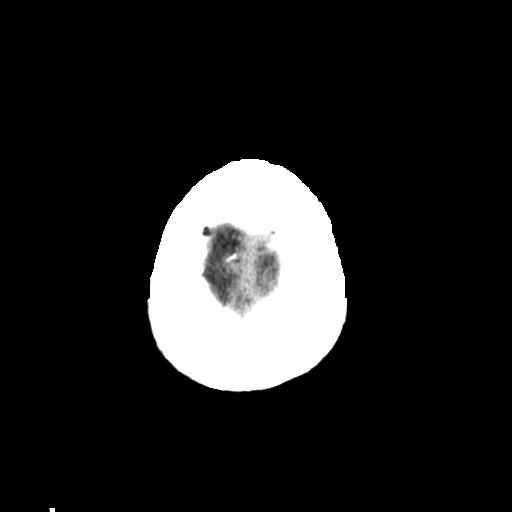
[im 29/32  bone]
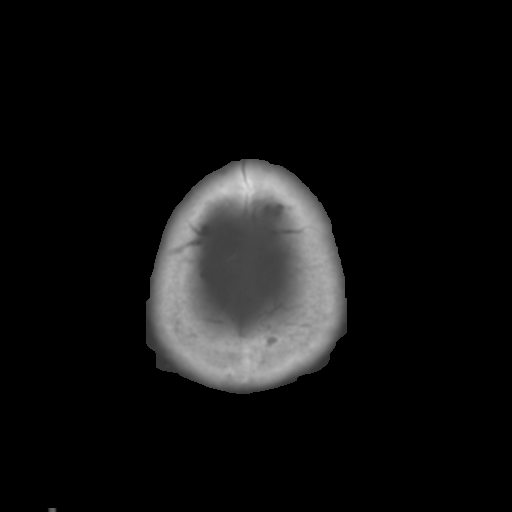

[Series 4: coronal soft · coronal · 0.33mm/px · 3 of 67 slices shown]
[im 23/67  brain]
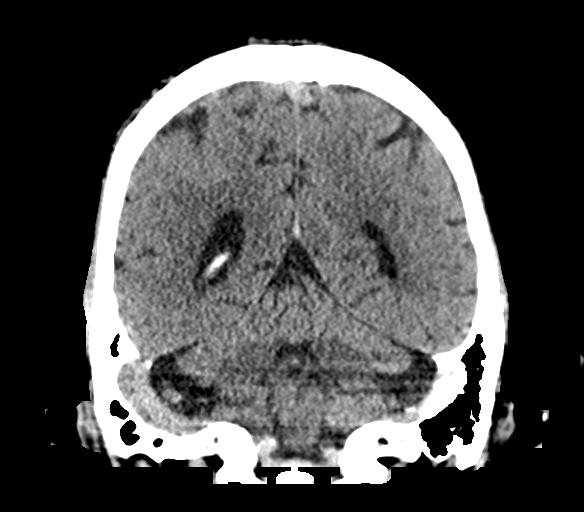
[im 30/67  brain]
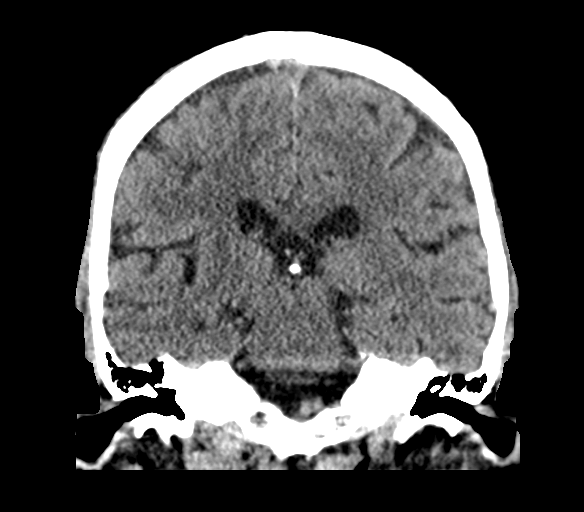
[im 37/67  brain]
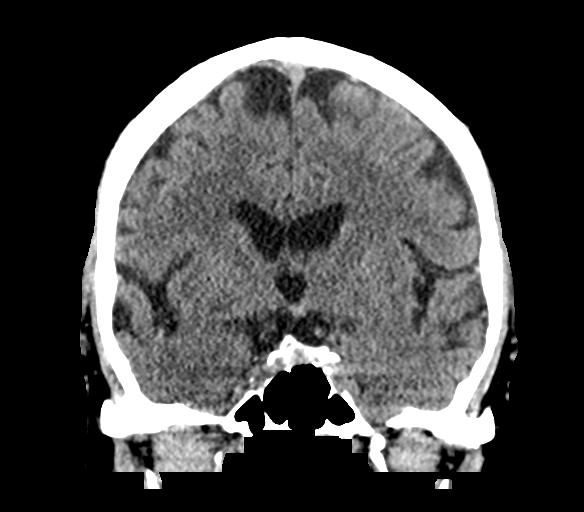

[Series 5: sagittal soft · sagittal · 0.33mm/px · 3 of 57 slices shown]
[im 19/57  brain]
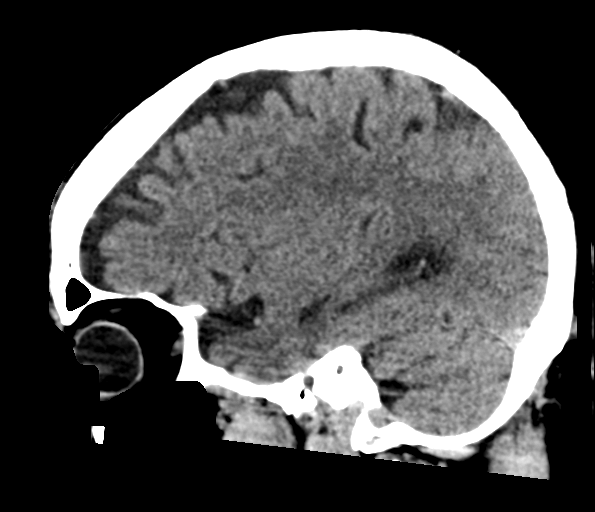
[im 29/57  brain]
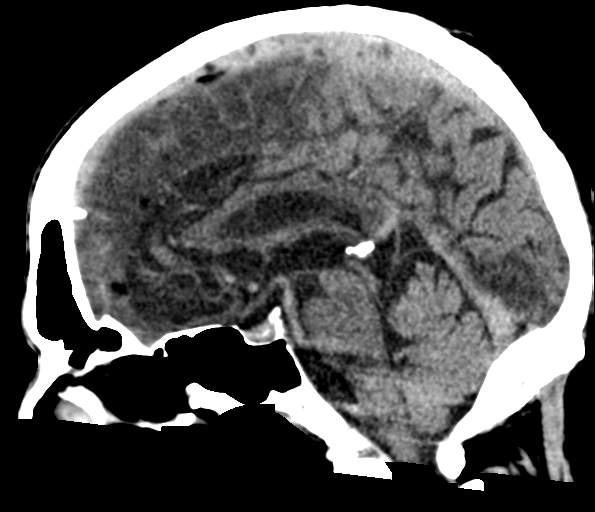
[im 38/57  brain]
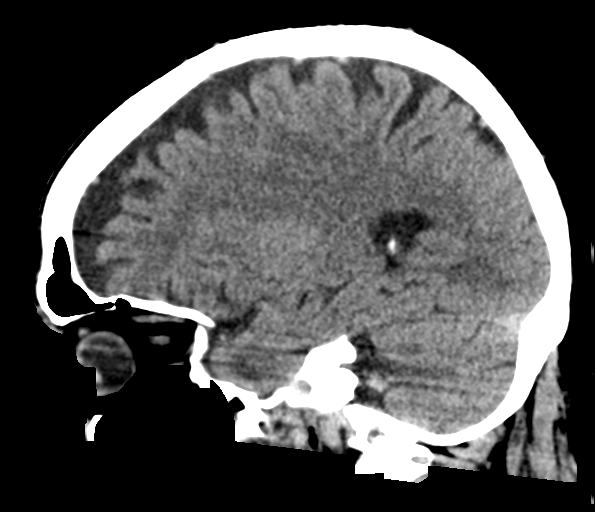

[15 of 47 positions shown; findings below may reference images not displayed]

FINDINGS: Brain: Hypodensity in the right occipital lobe compatible with prior
posterior cerebral artery distribution stroke, at least late
subacute and possibly chronic given the degree of hypodensity.

Periventricular white matter and corona radiata hypodensities favor
chronic ischemic microvascular white matter disease.

Otherwise, the brainstem, cerebellum, cerebral peduncles, thalamus,
basal ganglia, basilar cisterns, and ventricular system appear
within normal limits. No intracranial hemorrhage or mass lesion
identified.

Vascular: There is atherosclerotic calcification of the cavernous
carotid arteries bilaterally.

Skull: Unremarkable

Sinuses/Orbits: Visualized paranasal sinuses appear clear. No gross
orbital abnormality, although please note that today's exam was not
a dedicated CT of the orbits.

Other: No supplemental non-categorized findings.
IMPRESSION: 1. Abnormal hypodensity in the right posterior cerebral artery
distribution compatible with either chronic stroke or a late
subacute stroke. This could certainly affect vision.
2. Periventricular white matter and corona radiata hypodensities
favor chronic ischemic microvascular white matter disease.

## 2021-01-08 NOTE — Progress Notes (Signed)
Subjective:    Patient ID: Jacob Sanders, male    DOB: 1945-08-02, 75 y.o.   MRN: [id number]  Chief Complaint  Patient presents with   Eye Problem    Started 12/16 dizzy left said of ribs and chest. Tingling in feet. Left leg numb and tingling right foot toes are numb and tingling. 12/19 was put on steroid. Wants lab work. Eye appt tomorrow    Dizziness   Pt presents to the office today with recurrent cough and congestion. He is having chest pressure, dizziness, and vision changes. He states he can not see anything peripheral vision and blurred vision.   He was seen on 12/31/20 for COPD exacerbation and given methylprednisolone that helped.  Eye Problem   Dizziness This is a new problem. The current episode started in the past 7 days (12/28/20). The problem occurs intermittently. The problem has been waxing and waning. Associated symptoms include chest pain, congestion and a visual change. Pertinent negatives include no sore throat, swollen glands or urinary symptoms.  Nicotine Dependence Presents for follow-up visit. Symptoms are negative for sore throat. His urge triggers include company of smokers. The symptoms have been stable. He smokes < 1/2 a pack of cigarettes per day.     Review of Systems  HENT:  Positive for congestion. Negative for sore throat.   Cardiovascular:  Positive for chest pain.  Neurological:  Positive for dizziness.  All other systems reviewed and are negative.  Family History  Problem Relation Age of Onset   Hypertension Mother    Aneurysm Father    Hypertension Father    Social History   Socioeconomic History   Marital status: Married    Spouse name: Anne Ng   Number of children: 3   Years of education: 10   Highest education level: 10th grade  Occupational History   Occupation: Retired  Tobacco Use   Smoking status: Every Day    Packs/day: 1.00    Years: 45.00    Pack years: 45.00    Types: Cigars, Cigarettes   Smokeless tobacco: Never   Vaping Use   Vaping Use: Never used  Substance and Sexual Activity   Alcohol use: No   Drug use: No   Sexual activity: Not Currently  Other Topics Concern   Not on file  Social History Narrative   Not on file   Social Determinants of Health   Financial Resource Strain: Low Risk    Difficulty of Paying Living Expenses: Not hard at all  Food Insecurity: No Food Insecurity   Worried About Charity fundraiser in the Last Year: Never true   [place] in the Last Year: Never true  Transportation Needs: No Transportation Needs   Lack of Transportation (Medical): No   Lack of Transportation (Non-Medical): No  Physical Activity: Insufficiently Active   Days of Exercise per Week: 7 days   Minutes of Exercise per Session: 10 min  Stress: No Stress Concern Present   Feeling of Stress : Not at all  Social Connections: Socially Integrated   Frequency of Communication with Friends and Family: More than three times a week   Frequency of Social Gatherings with Friends and Family: Once a week   Attends Religious Services: More than 4 times per year   Active Member of [employer] or Organizations: Yes   Attends Music therapist: More than 4 times per year   Marital Status: Married       Objective:  Physical Exam Vitals reviewed.  Constitutional:      General: He is not in acute distress.    Appearance: He is well-developed.  HENT:     Head: Normocephalic.     Right Ear: Tympanic membrane normal.     Left Ear: Tympanic membrane normal.  Eyes:     General:        Right eye: No discharge.        Left eye: No discharge.     Pupils: Pupils are equal, round, and reactive to light.     Comments: Decrease peripheral vision  Neck:     Thyroid: No thyromegaly.  Cardiovascular:     Rate and Rhythm: Normal rate and regular rhythm.     Heart sounds: Normal heart sounds. No murmur heard. Pulmonary:     Effort: Pulmonary effort is normal. No respiratory distress.     Breath  sounds: Rhonchi present. No wheezing.  Abdominal:     General: Bowel sounds are normal. There is no distension.     Palpations: Abdomen is soft.     Tenderness: There is no abdominal tenderness.  Musculoskeletal:        General: No tenderness. Normal range of motion.     Cervical back: Normal range of motion and neck supple.  Skin:    General: Skin is warm and dry.     Findings: No erythema or rash.  Neurological:     Mental Status: He is alert and oriented to person, place, and time.     Cranial Nerves: No cranial nerve deficit.     Deep Tendon Reflexes: Reflexes are normal and symmetric.  Psychiatric:        Mood and Affect: Mood is anxious.        Behavior: Behavior normal.        Thought Content: Thought content normal.        Judgment: Judgment normal.    BP (!) 137/91    Pulse 70    Temp 97.9 F (36.6 C) (Temporal)    Ht 5' 10"  (1.778 m)    Wt 191 lb (86.6 kg)    SpO2 98%    BMI 27.41 kg/m      Assessment & Plan:  Jacob Sanders comes in today with chief complaint of Eye Problem (Started 12/16 dizzy left said of ribs and chest. Tingling in feet. Left leg numb and tingling right foot toes are numb and tingling. 12/19 was put on steroid. Wants lab work. Eye appt tomorrow ) and Dizziness   Diagnosis and orders addressed:  1. Dizziness Stat Head ordered - CT HEAD WO CONTRAST (5MM); Future - CMP14+EGFR - CBC with Differential/Platelet  2. Decreased peripheral vision of both eyes - CT HEAD WO CONTRAST (5MM); Future - CMP14+EGFR - CBC with Differential/Platelet  3. Blurred vision - CT HEAD WO CONTRAST (5MM); Future - CMP14+EGFR - CBC with Differential/Platelet  4. Subacute cough Chest x-ray ordered May need CT chest - DG Chest 2 View - CMP14+EGFR - CBC with Differential/Platelet  5. COPD exacerbation (HCC) - DG Chest 2 View - CMP14+EGFR - CBC with Differential/Platelet - EKG 12-Lead  6. Vitamin D deficiency - CMP14+EGFR - CBC with  Differential/Platelet - VITAMIN D 25 Hydroxy (Vit-D Deficiency, Fractures)  7. Chest pain, unspecified type - EKG 12-Lead  8. Atrial fibrillation, unspecified type (HCC)   Appr 45 mins spent with patient and wife  Labs pending Health Maintenance reviewed Diet and exercise encouraged  Follow up plan: Follow  up in 2 weeks   Evelina Dun, FNP

## 2021-01-08 NOTE — Patient Instructions (Signed)
Dizziness Dizziness is a common problem. It is a feeling of unsteadiness or light-headedness. You may feel like you are about to faint. Dizziness can lead to injury if you stumble or fall. Anyone can become dizzy, but dizziness is more common in older adults. This condition can be caused by a number of things, including medicines, dehydration, or illness. Follow these instructions at home: Eating and drinking  Drink enough fluid to keep your urine pale yellow. This helps to keep you from becoming dehydrated. Try to drink more clear fluids, such as water. Do not drink alcohol. Limit your caffeine intake if told to do so by your health care provider. Check ingredients and nutrition facts to see if a food or beverage contains caffeine. Limit your salt (sodium) intake if told to do so by your health care provider. Check ingredients and nutrition facts to see if a food or beverage contains sodium. Activity  Avoid making quick movements. Rise slowly from chairs and steady yourself until you feel okay. In the morning, first sit up on the side of the bed. When you feel okay, stand slowly while you hold onto something until you know that your balance is good. If you need to stand in one place for a long time, move your legs often. Tighten and relax the muscles in your legs while you are standing. Do not drive or use machinery if you feel dizzy. Avoid bending down if you feel dizzy. Place items in your home so that they are easy for you to reach without leaning over. Lifestyle Do not use any products that contain nicotine or tobacco. These products include cigarettes, chewing tobacco, and vaping devices, such as e-cigarettes. If you need help quitting, ask your health care provider. Try to reduce your stress level by using methods such as yoga or meditation. Talk with your health care provider if you need help to manage your stress. General instructions Watch your dizziness for any changes. Take  over-the-counter and prescription medicines only as told by your health care provider. Talk with your health care provider if you think that your dizziness is caused by a medicine that you are taking. Tell a friend or a family member that you are feeling dizzy. If he or she notices any changes in your behavior, have this person call your health care provider. Keep all follow-up visits. This is important. Contact a health care provider if: Your dizziness does not go away or you have new symptoms. Your dizziness or light-headedness gets worse. You feel nauseous. You have reduced hearing. You have a fever. You have neck pain or a stiff neck. Your dizziness leads to an injury or a fall. Get help right away if: You vomit or have diarrhea and are unable to eat or drink anything. You have problems talking, walking, swallowing, or using your arms, hands, or legs. You feel generally weak. You have any bleeding. You are not thinking clearly or you have trouble forming sentences. It may take a friend or family member to notice this. You have chest pain, abdominal pain, shortness of breath, or sweating. Your vision changes or you develop a severe headache. These symptoms may represent a serious problem that is an emergency. Do not wait to see if the symptoms will go away. Get medical help right away. Call your local emergency services (911 in the [place] Do not drive yourself to the hospital. Summary Dizziness is a feeling of unsteadiness or light-headedness. This condition can be caused by a number of   things, including medicines, dehydration, or illness. Anyone can become dizzy, but dizziness is more common in older adults. Drink enough fluid to keep your urine pale yellow. Do not drink alcohol. Avoid making quick movements if you feel dizzy. Monitor your dizziness for any changes. This information is not intended to replace advice given to you by your health care provider. Make sure you discuss any  questions you have with your health care provider. Document Revised: 12/05/2019 Document Reviewed: 12/05/2019 Elsevier Patient Education  2022 [employer]  

## 2021-01-09 ENCOUNTER — Other Ambulatory Visit: Payer: Self-pay | Admitting: Family

## 2021-01-09 ENCOUNTER — Telehealth: Payer: Self-pay | Admitting: Family

## 2021-01-09 DIAGNOSIS — H539 Unspecified visual disturbance: Secondary | ICD-10-CM

## 2021-01-09 DIAGNOSIS — H2513 Age-related nuclear cataract, bilateral: Secondary | ICD-10-CM | POA: Diagnosis not present

## 2021-01-09 DIAGNOSIS — H353132 Nonexudative age-related macular degeneration, bilateral, intermediate dry stage: Secondary | ICD-10-CM | POA: Diagnosis not present

## 2021-01-09 DIAGNOSIS — Z8673 Personal history of transient ischemic attack (TIA), and cerebral infarction without residual deficits: Secondary | ICD-10-CM

## 2021-01-09 DIAGNOSIS — H43812 Vitreous degeneration, left eye: Secondary | ICD-10-CM | POA: Diagnosis not present

## 2021-01-09 LAB — CBC WITH DIFFERENTIAL/PLATELET
Basophils Absolute: 0.1 10*3/uL (ref 0.0–0.2)
Basos: 1 %
EOS (ABSOLUTE): 0.1 10*3/uL (ref 0.0–0.4)
Eos: 1 %
Hematocrit: 51.8 % — ABNORMAL HIGH (ref 37.5–51.0)
Hemoglobin: 17.3 g/dL (ref 13.0–17.7)
Immature Grans (Abs): 0.2 10*3/uL — ABNORMAL HIGH (ref 0.0–0.1)
Immature Granulocytes: 1 %
Lymphocytes Absolute: 2.3 10*3/uL (ref 0.7–3.1)
Lymphs: 16 %
MCH: 29.8 pg (ref 26.6–33.0)
MCHC: 33.4 g/dL (ref 31.5–35.7)
MCV: 89 fL (ref 79–97)
Monocytes Absolute: 1.2 10*3/uL — ABNORMAL HIGH (ref 0.1–0.9)
Monocytes: 9 %
Neutrophils Absolute: 10 10*3/uL — ABNORMAL HIGH (ref 1.4–7.0)
Neutrophils: 72 %
Platelets: 307 10*3/uL (ref 150–450)
RBC: 5.81 x10E6/uL — ABNORMAL HIGH (ref 4.14–5.80)
RDW: 14.5 % (ref 11.6–15.4)
WBC: 13.8 10*3/uL — ABNORMAL HIGH (ref 3.4–10.8)

## 2021-01-09 LAB — VITAMIN D 25 HYDROXY (VIT D DEFICIENCY, FRACTURES): Vit D, 25-Hydroxy: 32.4 ng/mL (ref 30.0–100.0)

## 2021-01-09 LAB — CMP14+EGFR
ALT: 11 IU/L (ref 0–44)
AST: 12 IU/L (ref 0–40)
Albumin/Globulin Ratio: 1.7 (ref 1.2–2.2)
Albumin: 4.3 g/dL (ref 3.7–4.7)
Alkaline Phosphatase: 88 IU/L (ref 44–121)
BUN/Creatinine Ratio: 19 (ref 10–24)
BUN: 20 mg/dL (ref 8–27)
Bilirubin Total: 0.7 mg/dL (ref 0.0–1.2)
CO2: 25 mmol/L (ref 20–29)
Calcium: 9.5 mg/dL (ref 8.6–10.2)
Chloride: 100 mmol/L (ref 96–106)
Creatinine, Ser: 1.06 mg/dL (ref 0.76–1.27)
Globulin, Total: 2.5 g/dL (ref 1.5–4.5)
Glucose: 103 mg/dL — ABNORMAL HIGH (ref 70–99)
Potassium: 4.7 mmol/L (ref 3.5–5.2)
Sodium: 139 mmol/L (ref 134–144)
Total Protein: 6.8 g/dL (ref 6.0–8.5)
eGFR: 74 mL/min/{1.73_m2} (ref >59)

## 2021-01-09 NOTE — Telephone Encounter (Signed)
Please call patient with lab results

## 2021-01-09 NOTE — Telephone Encounter (Signed)
[facility] not here today patient and wife are wanting labs reviewed today. Please advise.

## 2021-01-09 NOTE — Telephone Encounter (Signed)
Results sent to [facility].

## 2021-01-10 ENCOUNTER — Telehealth: Payer: Self-pay | Admitting: Neurology

## 2021-01-10 ENCOUNTER — Encounter: Payer: Self-pay | Admitting: Neurology

## 2021-01-10 ENCOUNTER — Ambulatory Visit: Payer: Medicare Other | Admitting: Neurology

## 2021-01-10 VITALS — BP 121/86 | HR 66 | Ht 70.0 in | Wt 182.5 lb

## 2021-01-10 DIAGNOSIS — R7309 Other abnormal glucose: Secondary | ICD-10-CM | POA: Diagnosis not present

## 2021-01-10 DIAGNOSIS — I639 Cerebral infarction, unspecified: Secondary | ICD-10-CM

## 2021-01-10 DIAGNOSIS — I4891 Unspecified atrial fibrillation: Secondary | ICD-10-CM | POA: Diagnosis not present

## 2021-01-10 DIAGNOSIS — I6930 Unspecified sequelae of cerebral infarction: Secondary | ICD-10-CM | POA: Insufficient documentation

## 2021-01-10 DIAGNOSIS — R799 Abnormal finding of blood chemistry, unspecified: Secondary | ICD-10-CM | POA: Diagnosis not present

## 2021-01-10 DIAGNOSIS — R300 Dysuria: Secondary | ICD-10-CM | POA: Diagnosis not present

## 2021-01-10 NOTE — Telephone Encounter (Signed)
[employer] medicare no auth req order sent for [facility] to be scheduled at [facility], they will reach out to the patient to schedule.

## 2021-01-10 NOTE — Progress Notes (Signed)
Chief Complaint  Patient presents with   New Patient (Initial Visit)    Rm 15. Accompanied by wife. PCP is Evelina Dun, NP. NP internal referral for H/O CVA, vision changes Pt states he took doxycycline and had a stroke soon after along with vision changes. Pt c/o numbness and tingling in left arm and left leg. He states there is tightness in his left arm, left shoulder, and left ribcage.      ASSESSMENT AND PLAN  Jacob Sanders is a 75 y.o. male   Stroke on Dec 16th 2022  Presented with acute onset of vertigo, blurry vision, left arm and leg numbness,  CT head on January 08, 2021 showed right occipital subacute versus chronic stroke  Vascular risk factor of aging, atrial fibrillation, previously was treated with Coumadin, is no longer on anticoagulation treatment due to bloody urine, will refer to urology to look for potential etiology  Start aspirin 81 mg  Laboratory evaluations including lipid panel, A1c, UA,  Complete evaluation with MRI of the brain, MR angiogram of the brain and neck  Return to clinic in 3 months     DIAGNOSTIC DATA (LABS, IMAGING, TESTING) - I reviewed patient records, labs, notes, testing and imaging myself where available.  ECHO on August 08 2020:  1. Left ventricular ejection fraction, by estimation, is 60 to 65%. The  left ventricle has normal function. The left ventricle has no regional  wall motion abnormalities. There is mild left ventricular hypertrophy.  Left ventricular diastolic parameters  are indeterminate.   2. Right ventricular systolic function is normal. The right ventricular  size is normal. There is mildly elevated pulmonary artery systolic  pressure.   3. Left atrial size was severely dilated.   4. Right atrial size was severely dilated.  MEDICAL HISTORY:  Jacob Sanders is a 75 year old male, seen in request by his primary care nurse practitioner Evelina Dun A, for evaluation of stroke, he is accompanied by his wife at  today's visit January 10, 2021,  I reviewed and summarized the referring note. PMHX HTN Atrial Fibrillation, hx of taking warfarin, cause of bloody urine, stopped warfarin since 2019. Long time smoker  He suffered more than a month long history of upper respiratory infection, treated with few rounds of antibiotics, December 28, 2020, he woke up from overnight sleep, felt dizziness, unsteady gait, difficulty focusing, he will complain his wife to clean up the church, 2 hours later, when he was trying to get into the car, he was noted to have more difficulty, complains of left arm and numbness, could not fasten his seatbelt, also noted to be confused, agitated  He was evaluated by primary care on December 19, referred to CT head on January 08, 2021, I personally reviewed hypodensity at right posterior cerebral artery distribution, right occipital lobe, subacute versus chronic stroke, periventricular small vessel disease  Over the past few weeks, he has mild improvement, but not back to baseline yet  Echocardiogram August 08, 2020, ejection fraction 60 to 65%, right and left atrial size with severe dilated  He had long history of atrial fibrillation, previously was treated with warfarin, but he has developed bloody urine, stopped the warfarin since 2019, he is not on anticoagulation treatment either  PHYSICAL EXAM:   Vitals:   01/10/21 0830  BP: 121/86  Pulse: 66  Weight: 182 lb 8 oz (82.8 kg)  Height: 5\' 10"  (1.778 m)   Not recorded     Body mass index is  26.19 kg/m.  PHYSICAL EXAMNIATION:  Gen: NAD, conversant, well nourised, well groomed                     Cardiovascular: Irregular rate and arrhythmia, no peripheral edema, warm, nontender. Eyes: Conjunctivae clear without exudates or hemorrhage Neck: Supple, no carotid bruits. Pulmonary: Clear to auscultation bilaterally   NEUROLOGICAL EXAM:  MENTAL STATUS: Speech:    Speech is normal; fluent and spontaneous with normal  comprehension.  Cognition:     Orientation to time, place and person     Normal recent and remote memory     Normal Attention span and concentration     Normal Language, naming, repeating,spontaneous speech     Fund of knowledge   CRANIAL NERVES: CN II: Pupils are round equal and briskly reactive to light.  Left inferior vision deficit CN III, IV, VI: extraocular movement are normal. No ptosis. CN V: Facial sensation is intact to light touch CN VII: Face is symmetric with normal eye closure  CN VIII: Hearing is normal to causal conversation. CN IX, X: Phonation is normal. CN XI: Head turning and shoulder shrug are intact  MOTOR: There is no pronator drift of out-stretched arms. Muscle bulk and tone are normal. Muscle strength is normal.  REFLEXES: Reflexes are 2+ and symmetric at the biceps, triceps, knees, and ankles. Plantar responses are flexor.  SENSORY: Intact to light touch, pinprick and vibratory sensation are intact in fingers and toes.  COORDINATION: There is no trunk or limb dysmetria noted.  GAIT/STANCE: Need push-up to get up from seated position, cautious Romberg is absent.  REVIEW OF SYSTEMS:  Full 14 system review of systems performed and notable only for as above All other review of systems were negative.   ALLERGIES: Allergies  Allergen Reactions   Warfarin And Related Other (See Comments)    Bleeding.   Morphine And Related     Agitation    Prednisone Swelling    HOME MEDICATIONS: Current Outpatient Medications  Medication Sig Dispense Refill   aspirin EC 81 MG EC tablet Take 1 tablet (81 mg total) by mouth daily. 90 tablet 3   Budeson-Glycopyrrol-Formoterol (BREZTRI AEROSPHERE) 160-9-4.8 MCG/ACT AERO Inhale 2 puffs into the lungs 2 (two) times daily. 10.7 g 11   busPIRone (BUSPAR) 5 MG tablet Take 1 tablet (5 mg total) by mouth 2 (two) times daily. Dx. F41.1 180 tablet 1   dextromethorphan-guaiFENesin (MUCINEX DM) 30-600 MG 12hr tablet Take 1  tablet by mouth 2 (two) times daily. 30 tablet 1   diltiazem (CARDIZEM CD) 300 MG 24 hr capsule Take 1 capsule (300 mg total) by mouth daily. 90 capsule 1   meclizine (ANTIVERT) 25 MG tablet TAKE 1 TABLET BY MOUTH 3  TIMES DAILY AS NEEDED FOR  DIZZINESS 180 tablet 0   omeprazole (PRILOSEC) 20 MG capsule TAKE 1 CAPSULE BY MOUTH  DAILY AS NEEDED 90 capsule 0   sodium chloride (OCEAN) 0.65 % SOLN nasal spray Place 1 spray into both nostrils as needed for congestion. 60 mL 0   No current facility-administered medications for this visit.    PAST MEDICAL HISTORY: Past Medical History:  Diagnosis Date   Arthritis    Atrial fibrillation (HCC)    Dysrhythmia    afib   GERD (gastroesophageal reflux disease)    History of kidney stones    passed   Hyperlipidemia    Hypertension    Nerve damage    left hand after accident- 1995  Seasonal allergies    Staph infection    post back surgeries 1998 and1999    PAST SURGICAL HISTORY: Past Surgical History:  Procedure Laterality Date   BACK SURGERY  [id number]   discectomy   HAND SURGERY Left 1995   OPEN REDUCTION INTERNAL FIXATION (ORIF) DISTAL RADIAL FRACTURE Left 07/29/2019   Procedure: OPEN REDUCTION INTERNAL FIXATION (ORIF) LEFT DISTAL RADIAL FRACTURE;  Surgeon: Roseanne Kaufman, MD;  Location: [facility];  Service: Orthopedics;  Laterality: Left;  90 mins   TONSILLECTOMY     childhood    TOOTH EXTRACTION  3 weeks ago ;   had abcess beneath tooth; experiened some bleeding for 20 hours post op   TOTAL HIP ARTHROPLASTY Left 05/22/2016   Procedure: LEFT TOTAL HIP ARTHROPLASTY ANTERIOR APPROACH;  Surgeon: Rod Can, MD;  Location: [facility];  Service: Orthopedics;  Laterality: Left;  Dr. requesting RNFA   TOTAL HIP ARTHROPLASTY Right 07/17/2016   Procedure: RIGHT TOTAL HIP ARTHROPLASTY ANTERIOR APPROACH;  Surgeon: Rod Can, MD;  Location: [facility];  Service: Orthopedics;  Laterality: Right;  needs RNFA    FAMILY HISTORY: Family History   Problem Relation Age of Onset   Hypertension Mother    Aneurysm Father    Hypertension Father     SOCIAL HISTORY: Social History   Socioeconomic History   Marital status: Married    Spouse name: Anne Ng   Number of children: 3   Years of education: 10   Highest education level: 10th grade  Occupational History   Occupation: Retired  Tobacco Use   Smoking status: Every Day    Packs/day: 1.00    Years: 45.00    Pack years: 45.00    Types: Cigars, Cigarettes   Smokeless tobacco: Never  Vaping Use   Vaping Use: Never used  Substance and Sexual Activity   Alcohol use: No   Drug use: No   Sexual activity: Not Currently  Other Topics Concern   Not on file  Social History Narrative   Not on file   Social Determinants of Health   Financial Resource Strain: Low Risk    Difficulty of Paying Living Expenses: Not hard at all  Food Insecurity: No Food Insecurity   Worried About Charity fundraiser in the Last Year: Never true   [place] in the Last Year: Never true  Transportation Needs: No Transportation Needs   Lack of Transportation (Medical): No   Lack of Transportation (Non-Medical): No  Physical Activity: Insufficiently Active   Days of Exercise per Week: 7 days   Minutes of Exercise per Session: 10 min  Stress: No Stress Concern Present   Feeling of Stress : Not at all  Social Connections: Socially Integrated   Frequency of Communication with Friends and Family: More than three times a week   Frequency of Social Gatherings with Friends and Family: Once a week   Attends Religious Services: More than 4 times per year   Active Member of [employer] or Organizations: Yes   Attends Music therapist: More than 4 times per year   Marital Status: Married  Human resources officer Violence: Not At Risk   Fear of Current or Ex-Partner: No   Emotionally Abused: No   Physically Abused: No   Sexually Abused: No      Marcial Pacas, M.D. Ph.D.  [facility] [address] [place], [place] [address] Ph: [phone] Fax: [phone]  CC:  Sharion Balloon, [address] [place]  [place],  [place] [address]  Sharion Balloon, FNP

## 2021-01-11 ENCOUNTER — Other Ambulatory Visit: Payer: Self-pay | Admitting: Family

## 2021-01-11 ENCOUNTER — Telehealth: Payer: Self-pay | Admitting: Family

## 2021-01-11 MED ORDER — LEVOFLOXACIN 500 MG PO TABS: 500.0000 mg | ORAL_TABLET | Freq: Every day | ORAL | 0 refills | Status: AC

## 2021-01-11 NOTE — Addendum Note (Signed)
Addended by: Jannifer Rodney A on: 01/11/2021 01:27 PM   Modules accepted: Orders

## 2021-01-11 NOTE — Telephone Encounter (Signed)
Refer to lab results.  

## 2021-01-13 LAB — URINE CULTURE

## 2021-01-13 LAB — URINALYSIS, ROUTINE W REFLEX MICROSCOPIC
Bilirubin, UA: NEGATIVE
Glucose, UA: NEGATIVE
Ketones, UA: NEGATIVE
Leukocytes,UA: NEGATIVE
Nitrite, UA: NEGATIVE
RBC, UA: NEGATIVE
Specific Gravity, UA: >=1.03 — AB (ref 1.005–1.030)
Urobilinogen, Ur: 1 mg/dL (ref 0.2–1.0)
pH, UA: 5 (ref 5.0–7.5)

## 2021-01-13 LAB — CBC WITH DIFFERENTIAL/PLATELET
Basophils Absolute: 0.1 x10E3/uL (ref 0.0–0.2)
Basos: 0 %
EOS (ABSOLUTE): 0.1 x10E3/uL (ref 0.0–0.4)
Eos: 1 %
Hematocrit: 52.6 % — ABNORMAL HIGH (ref 37.5–51.0)
Hemoglobin: 16.8 g/dL (ref 13.0–17.7)
Immature Grans (Abs): 0.2 x10E3/uL — ABNORMAL HIGH (ref 0.0–0.1)
Immature Granulocytes: 1 %
Lymphocytes Absolute: 1.7 x10E3/uL (ref 0.7–3.1)
Lymphs: 14 %
MCH: 29.3 pg (ref 26.6–33.0)
MCHC: 31.9 g/dL (ref 31.5–35.7)
MCV: 92 fL (ref 79–97)
Monocytes Absolute: 1 x10E3/uL — ABNORMAL HIGH (ref 0.1–0.9)
Monocytes: 8 %
Neutrophils Absolute: 9.3 x10E3/uL — ABNORMAL HIGH (ref 1.4–7.0)
Neutrophils: 76 %
Platelets: 266 x10E3/uL (ref 150–450)
RBC: 5.73 x10E6/uL (ref 4.14–5.80)
RDW: 14.5 % (ref 11.6–15.4)
WBC: 12.2 x10E3/uL — ABNORMAL HIGH (ref 3.4–10.8)

## 2021-01-13 LAB — HEMOGLOBIN A1C
Est. average glucose Bld gHb Est-mCnc: 123 mg/dL
Hgb A1c MFr Bld: 5.9 % — ABNORMAL HIGH (ref 4.8–5.6)

## 2021-01-13 LAB — PSA, TOTAL AND FREE
PSA, Free Pct: 21 %
PSA, Free: 0.21 ng/mL
Prostate Specific Ag, Serum: 1 ng/mL (ref 0.0–4.0)

## 2021-01-13 LAB — LIPID PANEL
Chol/HDL Ratio: 3.9 ratio (ref 0.0–5.0)
Cholesterol, Total: 205 mg/dL — ABNORMAL HIGH (ref 100–199)
HDL: 52 mg/dL (ref >39)
LDL Chol Calc (NIH): 129 mg/dL — ABNORMAL HIGH (ref 0–99)
Triglycerides: 137 mg/dL (ref 0–149)
VLDL Cholesterol Cal: 24 mg/dL (ref 5–40)

## 2021-01-13 LAB — TSH: TSH: 2.23 u[IU]/mL (ref 0.450–4.500)

## 2021-01-15 ENCOUNTER — Telehealth: Payer: Self-pay | Admitting: Neurology

## 2021-01-15 MED ORDER — SIMVASTATIN 10 MG PO TABS: 10.0000 mg | ORAL_TABLET | Freq: Every day | ORAL | 11 refills | Status: DC

## 2021-01-15 NOTE — Telephone Encounter (Signed)
Referral has been sent to [facility]. Phone: [phone].

## 2021-01-15 NOTE — Telephone Encounter (Signed)
I called patient, spoke to patient's wife, per DPR. I discussed his lab results and recommendations. They followed up with his PCP and was prescribed levaquin. I encouraged them to complete this whole RX. Patient will start zocor each evening. Patient's wife verbalized understanding of results and had no further questions or concerns.

## 2021-01-15 NOTE — Telephone Encounter (Signed)
Please call call patient  Laboratory evaluation showed:  Slightly elevated WBC, which could indicate a systemic infection.  There was no evidence of urinary tract infection.  If you have fever, cough, or any other signs of infection, may follow-up with your primary care physician.  There was also evidence of mild elevated low-density lipid, 129, with mildly elevated total cholesterol.  Mild elevated A1c 5.9, indicating mildly elevated glucose level over the past 2 to 3 months.  I have forwarded the lab result to her primary care nurse practitioner Evelina Dun.  I also start Zocor 10mg  everynight for hyperlipidemia  Meds ordered this encounter  Medications   simvastatin (ZOCOR) 10 MG tablet    Sig: Take 1 tablet (10 mg total) by mouth daily.    Dispense:  30 tablet    Refill:  11

## 2021-01-21 ENCOUNTER — Other Ambulatory Visit: Payer: Self-pay | Admitting: Family

## 2021-01-25 ENCOUNTER — Ambulatory Visit (HOSPITAL_COMMUNITY): Payer: Medicare Other

## 2021-02-05 ENCOUNTER — Ambulatory Visit (HOSPITAL_COMMUNITY)
Admission: RE | Admit: 2021-02-05 | Discharge: 2021-02-05 | Disposition: A | Discharge status: Home / Self Care | Payer: Medicare Other | Source: Ambulatory Visit | Attending: Neurology | Admitting: Neurology

## 2021-02-05 ENCOUNTER — Other Ambulatory Visit: Payer: Self-pay

## 2021-02-05 DIAGNOSIS — I4891 Unspecified atrial fibrillation: Secondary | ICD-10-CM | POA: Diagnosis not present

## 2021-02-05 DIAGNOSIS — I6522 Occlusion and stenosis of left carotid artery: Secondary | ICD-10-CM | POA: Diagnosis not present

## 2021-02-05 DIAGNOSIS — R300 Dysuria: Secondary | ICD-10-CM

## 2021-02-05 DIAGNOSIS — I6621 Occlusion and stenosis of right posterior cerebral artery: Secondary | ICD-10-CM | POA: Diagnosis not present

## 2021-02-05 DIAGNOSIS — I639 Cerebral infarction, unspecified: Secondary | ICD-10-CM

## 2021-02-05 DIAGNOSIS — G319 Degenerative disease of nervous system, unspecified: Secondary | ICD-10-CM | POA: Diagnosis not present

## 2021-02-05 IMAGING — MR MR MRA NECK WO/W CM
5 series · 40 of 48 positions shown · IV contrast (10 ml Gadavist)
Comparison: CT head [DATE]

CLINICAL DATA: TIA.

EXAM:
MRI HEAD WITHOUT CONTRAST
MRA HEAD WITHOUT CONTRAST
MRA OF THE NECK WITHOUT AND WITH CONTRAST
TECHNIQUE: Multiplanar, multi-echo pulse sequences of the brain and surrounding
structures were acquired without intravenous contrast. Angiographic
images of the Circle of Willis were acquired using MRA technique
without intravenous contrast. Angiographic images of the neck were
acquired using MRA technique without and with intravenous contrast.
Carotid stenosis measurements (when applicable) are obtained
utilizing NASCET criteria, using the distal internal carotid
diameter as the denominator.
CONTRAST:  10mL GADAVIST GADOBUTROL 1 MMOL/ML IV SOLN

[Series 7: tof_fl3d_tra_iso · axial · 0.6mm · 0.52mm/px · z∈[-140,-46]mm · 10 of 172 slices shown]
[im 12/172]
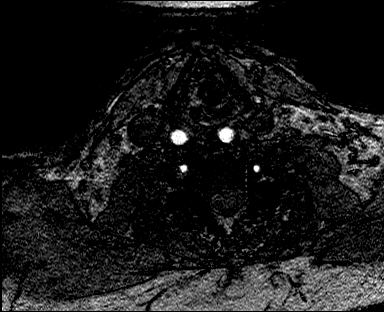
[im 23/172]
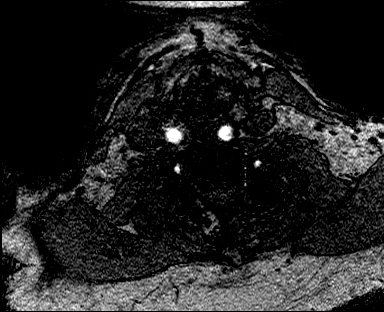
[im 35/172]
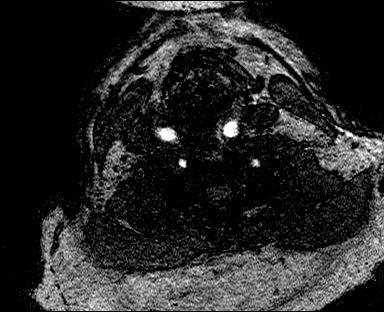
[im 58/172]
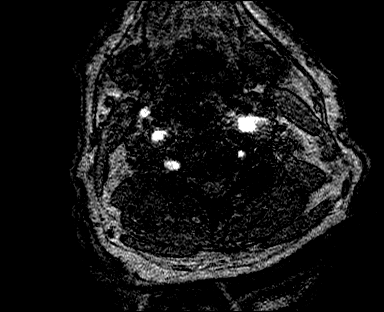
[im 80/172]
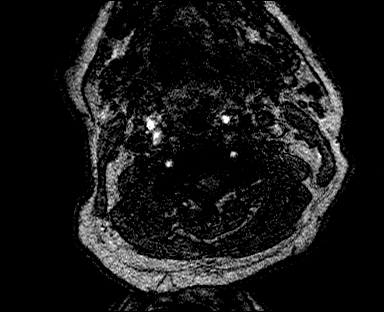
[im 92/172]
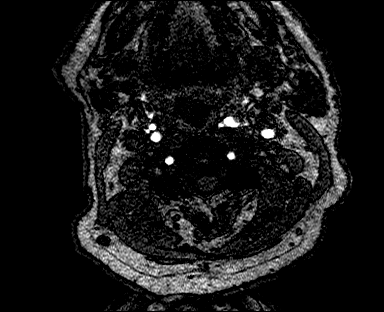
[im 103/172]
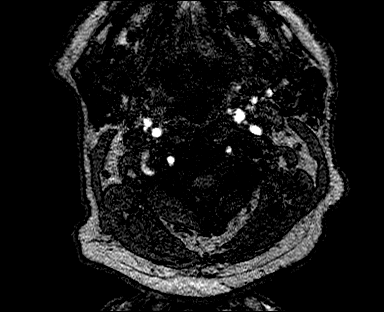
[im 126/172]
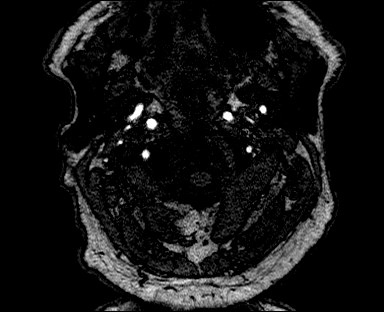
[im 149/172]
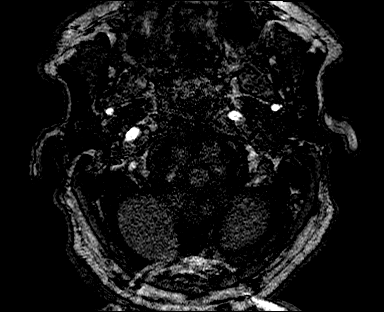
[im 172/172]
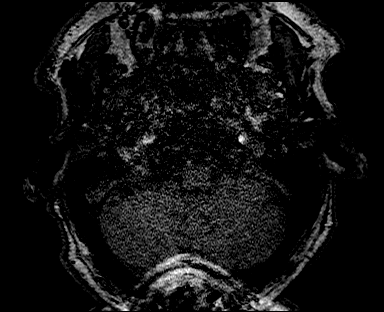

[Series 10: angio_fl3d_cor_pre_ttc=3.0s · coronal · 0.9mm · 0.85mm/px · 8 of 88 slices shown]
[im 1/88]
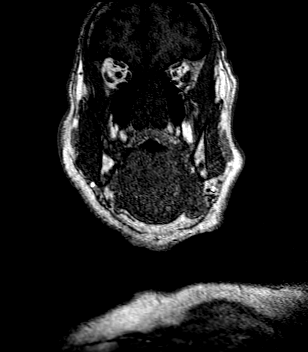
[im 13/88]
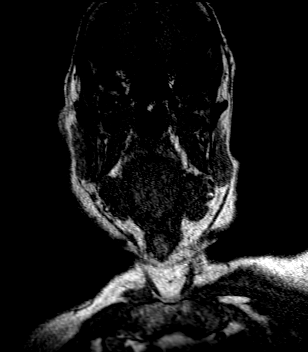
[im 25/88]
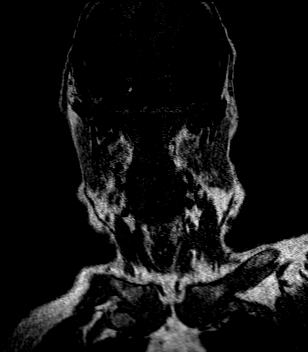
[im 38/88]
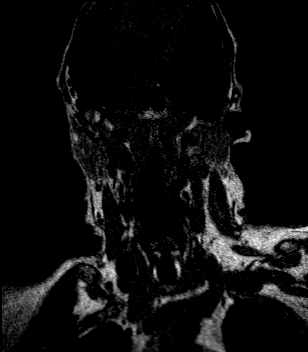
[im 50/88]
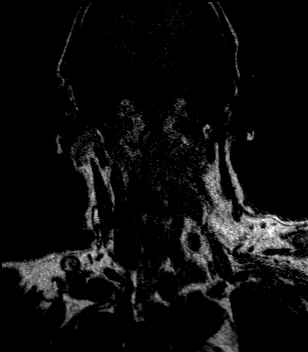
[im 63/88]
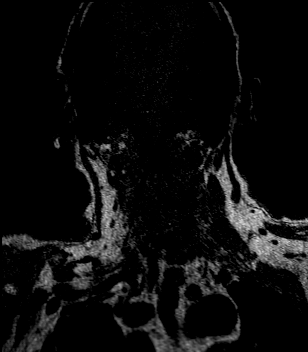
[im 75/88]
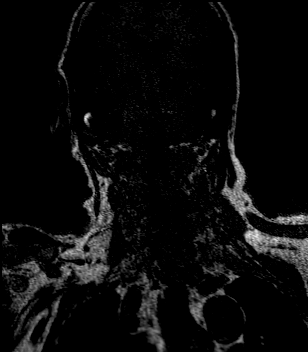
[im 88/88]
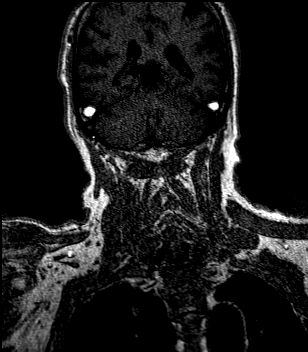

[Series 12: angio_fl3d_cor_post_ttc=3.0s · coronal · 0.9mm · 0.85mm/px · 8 of 88 slices shown]
[im 1/88]
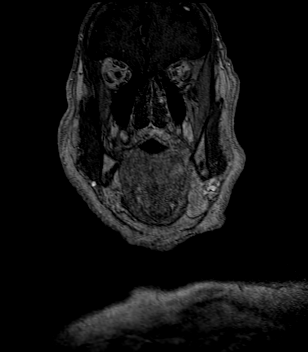
[im 13/88]
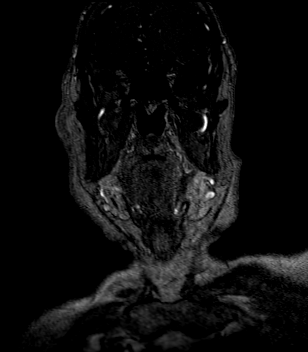
[im 25/88]
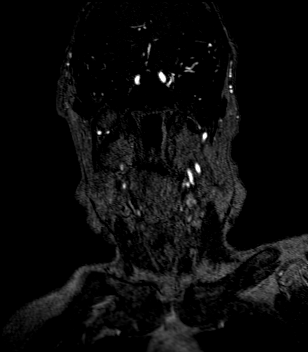
[im 38/88]
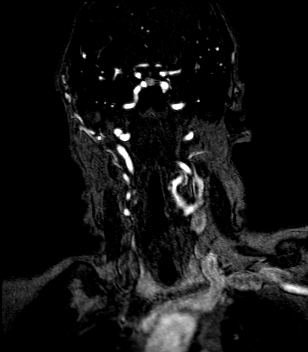
[im 50/88]
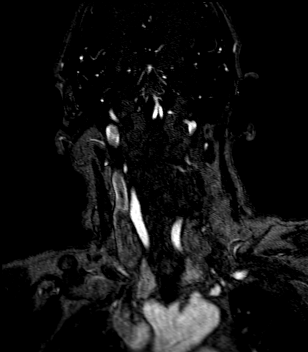
[im 63/88]
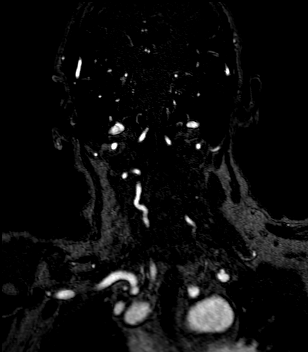
[im 75/88]
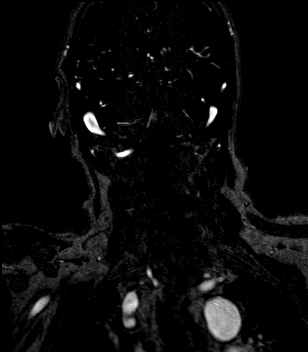
[im 88/88]
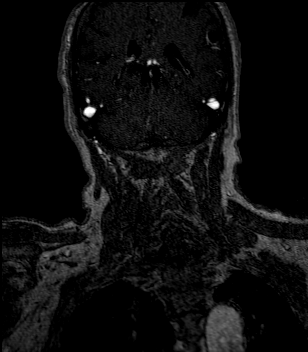

[Series 13: angio_fl3d_cor_post_ttc=3.0s_moco-adv · coronal · 0.9mm · 0.85mm/px · 8 of 88 slices shown]
[im 1/88]
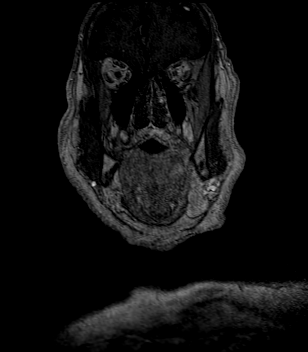
[im 13/88]
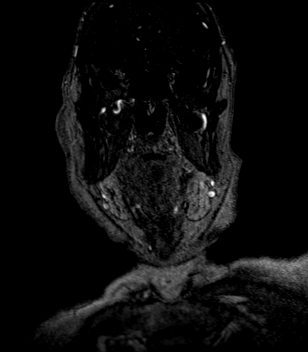
[im 25/88]
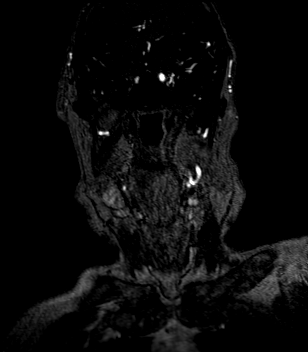
[im 38/88]
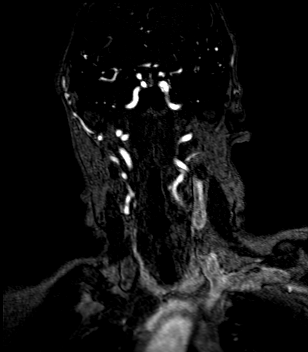
[im 50/88]
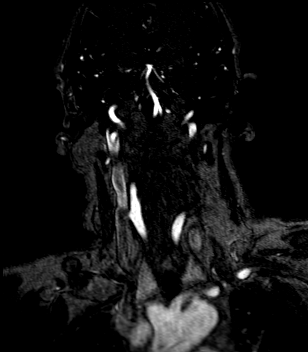
[im 63/88]
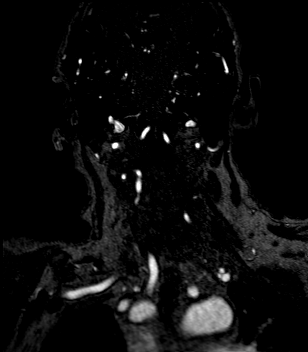
[im 75/88]
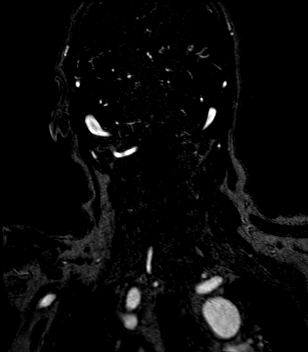
[im 88/88]
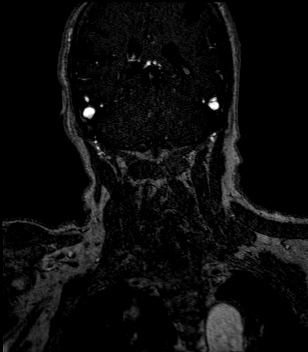

[Series 14: angio_fl3d_cor_post_ttc=3.0s_moco-adv_sub · coronal · 0.9mm · 0.85mm/px · 6 of 86 slices shown]
[im 1/86]
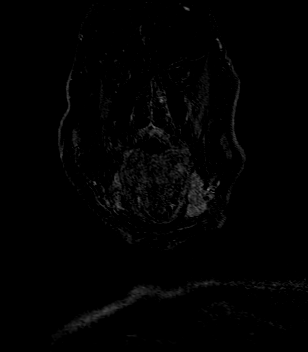
[im 13/86]
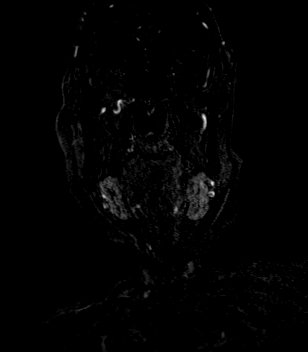
[im 25/86]
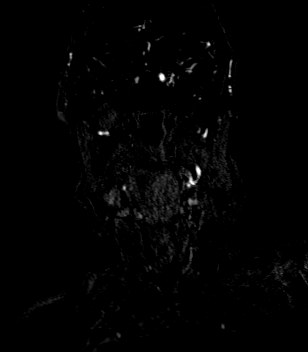
[im 37/86]
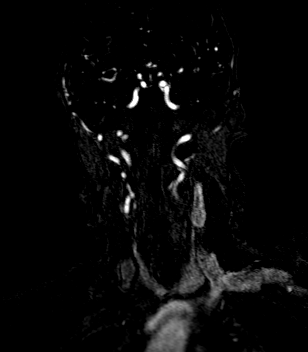
[im 49/86]
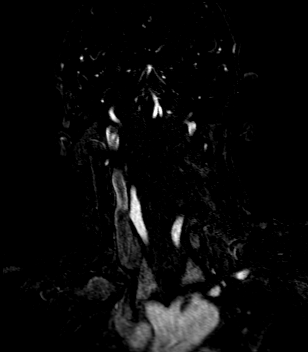
[im 61/86]
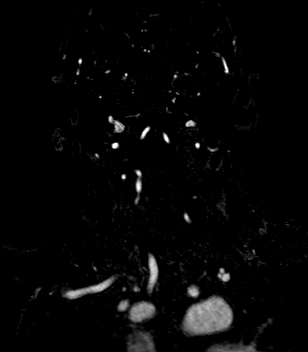

[40 of 48 positions shown; findings below may reference images not displayed]

FINDINGS: MR HEAD FINDINGS

Brain: Infarct in the right occipital lobe appears late subacute.
Hypodensity was seen in this area on the prior CT. On MRI, there is
diffusion hyperintensity and facilitated diffusion in the right
occipital lobe. T1 hyperintensity in the right occipital lobe
consistent with laminar necrosis. There is cortical volume loss. No
associated hemorrhage. There is also a small focus of infarct in the
right mid thalamus which appears of the same age.

Minimal white matter changes in the cerebral hemispheres
bilaterally. Negative for mass lesion. Chronic hemorrhage left
thalamus. Ventricle size normal. Mild cerebral atrophy.

Vascular: Normal arterial flow voids.

Skull and upper cervical spine: No focal skeletal lesion.

Sinuses/Orbits: Paranasal sinuses clear.  Negative orbit

Other: None

MRA HEAD FINDINGS

Anterior circulation: Atherosclerotic irregularity in the cavernous
carotid bilaterally. Moderate stenosis of the supraclinoid internal
carotid artery on the right. Severe stenosis left supraclinoid
internal carotid artery. Anterior and middle cerebral arteries
patent bilaterally without stenosis or occlusion.

Posterior circulation: Both vertebral arteries are patent to the
basilar. PICA patent bilaterally. Basilar widely patent. AICA and
superior cerebellar arteries patent bilaterally

Occlusion of the proximal right posterior cerebral artery P1
segment. Left posterior cerebral artery widely patent.

Anatomic variants:Negative for cerebral aneurysm.

MRA NECK FINDINGS

Aortic arch: Normal aortic arch with standard branching. Proximal
great vessels widely patent.

Right carotid system: Right carotid widely patent

Left carotid system: Left carotid widely patent

Vertebral arteries: Both vertebral arteries appear normal and widely
patent to the basilar.

Other: None
IMPRESSION: 1. Recent infarct in the right PCA territory. This involves the
right occipital lobe with a small infarct in the right thalamus.
This area shows hypodensity on the CT of [DATE] and continues to
show abnormal signal on diffusion-weighted imaging likely due to
facilitated diffusion.
2. Chronic hemorrhage in the left thalamus. Minimal chronic
microvascular ischemic change in the white matter.
3. Severe stenosis of the left supraclinoid internal carotid artery
and moderate stenosis right supraclinoid internal carotid artery.
4. Occlusion of the right P1 segment.
5. Carotid and vertebral arteries are widely patent in the neck.

## 2021-02-05 IMAGING — MR MR MRA HEAD W/O CM
1 series · 18 of 48 positions shown · IV contrast (gadavist)
Comparison: CT head [DATE]

CLINICAL DATA: TIA.

EXAM:
MRI HEAD WITHOUT CONTRAST
MRA HEAD WITHOUT CONTRAST
MRA OF THE NECK WITHOUT AND WITH CONTRAST
TECHNIQUE: Multiplanar, multi-echo pulse sequences of the brain and surrounding
structures were acquired without intravenous contrast. Angiographic
images of the Circle of Willis were acquired using MRA technique
without intravenous contrast. Angiographic images of the neck were
acquired using MRA technique without and with intravenous contrast.
Carotid stenosis measurements (when applicable) are obtained
utilizing NASCET criteria, using the distal internal carotid
diameter as the denominator.
CONTRAST:  10mL GADAVIST GADOBUTROL 1 MMOL/ML IV SOLN

[Series 1: TOF · axial · 0.4mm · 0.43mm/px · z∈[-36,+60]mm · 18 of 254 slices shown]
[im 1/254]
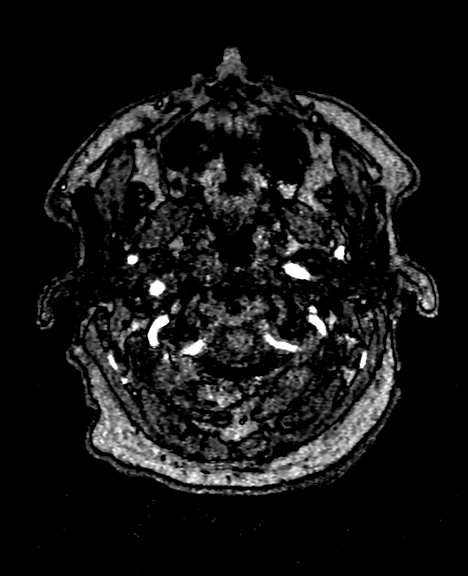
[im 6/254]
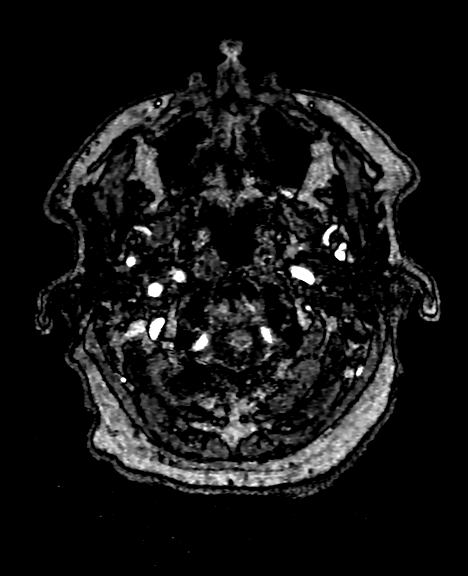
[im 11/254]
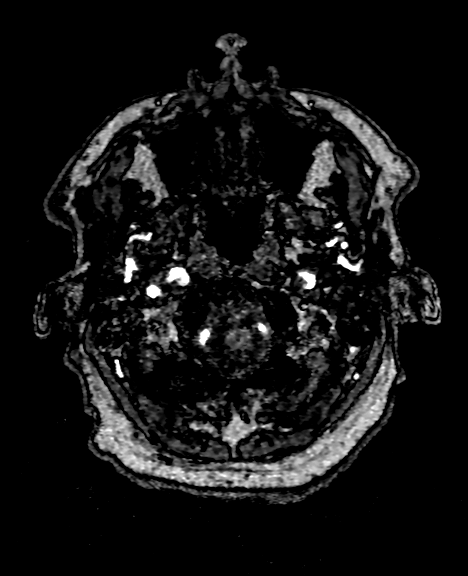
[im 17/254]
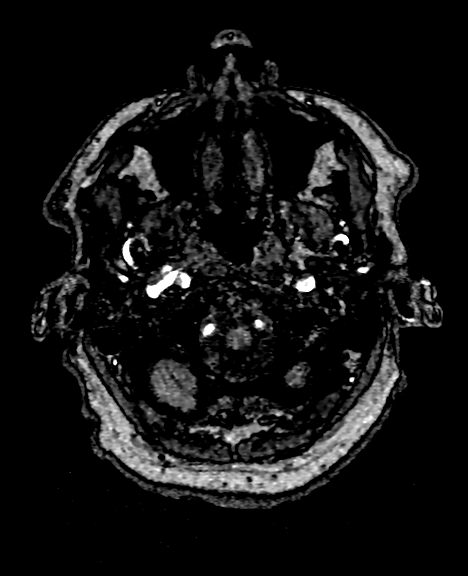
[im 22/254]
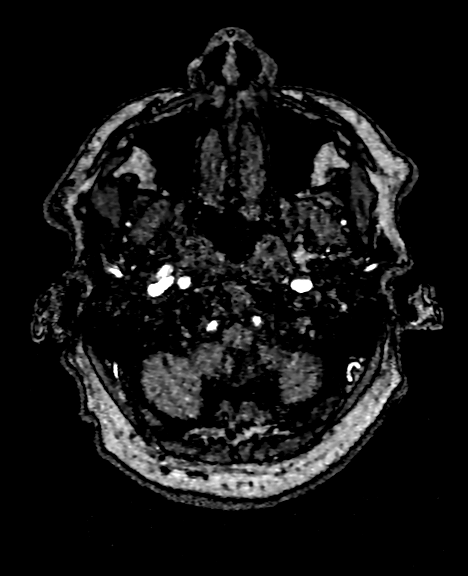
[im 27/254]
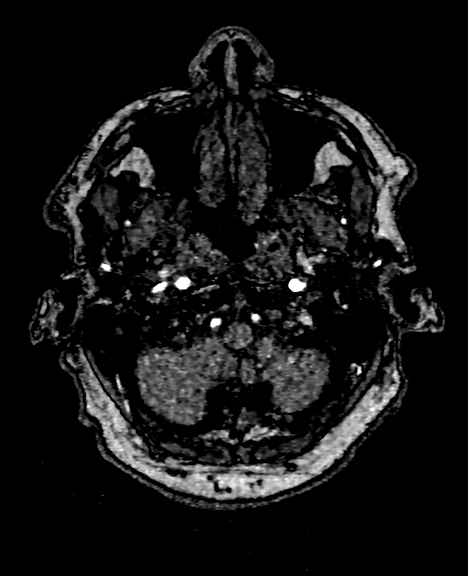
[im 33/254]
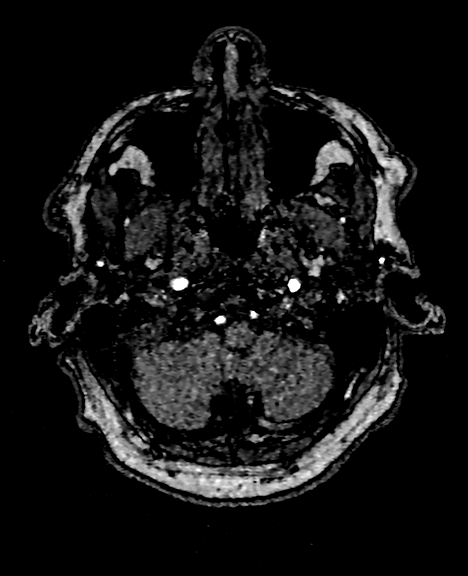
[im 38/254]
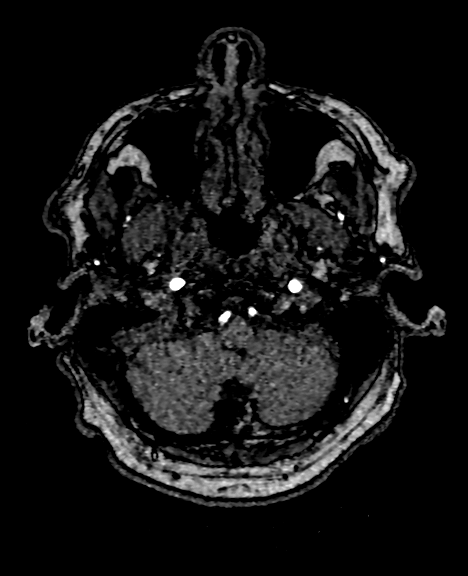
[im 44/254]
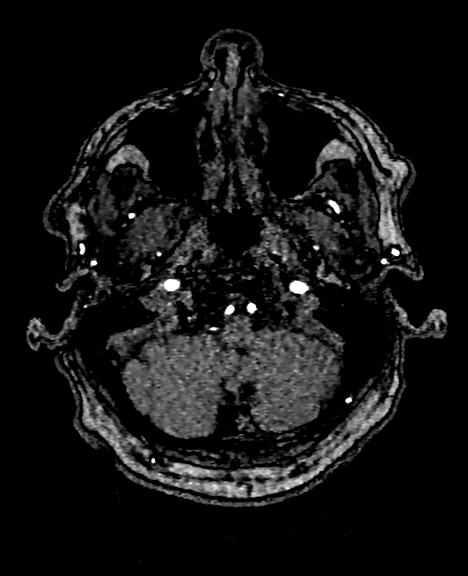
[im 49/254]
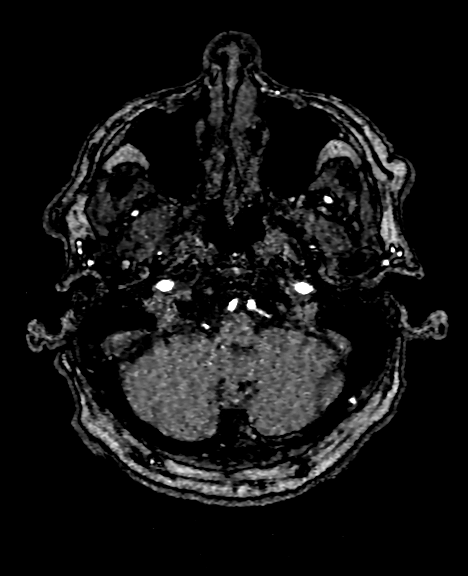
[im 81/254]
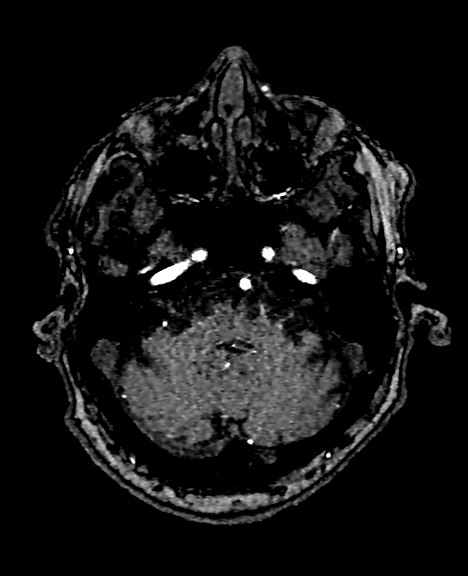
[im 114/254]
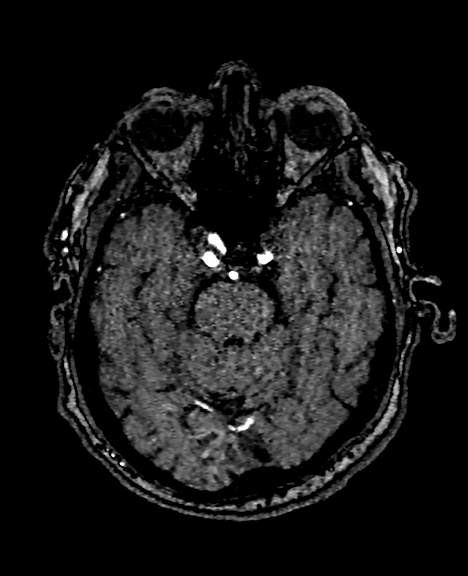
[im 130/254]
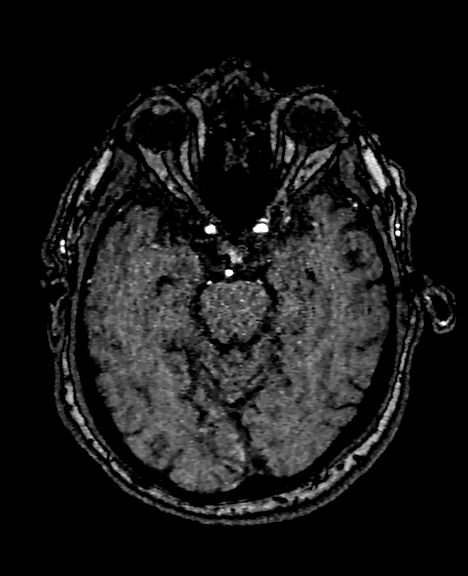
[im 146/254]
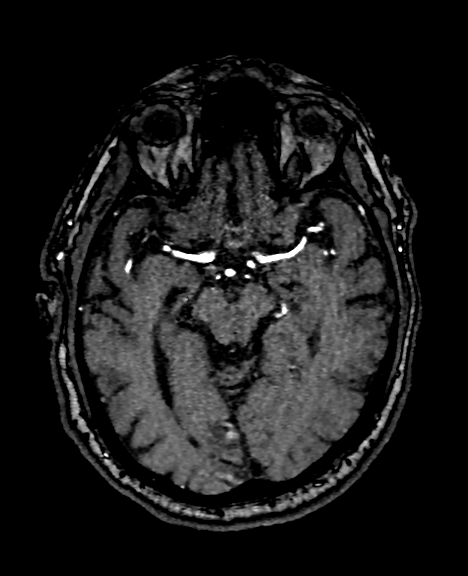
[im 178/254]
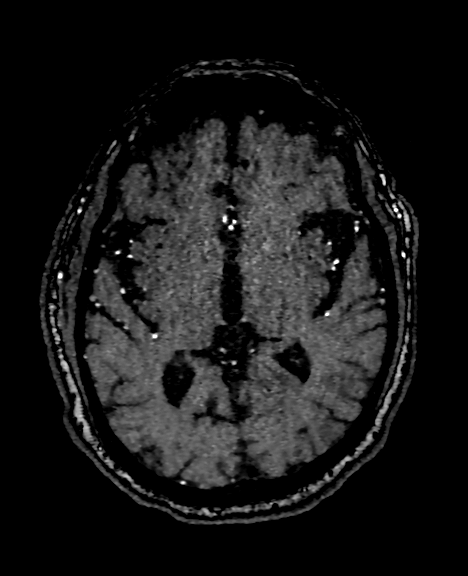
[im 210/254]
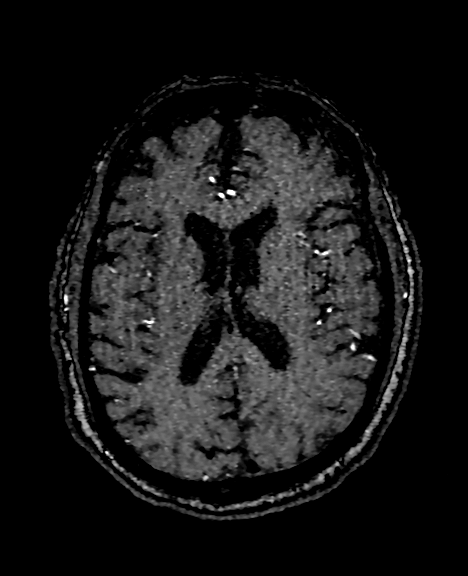
[im 216/254]
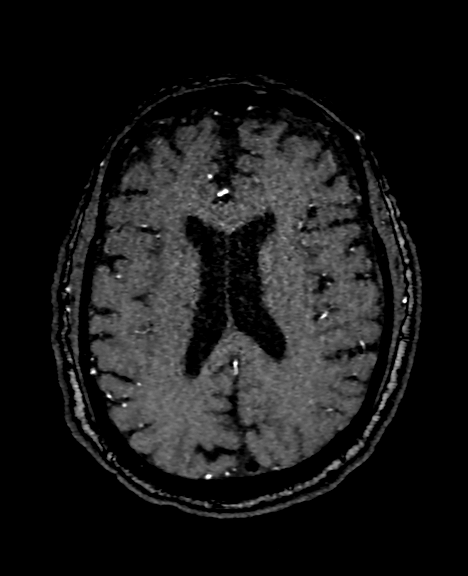
[im 243/254]
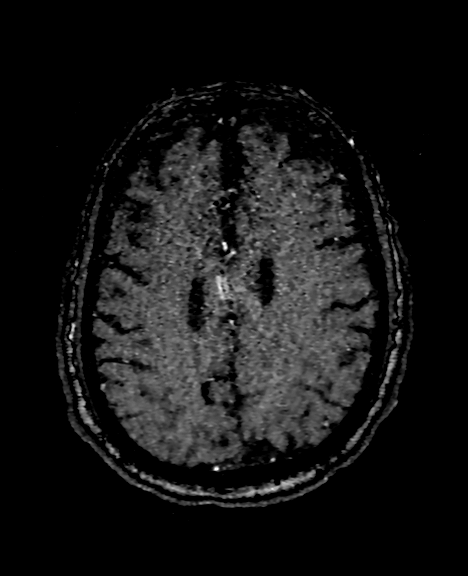

[18 of 48 positions shown; findings below may reference images not displayed]

FINDINGS: MR HEAD FINDINGS

Brain: Infarct in the right occipital lobe appears late subacute.
Hypodensity was seen in this area on the prior CT. On MRI, there is
diffusion hyperintensity and facilitated diffusion in the right
occipital lobe. T1 hyperintensity in the right occipital lobe
consistent with laminar necrosis. There is cortical volume loss. No
associated hemorrhage. There is also a small focus of infarct in the
right mid thalamus which appears of the same age.

Minimal white matter changes in the cerebral hemispheres
bilaterally. Negative for mass lesion. Chronic hemorrhage left
thalamus. Ventricle size normal. Mild cerebral atrophy.

Vascular: Normal arterial flow voids.

Skull and upper cervical spine: No focal skeletal lesion.

Sinuses/Orbits: Paranasal sinuses clear.  Negative orbit

Other: None

MRA HEAD FINDINGS

Anterior circulation: Atherosclerotic irregularity in the cavernous
carotid bilaterally. Moderate stenosis of the supraclinoid internal
carotid artery on the right. Severe stenosis left supraclinoid
internal carotid artery. Anterior and middle cerebral arteries
patent bilaterally without stenosis or occlusion.

Posterior circulation: Both vertebral arteries are patent to the
basilar. PICA patent bilaterally. Basilar widely patent. AICA and
superior cerebellar arteries patent bilaterally

Occlusion of the proximal right posterior cerebral artery P1
segment. Left posterior cerebral artery widely patent.

Anatomic variants:Negative for cerebral aneurysm.

MRA NECK FINDINGS

Aortic arch: Normal aortic arch with standard branching. Proximal
great vessels widely patent.

Right carotid system: Right carotid widely patent

Left carotid system: Left carotid widely patent

Vertebral arteries: Both vertebral arteries appear normal and widely
patent to the basilar.

Other: None
IMPRESSION: 1. Recent infarct in the right PCA territory. This involves the
right occipital lobe with a small infarct in the right thalamus.
This area shows hypodensity on the CT of [DATE] and continues to
show abnormal signal on diffusion-weighted imaging likely due to
facilitated diffusion.
2. Chronic hemorrhage in the left thalamus. Minimal chronic
microvascular ischemic change in the white matter.
3. Severe stenosis of the left supraclinoid internal carotid artery
and moderate stenosis right supraclinoid internal carotid artery.
4. Occlusion of the right P1 segment.
5. Carotid and vertebral arteries are widely patent in the neck.

## 2021-02-05 IMAGING — MR MR HEAD W/O CM
13 of 14 series · 37 of 48 positions shown · IV contrast (gadavist)
Comparison: CT head [DATE]

CLINICAL DATA: TIA.

EXAM:
MRI HEAD WITHOUT CONTRAST
MRA HEAD WITHOUT CONTRAST
MRA OF THE NECK WITHOUT AND WITH CONTRAST
TECHNIQUE: Multiplanar, multi-echo pulse sequences of the brain and surrounding
structures were acquired without intravenous contrast. Angiographic
images of the Circle of Willis were acquired using MRA technique
without intravenous contrast. Angiographic images of the neck were
acquired using MRA technique without and with intravenous contrast.
Carotid stenosis measurements (when applicable) are obtained
utilizing NASCET criteria, using the distal internal carotid
diameter as the denominator.
CONTRAST:  10mL GADAVIST GADOBUTROL 1 MMOL/ML IV SOLN

[Series 5: DWI · axial · 3.0mm · 0.77mm/px · z∈[-32,+114]mm · 3 of 50 slices shown (1 of 6)]
[im 1/50]
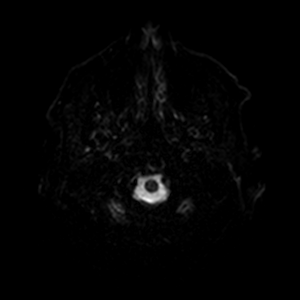
[im 25/50]
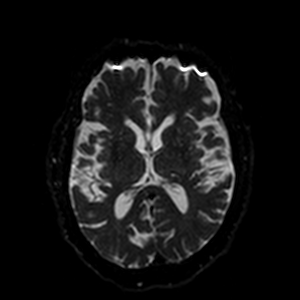
[im 50/50]
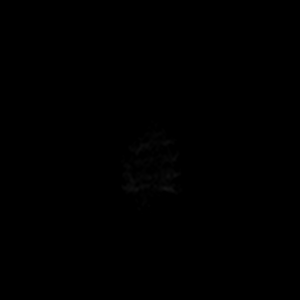

[Series 5: DWI · axial · 3.0mm · 0.77mm/px · z∈[-32,+114]mm · 3 of 50 slices shown (2 of 6)]
[im 1/50]
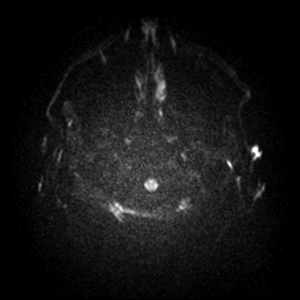
[im 25/50]
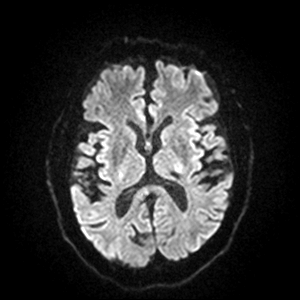
[im 50/50]
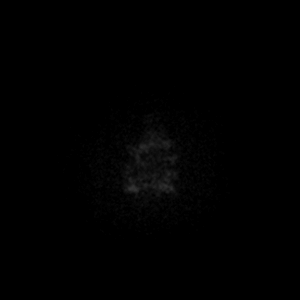

[Series 6: DWI · axial · 3.0mm · 0.77mm/px · z∈[-32,+114]mm · 4 of 50 slices shown (3 of 6)]
[im 1/50]
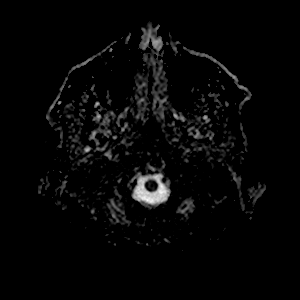
[im 17/50]
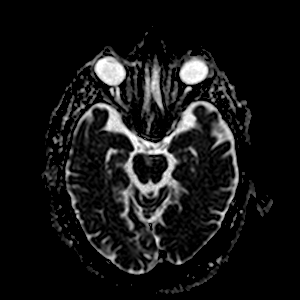
[im 33/50]
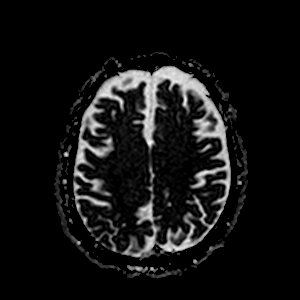
[im 50/50]
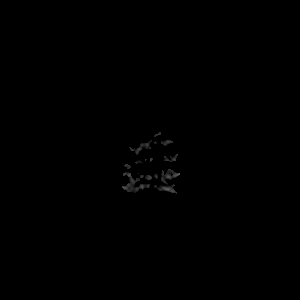

[Series 7: DWI · coronal · 5.0mm · 0.88mm/px · 2 of 29 slices shown (4 of 6)]
[im 1/29]
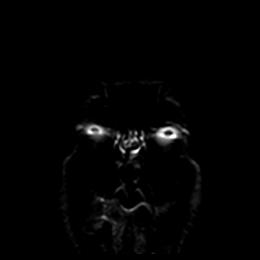
[im 29/29]
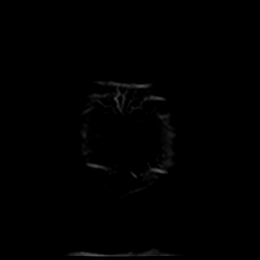

[Series 7: DWI · coronal · 5.0mm · 0.88mm/px · 2 of 29 slices shown (5 of 6)]
[im 1/29]
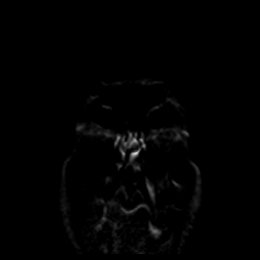
[im 29/29]
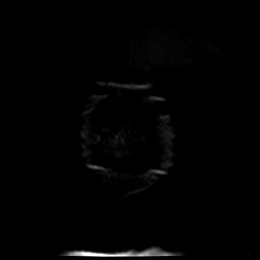

[Series 8: DWI · coronal · 5.0mm · 0.88mm/px · 2 of 29 slices shown (6 of 6)]
[im 1/29]
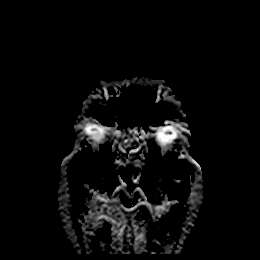
[im 29/29]
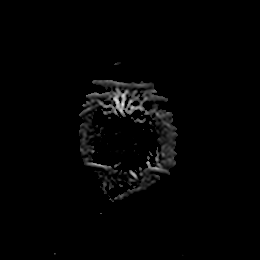

[Series 9: T1 · sagittal · 5.0mm · 0.75mm/px · 1 of 21 slices shown]
[im 1/21]
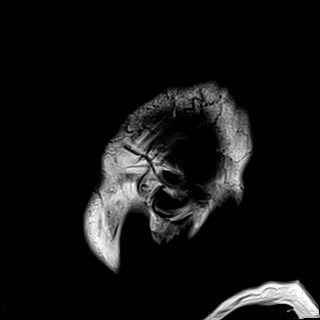

[Series 10: T2 · axial · 5.0mm · 0.72mm/px · z∈[-36,+117]mm · 2 of 23 slices shown (1 of 2)]
[im 1/23]
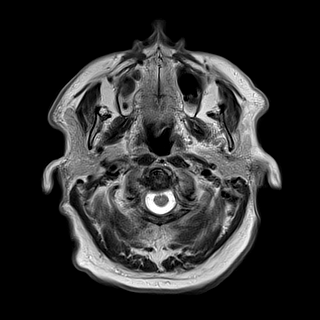
[im 23/23]
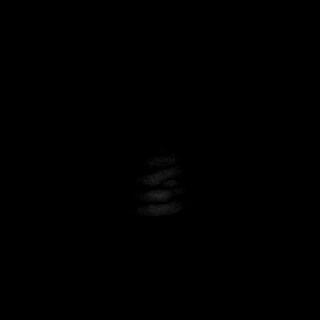

[Series 11: mag_images · axial · 3.0mm · 0.90mm/px · z∈[-35,+117]mm · 4 of 52 slices shown]
[im 1/52]
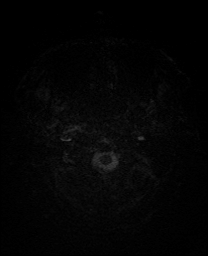
[im 18/52]
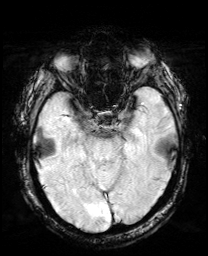
[im 35/52]
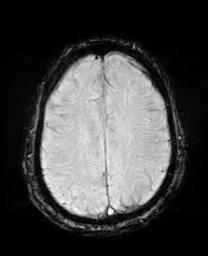
[im 52/52]
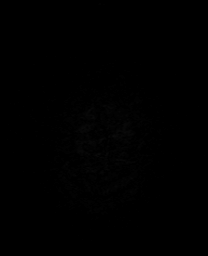

[Series 12: pha_images · axial · 3.0mm · 0.90mm/px · z∈[-35,+114]mm · 4 of 51 slices shown]
[im 1/51]
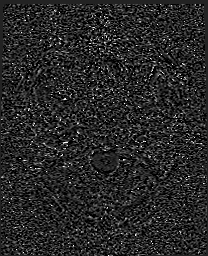
[im 17/51]
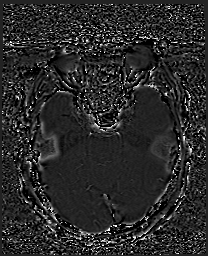
[im 34/51]
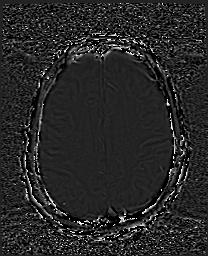
[im 51/51]
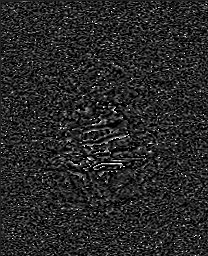

[Series 13: swi_images · axial · 3.0mm · 0.90mm/px · z∈[-35,+117]mm · 4 of 52 slices shown]
[im 1/52]
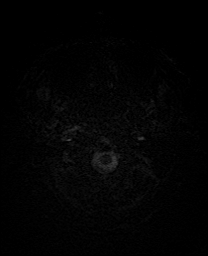
[im 18/52]
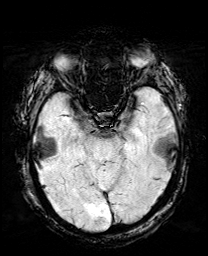
[im 35/52]
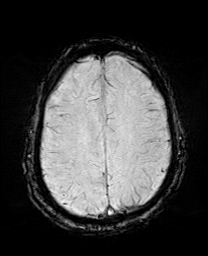
[im 52/52]
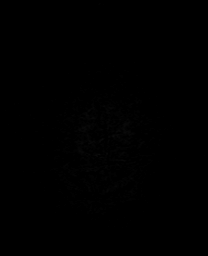

[Series 15: FLAIR · axial · 3.0mm · 0.45mm/px · z∈[-36,+117]mm · 4 of 52 slices shown]
[im 1/52]
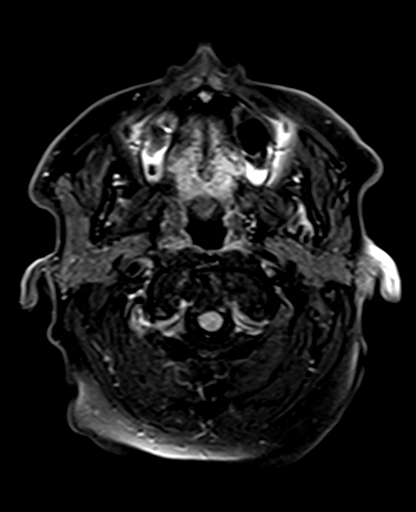
[im 18/52]
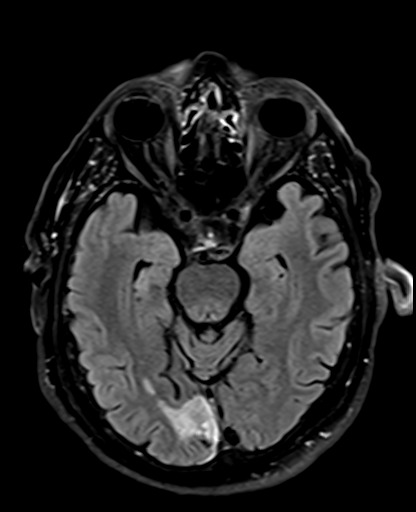
[im 35/52]
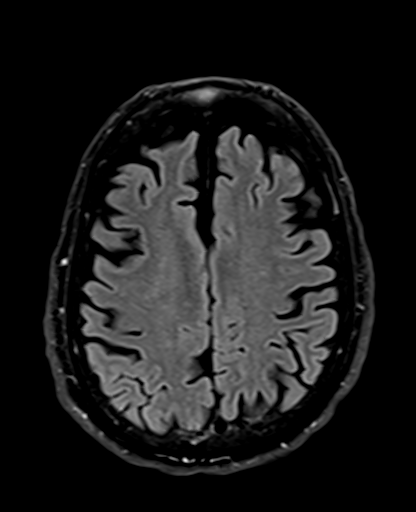
[im 52/52]
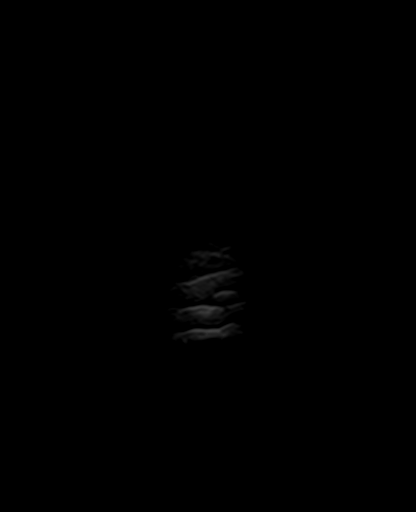

[Series 17: T2 · coronal · 5.0mm · 0.72mm/px · 2 of 29 slices shown (2 of 2)]
[im 1/29]
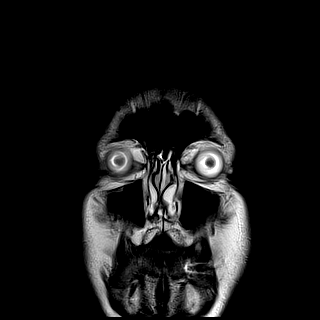
[im 29/29]
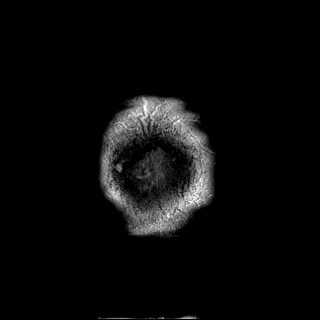

[37 of 48 positions shown; findings below may reference images not displayed]

FINDINGS: MR HEAD FINDINGS

Brain: Infarct in the right occipital lobe appears late subacute.
Hypodensity was seen in this area on the prior CT. On MRI, there is
diffusion hyperintensity and facilitated diffusion in the right
occipital lobe. T1 hyperintensity in the right occipital lobe
consistent with laminar necrosis. There is cortical volume loss. No
associated hemorrhage. There is also a small focus of infarct in the
right mid thalamus which appears of the same age.

Minimal white matter changes in the cerebral hemispheres
bilaterally. Negative for mass lesion. Chronic hemorrhage left
thalamus. Ventricle size normal. Mild cerebral atrophy.

Vascular: Normal arterial flow voids.

Skull and upper cervical spine: No focal skeletal lesion.

Sinuses/Orbits: Paranasal sinuses clear.  Negative orbit

Other: None

MRA HEAD FINDINGS

Anterior circulation: Atherosclerotic irregularity in the cavernous
carotid bilaterally. Moderate stenosis of the supraclinoid internal
carotid artery on the right. Severe stenosis left supraclinoid
internal carotid artery. Anterior and middle cerebral arteries
patent bilaterally without stenosis or occlusion.

Posterior circulation: Both vertebral arteries are patent to the
basilar. PICA patent bilaterally. Basilar widely patent. AICA and
superior cerebellar arteries patent bilaterally

Occlusion of the proximal right posterior cerebral artery P1
segment. Left posterior cerebral artery widely patent.

Anatomic variants:Negative for cerebral aneurysm.

MRA NECK FINDINGS

Aortic arch: Normal aortic arch with standard branching. Proximal
great vessels widely patent.

Right carotid system: Right carotid widely patent

Left carotid system: Left carotid widely patent

Vertebral arteries: Both vertebral arteries appear normal and widely
patent to the basilar.

Other: None
IMPRESSION: 1. Recent infarct in the right PCA territory. This involves the
right occipital lobe with a small infarct in the right thalamus.
This area shows hypodensity on the CT of [DATE] and continues to
show abnormal signal on diffusion-weighted imaging likely due to
facilitated diffusion.
2. Chronic hemorrhage in the left thalamus. Minimal chronic
microvascular ischemic change in the white matter.
3. Severe stenosis of the left supraclinoid internal carotid artery
and moderate stenosis right supraclinoid internal carotid artery.
4. Occlusion of the right P1 segment.
5. Carotid and vertebral arteries are widely patent in the neck.

## 2021-02-05 MED ORDER — GADOBUTROL 1 MMOL/ML IV SOLN
10.0000 mL | Freq: Once | INTRAVENOUS | Status: AC | PRN
  Administered 2021-02-05: 14:00:00 10 mL via INTRAVENOUS

## 2021-02-05 MED ORDER — GADOBUTROL 1 MMOL/ML IV SOLN: 10.0000 mL | Freq: Once | INTRAVENOUS | Status: DC | PRN

## 2021-02-06 ENCOUNTER — Telehealth: Payer: Self-pay | Admitting: Neurology

## 2021-02-06 NOTE — Telephone Encounter (Signed)
1. Recent infarct in the right PCA territory. This involves the right occipital lobe with a small infarct in the right thalamus. This area shows hypodensity on the CT of 01/08/2021 and continues to show abnormal signal on diffusion-weighted imaging likely due to facilitated diffusion. 2. Chronic hemorrhage in the left thalamus. Minimal chronic microvascular ischemic change in the white matter. 3. Severe stenosis of the left supraclinoid internal carotid artery and moderate stenosis right supraclinoid internal carotid artery. 4. Occlusion of the right P1 segment. 5. Carotid and vertebral arteries are widely patent in the neck.  Please call patient, MRI of the brain showed recent infarction involving right occipital lobe, and a smaller infarct in the right thalamus,  Chronic hemorrhage in the left thalamus,  Evidence of multiple intracranial atherosclerotic disease  No evidence of significant large vessel disease in the neck  Continue aspirin, 81 mg daily, make sure he was seen by urologist before next follow-up in March

## 2021-02-07 ENCOUNTER — Telehealth: Payer: Self-pay | Admitting: Neurology

## 2021-02-07 NOTE — Telephone Encounter (Signed)
I called pt's wife back and left vm asking for call back to discuss husband results.

## 2021-02-07 NOTE — Telephone Encounter (Signed)
Pt's wife returned my call and we discussed results MRI.  She verbalized understanding and will encourage pt to see urology.   1. Recent infarct in the right PCA territory. This involves the right occipital lobe with a small infarct in the right thalamus. This area shows hypodensity on the CT of 01/08/2021 and continues to show abnormal signal on diffusion-weighted imaging likely due to facilitated diffusion. 2. Chronic hemorrhage in the left thalamus. Minimal chronic microvascular ischemic change in the white matter. 3. Severe stenosis of the left supraclinoid internal carotid artery and moderate stenosis right supraclinoid internal carotid artery. 4. Occlusion of the right P1 segment. 5. Carotid and vertebral arteries are widely patent in the neck.   Please call patient, MRI of the brain showed recent infarction involving right occipital lobe, and a smaller infarct in the right thalamus,   Chronic hemorrhage in the left thalamus,  Evidence of multiple intracranial atherosclerotic disease  No evidence of significant large vessel disease in the neck  Continue aspirin, 81 mg daily, make sure he was seen by urologist before next follow-up in March.

## 2021-02-07 NOTE — Telephone Encounter (Signed)
Pt verified by name and DOB, results given per provider, pt voiced understanding all question answered. 

## 2021-02-07 NOTE — Telephone Encounter (Signed)
Pt's wife returned phone call, would like a call from the nurse.Call my cell [phone].

## 2021-02-07 NOTE — Telephone Encounter (Signed)
Pt's wife is asking for a call with the results that were given to pt. The home#[phone] cell#[phone]

## 2021-02-12 ENCOUNTER — Telehealth: Payer: Self-pay | Admitting: Cardiovascular Disease

## 2021-02-12 ENCOUNTER — Ambulatory Visit: Payer: Medicare Other | Admitting: Cardiovascular Disease

## 2021-02-12 ENCOUNTER — Encounter: Payer: Self-pay | Admitting: Cardiovascular Disease

## 2021-02-12 ENCOUNTER — Other Ambulatory Visit: Payer: Self-pay | Admitting: Family

## 2021-02-12 ENCOUNTER — Other Ambulatory Visit: Payer: Self-pay

## 2021-02-12 VITALS — BP 128/86 | HR 89 | Ht 70.0 in | Wt 196.6 lb

## 2021-02-12 DIAGNOSIS — E785 Hyperlipidemia, unspecified: Secondary | ICD-10-CM | POA: Diagnosis not present

## 2021-02-12 DIAGNOSIS — I10 Essential (primary) hypertension: Secondary | ICD-10-CM

## 2021-02-12 DIAGNOSIS — Z720 Tobacco use: Secondary | ICD-10-CM | POA: Diagnosis not present

## 2021-02-12 DIAGNOSIS — I482 Chronic atrial fibrillation, unspecified: Secondary | ICD-10-CM | POA: Diagnosis not present

## 2021-02-12 MED ORDER — DILTIAZEM HCL ER COATED BEADS 300 MG PO CP24: 300.0000 mg | ORAL_CAPSULE | Freq: Every day | ORAL | 0 refills | Status: DC

## 2021-02-12 MED ORDER — ATORVASTATIN CALCIUM 20 MG PO TABS: 20.0000 mg | ORAL_TABLET | Freq: Every day | ORAL | 3 refills | Status: DC

## 2021-02-12 MED ORDER — APIXABAN 5 MG PO TABS: 5.0000 mg | ORAL_TABLET | Freq: Two times a day (BID) | ORAL | 3 refills | Status: DC

## 2021-02-12 NOTE — Progress Notes (Signed)
Cardiology Office Note   Date:  02/12/2021   ID:  Jacob Sanders, DOB 02-Dec-1945, MRN [id number]  PCP:  Junie Spencer, FNP  Cardiologist:   Lorine Bears, MD   No chief complaint on file.     History of Present Illness: Jacob Sanders is a 76 y.o. male who is here today for follow-up visit regarding chronic atrial fibrillation. He has known history of chronic atrial fibrillation, essential hypertension and prolonged history of tobacco use. He had ABI done in 2017 for leg pain which was normal with no evidence of PAD.  He had nuclear stress testing in 2018 for exertional dyspnea and abnormal EKG that was normal. The patient had previous bilateral hip replacement.  He used to be on anticoagulation with warfarin but he reports that it was stopped few years ago due to hematuria.  He continues to smoke half a pack per day.  He had atypical chest pain last year and was evaluated with a Lexiscan Myoview which showed no evidence of ischemia.  Echocardiogram in July 2022 showed normal LV systolic function with severe biatrial enlargement, mild mitral regurgitation and mildly dilated ascending aorta at 37 mm.  The patient declined anticoagulation in the past.  He suffered from a stroke in December and was seen by neurology.  Fortunately, he has no residual neurologic symptoms.  He denies chest pain or shortness of breath. He cut down on tobacco use 5 cigarettes daily.    Past Medical History:  Diagnosis Date   Arthritis    Atrial fibrillation (HCC)    Dysrhythmia    afib   GERD (gastroesophageal reflux disease)    History of kidney stones    passed   Hyperlipidemia    Hypertension    Nerve damage    left hand after accident- 1995   Seasonal allergies    Staph infection    post back surgeries 1998 and1999    Past Surgical History:  Procedure Laterality Date   BACK SURGERY  [id number]   discectomy   HAND SURGERY Left 1995   OPEN REDUCTION INTERNAL FIXATION (ORIF)  DISTAL RADIAL FRACTURE Left 07/29/2019   Procedure: OPEN REDUCTION INTERNAL FIXATION (ORIF) LEFT DISTAL RADIAL FRACTURE;  Surgeon: Dominica Severin, MD;  Location: [facility] OR;  Service: Orthopedics;  Laterality: Left;  90 mins   TONSILLECTOMY     childhood    TOOTH EXTRACTION  3 weeks ago ;   had abcess beneath tooth; experiened some bleeding for 20 hours post op   TOTAL HIP ARTHROPLASTY Left 05/22/2016   Procedure: LEFT TOTAL HIP ARTHROPLASTY ANTERIOR APPROACH;  Surgeon: Samson Frederic, MD;  Location: [facility] ORS;  Service: Orthopedics;  Laterality: Left;  Dr. requesting RNFA   TOTAL HIP ARTHROPLASTY Right 07/17/2016   Procedure: RIGHT TOTAL HIP ARTHROPLASTY ANTERIOR APPROACH;  Surgeon: Samson Frederic, MD;  Location: [facility] ORS;  Service: Orthopedics;  Laterality: Right;  needs RNFA     Current Outpatient Medications  Medication Sig Dispense Refill   aspirin EC 81 MG EC tablet Take 1 tablet (81 mg total) by mouth daily. 90 tablet 3   diltiazem (CARDIZEM CD) 300 MG 24 hr capsule Take 1 capsule (300 mg total) by mouth daily. 90 capsule 1   meclizine (ANTIVERT) 25 MG tablet TAKE 1 TABLET BY MOUTH 3 TIMES  DAILY AS NEEDED FOR DIZZINESS 180 tablet 0   omeprazole (PRILOSEC) 20 MG capsule TAKE 1 CAPSULE BY MOUTH  DAILY AS NEEDED 90 capsule 0   simvastatin (ZOCOR)  10 MG tablet Take 1 tablet (10 mg total) by mouth daily. 30 tablet 11   Budeson-Glycopyrrol-Formoterol (BREZTRI AEROSPHERE) 160-9-4.8 MCG/ACT AERO Inhale 2 puffs into the lungs 2 (two) times daily. (Patient not taking: Reported on 02/12/2021) 10.7 g 11   busPIRone (BUSPAR) 5 MG tablet Take 1 tablet (5 mg total) by mouth 2 (two) times daily. Dx. F41.1 (Patient not taking: Reported on 02/12/2021) 180 tablet 1   dextromethorphan-guaiFENesin (MUCINEX DM) 30-600 MG 12hr tablet Take 1 tablet by mouth 2 (two) times daily. (Patient not taking: Reported on 02/12/2021) 30 tablet 1   sodium chloride (OCEAN) 0.65 % SOLN nasal spray Place 1 spray into both nostrils as  needed for congestion. (Patient not taking: Reported on 02/12/2021) 60 mL 0   No current facility-administered medications for this visit.    Allergies:   Warfarin and related, Morphine and related, and Prednisone    Social History:  The patient  reports that he has been smoking cigars and cigarettes. He has a 45.00 pack-year smoking history. He has never used smokeless tobacco. He reports that he does not drink alcohol and does not use drugs.   Family History:  The patient's family history includes Aneurysm in his father; Hypertension in his father and mother.    ROS:  Please see the history of present illness.   Otherwise, review of systems are positive for none.   All other systems are reviewed and negative.    PHYSICAL EXAM: VS:  BP 128/86 (BP Location: Left Arm, Patient Position: Sitting, Cuff Size: Large)    Pulse 89    Ht 5\' 10"  (1.778 m)    Wt 196 lb 9.6 oz (89.2 kg)    SpO2 97%    BMI 28.21 kg/m  , BMI Body mass index is 28.21 kg/m. GEN: Well nourished, well developed, in no acute distress  HEENT: normal  Neck: no JVD, carotid bruits, or masses Cardiac: ; Irregularly irregular no murmurs, rubs, or gallops,no edema  Respiratory:  clear to auscultation bilaterally, normal work of breathing GI: soft, nontender, nondistended, + BS MS: no deformity or atrophy  Skin: warm and dry, no rash Neuro:  Strength and sensation are intact Psych: euthymic mood, full affect  EKG:  EKG is not ordered today.    Recent Labs: 01/08/2021: ALT 11; BUN 20; Creatinine, Ser 1.06; Potassium 4.7; Sodium 139 01/10/2021: Hemoglobin 16.8; Platelets 266; TSH 2.230    Lipid Panel    Component Value Date/Time   CHOL 205 (H) 01/10/2021 0952   TRIG 137 01/10/2021 0952   HDL 52 01/10/2021 0952   CHOLHDL 3.9 01/10/2021 0952   LDLCALC 129 (H) 01/10/2021 0952      Wt Readings from Last 3 Encounters:  02/12/21 196 lb 9.6 oz (89.2 kg)  01/10/21 182 lb 8 oz (82.8 kg)  01/08/21 191 lb (86.6 kg)       PAD Screen 12/18/2015  Previous PAD dx? No  Previous surgical procedure? No  Pain with walking? Yes  Subsides with rest? No  Feet/toe relief with dangling? No  Painful, non-healing ulcers? No  Extremities discolored? No      ASSESSMENT AND PLAN:   1.  Atypical chest pain: No recurrent symptoms and negative Lexiscan Myoview in September 2022.  2. Chronic atrial fibrillation: Ventricular rate is controlled on diltiazem.  Given his recent stroke in December, I had a prolonged discussion with him about the importance of anticoagulation.  He continues to be hesitant due to cost.  His CHA2DS2-VASc score  now is 4.   I recommend initiating anticoagulation with Eliquis 5 mg twice daily and stopping aspirin.  If this medication is not well covered by his insurance, we will plan on applying for medication assistance.  3. Essential hypertension: Blood pressure is well controlled today.  4. Tobacco use: He cut down on tobacco use and is trying to quit.  5.  Hyperlipidemia: He needs potent statin to replace simvastatin especially that he is on diltiazem.  I elected to switch him to atorvastatin 20 mg daily.  Repeat lipid and liver profile in 3 months.   Disposition:   FU with me in 6 months.   Signed,  Kathlyn Sacramento, MD  02/12/2021 8:18 AM    [place]

## 2021-02-12 NOTE — Telephone Encounter (Signed)
Pt c/o medication issue:  1. Name of Medication: apixaban (ELIQUIS) 5 MG TABS tablet  2. How are you currently taking this medication (dosage and times per day)? Has not started medication yet  3. Are you having a reaction (difficulty breathing--STAT)? no  4. What is your medication issue? Patient's wife calling to request help trying to get a grant for the medication. She also says if the grant does not work out, the next time it is refilled to only send 30 day supplies to [facility].

## 2021-02-12 NOTE — Telephone Encounter (Signed)
Spoke with the patient's wife. She stated that they will need to apply for assistance for the Eliquis. The paperwork has been left up front for him.

## 2021-02-12 NOTE — Patient Instructions (Addendum)
Medication Instructions:  STOP the Aspirin STOP the Simvastatin  START Eliquis 5 mg twice daily START Atorvastatin 20 mg once daily  *If you need a refill on your cardiac medications before your next appointment, please call your pharmacy*   Lab Work: Your provider would like for you to return in 3 months to have the following labs drawn: Fasting Lipid, CMET and CBC . You do not need an appointment for the lab. Once in our office lobby there is a podium where you can sign in and ring the doorbell to alert [place] that you are here. The lab is open from 8:00 am to 4:30 pm; closed for lunch from 12:45pm-1:45pm.  If you have labs (blood work) drawn today and your tests are completely normal, you will receive your results only by: [place] (if you have MyChart) OR A paper copy in the mail If you have any lab test that is abnormal or we need to change your treatment, we will call you to review the results.   Testing/Procedures: None ordered   Follow-Up: At [facility], you and your health needs are our priority.  As part of our continuing mission to provide you with exceptional heart care, we have created designated Provider Care Teams.  These Care Teams include your primary Cardiologist (physician) and Advanced Practice Providers (APPs -  Physician Assistants and Nurse Practitioners) who all work together to provide you with the care you need, when you need it.  We recommend signing up for the patient portal called "MyChart".  Sign up information is provided on this After Visit Summary.  MyChart is used to connect with patients for Virtual Visits (Telemedicine).  Patients are able to view lab/test results, encounter notes, upcoming appointments, etc.  Non-urgent messages can be sent to your provider as well.   To learn more about what you can do with MyChart, go to [email or web address].    Your next appointment:   6 month(s)  The format for your next appointment:   In  Person  Provider:   Kathlyn Sacramento, MD

## 2021-02-13 ENCOUNTER — Telehealth: Payer: Self-pay | Admitting: Cardiovascular Disease

## 2021-02-13 ENCOUNTER — Other Ambulatory Visit: Payer: Self-pay

## 2021-02-13 DIAGNOSIS — I10 Essential (primary) hypertension: Secondary | ICD-10-CM

## 2021-02-13 MED ORDER — DILTIAZEM HCL ER COATED BEADS 300 MG PO CP24: 300.0000 mg | ORAL_CAPSULE | Freq: Every day | ORAL | 0 refills | Status: DC

## 2021-02-13 MED ORDER — ATORVASTATIN CALCIUM 20 MG PO TABS: 20.0000 mg | ORAL_TABLET | Freq: Every day | ORAL | 3 refills | Status: DC

## 2021-02-13 NOTE — Telephone Encounter (Signed)
Called patient spoke with wife- advised that it was okay per [facility].  Sent RX to pharmacy to refill.

## 2021-02-13 NOTE — Telephone Encounter (Signed)
Diltiazem increases concentrations of atorvastatin. This is fine, pt is only taking atorvastatin 20mg  daily. Pharmacy should be advised to fill both prescriptions.

## 2021-02-13 NOTE — Telephone Encounter (Signed)
Pt c/o medication issue:  1. Name of Medication: atorvastatin (LIPITOR) 20 MG tablet,   diltiazem (CARDIZEM CD) 300 MG 24 hr capsule  2. How are you currently taking this medication (dosage and times per day)? N/a  3. Are you having a reaction (difficulty breathing--STAT)? no  4. What is your medication issue? Pharmacy is refusing to refill the atorvastatin due to a reaction between the two... please advise further

## 2021-02-18 ENCOUNTER — Telehealth: Payer: Self-pay | Admitting: Dermatopathology

## 2021-02-18 NOTE — Telephone Encounter (Signed)
Called patient spoke with wife- advised of message below.  Patient wife verbalized understanding.

## 2021-02-18 NOTE — Telephone Encounter (Signed)
Spoke to the pharmacy and they had the medication for Atorvastatin (Lipitor) 20 MG on hold due to an medication interaction. But Dr said it was Okay to refill. So, [facility] will refill medication for patient and will call patient to let him know.

## 2021-02-18 NOTE — Telephone Encounter (Signed)
°*  STAT* If patient is at the pharmacy, call can be transferred to refill team.   1. Which medications need to be refilled? (please list name of each medication and dose if known) new prescription for Atorvastatin  2. Which pharmacy/location (including street and city if local pharmacy) is medication to be sent to? [facility]  3. Do they need a 30 day or 90 day supply? 90 days and refills

## 2021-02-18 NOTE — Telephone Encounter (Signed)
Simvastatin decreases concentrations of his diltiazem and diltiazem increases concentrations of his simvastatin. Would just have him stop the simvastatin and wait for atorvastatin to come in and start taking. Rx was sent in 5 days ago so should arrive within the next few days.

## 2021-02-18 NOTE — Telephone Encounter (Signed)
Called patient- he states he has not received his Atorvastatin yet from [facility]. He advised that he has been taking his Simvastatin until he can get his Atorvastatin in. I did advise I sent it in on 02/01- they are going to contact [facility] and see is happening with the RX.  I advised I would route to [facility] to make sure he is okay to continue Simvastatin until he gets his Atorvastatin in.   Also, they stated he went for a teeth cleaning- and they brought a form here to be filled out, advised I would check with Dr.Arida's nurse on this and would call back.

## 2021-02-18 NOTE — Telephone Encounter (Signed)
New Message:    Patient's wife wants to know if the patient needs to continue taking his Simvastatin until he gets his Atorvastatin?

## 2021-02-19 ENCOUNTER — Telehealth: Payer: Self-pay | Admitting: Cardiovascular Disease

## 2021-02-19 NOTE — Telephone Encounter (Signed)
Do not see any active clearances. Needs more info.

## 2021-02-19 NOTE — Telephone Encounter (Signed)
Pt is reaching out stating that she filled out a clearance form to the attention of Misty Stanley in order to have her husbands teeth cleaned and she hasnt heard back... please further advise

## 2021-02-20 NOTE — Telephone Encounter (Signed)
Contacted the patients wife to inform her that we do not have any information for a cardiac clearance. The patients wife stated the [facility] is [facility] [address] for a cleaning.   I contacted [facility] and spoke with Diane. I requested a cardiac clearance but was informed that they do not have a fax. She stated that she could email me the clearance.   Email given and clearance was received.   Clearance can not be entered until more information is provided.   Left message for someone from their office to give more information about clearance that is needed.

## 2021-02-21 ENCOUNTER — Encounter: Payer: Self-pay | Admitting: Family

## 2021-02-21 ENCOUNTER — Ambulatory Visit (INDEPENDENT_AMBULATORY_CARE_PROVIDER_SITE_OTHER): Payer: Medicare Other | Admitting: Family

## 2021-02-21 VITALS — BP 125/84 | HR 102 | Temp 97.4°F | Ht 70.0 in | Wt 199.0 lb

## 2021-02-21 DIAGNOSIS — I4891 Unspecified atrial fibrillation: Secondary | ICD-10-CM | POA: Diagnosis not present

## 2021-02-21 DIAGNOSIS — F17200 Nicotine dependence, unspecified, uncomplicated: Secondary | ICD-10-CM | POA: Diagnosis not present

## 2021-02-21 DIAGNOSIS — J441 Chronic obstructive pulmonary disease with (acute) exacerbation: Secondary | ICD-10-CM

## 2021-02-21 MED ORDER — DOXYCYCLINE HYCLATE 100 MG PO TABS: 100.0000 mg | ORAL_TABLET | Freq: Two times a day (BID) | ORAL | 0 refills | Status: DC

## 2021-02-21 MED ORDER — METHYLPREDNISOLONE 4 MG PO TBPK: ORAL_TABLET | ORAL | 0 refills | Status: DC

## 2021-02-21 MED ORDER — BREZTRI AEROSPHERE 160-9-4.8 MCG/ACT IN AERO: 2.0000 | INHALATION_SPRAY | Freq: Two times a day (BID) | RESPIRATORY_TRACT | 11 refills | Status: DC

## 2021-02-21 NOTE — Progress Notes (Signed)
Subjective:    Patient ID: Jacob Sanders, male    DOB: 1945-09-19, 76 y.o.   MRN: [id number]  Chief Complaint  Patient presents with   Cough   Nasal Congestion   PT presents to the office today with cough that started 5-7 days. He took a COVID test yesterday that was negative. He has COPD and takes Breztri as needed. He continues to smoke 4-5 cigarettes a day.   He saw her Cardiologists on 02/12/21 and started him on Eliquis 5 mg and Liptior 20 mg. He reports mild bruising, but doing well.  Cough This is a recurrent problem. The current episode started 1 to 4 weeks ago. The problem has been gradually worsening. The problem occurs every few minutes. The cough is Productive of sputum. Associated symptoms include chills, myalgias, nasal congestion, postnasal drip, rhinorrhea, shortness of breath and wheezing. Pertinent negatives include no ear congestion, ear pain, fever, headaches or sore throat. Risk factors for lung disease include smoking/tobacco exposure. He has tried rest and OTC cough suppressant for the symptoms. The treatment provided mild relief. His past medical history is significant for COPD.     Review of Systems  Constitutional:  Positive for chills. Negative for fever.  HENT:  Positive for postnasal drip and rhinorrhea. Negative for ear pain and sore throat.   Respiratory:  Positive for cough, shortness of breath and wheezing.   Musculoskeletal:  Positive for myalgias.  Neurological:  Negative for headaches.  All other systems reviewed and are negative.     Objective:   Physical Exam Vitals reviewed.  Constitutional:      General: He is not in acute distress.    Appearance: He is well-developed.  HENT:     Head: Normocephalic.     Right Ear: Tympanic membrane normal.     Left Ear: Tympanic membrane normal.  Eyes:     General:        Right eye: No discharge.        Left eye: No discharge.     Pupils: Pupils are equal, round, and reactive to light.  Neck:      Thyroid: No thyromegaly.  Cardiovascular:     Rate and Rhythm: Normal rate and regular rhythm.     Heart sounds: Normal heart sounds. No murmur heard. Pulmonary:     Effort: Pulmonary effort is normal. No respiratory distress.     Breath sounds: Rhonchi present. No wheezing.  Abdominal:     General: Bowel sounds are normal. There is no distension.     Palpations: Abdomen is soft.     Tenderness: There is no abdominal tenderness.  Musculoskeletal:        General: No tenderness. Normal range of motion.     Cervical back: Normal range of motion and neck supple.  Skin:    General: Skin is warm and dry.     Findings: No erythema or rash.  Neurological:     Mental Status: He is alert and oriented to person, place, and time.     Cranial Nerves: No cranial nerve deficit.     Deep Tendon Reflexes: Reflexes are normal and symmetric.  Psychiatric:        Behavior: Behavior normal.        Thought Content: Thought content normal.        Judgment: Judgment normal.    BP 125/84    Pulse (!) 102    Temp (!) 97.4 F (36.3 C) (Oral)    Ht  5\' 10"  (1.778 m)    Wt 199 lb (90.3 kg)    SpO2 98%    BMI 28.55 kg/m       Assessment & Plan:  TYLAND KLEMENS comes in today with chief complaint of Cough and Nasal Congestion   Diagnosis and orders addressed:  1. COPD exacerbation (HCC) Start doxycycline and medrol  Start Breztri BID  Smoking cessation  - Budeson-Glycopyrrol-Formoterol (BREZTRI AEROSPHERE) 160-9-4.8 MCG/ACT AERO; Inhale 2 puffs into the lungs 2 (two) times daily.  Dispense: 10.7 g; Refill: 11 - doxycycline (VIBRA-TABS) 100 MG tablet; Take 1 tablet (100 mg total) by mouth 2 (two) times daily.  Dispense: 20 tablet; Refill: 0 - methylPREDNISolone (MEDROL DOSEPAK) 4 MG TBPK tablet; Use as directed  Dispense: 21 each; Refill: 0  2. Atrial fibrillation, unspecified type (HCC) Continue Eliquis  Education provided   3. Current smoker Smoking cessation discussed    [name],  FNP

## 2021-02-21 NOTE — Patient Instructions (Signed)

## 2021-02-21 NOTE — Telephone Encounter (Signed)
Left very detailed message for the dental office that we are needing clarification on procedure to be done, as well as other missing information needed for pre op clearance assessment. Left message to please fax over a new complete clearance request to fax # [phone] attn: Pre op Team. Need clarification of procedure, need anesthesia being used if any, need to know if requesting any meds to be held, need name of dentist doing the procedure.

## 2021-02-21 NOTE — Telephone Encounter (Signed)
° °  Pre-operative Risk Assessment    Patient Name: Jacob Sanders  DOB: 1946-01-02 MRN: [id number]     Request for Surgical Clearance    Procedure:   Prophylaxis cleaning, exam and xrays  Date of Surgery:  Clearance TBD                                 Surgeon:  Not listed Surgeon's Group or Practice Name:  [facility] Phone number:  [phone] Fax number:  None   Type of Clearance Requested:   - Medical    Type of Anesthesia:  Local    Additional requests/questions:   Pt is currently on Eliquis but they are not requesting it to be held.   Signed, Hayleen Clinkscales   02/21/2021, 3:56 PM

## 2021-02-22 NOTE — Telephone Encounter (Signed)
Pt is having a dental cleaning and exam, no teeth extractions

## 2021-02-25 NOTE — Telephone Encounter (Signed)
Hi Tere, this is the email address  [email or web address]

## 2021-02-25 NOTE — Telephone Encounter (Signed)
Website [email or web address]

## 2021-02-25 NOTE — Telephone Encounter (Signed)
We will confirm this with dental office before sending

## 2021-02-25 NOTE — Telephone Encounter (Addendum)
° °  Patient Name: Jacob Sanders  DOB: 10-14-1945 MRN: [id number]  Primary Cardiologist: Lorine Bears, MD  Chart reviewed as part of pre-operative protocol coverage.   Simple dental cleanings are considered low risk procedures per guidelines and generally do not require any specific cardiac clearance. It is also generally accepted that for simple extractions and dental cleanings, there is no need to interrupt blood thinner therapy.  SBE prophylaxis is not required for the patient from a cardiac standpoint.  I will route this recommendation to callback to route to requesting office (no fax # listed below). Thank you!   Laurann Montana, PA-C 02/25/2021, 8:36 AM

## 2021-02-25 NOTE — Telephone Encounter (Signed)
Left message on requesting providers voicemail requesting their email address to forward over a copy of the cardiac clearance letter.

## 2021-02-28 ENCOUNTER — Ambulatory Visit: Payer: Medicare Other | Admitting: Family

## 2021-03-19 ENCOUNTER — Other Ambulatory Visit: Payer: Self-pay | Admitting: Family

## 2021-03-20 ENCOUNTER — Other Ambulatory Visit: Payer: Self-pay | Admitting: *Deleted

## 2021-03-20 DIAGNOSIS — F411 Generalized anxiety disorder: Secondary | ICD-10-CM

## 2021-03-20 MED ORDER — BUSPIRONE HCL 5 MG PO TABS: 5.0000 mg | ORAL_TABLET | Freq: Two times a day (BID) | ORAL | 0 refills | Status: DC

## 2021-03-26 ENCOUNTER — Ambulatory Visit (INDEPENDENT_AMBULATORY_CARE_PROVIDER_SITE_OTHER): Payer: Medicare Other | Admitting: Family

## 2021-03-26 ENCOUNTER — Encounter: Payer: Self-pay | Admitting: Family

## 2021-03-26 VITALS — BP 139/84 | HR 80 | Temp 97.8°F | Ht 70.0 in | Wt 198.8 lb

## 2021-03-26 DIAGNOSIS — I10 Essential (primary) hypertension: Secondary | ICD-10-CM | POA: Diagnosis not present

## 2021-03-26 DIAGNOSIS — E785 Hyperlipidemia, unspecified: Secondary | ICD-10-CM | POA: Diagnosis not present

## 2021-03-26 DIAGNOSIS — G8929 Other chronic pain: Secondary | ICD-10-CM

## 2021-03-26 DIAGNOSIS — M545 Low back pain, unspecified: Secondary | ICD-10-CM

## 2021-03-26 DIAGNOSIS — K219 Gastro-esophageal reflux disease without esophagitis: Secondary | ICD-10-CM | POA: Diagnosis not present

## 2021-03-26 DIAGNOSIS — F17200 Nicotine dependence, unspecified, uncomplicated: Secondary | ICD-10-CM | POA: Diagnosis not present

## 2021-03-26 DIAGNOSIS — R300 Dysuria: Secondary | ICD-10-CM | POA: Diagnosis not present

## 2021-03-26 DIAGNOSIS — I4891 Unspecified atrial fibrillation: Secondary | ICD-10-CM

## 2021-03-26 DIAGNOSIS — M1990 Unspecified osteoarthritis, unspecified site: Secondary | ICD-10-CM | POA: Diagnosis not present

## 2021-03-26 DIAGNOSIS — E663 Overweight: Secondary | ICD-10-CM

## 2021-03-26 DIAGNOSIS — F411 Generalized anxiety disorder: Secondary | ICD-10-CM

## 2021-03-26 LAB — URINALYSIS, COMPLETE
Bilirubin, UA: NEGATIVE
Glucose, UA: NEGATIVE
Leukocytes,UA: NEGATIVE
Nitrite, UA: NEGATIVE
Specific Gravity, UA: >1.03 — ABNORMAL HIGH (ref 1.005–1.030)
Urobilinogen, Ur: 1 mg/dL (ref 0.2–1.0)
pH, UA: 5 (ref 5.0–7.5)

## 2021-03-26 LAB — MICROSCOPIC EXAMINATION
Bacteria, UA: NONE SEEN
Renal Epithel, UA: NONE SEEN /hpf
WBC, UA: NONE SEEN /hpf (ref 0–5)

## 2021-03-26 NOTE — Patient Instructions (Signed)

## 2021-03-26 NOTE — Progress Notes (Signed)
? ?Subjective:  ? ? Patient ID: Jacob Sanders, male    DOB: [birth year], 76 y.o.   MRN: [id number] ? ?Chief Complaint  ?Patient presents with  ? Medical Management of Chronic Issues  ? ?Pt calls the office today for chronic follow up.  He see's the [place] once a year.  ?  ?He is followed Cardiologists every 3 months for A Fib.  ?Hypertension ?This is a chronic problem. The current episode started more than 1 year ago. The problem has been waxing and waning since onset. Associated symptoms include anxiety and malaise/fatigue. Pertinent negatives include no peripheral edema or shortness of breath. Risk factors for coronary artery disease include dyslipidemia, obesity, male gender and smoking/tobacco exposure. The current treatment provides moderate improvement.  ?Gastroesophageal Reflux ?He complains of belching and heartburn. This is a chronic problem. The current episode started more than 1 year ago. The problem occurs occasionally. Risk factors include obesity. He has tried a PPI for the symptoms. The treatment provided moderate relief.  ?Arthritis ?Presents for follow-up visit. He complains of pain and stiffness. The symptoms have been stable. Affected locations include the right knee, left knee, right hip, left hip, left shoulder and right shoulder. His pain is at a severity of 2/10. Associated symptoms include dysuria.  ?Hyperlipidemia ?This is a chronic problem. The current episode started more than 1 year ago. The problem is controlled. Recent lipid tests were reviewed and are normal. Exacerbating diseases include obesity. Pertinent negatives include no shortness of breath. Current antihyperlipidemic treatment includes statins. The current treatment provides moderate improvement of lipids. Risk factors for coronary artery disease include dyslipidemia, male sex, hypertension and a sedentary lifestyle.  ?Anxiety ?Presents for follow-up visit. Symptoms include depressed mood, irritability and nervous/anxious  behavior. Patient reports no shortness of breath.  ? ? ?Nicotine Dependence ?Presents for follow-up visit. Symptoms include irritability. His urge triggers include company of smokers. The symptoms have been stable. He smokes < 1/2 a pack of cigarettes per day.  ?Back Pain ?This is a chronic problem. The current episode started more than 1 year ago. The problem occurs intermittently. The problem has been resolved since onset. The pain is present in the lumbar spine. The patient is experiencing no pain. Associated symptoms include dysuria.  ?Dysuria  ?This is a new problem. The current episode started 1 to 4 weeks ago. The problem occurs intermittently. The problem has been waxing and waning. The quality of the pain is described as burning. The pain is mild.  ? ? ? ?Review of Systems  ?Constitutional:  Positive for irritability and malaise/fatigue.  ?Respiratory:  Negative for shortness of breath.   ?Gastrointestinal:  Positive for heartburn.  ?Genitourinary:  Positive for dysuria.  ?Musculoskeletal:  Positive for arthritis, back pain and stiffness.  ?Psychiatric/Behavioral:  The patient is nervous/anxious.   ?All other systems reviewed and are negative. ? ?   ?Objective:  ? Physical Exam ?Vitals reviewed.  ?Constitutional:   ?   General: He is not in acute distress. ?   Appearance: He is well-developed. He is obese.  ?HENT:  ?   Head: Normocephalic.  ?   Right Ear: Tympanic membrane normal.  ?   Left Ear: Tympanic membrane normal.  ?Eyes:  ?   General:     ?   Right eye: No discharge.     ?   Left eye: No discharge.  ?   Pupils: Pupils are equal, round, and reactive to light.  ?Neck:  ?   Thyroid:  No thyromegaly.  ?Cardiovascular:  ?   Rate and Rhythm: Normal rate and regular rhythm.  ?   Heart sounds: Normal heart sounds. No murmur heard. ?Pulmonary:  ?   Effort: Pulmonary effort is normal. No respiratory distress.  ?   Breath sounds: Decreased breath sounds present. No wheezing.  ?Abdominal:  ?   General: Bowel  sounds are normal. There is no distension.  ?   Palpations: Abdomen is soft.  ?   Tenderness: There is no abdominal tenderness.  ?Musculoskeletal:     ?   General: No tenderness. Normal range of motion.  ?   Cervical back: Normal range of motion and neck supple.  ?Skin: ?   General: Skin is warm and dry.  ?   Findings: No erythema or rash.  ?Neurological:  ?   Mental Status: He is alert and oriented to person, place, and time.  ?   Cranial Nerves: No cranial nerve deficit.  ?   Deep Tendon Reflexes: Reflexes are normal and symmetric.  ?Psychiatric:     ?   Behavior: Behavior normal.     ?   Thought Content: Thought content normal.     ?   Judgment: Judgment normal.  ? ? ? ? ?BP 139/84   Pulse 80   Temp 97.8 ?F (36.6 ?C) (Temporal)   Ht 5' 10"  (1.778 m)   Wt 198 lb 12.8 oz (90.2 kg)   BMI 28.52 kg/m?  ? ?   ?Assessment & Plan:  ?Jacob Sanders comes in today with chief complaint of Medical Management of Chronic Issues ? ? ?Diagnosis and orders addressed: ? ?1. Primary hypertension ?At goal after recheck ?-Dash diet information given ?-Exercise encouraged ?- Stress Management  ?-Continue current meds ?-RTO in 3 months  ?- CMP14+EGFR ?- CBC with Differential/Platelet ? ?2. Atrial fibrillation, unspecified type ([place]) ?- CMP14+EGFR ?- CBC with Differential/Platelet ? ?3. Gastroesophageal reflux disease, unspecified whether esophagitis present ?- CMP14+EGFR ?- CBC with Differential/Platelet ? ?4. Arthritis ?- CMP14+EGFR ?- CBC with Differential/Platelet ? ?5. Hyperlipidemia, unspecified hyperlipidemia type ?- CMP14+EGFR ?- CBC with Differential/Platelet ? ?6. Current smoker ?- CMP14+EGFR ?- CBC with Differential/Platelet ? ?7. Overweight (BMI 25.0-29.9) ?- CMP14+EGFR ?- CBC with Differential/Platelet ? ?8. GAD (generalized anxiety disorder) ?- CMP14+EGFR ?- CBC with Differential/Platelet ? ?9. Chronic midline low back pain without sciatica ?- CMP14+EGFR ?- CBC with Differential/Platelet ? ?10. Dysuria ?- Urinalysis,  Complete ? ? ?Labs pending ?Health Maintenance reviewed ?Diet and exercise encouraged ? ?Follow up plan: ?3 months  ? ?Evelina Dun, FNP ? ? ?

## 2021-03-27 LAB — CBC WITH DIFFERENTIAL/PLATELET
Basophils Absolute: 0.1 10*3/uL (ref 0.0–0.2)
Basos: 1 %
EOS (ABSOLUTE): 0.1 10*3/uL (ref 0.0–0.4)
Eos: 1 %
Hematocrit: 49.8 % (ref 37.5–51.0)
Hemoglobin: 16.3 g/dL (ref 13.0–17.7)
Immature Grans (Abs): 0 10*3/uL (ref 0.0–0.1)
Immature Granulocytes: 0 %
Lymphocytes Absolute: 1.5 10*3/uL (ref 0.7–3.1)
Lymphs: 14 %
MCH: 30.5 pg (ref 26.6–33.0)
MCHC: 32.7 g/dL (ref 31.5–35.7)
MCV: 93 fL (ref 79–97)
Monocytes Absolute: 0.8 10*3/uL (ref 0.1–0.9)
Monocytes: 7 %
Neutrophils Absolute: 8 10*3/uL — ABNORMAL HIGH (ref 1.4–7.0)
Neutrophils: 77 %
Platelets: 260 10*3/uL (ref 150–450)
RBC: 5.35 x10E6/uL (ref 4.14–5.80)
RDW: 14.1 % (ref 11.6–15.4)
WBC: 10.3 10*3/uL (ref 3.4–10.8)

## 2021-03-27 LAB — CMP14+EGFR
ALT: 9 IU/L (ref 0–44)
AST: 13 IU/L (ref 0–40)
Albumin/Globulin Ratio: 1.8 (ref 1.2–2.2)
Albumin: 4.6 g/dL (ref 3.7–4.7)
Alkaline Phosphatase: 83 IU/L (ref 44–121)
BUN/Creatinine Ratio: 18 (ref 10–24)
BUN: 17 mg/dL (ref 8–27)
Bilirubin Total: 0.5 mg/dL (ref 0.0–1.2)
CO2: 23 mmol/L (ref 20–29)
Calcium: 9.5 mg/dL (ref 8.6–10.2)
Chloride: 106 mmol/L (ref 96–106)
Creatinine, Ser: 0.94 mg/dL (ref 0.76–1.27)
Globulin, Total: 2.6 g/dL (ref 1.5–4.5)
Glucose: 105 mg/dL — ABNORMAL HIGH (ref 70–99)
Potassium: 4.9 mmol/L (ref 3.5–5.2)
Sodium: 143 mmol/L (ref 134–144)
Total Protein: 7.2 g/dL (ref 6.0–8.5)
eGFR: 86 mL/min/{1.73_m2} (ref >59)

## 2021-04-09 ENCOUNTER — Other Ambulatory Visit: Payer: Self-pay

## 2021-04-09 ENCOUNTER — Ambulatory Visit: Payer: Medicare Other | Admitting: Adult Health

## 2021-04-09 ENCOUNTER — Encounter: Payer: Self-pay | Admitting: Adult Health

## 2021-04-09 VITALS — BP 144/97 | HR 75 | Ht 70.0 in | Wt 197.0 lb

## 2021-04-09 DIAGNOSIS — I63431 Cerebral infarction due to embolism of right posterior cerebral artery: Secondary | ICD-10-CM

## 2021-04-09 DIAGNOSIS — I4811 Longstanding persistent atrial fibrillation: Secondary | ICD-10-CM

## 2021-04-09 NOTE — Progress Notes (Signed)
? ?Chief Complaint  ?Patient presents with  ? Follow-up  ?  Rm 2 with spouse [name] ?Pt is well, still having numbness pain and tinging on L side and down back.   ? ? ? ? ?ASSESSMENT AND PLAN ? ?Jacob Sanders is a 76 y.o. male   ?R PCA stroke on Dec 16th 2022 ? Presented with acute onset of vertigo, blurry vision, left arm and leg numbness - currently has left sided numbness, vision impairment and gait impairment -discussed typical recovery time and hopeful ongoing recovery. Denies painful sensation currently.  Encouraged follow-up with ophthalmology as unable to appreciate any visual field loss on confrontation testing and question if complete blurred vision complaint is more for ophthalmic issue ? CT head on January 08, 2021 showed right occipital subacute versus chronic stroke ? MR brain confirmed R PCA stroke (occipital and thalamus) with R P1 occlusion likely in setting of A fib not on AC  ? Continue Eliquis 5mg  twice daily for CHA2DS2-VASc score of 4 and continue atorvastatin 20mg  daily for secondary stroke prevention - both medications managed by cards - he understands importance of lifelong use of both medications unless contraindicated in the future ? Stroke labs 01/10/2021: LDL 129, A1c 5.9 ? Plans on repeat lipid panel next week ?Discussed importance of complete tobacco cessation as continued use greatly increases risk of recurrent strokes ?  ? ? ?Stable from stroke standpoint without further recommendations and risk factors are managed by PCP/cardiology. He may follow up PRN, as usual for our patients who are strictly being followed for stroke. If any new neurological issues should arise, request PCP place referral for evaluation by one of our neurologists. Thank you.  ? ?  ? ? ? ?DIAGNOSTIC DATA (LABS, IMAGING, TESTING) ?- I reviewed patient records, labs, notes, testing and imaging myself where available. ? ? ?MR BRAIN WO CONTRAST  ?MRA HEAD/NECK ?02/05/2021 ?IMPRESSION: ?1. Recent infarct in the  right PCA territory. This involves the ?right occipital lobe with a small infarct in the right thalamus. ?This area shows hypodensity on the CT of 01/08/2021 and continues to ?show abnormal signal on diffusion-weighted imaging likely due to ?facilitated diffusion. ?2. Chronic hemorrhage in the left thalamus. Minimal chronic ?microvascular ischemic change in the white matter. ?3. Severe stenosis of the left supraclinoid internal carotid artery ?and moderate stenosis right supraclinoid internal carotid artery. ?4. Occlusion of the right P1 segment. ?5. Carotid and vertebral arteries are widely patent in the neck. ? ? ? ?ECHO on August 08 2020: ? 1. Left ventricular ejection fraction, by estimation, is 60 to 65%. The  ?left ventricle has normal function. The left ventricle has no regional  ?wall motion abnormalities. There is mild left ventricular hypertrophy.  ?Left ventricular diastolic parameters  ?are indeterminate.  ? 2. Right ventricular systolic function is normal. The right ventricular  ?size is normal. There is mildly elevated pulmonary artery systolic  ?pressure.  ? 3. Left atrial size was severely dilated.  ? 4. Right atrial size was severely dilated.  ? ? ?MEDICAL HISTORY: ? ?PER DR. YAN INITIAL CONSULT VISIT 01/10/2021 ?Jacob Sanders is a 76 year old male, seen in request by his primary care nurse practitioner Jannifer RodneyHawks, Christy A, for evaluation of stroke, he is accompanied by his wife at today's visit January 10, 2021, ? ?I reviewed and summarized the referring note. PMHX ?HTN ?Atrial Fibrillation, hx of taking warfarin, cause of bloody urine, stopped warfarin since 2019. ?Long time smoker ? ?He suffered more than a month  long history of upper respiratory infection, treated with few rounds of antibiotics, December 28, 2020, he woke up from overnight sleep, felt dizziness, unsteady gait, difficulty focusing, he will complain his wife to clean up the church, 2 hours later, when he was trying to get into the  car, he was noted to have more difficulty, complains of left arm and numbness, could not fasten his seatbelt, also noted to be confused, agitated ? ?He was evaluated by primary care on December 19, referred to CT head on January 08, 2021, I personally reviewed hypodensity at right posterior cerebral artery distribution, right occipital lobe, subacute versus chronic stroke, periventricular small vessel disease ? ?Over the past few weeks, he has mild improvement, but not back to baseline yet ? ?Echocardiogram August 08, 2020, ejection fraction 60 to 65%, right and left atrial size with severe dilated ? ?He had long history of atrial fibrillation, previously was treated with warfarin, but he has developed bloody urine, stopped the warfarin since 2019, he is not on anticoagulation treatment either ? ? ? ? ?UPDATE 04/09/2021 JM: Returns for follow-up visit after prior initial consult visit with Dr. Terrace Arabia 3 months ago.  He is accompanied by his wife.  Reports continued left side numbness (notes some improvement since onset, doesn't feel as tight),  blurred vision (worse in left lower periphery) and mild imbalance. He plans on following up with ophthalmology for another evaluation as he was also told he has cataracts and unsure if this is contributing to his vision. Denies new or reoccurring stroke/TIA symptoms.   ? ?Completed MRI brain and MRA head/neck 01/2021 which confirmed right PCA infarct as well as showed chronic hemorrhage in left thalamic, severe stenosis of left supraclinoid ICA and moderate stenosis right ICA and occlusion at right P1 segment.   ? ?He has since been started on Eliquis 5 mg twice daily by cardiology in 01/2021 as CHA2DS2-VASc score of 4. He denies any side effects or bleeding concerns since starting. He was also switched from simvastatin to atorvastatin by cardiology at same visit due to LDL 129 in 12/2020, denies any side effects.  Plans on repeat lipid panel next week.  Blood pressure today 144/97.   Continued tobacco use <0.5 PPD , was smoking 3-4 PPD prior.  No further concerns at this time ? ? ? ? ? ? ? ?PHYSICAL EXAM: ?  ?Vitals:  ? 04/09/21 1308  ?BP: (!) 144/97  ?Pulse: 75  ?Weight: 197 lb (89.4 kg)  ?Height: 5\' 10"  (1.778 m)  ? ? ?Not recorded ?  ? ? ?Body mass index is 28.27 kg/m?. ? ?PHYSICAL EXAMNIATION: ? ?Gen: NAD, very pleasant elderly Caucasian male, conversant, well nourised, well groomed                     ?Cardiovascular: Irregular rate and arrhythmia ?Eyes: Conjunctivae clear without exudates or hemorrhage ?Neck: Supple, no carotid bruits. ?Pulmonary: Clear to auscultation bilaterally  ? ?NEUROLOGICAL EXAM: ? ?MENTAL STATUS: ?Speech: ?   Speech is normal; fluent and spontaneous with normal comprehension.  ?Cognition: ?    Orientation to time, place and person ?    Normal recent and remote memory ?    Normal Attention span and concentration ?    Normal Language, naming, repeating,spontaneous speech ?    Fund of knowledge ?  ?CRANIAL NERVES: ?CN II: Pupils are round equal and briskly reactive to light. Unable to appreciate any visual loss on confrontation testing  ?CN III, IV, VI: extraocular  movement are normal. No ptosis. ?CN V: Facial sensation is intact to light touch ?CN VII: Face is symmetric with normal eye closure  ?CN VIII: Hearing is normal to causal conversation. ?CN IX, X: Phonation is normal. ?CN XI: Head turning and shoulder shrug are intact ? ?MOTOR: ?There is no pronator drift of out-stretched arms. Muscle bulk and tone are normal. Muscle strength is normal. ? ?REFLEXES: ?Reflexes are 2+ and symmetric at the biceps, triceps, knees, and ankles. Plantar responses are flexor. ? ?SENSORY: ?Intact to light touch, pinprick and vibratory sensation are intact in fingers and toes.  Although subjective numbness/tingling left arm and leg ? ?COORDINATION: ?There is no trunk or limb dysmetria noted. ? ?GAIT/STANCE: Need push-up to get up from seated position, cautious, without use of  assistive device. ? ? ? ? ?REVIEW OF SYSTEMS:  ?Full 14 system review of systems performed and notable only for as above ?All other review of systems were negative. ? ? ?ALLERGIES: ?Allergies  ?Allergen Reactio

## 2021-04-09 NOTE — Patient Instructions (Signed)
Continue Eliquis (apixaban) daily  and atorvastatin 20mg  daily  for secondary stroke prevention ? ?Continue to decrease daily tobacco use until complete cessation  ? ?Continue to follow up with PCP regarding cholesterol and blood pressure management  ?Maintain strict control of hypertension with blood pressure goal below 130/90 and cholesterol with LDL cholesterol (bad cholesterol) goal below 70 mg/dL.  ? ?Signs of a Stroke? Follow the BEFAST method:  ?Balance Watch for a sudden loss of balance, trouble with coordination or vertigo ?Eyes Is there a sudden loss of vision in one or both eyes? Or double vision?  ?Face: Ask the person to smile. Does one side of the face droop or is it numb?  ?Arms: Ask the person to raise both arms. Does one arm drift downward? Is there weakness or numbness of a leg? ?Speech: Ask the person to repeat a simple phrase. Does the speech sound slurred/strange? Is the person confused ? ?Time: If you observe any of these signs, call 911. ? ? ? ? ? ? ? ?Thank you for coming to see [place] at [facility]. I hope we have been able to provide you high quality care today. ? ?You may receive a patient satisfaction survey over the next few weeks. We would appreciate your feedback and comments so that we may continue to improve ourselves and the health of our patients. ? ?

## 2021-04-10 DIAGNOSIS — Z01818 Encounter for other preprocedural examination: Secondary | ICD-10-CM | POA: Diagnosis not present

## 2021-04-10 DIAGNOSIS — H25811 Combined forms of age-related cataract, right eye: Secondary | ICD-10-CM | POA: Diagnosis not present

## 2021-04-10 DIAGNOSIS — H25812 Combined forms of age-related cataract, left eye: Secondary | ICD-10-CM | POA: Diagnosis not present

## 2021-04-15 ENCOUNTER — Telehealth: Payer: Self-pay | Admitting: *Deleted

## 2021-04-15 DIAGNOSIS — I482 Chronic atrial fibrillation, unspecified: Secondary | ICD-10-CM | POA: Diagnosis not present

## 2021-04-15 DIAGNOSIS — I10 Essential (primary) hypertension: Secondary | ICD-10-CM | POA: Diagnosis not present

## 2021-04-15 LAB — LIPID PANEL
Chol/HDL Ratio: 2.3 ratio (ref 0.0–5.0)
Cholesterol, Total: 138 mg/dL (ref 100–199)
HDL: 59 mg/dL (ref >39)
LDL Chol Calc (NIH): 66 mg/dL (ref 0–99)
Triglycerides: 61 mg/dL (ref 0–149)
VLDL Cholesterol Cal: 13 mg/dL (ref 5–40)

## 2021-04-15 LAB — COMPREHENSIVE METABOLIC PANEL
ALT: 9 IU/L (ref 0–44)
AST: 13 IU/L (ref 0–40)
Albumin/Globulin Ratio: 2 (ref 1.2–2.2)
Albumin: 4.4 g/dL (ref 3.7–4.7)
Alkaline Phosphatase: 75 IU/L (ref 44–121)
BUN/Creatinine Ratio: 20 (ref 10–24)
BUN: 18 mg/dL (ref 8–27)
Bilirubin Total: 0.6 mg/dL (ref 0.0–1.2)
CO2: 23 mmol/L (ref 20–29)
Calcium: 9.3 mg/dL (ref 8.6–10.2)
Chloride: 103 mmol/L (ref 96–106)
Creatinine, Ser: 0.88 mg/dL (ref 0.76–1.27)
Globulin, Total: 2.2 g/dL (ref 1.5–4.5)
Glucose: 101 mg/dL — ABNORMAL HIGH (ref 70–99)
Potassium: 4.3 mmol/L (ref 3.5–5.2)
Sodium: 139 mmol/L (ref 134–144)
Total Protein: 6.6 g/dL (ref 6.0–8.5)
eGFR: 91 mL/min/{1.73_m2} (ref >59)

## 2021-04-15 LAB — CBC
Hematocrit: 45.8 % (ref 37.5–51.0)
Hemoglobin: 15.5 g/dL (ref 13.0–17.7)
MCH: 30.8 pg (ref 26.6–33.0)
MCHC: 33.8 g/dL (ref 31.5–35.7)
MCV: 91 fL (ref 79–97)
Platelets: 228 10*3/uL (ref 150–450)
RBC: 5.03 x10E6/uL (ref 4.14–5.80)
RDW: 13.3 % (ref 11.6–15.4)
WBC: 9 10*3/uL (ref 3.4–10.8)

## 2021-04-15 NOTE — Telephone Encounter (Signed)
Received surgical clearance letter from [facility], re: cataract extraction bilateral under IV sedation, surgery date to be determined. Letter filled out, signed by NP: Patient is cleared medically for the above procedure as noted from a neurological standpoint.Okay to pursue procedure with small but acceptable risk of stroke while off therapy, avoid hypotension. Faxed to [facility] , f [phone]. Received confirmation. ?

## 2021-04-22 DIAGNOSIS — H25812 Combined forms of age-related cataract, left eye: Secondary | ICD-10-CM | POA: Diagnosis not present

## 2021-05-06 DIAGNOSIS — H2511 Age-related nuclear cataract, right eye: Secondary | ICD-10-CM | POA: Diagnosis not present

## 2021-05-06 DIAGNOSIS — H25811 Combined forms of age-related cataract, right eye: Secondary | ICD-10-CM | POA: Diagnosis not present

## 2021-05-13 ENCOUNTER — Other Ambulatory Visit: Payer: Self-pay | Admitting: Family

## 2021-05-13 DIAGNOSIS — F411 Generalized anxiety disorder: Secondary | ICD-10-CM

## 2021-06-03 ENCOUNTER — Ambulatory Visit (INDEPENDENT_AMBULATORY_CARE_PROVIDER_SITE_OTHER): Payer: Medicare Other

## 2021-06-03 VITALS — Wt 197.0 lb

## 2021-06-03 DIAGNOSIS — Z0000 Encounter for general adult medical examination without abnormal findings: Secondary | ICD-10-CM

## 2021-06-03 NOTE — Progress Notes (Signed)
Subjective:   Jacob Sanders is a 76 y.o. male who presents for Medicare Annual/Subsequent preventive examination.  Virtual Visit via Telephone Note  I connected with  LAITH ANTONELLI on 06/03/21 at 10:30 AM EDT by telephone and verified that I am speaking with the correct person using two identifiers.  Location: Patient: Home Provider: [facility] Persons participating in the virtual visit: patient/Nurse Health Advisor   I discussed the limitations, risks, security and privacy concerns of performing an evaluation and management service by telephone and the availability of in person appointments. The patient expressed understanding and agreed to proceed.  Interactive audio and video telecommunications were attempted between this nurse and patient, however failed, due to patient having technical difficulties OR patient did not have access to video capability.  We continued and completed visit with audio only.  Some vital signs may be absent or patient reported.   Fredrika Canby E Kiyah Demartini, LPN   Review of Systems     Cardiac Risk Factors include: advanced age (>10men, >2 women);male gender;dyslipidemia;hypertension;sedentary lifestyle;smoking/ tobacco exposure;Other (see comment), Risk factor comments: hx of stroke, A.Fib.     Objective:    Today's Vitals   06/03/21 1033  Weight: 197 lb (89.4 kg)  PainSc: 6    Body mass index is 28.27 kg/m.     06/03/2021   10:42 AM 05/31/2020   11:35 AM 07/29/2019    1:32 PM 05/31/2019    1:49 PM 05/21/2018    1:44 PM 07/18/2016    7:50 AM 07/17/2016    7:50 PM  Advanced Directives  Does Patient Have a Medical Advance Directive? No No No Yes No No No  Type of Advance Directive    Living will     Does patient want to make changes to medical advance directive?    No - Patient declined     Would patient like information on creating a medical advance directive? No - Patient declined No - Patient declined No - Patient declined  No - Patient declined No - Patient  declined No - Patient declined    Current Medications (verified) Outpatient Encounter Medications as of 06/03/2021  Medication Sig   apixaban (ELIQUIS) 5 MG TABS tablet Take 1 tablet (5 mg total) by mouth 2 (two) times daily.   busPIRone (BUSPAR) 5 MG tablet TAKE 1 TABLET BY MOUTH TWICE  DAILY   diltiazem (CARDIZEM CD) 300 MG 24 hr capsule Take 1 capsule (300 mg total) by mouth daily.   omeprazole (PRILOSEC) 20 MG capsule TAKE 1 CAPSULE BY MOUTH DAILY AS NEEDED   sodium chloride (OCEAN) 0.65 % SOLN nasal spray Place 1 spray into both nostrils as needed for congestion.   atorvastatin (LIPITOR) 20 MG tablet Take 1 tablet (20 mg total) by mouth daily.   Budeson-Glycopyrrol-Formoterol (BREZTRI AEROSPHERE) 160-9-4.8 MCG/ACT AERO Inhale 2 puffs into the lungs 2 (two) times daily. (Patient not taking: Reported on 06/03/2021)   meclizine (ANTIVERT) 25 MG tablet TAKE 1 TABLET BY MOUTH 3 TIMES  DAILY AS NEEDED FOR DIZZINESS (Patient not taking: Reported on 06/03/2021)   No facility-administered encounter medications on file as of 06/03/2021.    Allergies (verified) Warfarin and related, Morphine and related, and Prednisone   History: Past Medical History:  Diagnosis Date   Arthritis    Atrial fibrillation (HCC)    Dysrhythmia    afib   GERD (gastroesophageal reflux disease)    History of kidney stones    passed   Hyperlipidemia    Hypertension  Nerve damage    left hand after accident- 1995   Seasonal allergies    Staph infection    post back surgeries 1998 and1999   Past Surgical History:  Procedure Laterality Date   BACK SURGERY  [id number]   discectomy   HAND SURGERY Left 1995   OPEN REDUCTION INTERNAL FIXATION (ORIF) DISTAL RADIAL FRACTURE Left 07/29/2019   Procedure: OPEN REDUCTION INTERNAL FIXATION (ORIF) LEFT DISTAL RADIAL FRACTURE;  Surgeon: Dominica Severin, MD;  Location: [facility];  Service: Orthopedics;  Laterality: Left;  90 mins   TONSILLECTOMY     childhood    TOOTH  EXTRACTION  3 weeks ago ;   had abcess beneath tooth; experiened some bleeding for 20 hours post op   TOTAL HIP ARTHROPLASTY Left 05/22/2016   Procedure: LEFT TOTAL HIP ARTHROPLASTY ANTERIOR APPROACH;  Surgeon: Samson Frederic, MD;  Location: [facility];  Service: Orthopedics;  Laterality: Left;  Dr. requesting RNFA   TOTAL HIP ARTHROPLASTY Right 07/17/2016   Procedure: RIGHT TOTAL HIP ARTHROPLASTY ANTERIOR APPROACH;  Surgeon: Samson Frederic, MD;  Location: [facility];  Service: Orthopedics;  Laterality: Right;  needs RNFA   Family History  Problem Relation Age of Onset   Hypertension Mother    Aneurysm Father    Hypertension Father    Social History   Socioeconomic History   Marital status: Married    Spouse name: Steffanie Dunn   Number of children: 3   Years of education: 10   Highest education level: 10th grade  Occupational History   Occupation: Retired  Tobacco Use   Smoking status: Every Day    Packs/day: 1.00    Years: 45.00    Pack years: 45.00    Types: Cigars, Cigarettes   Smokeless tobacco: Never   Tobacco comments:    4-5 daily  Vaping Use   Vaping Use: Never used  Substance and Sexual Activity   Alcohol use: No   Drug use: No   Sexual activity: Not Currently  Other Topics Concern   Not on file  Social History Narrative   [place] Benefits   Social Determinants of Health   Financial Resource Strain: Low Risk    Difficulty of Paying Living Expenses: Not hard at all  Food Insecurity: No Food Insecurity   Worried About Programme researcher, broadcasting/film/video in the Last Year: Never true   Ran Out of Food in the Last Year: Never true  Transportation Needs: No Transportation Needs   Lack of Transportation (Medical): No   Lack of Transportation (Non-Medical): No  Physical Activity: Insufficiently Active   Days of Exercise per Week: 7 days   Minutes of Exercise per Session: 10 min  Stress: No Stress Concern Present   Feeling of Stress : Not at all  Social Connections: Socially Integrated    Frequency of Communication with Friends and Family: More than three times a week   Frequency of Social Gatherings with Friends and Family: Three times a week   Attends Religious Services: More than 4 times per year   Active Member of Clubs or Organizations: Yes   Attends Engineer, structural: More than 4 times per year   Marital Status: Married    Tobacco Counseling Ready to quit: Not Answered Counseling given: Not Answered Tobacco comments: 4-5 daily   Clinical Intake:  Pre-visit preparation completed: Yes  Pain : 0-10 Pain Score: 6  Pain Type: Chronic pain Pain Location: Generalized Pain Descriptors / Indicators: Aching, Discomfort Pain Onset: More than a month ago  Pain Frequency: Intermittent     BMI - recorded: 28.27 Nutritional Status: BMI 25 -29 Overweight Nutritional Risks: None Diabetes: No  How often do you need to have someone help you when you read instructions, pamphlets, or other written materials from your doctor or pharmacy?: 1 - Never  Diabetic? no  Interpreter Needed?: No  Information entered by :: Cailee Blanke, LPN   Activities of Daily Living    06/03/2021   10:42 AM  In your present state of health, do you have any difficulty performing the following activities:  Hearing? 1  Comment declines hearing aids  Vision? 0  Difficulty concentrating or making decisions? 0  Walking or climbing stairs? 0  Dressing or bathing? 0  Doing errands, shopping? 0  Preparing Food and eating ? N  Using the Toilet? N  In the past six months, have you accidently leaked urine? Y  Comment mild  Do you have problems with loss of bowel control? N  Managing your Medications? N  Managing your Finances? N  Housekeeping or managing your Housekeeping? N    Patient Care Team: Junie Spencer, FNP as PCP - General (Nurse Practitioner) Iran Ouch, MD as PCP - Cardiology (Cardiology) Jenene Slicker Deberah Castle, MD as Consulting Physician  (Ophthalmology)  Indicate any recent Medical Services you may have received from other than [facility] providers in the past year (date may be approximate).     Assessment:   This is a routine wellness examination for [facility].  Hearing/Vision screen Hearing Screening - Comments:: [facility] recommended hearing aids, but he declines - he says hearing is fine - others say he cannot hear well. Vision Screening - Comments:: Wears otc reading glasses prn only - up to date with routine eye exams with [facility] (recently had cataract surgery and is scheduled for blepharoplasty)  Dietary issues and exercise activities discussed: Current Exercise Habits: The patient does not participate in regular exercise at present, Exercise limited by: neurologic condition(s)   Goals Addressed             This Visit's Progress    Exercise 150 min/wk Moderate Activity   Not on track    Have 3 meals a day   On track    Prevent falls   On track      Depression Screen    06/03/2021   10:40 AM 03/26/2021    9:47 AM 02/21/2021    9:54 AM 02/21/2021    9:48 AM 12/17/2020   10:18 AM 05/31/2020   11:32 AM 05/03/2020    9:13 AM  PHQ 2/9 Scores  PHQ - 2 Score 0 0 0 0 0 0 0    Fall Risk    06/03/2021   10:36 AM 03/26/2021    9:47 AM 02/21/2021    9:48 AM 11/27/2020    9:41 AM 05/31/2020   11:34 AM  Fall Risk   Falls in the past year? 0 0 0 0 1  Number falls in past yr: 0    0  Comment     walking backwards, tripped over a stumb, fractured left hand and wrist and had to have surgery  Injury with Fall? 0    1  Risk for fall due to : Orthopedic patient;Other (Comment);Impaired balance/gait    History of fall(s);Orthopedic patient  Risk for fall due to: Comment hx of Stroke      Follow up Education provided;Falls prevention discussed    Education provided;Falls prevention discussed    FALL RISK  PREVENTION PERTAINING TO THE HOME:  Any stairs in or around the home? Yes  If so, are there any without handrails? No  Home  free of loose throw rugs in walkways, pet beds, electrical cords, etc? Yes  Adequate lighting in your home to reduce risk of falls? Yes   ASSISTIVE DEVICES UTILIZED TO PREVENT FALLS:  Life alert? No  Use of a cane, walker or w/c?  Cane prn Grab bars in the bathroom? No  Shower chair or bench in shower? No  Elevated toilet seat or a handicapped toilet? No   TIMED UP AND GO:  Was the test performed? No . Telephonic visit  Cognitive Function:        06/03/2021   10:46 AM 05/31/2019    1:49 PM 05/21/2018    1:48 PM  6CIT Screen  What Year? 0 points 0 points 0 points  What month? 0 points 0 points 0 points  What time? 0 points 0 points 0 points  Count back from 20 0 points 0 points 0 points  Months in reverse 0 points 0 points 0 points  Repeat phrase 2 points 2 points 0 points  Total Score 2 points 2 points 0 points    Immunizations Immunization History  Administered Date(s) Administered   Tdap 05/09/2011    TDAP status: Due, Education has been provided regarding the importance of this vaccine. Advised may receive this vaccine at local pharmacy or Health Dept. Aware to provide a copy of the vaccination record if obtained from local pharmacy or Health Dept. Verbalized acceptance and understanding.  Flu Vaccine status: Declined, Education has been provided regarding the importance of this vaccine but patient still declined. Advised may receive this vaccine at local pharmacy or Health Dept. Aware to provide a copy of the vaccination record if obtained from local pharmacy or Health Dept. Verbalized acceptance and understanding.  Pneumococcal vaccine status: Declined,  Education has been provided regarding the importance of this vaccine but patient still declined. Advised may receive this vaccine at local pharmacy or Health Dept. Aware to provide a copy of the vaccination record if obtained from local pharmacy or Health Dept. Verbalized acceptance and understanding.   Covid-19 vaccine  status: Declined, Education has been provided regarding the importance of this vaccine but patient still declined. Advised may receive this vaccine at local pharmacy or Health Dept.or vaccine clinic. Aware to provide a copy of the vaccination record if obtained from local pharmacy or Health Dept. Verbalized acceptance and understanding.  Qualifies for Shingles Vaccine? Yes   Zostavax completed No   Shingrix Completed?: No.    Education has been provided regarding the importance of this vaccine. Patient has been advised to call insurance company to determine out of pocket expense if they have not yet received this vaccine. Advised may also receive vaccine at local pharmacy or Health Dept. Verbalized acceptance and understanding.  Screening Tests Health Maintenance  Topic Date Due   COVID-19 Vaccine (1) Never done   COLONOSCOPY (Pts 45-76yrs Insurance coverage will need to be confirmed)  Never done   TETANUS/TDAP  05/08/2021   Zoster Vaccines- Shingrix (1 of 2) 06/26/2021 (Originally 07/30/1964)   Pneumonia Vaccine 4+ Years old (1 - PCV) 11/27/2021 (Originally 07/29/1951)   INFLUENZA VACCINE  08/13/2021   Hepatitis C Screening  Completed   HPV VACCINES  Aged Out    Health Maintenance  Health Maintenance Due  Topic Date Due   COVID-19 Vaccine (1) Never done   COLONOSCOPY (Pts 45-41yrs Insurance  coverage will need to be confirmed)  Never done   TETANUS/TDAP  05/08/2021    Colorectal cancer screening: No longer required.  He declines  Lung Cancer Screening: (Low Dose CT Chest recommended if Age 28-80 years, 30 pack-year currently smoking OR have quit w/in 15years.) does qualify.   Lung Cancer Screening Referral: declines  Additional Screening:  Hepatitis C Screening: does qualify; Completed 05/15/2014  Vision Screening: Recommended annual ophthalmology exams for early detection of glaucoma and other disorders of the eye. Is the patient up to date with their annual eye exam?  Yes  Who  is the provider or what is the name of the office in which the patient attends annual eye exams? [facility] If pt is not established with a provider, would they like to be referred to a provider to establish care? No .   Dental Screening: Recommended annual dental exams for proper oral hygiene  Community Resource Referral / Chronic Care Management: CRR required this visit?  No   CCM required this visit?  No      Plan:     I have personally reviewed and noted the following in the patient's chart:   Medical and social history Use of alcohol, tobacco or illicit drugs  Current medications and supplements including opioid prescriptions. Patient is not currently taking opioid prescriptions. Functional ability and status Nutritional status Physical activity Advanced directives List of other physicians Hospitalizations, surgeries, and ER visits in previous 12 months Vitals Screenings to include cognitive, depression, and falls Referrals and appointments  In addition, I have reviewed and discussed with patient certain preventive protocols, quality metrics, and best practice recommendations. A written personalized care plan for preventive services as well as general preventive health recommendations were provided to patient.     [place] Constablemy E Luxe Cuadros, LPN   1/61/09605/22/2023   Nurse Notes: None

## 2021-06-03 NOTE — Patient Instructions (Signed)
Jacob Sanders , Thank you for taking time to come for your Medicare Wellness Visit. I appreciate your ongoing commitment to your health goals. Please review the following plan we discussed and let me know if I can assist you in the future.   Screening recommendations/referrals: Colonoscopy: Declined Recommended yearly ophthalmology/optometry visit for glaucoma screening and checkup Recommended yearly dental visit for hygiene and checkup  Vaccinations: Influenza vaccine: recommend every Fall Pneumococcal vaccine: recommend once per lifetime Prevnar-20 Tdap vaccine: Done 05/09/2011 - recommend repeat every 10 years *get this at your next visit Shingles vaccine: recommend Shingrix which is 2 doses 2-6 months apart and over 90% effective     Covid-19: recommend 2 doses one month apart with a booster 6 months later  Advanced directives: Advance directive discussed with you today. Even though you declined this today, please call our office should you change your mind, and we can give you the proper paperwork for you to fill out.   Conditions/risks identified: Aim for 30 minutes of exercise or brisk walking, 6-8 glasses of water, and 5 servings of fruits and vegetables each day.   Next appointment: Follow up in one year for your annual wellness visit.   Preventive Care 74 Years and Older, Male  Preventive care refers to lifestyle choices and visits with your health care provider that can promote health and wellness. What does preventive care include? A yearly physical exam. This is also called an annual well check. Dental exams once or twice a year. Routine eye exams. Ask your health care provider how often you should have your eyes checked. Personal lifestyle choices, including: Daily care of your teeth and gums. Regular physical activity. Eating a healthy diet. Avoiding tobacco and drug use. Limiting alcohol use. Practicing safe sex. Taking low doses of aspirin every day. Taking vitamin and  mineral supplements as recommended by your health care provider. What happens during an annual well check? The services and screenings done by your health care provider during your annual well check will depend on your age, overall health, lifestyle risk factors, and family history of disease. Counseling  Your health care provider may ask you questions about your: Alcohol use. Tobacco use. Drug use. Emotional well-being. Home and relationship well-being. Sexual activity. Eating habits. History of falls. Memory and ability to understand (cognition). Work and work Astronomer. Screening  You may have the following tests or measurements: Height, weight, and BMI. Blood pressure. Lipid and cholesterol levels. These may be checked every 5 years, or more frequently if you are over 74 years old. Skin check. Lung cancer screening. You may have this screening every year starting at age 74 if you have a 30-pack-year history of smoking and currently smoke or have quit within the past 15 years. Fecal occult blood test (FOBT) of the stool. You may have this test every year starting at age 74. Flexible sigmoidoscopy or colonoscopy. You may have a sigmoidoscopy every 5 years or a colonoscopy every 10 years starting at age 74. Prostate cancer screening. Recommendations will vary depending on your family history and other risks. Hepatitis C blood test. Hepatitis B blood test. Sexually transmitted disease (STD) testing. Diabetes screening. This is done by checking your blood sugar (glucose) after you have not eaten for a while (fasting). You may have this done every 1-3 years. Abdominal aortic aneurysm (AAA) screening. You may need this if you are a current or former smoker. Osteoporosis. You may be screened starting at age 74 if you are at high  risk. Talk with your health care provider about your test results, treatment options, and if necessary, the need for more tests. Vaccines  Your health care  provider may recommend certain vaccines, such as: Influenza vaccine. This is recommended every year. Tetanus, diphtheria, and acellular pertussis (Tdap, Td) vaccine. You may need a Td booster every 10 years. Zoster vaccine. You may need this after age 74. Pneumococcal 13-valent conjugate (PCV13) vaccine. One dose is recommended after age 74. Pneumococcal polysaccharide (PPSV23) vaccine. One dose is recommended after age 74. Talk to your health care provider about which screenings and vaccines you need and how often you need them. This information is not intended to replace advice given to you by your health care provider. Make sure you discuss any questions you have with your health care provider. Document Released: 01/26/2015 Document Revised: 09/19/2015 Document Reviewed: 10/31/2014 Elsevier Interactive Patient Education  2017 [employer].  Fall Prevention in the Home Falls can cause injuries. They can happen to people of all ages. There are many things you can do to make your home safe and to help prevent falls. What can I do on the outside of my home? Regularly fix the edges of walkways and driveways and fix any cracks. Remove anything that might make you trip as you walk through a door, such as a raised step or threshold. Trim any bushes or trees on the path to your home. Use bright outdoor lighting. Clear any walking paths of anything that might make someone trip, such as rocks or tools. Regularly check to see if handrails are loose or broken. Make sure that both sides of any steps have handrails. Any raised decks and porches should have guardrails on the edges. Have any leaves, snow, or ice cleared regularly. Use sand or salt on walking paths during winter. Clean up any spills in your garage right away. This includes oil or grease spills. What can I do in the bathroom? Use night lights. Install grab bars by the toilet and in the tub and shower. Do not use towel bars as grab  bars. Use non-skid mats or decals in the tub or shower. If you need to sit down in the shower, use a plastic, non-slip stool. Keep the floor dry. Clean up any water that spills on the floor as soon as it happens. Remove soap buildup in the tub or shower regularly. Attach bath mats securely with double-sided non-slip rug tape. Do not have throw rugs and other things on the floor that can make you trip. What can I do in the bedroom? Use night lights. Make sure that you have a light by your bed that is easy to reach. Do not use any sheets or blankets that are too big for your bed. They should not hang down onto the floor. Have a firm chair that has side arms. You can use this for support while you get dressed. Do not have throw rugs and other things on the floor that can make you trip. What can I do in the kitchen? Clean up any spills right away. Avoid walking on wet floors. Keep items that you use a lot in easy-to-reach places. If you need to reach something above you, use a strong step stool that has a grab bar. Keep electrical cords out of the way. Do not use floor polish or wax that makes floors slippery. If you must use wax, use non-skid floor wax. Do not have throw rugs and other things on the floor that can  make you trip. What can I do with my stairs? Do not leave any items on the stairs. Make sure that there are handrails on both sides of the stairs and use them. Fix handrails that are broken or loose. Make sure that handrails are as long as the stairways. Check any carpeting to make sure that it is firmly attached to the stairs. Fix any carpet that is loose or worn. Avoid having throw rugs at the top or bottom of the stairs. If you do have throw rugs, attach them to the floor with carpet tape. Make sure that you have a light switch at the top of the stairs and the bottom of the stairs. If you do not have them, ask someone to add them for you. What else can I do to help prevent  falls? Wear shoes that: Do not have high heels. Have rubber bottoms. Are comfortable and fit you well. Are closed at the toe. Do not wear sandals. If you use a stepladder: Make sure that it is fully opened. Do not climb a closed stepladder. Make sure that both sides of the stepladder are locked into place. Ask someone to hold it for you, if possible. Clearly mark and make sure that you can see: Any grab bars or handrails. First and last steps. Where the edge of each step is. Use tools that help you move around (mobility aids) if they are needed. These include: Canes. Walkers. Scooters. Crutches. Turn on the lights when you go into a dark area. Replace any light bulbs as soon as they burn out. Set up your furniture so you have a clear path. Avoid moving your furniture around. If any of your floors are uneven, fix them. If there are any pets around you, be aware of where they are. Review your medicines with your doctor. Some medicines can make you feel dizzy. This can increase your chance of falling. Ask your doctor what other things that you can do to help prevent falls. This information is not intended to replace advice given to you by your health care provider. Make sure you discuss any questions you have with your health care provider. Document Released: 10/26/2008 Document Revised: 06/07/2015 Document Reviewed: 02/03/2014 Elsevier Interactive Patient Education  2017 [employer].

## 2021-06-07 ENCOUNTER — Ambulatory Visit (INDEPENDENT_AMBULATORY_CARE_PROVIDER_SITE_OTHER): Payer: Medicare Other

## 2021-06-07 ENCOUNTER — Encounter: Payer: Self-pay | Admitting: Family Medicine

## 2021-06-07 ENCOUNTER — Ambulatory Visit (INDEPENDENT_AMBULATORY_CARE_PROVIDER_SITE_OTHER): Payer: Medicare Other | Admitting: Family Medicine

## 2021-06-07 VITALS — BP 145/89 | HR 95 | Temp 97.4°F | Ht 70.0 in | Wt 199.0 lb

## 2021-06-07 DIAGNOSIS — M19012 Primary osteoarthritis, left shoulder: Secondary | ICD-10-CM | POA: Diagnosis not present

## 2021-06-07 DIAGNOSIS — M25512 Pain in left shoulder: Secondary | ICD-10-CM

## 2021-06-07 DIAGNOSIS — G8929 Other chronic pain: Secondary | ICD-10-CM

## 2021-06-07 IMAGING — DX DG SHOULDER 2+V*L*
3 series · 3 of 3 positions shown · non-contrast
Comparison: None Available.

CLINICAL DATA: Shoulder pain

EXAM:
LEFT SHOULDER - 2+ VIEW

[shoulder ap]
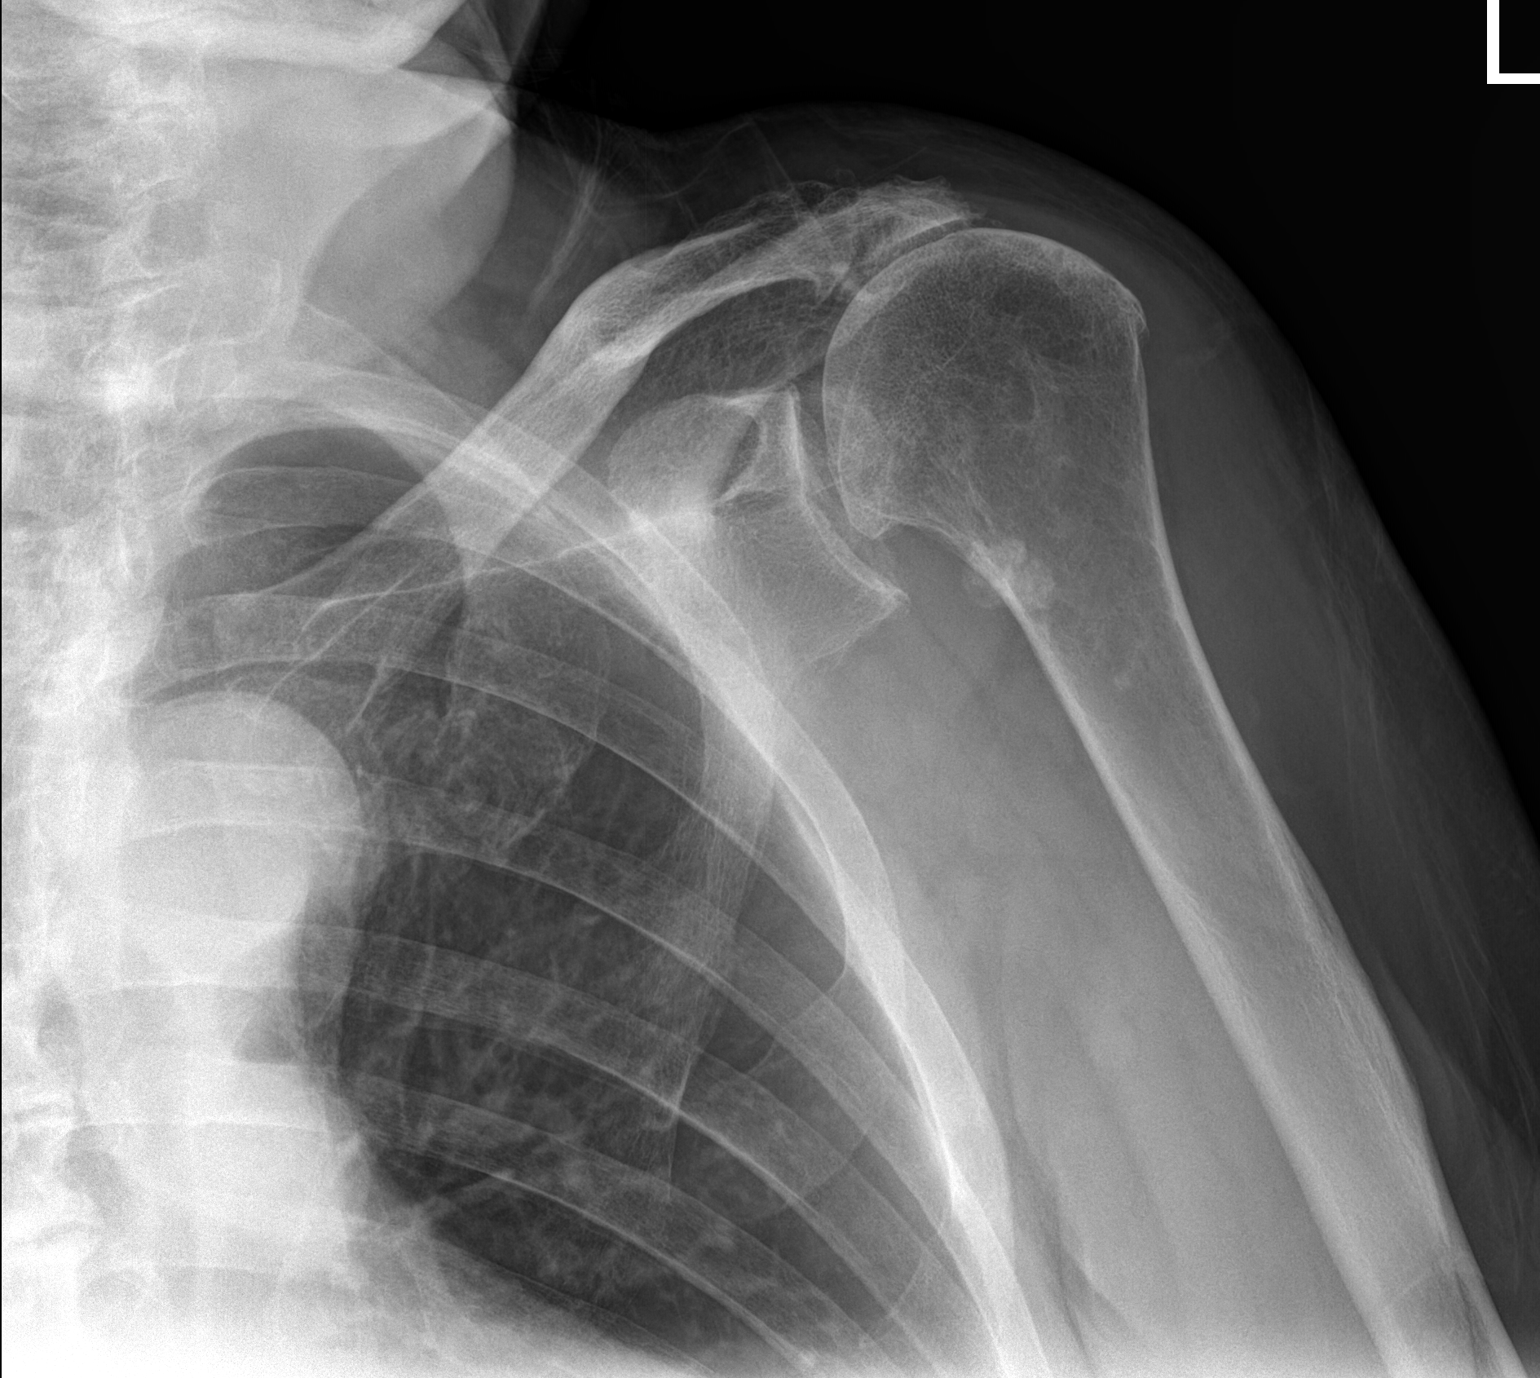

[shoulder obl]
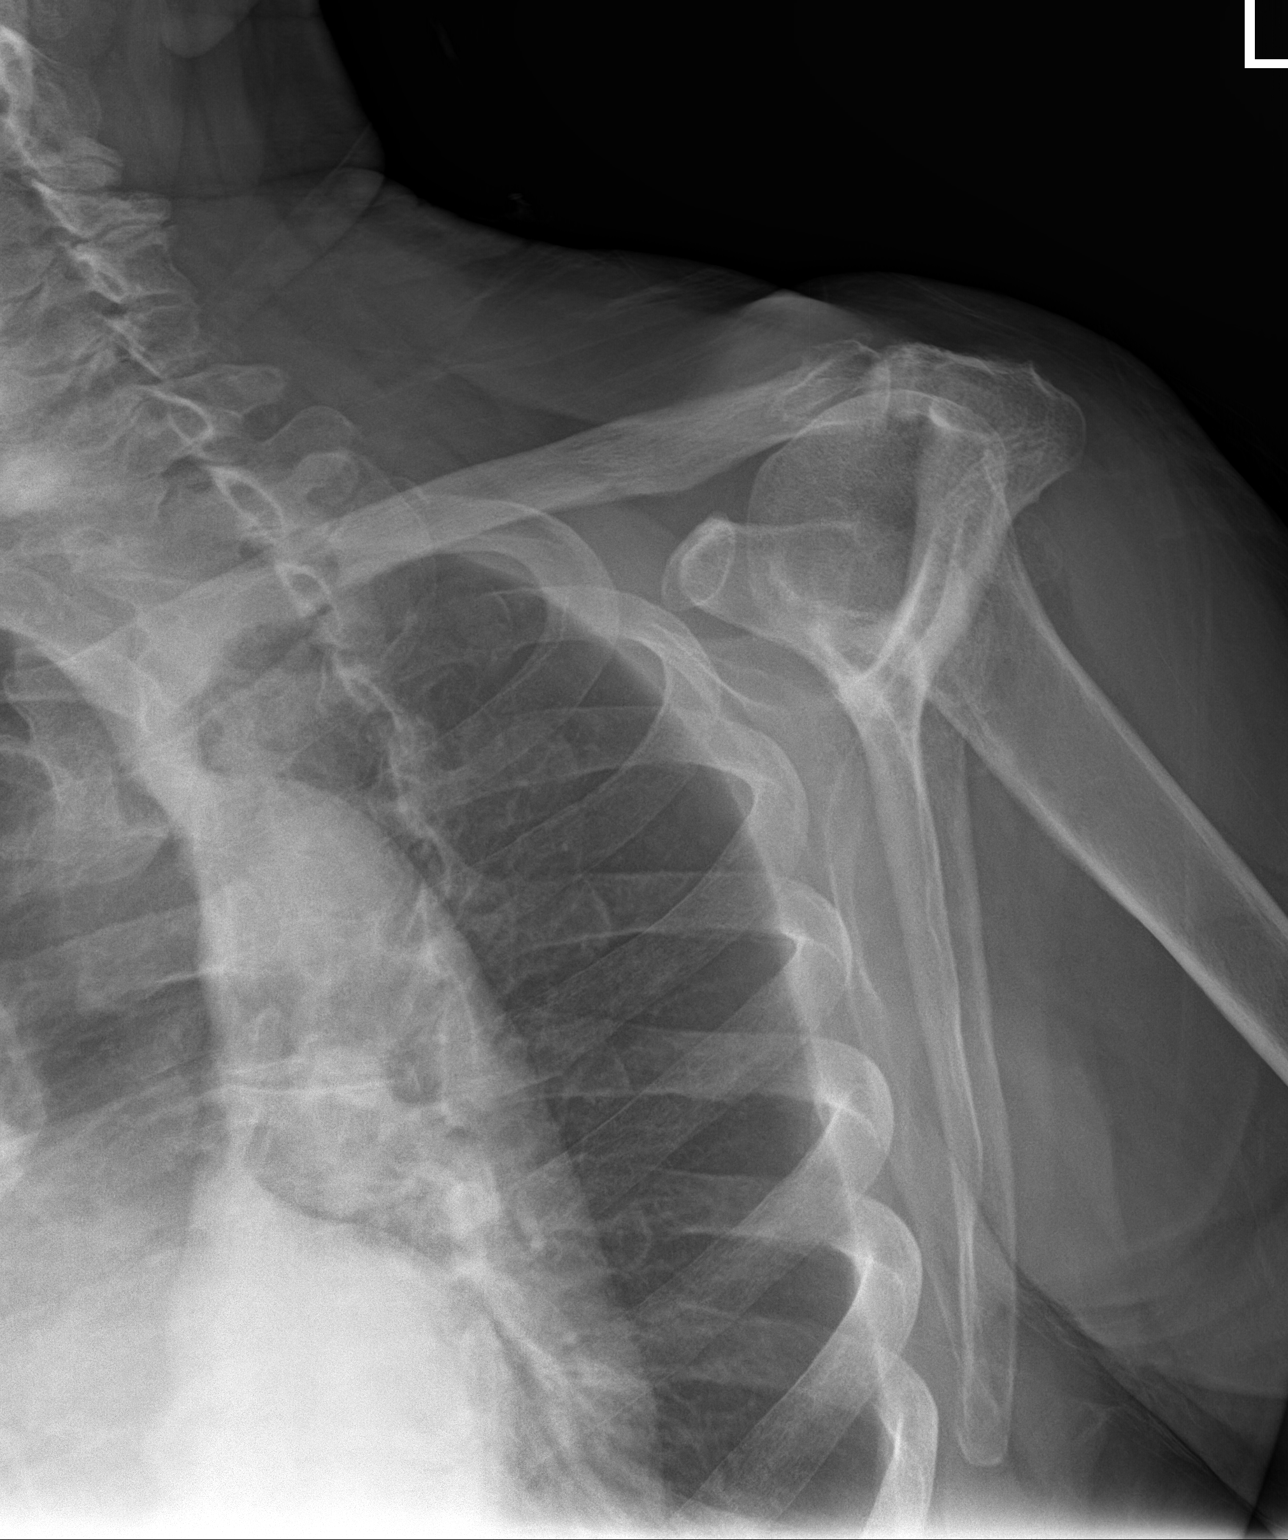

[shoulder axial]
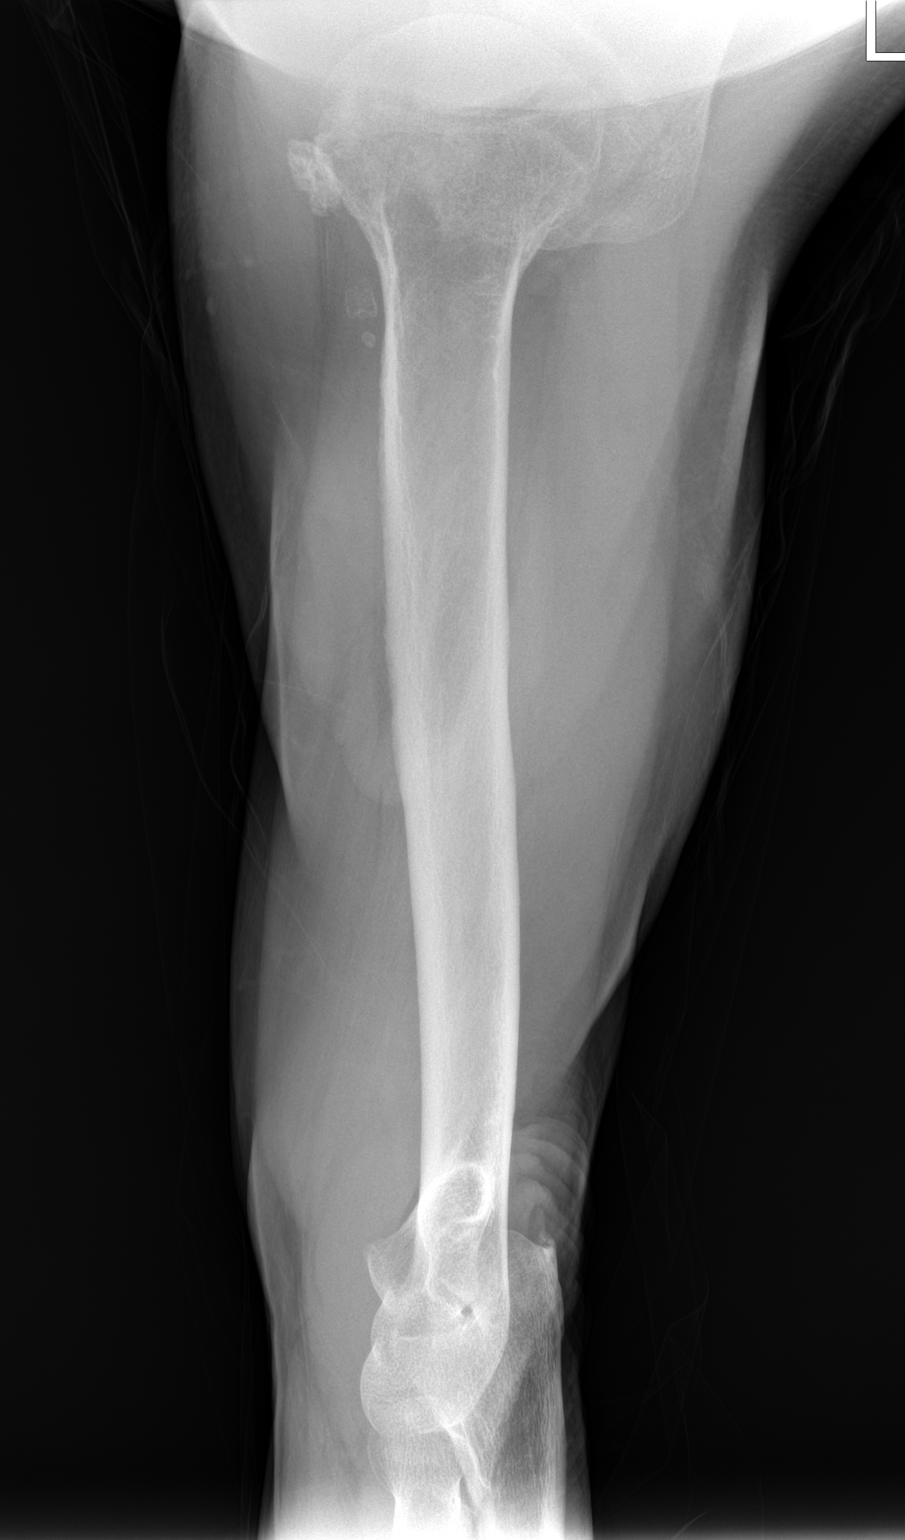

[3 of 3 positions shown; findings below may reference images not displayed]

FINDINGS: No acute fracture or dislocation. High humeral head suggesting
chronic rotator cuff tendinopathy. Degenerative spurring at the
acromioclavicular and glenohumeral joints with ossified bodies in
the anterior shoulder.
IMPRESSION: 1. No acute finding.
2. Glenohumeral and acromioclavicular osteoarthritis with ossified
bodies. High positioning of the humeral head suggests chronic
rotator cuff tear.

## 2021-06-07 MED ORDER — METHYLPREDNISOLONE ACETATE 80 MG/ML IJ SUSP
60.0000 mg | Freq: Once | INTRAMUSCULAR | Status: AC
  Administered 2021-06-07: 60 mg via INTRAMUSCULAR

## 2021-06-07 MED ORDER — DICLOFENAC SODIUM 1 % EX GEL: 2.0000 g | Freq: Four times a day (QID) | CUTANEOUS | 0 refills | Status: DC

## 2021-06-07 NOTE — Addendum Note (Signed)
Addended by: Geryl Rankins D on: 06/07/2021 10:00 AM   Modules accepted: Orders

## 2021-06-07 NOTE — Progress Notes (Signed)
Subjective:  Patient ID: Jacob Sanders, male    DOB: [birth year], 76 y.o.   MRN: [id number]  Patient Care Team: Sharion Balloon, FNP as PCP - General (Nurse Practitioner) Wellington Hampshire, MD as PCP - Cardiology (Cardiology) Alroy Dust, MD as Consulting Physician (Ophthalmology)   Chief Complaint:  left shoulder pain (Stroke related)   HPI: Jacob Sanders is a 76 y.o. male presenting on 06/07/2021 for left shoulder pain (Stroke related)   Shoulder Pain  The pain is present in the left shoulder and left arm. This is a chronic problem. Episode onset: December 2022. There has been no history of extremity trauma. The problem occurs constantly. The problem has been waxing and waning. The pain is at a severity of 6/10. The pain is moderate. Associated symptoms include joint locking, a limited range of motion, stiffness and tingling. Pertinent negatives include no fever, inability to bear weight, itching, joint swelling or numbness. The symptoms are aggravated by activity. He has tried acetaminophen for the symptoms. The treatment provided no relief. No prior imaging.    Relevant past medical, surgical, family, and social history reviewed and updated as indicated.  Allergies and medications reviewed and updated. Data reviewed: Chart in Epic.   Past Medical History:  Diagnosis Date   Arthritis    Atrial fibrillation (Pump Back)    Dysrhythmia    afib   GERD (gastroesophageal reflux disease)    History of kidney stones    passed   Hyperlipidemia    Hypertension    Nerve damage    left hand after accident- 1995   Seasonal allergies    Staph infection    post back surgeries 1998 and1999    Past Surgical History:  Procedure Laterality Date   BACK SURGERY  3295,1884   discectomy   HAND SURGERY Left 1995   OPEN REDUCTION INTERNAL FIXATION (ORIF) DISTAL RADIAL FRACTURE Left 07/29/2019   Procedure: OPEN REDUCTION INTERNAL FIXATION (ORIF) LEFT DISTAL RADIAL FRACTURE;  Surgeon:  Roseanne Kaufman, MD;  Location: [place] [facility];  Service: Orthopedics;  Laterality: Left;  90 mins   TONSILLECTOMY     childhood    TOOTH EXTRACTION  3 weeks ago ;   had abcess beneath tooth; experiened some bleeding for 20 hours post op   TOTAL HIP ARTHROPLASTY Left 05/22/2016   Procedure: LEFT TOTAL HIP ARTHROPLASTY ANTERIOR APPROACH;  Surgeon: Rod Can, MD;  Location: [facility];  Service: Orthopedics;  Laterality: Left;  Dr. requesting RNFA   TOTAL HIP ARTHROPLASTY Right 07/17/2016   Procedure: RIGHT TOTAL HIP ARTHROPLASTY ANTERIOR APPROACH;  Surgeon: Rod Can, MD;  Location: [facility];  Service: Orthopedics;  Laterality: Right;  needs RNFA    Social History   Socioeconomic History   Marital status: Married    Spouse name: Anne Ng   Number of children: 3   Years of education: 10   Highest education level: 10th grade  Occupational History   Occupation: Retired  Tobacco Use   Smoking status: Every Day    Packs/day: 1.00    Years: 45.00    Pack years: 45.00    Types: Cigars, Cigarettes   Smokeless tobacco: Never   Tobacco comments:    4-5 daily  Vaping Use   Vaping Use: Never used  Substance and Sexual Activity   Alcohol use: No   Drug use: No   Sexual activity: Not Currently  Other Topics Concern   Not on file  Social History Narrative   [employer]  [employer]   Social Determinants of Health   Financial Resource Strain: Low Risk    Difficulty of Paying Living Expenses: Not hard at all  Food Insecurity: No Food Insecurity   Worried About Charity fundraiser in the Last Year: Never true   Ran Out of Food in the Last Year: Never true  Transportation Needs: No Transportation Needs   Lack of Transportation (Medical): No   Lack of Transportation (Non-Medical): No  Physical Activity: Insufficiently Active   Days of Exercise per Week: 7 days   Minutes of Exercise per Session: 10 min  Stress: No Stress Concern Present   Feeling of Stress : Not at all  Social Connections: Socially  Integrated   Frequency of Communication with Friends and Family: More than three times a week   Frequency of Social Gatherings with Friends and Family: Three times a week   Attends Religious Services: More than 4 times per year   Active Member of Clubs or Organizations: Yes   Attends Archivist Meetings: More than 4 times per year   Marital Status: Married  Human resources officer Violence: Not At Risk   Fear of Current or Ex-Partner: No   Emotionally Abused: No   Physically Abused: No   Sexually Abused: No    Outpatient Encounter Medications as of 06/07/2021  Medication Sig   apixaban (ELIQUIS) 5 MG TABS tablet Take 1 tablet (5 mg total) by mouth 2 (two) times daily.   Budeson-Glycopyrrol-Formoterol (BREZTRI AEROSPHERE) 160-9-4.8 MCG/ACT AERO Inhale 2 puffs into the lungs 2 (two) times daily.   busPIRone (BUSPAR) 5 MG tablet TAKE 1 TABLET BY MOUTH TWICE  DAILY   diclofenac Sodium (VOLTAREN) 1 % GEL Apply 2 g topically 4 (four) times daily.   diltiazem (CARDIZEM CD) 300 MG 24 hr capsule Take 1 capsule (300 mg total) by mouth daily.   meclizine (ANTIVERT) 25 MG tablet TAKE 1 TABLET BY MOUTH 3 TIMES  DAILY AS NEEDED FOR DIZZINESS   omeprazole (PRILOSEC) 20 MG capsule TAKE 1 CAPSULE BY MOUTH DAILY AS NEEDED   sodium chloride (OCEAN) 0.65 % SOLN nasal spray Place 1 spray into both nostrils as needed for congestion.   atorvastatin (LIPITOR) 20 MG tablet Take 1 tablet (20 mg total) by mouth daily.   No facility-administered encounter medications on file as of 06/07/2021.    Allergies  Allergen Reactions   Warfarin And Related Other (See Comments)    Bleeding.   Morphine And Related     Agitation    Prednisone Swelling    Review of Systems  Constitutional:  Negative for activity change, appetite change, chills, diaphoresis, fatigue, fever and unexpected weight change.  HENT: Negative.    Eyes: Negative.   Respiratory:  Negative for cough, chest tightness and shortness of breath.    Cardiovascular:  Negative for chest pain, palpitations and leg swelling.  Gastrointestinal:  Negative for blood in stool, constipation, diarrhea, nausea and vomiting.  Endocrine: Negative.   Genitourinary:  Negative for decreased urine volume, dysuria, frequency and urgency.  Musculoskeletal:  Positive for arthralgias, myalgias and stiffness. Negative for back pain, gait problem, joint swelling, neck pain and neck stiffness.  Skin: Negative.  Negative for itching.  Allergic/Immunologic: Negative.   Neurological:  Positive for tingling and weakness (left arm from prior CVA). Negative for dizziness, numbness and headaches.  Hematological: Negative.   Psychiatric/Behavioral:  Negative for confusion, hallucinations, sleep disturbance and suicidal ideas.   All other systems reviewed and are negative.  Objective:  BP (!) 145/89   Pulse 95   Temp (!) 97.4 F (36.3 C)   Ht _0  (1.778 m)   Wt 199 lb (90.3 kg)   SpO2 96%   BMI 28.55 kg/m    Wt Readings from Last 3 Encounters:  06/07/21 199 lb (90.3 kg)  06/03/21 197 lb (89.4 kg)  04/09/21 197 lb (89.4 kg)    Physical Exam Vitals and nursing note reviewed.  Constitutional:      General: He is not in acute distress.    Appearance: Normal appearance. He is well-developed, well-groomed and overweight. He is not ill-appearing, toxic-appearing or diaphoretic.  HENT:     Head: Normocephalic and atraumatic.     Jaw: There is normal jaw occlusion.     Right Ear: Hearing normal.     Left Ear: Hearing normal.     Nose: Nose normal.     Mouth/Throat:     Lips: Pink.     Mouth: Mucous membranes are moist.     Pharynx: Uvula midline.  Eyes:     General: Lids are normal.     Pupils: Pupils are equal, round, and reactive to light.  Neck:     Thyroid: No thyroid mass, thyromegaly or thyroid tenderness.     Vascular: No carotid bruit or JVD.     Trachea: Trachea and phonation normal.  Cardiovascular:     Rate and Rhythm: Normal  rate and regular rhythm.     Chest Wall: PMI is not displaced.     Pulses: Normal pulses.     Heart sounds: Normal heart sounds. No murmur heard.   No friction rub. No gallop.  Pulmonary:     Effort: Pulmonary effort is normal. No respiratory distress.     Breath sounds: Normal breath sounds. No wheezing.  Abdominal:     General: There is no abdominal bruit.     Palpations: There is no hepatomegaly or splenomegaly.  Musculoskeletal:     Left shoulder: Tenderness present. No swelling, deformity, effusion, laceration, bony tenderness or crepitus. Decreased range of motion. Decreased strength. Normal pulse.     Left upper arm: Normal.     Cervical back: Normal, normal range of motion and neck supple.     Right lower leg: No edema.     Left lower leg: No edema.  Lymphadenopathy:     Cervical: No cervical adenopathy.  Skin:    General: Skin is warm and dry.     Capillary Refill: Capillary refill takes less than 2 seconds.     Coloration: Skin is not cyanotic, jaundiced or pale.     Findings: No rash.  Neurological:     General: No focal deficit present.     Mental Status: He is alert and oriented to person, place, and time.     Sensory: Sensation is intact.     Motor: Motor function is intact.     Coordination: Coordination is intact.     Gait: Gait is intact.     Deep Tendon Reflexes: Reflexes are normal and symmetric.  Psychiatric:        Attention and Perception: Attention and perception normal.        Mood and Affect: Mood and affect normal.        Speech: Speech normal.        Behavior: Behavior normal. Behavior is cooperative.        Thought Content: Thought content normal.        Cognition  and Memory: Cognition and memory normal.        Judgment: Judgment normal.    Results for orders placed or performed in visit on 03/26/21  Microscopic Examination   Urine  Result Value Ref Range   WBC, UA None seen 0 - 5 /hpf   RBC 0-2 0 - 2 /hpf   Epithelial Cells (non renal) 0-10  0 - 10 /hpf   Renal Epithel, UA None seen None seen /hpf   Bacteria, UA None seen None seen/Few  CMP14+EGFR  Result Value Ref Range   Glucose 105 (H) 70 - 99 mg/dL   BUN 17 8 - 27 mg/dL   Creatinine, Ser 0.94 0.76 - 1.27 mg/dL   eGFR 86 >59 mL/min/1.73   BUN/Creatinine Ratio 18 10 - 24   Sodium 143 134 - 144 mmol/L   Potassium 4.9 3.5 - 5.2 mmol/L   Chloride 106 96 - 106 mmol/L   CO2 23 20 - 29 mmol/L   Calcium 9.5 8.6 - 10.2 mg/dL   Total Protein 7.2 6.0 - 8.5 g/dL   Albumin 4.6 3.7 - 4.7 g/dL   Globulin, Total 2.6 1.5 - 4.5 g/dL   Albumin/Globulin Ratio 1.8 1.2 - 2.2   Bilirubin Total 0.5 0.0 - 1.2 mg/dL   Alkaline Phosphatase 83 44 - 121 IU/L   AST 13 0 - 40 IU/L   ALT 9 0 - 44 IU/L  CBC with Differential/Platelet  Result Value Ref Range   WBC 10.3 3.4 - 10.8 x10E3/uL   RBC 5.35 4.14 - 5.80 x10E6/uL   Hemoglobin 16.3 13.0 - 17.7 g/dL   Hematocrit 49.8 37.5 - 51.0 %   MCV 93 79 - 97 fL   MCH 30.5 26.6 - 33.0 pg   MCHC 32.7 31.5 - 35.7 g/dL   RDW 14.1 11.6 - 15.4 %   Platelets 260 150 - 450 x10E3/uL   Neutrophils 77 Not Estab. %   Lymphs 14 Not Estab. %   Monocytes 7 Not Estab. %   Eos 1 Not Estab. %   Basos 1 Not Estab. %   Neutrophils Absolute 8.0 (H) 1.4 - 7.0 x10E3/uL   Lymphocytes Absolute 1.5 0.7 - 3.1 x10E3/uL   Monocytes Absolute 0.8 0.1 - 0.9 x10E3/uL   EOS (ABSOLUTE) 0.1 0.0 - 0.4 x10E3/uL   Basophils Absolute 0.1 0.0 - 0.2 x10E3/uL   Immature Granulocytes 0 Not Estab. %   Immature Grans (Abs) 0.0 0.0 - 0.1 x10E3/uL  Urinalysis, Complete  Result Value Ref Range   Specific Gravity, UA >1.030 (H) 1.005 - 1.030   pH, UA 5.0 5.0 - 7.5   Color, UA Yellow Yellow   Appearance Ur Clear Clear   Leukocytes,UA Negative Negative   Protein,UA Trace (A) Negative/Trace   Glucose, UA Negative Negative   Ketones, UA Trace (A) Negative   RBC, UA Trace (A) Negative   Bilirubin, UA Negative Negative   Urobilinogen, Ur 1.0 0.2 - 1.0 mg/dL   Nitrite, UA Negative  Negative   Microscopic Examination See below:      X-Ray: left shoulder: concerning for lesion on humerus and humeral head fracture, noted osteoarthritis changes, sent for STAT read due to concerns. Preliminary x-ray reading by Monia Pouch, FNP-C, [facility].   Pertinent labs & imaging results that were available during my care of the patient were reviewed by me and considered in my medical decision making.  Assessment & Plan:  Jayvier was seen today for left shoulder pain.  Diagnoses and all orders  for this visit:  Chronic left shoulder pain Primary osteoarthritis of left shoulder Radiologist reading reports osteoarthritis changes and high humeral head due to probable chronic rotator cuff tear. Imaging concerning in office. Will refer to ortho urgently. Referral to PT. Diclofenac gel as prescribed. Pt aware to report any new, worsening, or persistent symptoms.  -     DG Shoulder Left -     Ambulatory referral to Physical Therapy -     Ambulatory referral to Orthopedic Surgery -     diclofenac Sodium (VOLTAREN) 1 % GEL; Apply 2 g topically 4 (four) times daily.    Continue all other maintenance medications.  Follow up plan: Return if symptoms worsen or fail to improve.   Continue healthy lifestyle choices, including diet (rich in fruits, vegetables, and lean proteins, and low in salt and simple carbohydrates) and exercise (at least 30 minutes of moderate physical activity daily).  Educational handout given for chronic shoulder pain, rotator cuff tear  The above assessment and management plan was discussed with the patient. The patient verbalized understanding of and has agreed to the management plan. Patient is aware to call the clinic if they develop any new symptoms or if symptoms persist or worsen. Patient is aware when to return to the clinic for a follow-up visit. Patient educated on when it is appropriate to go to the emergency department.   Monia Pouch, FNP-C [facility] [phone]

## 2021-06-20 ENCOUNTER — Other Ambulatory Visit: Payer: Self-pay | Admitting: Cardiovascular Disease

## 2021-06-20 DIAGNOSIS — M25812 Other specified joint disorders, left shoulder: Secondary | ICD-10-CM | POA: Diagnosis not present

## 2021-06-20 DIAGNOSIS — M25512 Pain in left shoulder: Secondary | ICD-10-CM | POA: Diagnosis not present

## 2021-06-20 DIAGNOSIS — I10 Essential (primary) hypertension: Secondary | ICD-10-CM

## 2021-06-21 NOTE — Telephone Encounter (Signed)
Refill request

## 2021-06-25 ENCOUNTER — Ambulatory Visit (INDEPENDENT_AMBULATORY_CARE_PROVIDER_SITE_OTHER): Payer: Medicare Other | Admitting: Family

## 2021-06-25 ENCOUNTER — Other Ambulatory Visit: Payer: Self-pay | Admitting: Cardiovascular Disease

## 2021-06-25 ENCOUNTER — Encounter: Payer: Self-pay | Admitting: Family

## 2021-06-25 VITALS — BP 127/90 | HR 63 | Temp 97.7°F | Ht 70.0 in | Wt 195.4 lb

## 2021-06-25 DIAGNOSIS — K219 Gastro-esophageal reflux disease without esophagitis: Secondary | ICD-10-CM | POA: Diagnosis not present

## 2021-06-25 DIAGNOSIS — M19012 Primary osteoarthritis, left shoulder: Secondary | ICD-10-CM | POA: Diagnosis not present

## 2021-06-25 DIAGNOSIS — I10 Essential (primary) hypertension: Secondary | ICD-10-CM

## 2021-06-25 DIAGNOSIS — B079 Viral wart, unspecified: Secondary | ICD-10-CM | POA: Diagnosis not present

## 2021-06-25 DIAGNOSIS — L821 Other seborrheic keratosis: Secondary | ICD-10-CM | POA: Diagnosis not present

## 2021-06-25 DIAGNOSIS — I4891 Unspecified atrial fibrillation: Secondary | ICD-10-CM | POA: Diagnosis not present

## 2021-06-25 DIAGNOSIS — E663 Overweight: Secondary | ICD-10-CM

## 2021-06-25 DIAGNOSIS — E559 Vitamin D deficiency, unspecified: Secondary | ICD-10-CM | POA: Diagnosis not present

## 2021-06-25 DIAGNOSIS — M1990 Unspecified osteoarthritis, unspecified site: Secondary | ICD-10-CM

## 2021-06-25 DIAGNOSIS — E785 Hyperlipidemia, unspecified: Secondary | ICD-10-CM | POA: Diagnosis not present

## 2021-06-25 DIAGNOSIS — Z23 Encounter for immunization: Secondary | ICD-10-CM

## 2021-06-25 DIAGNOSIS — K5900 Constipation, unspecified: Secondary | ICD-10-CM

## 2021-06-25 DIAGNOSIS — Z87891 Personal history of nicotine dependence: Secondary | ICD-10-CM | POA: Diagnosis not present

## 2021-06-25 DIAGNOSIS — F411 Generalized anxiety disorder: Secondary | ICD-10-CM

## 2021-06-25 MED ORDER — OMEPRAZOLE 20 MG PO CPDR: 20.0000 mg | DELAYED_RELEASE_CAPSULE | Freq: Every day | ORAL | 1 refills | Status: DC | PRN

## 2021-06-25 NOTE — Telephone Encounter (Signed)
Refill Request.  

## 2021-06-25 NOTE — Patient Instructions (Signed)
Health Maintenance After Age 74 After age 74, you are at a higher risk for certain long-term diseases and infections as well as injuries from falls. Falls are a major cause of broken bones and head injuries in people who are older than age 74. Getting regular preventive care can help to keep you healthy and well. Preventive care includes getting regular testing and making lifestyle changes as recommended by your health care provider. Talk with your health care provider about: Which screenings and tests you should have. A screening is a test that checks for a disease when you have no symptoms. A diet and exercise plan that is right for you. What should I know about screenings and tests to prevent falls? Screening and testing are the best ways to find a health problem early. Early diagnosis and treatment give you the best chance of managing medical conditions that are common after age 74. Certain conditions and lifestyle choices may make you more likely to have a fall. Your health care provider may recommend: Regular vision checks. Poor vision and conditions such as cataracts can make you more likely to have a fall. If you wear glasses, make sure to get your prescription updated if your vision changes. Medicine review. Work with your health care provider to regularly review all of the medicines you are taking, including over-the-counter medicines. Ask your health care provider about any side effects that may make you more likely to have a fall. Tell your health care provider if any medicines that you take make you feel dizzy or sleepy. Strength and balance checks. Your health care provider may recommend certain tests to check your strength and balance while standing, walking, or changing positions. Foot health exam. Foot pain and numbness, as well as not wearing proper footwear, can make you more likely to have a fall. Screenings, including: Osteoporosis screening. Osteoporosis is a condition that causes  the bones to get weaker and break more easily. Blood pressure screening. Blood pressure changes and medicines to control blood pressure can make you feel dizzy. Depression screening. You may be more likely to have a fall if you have a fear of falling, feel depressed, or feel unable to do activities that you used to do. Alcohol use screening. Using too much alcohol can affect your balance and may make you more likely to have a fall. Follow these instructions at home: Lifestyle Do not drink alcohol if: Your health care provider tells you not to drink. If you drink alcohol: Limit how much you have to: 0-1 drink a day for women. 0-2 drinks a day for men. Know how much alcohol is in your drink. In the [place] one drink equals one 12 oz bottle of beer (355 mL), one 5 oz glass of wine (148 mL), or one 1 oz glass of hard liquor (44 mL). Do not use any products that contain nicotine or tobacco. These products include cigarettes, chewing tobacco, and vaping devices, such as e-cigarettes. If you need help quitting, ask your health care provider. Activity  Follow a regular exercise program to stay fit. This will help you maintain your balance. Ask your health care provider what types of exercise are appropriate for you. If you need a cane or walker, use it as recommended by your health care provider. Wear supportive shoes that have nonskid soles. Safety  Remove any tripping hazards, such as rugs, cords, and clutter. Install safety equipment such as grab bars in bathrooms and safety rails on stairs. Keep rooms and walkways   well-lit. General instructions Talk with your health care provider about your risks for falling. Tell your health care provider if: You fall. Be sure to tell your health care provider about all falls, even ones that seem minor. You feel dizzy, tiredness (fatigue), or off-balance. Take over-the-counter and prescription medicines only as told by your health care provider. These include  supplements. Eat a healthy diet and maintain a healthy weight. A healthy diet includes low-fat dairy products, low-fat (lean) meats, and fiber from whole grains, beans, and lots of fruits and vegetables. Stay current with your vaccines. Schedule regular health, dental, and eye exams. Summary Having a healthy lifestyle and getting preventive care can help to protect your health and wellness after age 74. Screening and testing are the best way to find a health problem early and help you avoid having a fall. Early diagnosis and treatment give you the best chance for managing medical conditions that are more common for people who are older than age 74. Falls are a major cause of broken bones and head injuries in people who are older than age 74. Take precautions to prevent a fall at home. Work with your health care provider to learn what changes you can make to improve your health and wellness and to prevent falls. This information is not intended to replace advice given to you by your health care provider. Make sure you discuss any questions you have with your health care provider. Document Revised: 05/21/2020 Document Reviewed: 05/21/2020 Elsevier Patient Education  2023 [employer]  

## 2021-06-25 NOTE — Progress Notes (Signed)
Subjective:    Patient ID: Jacob Sanders, male    DOB: May 14, 1945, 76 y.o.   MRN: [id number]  Chief Complaint  Patient presents with   Medical Management of Chronic Issues   Pt calls the office today for chronic follow up.  He see's the [place] once a year.  He is followed by Ortho for left rotor cuff tear and was given injection.   He has hx of CVA with mild left arm weakness.    He is followed Cardiologists every 3 months for A Fib.  Has COPE and  he quit smoking 04/2021. He takes Librarian, academic as needed. Denies any SOB.   He reports he had a skin lesion on the knuckle of left hand that he noticed two weeks ago. Denies any pain or redness.  Hypertension This is a chronic problem. The current episode started more than 1 year ago. The problem has been resolved since onset. The problem is controlled. Associated symptoms include anxiety and malaise/fatigue. Pertinent negatives include no peripheral edema or shortness of breath. Risk factors for coronary artery disease include dyslipidemia, obesity and male gender. The current treatment provides moderate improvement. Hypertensive end-organ damage includes CVA. There is no history of heart failure.  Gastroesophageal Reflux He complains of belching and heartburn. The current episode started more than 1 year ago. The problem occurs occasionally. He has tried a PPI for the symptoms. The treatment provided moderate relief.  Arthritis Presents for follow-up visit. He complains of pain and stiffness. The symptoms have been worsening. Affected locations include the right knee, left knee, right shoulder and left shoulder. His pain is at a severity of 3/10.  Hyperlipidemia This is a chronic problem. The current episode started more than 1 year ago. The problem is controlled. Pertinent negatives include no shortness of breath. Current antihyperlipidemic treatment includes statins. The current treatment provides moderate improvement of lipids. Risk factors for  coronary artery disease include dyslipidemia, male sex, hypertension and a sedentary lifestyle.  Anxiety Presents for follow-up visit. Symptoms include depressed mood, excessive worry and nervous/anxious behavior. Patient reports no shortness of breath. Symptoms occur occasionally.    Constipation This is a chronic problem. The current episode started more than 1 year ago. The problem has been waxing and waning since onset. His stool frequency is 1 time per day.      Review of Systems  Constitutional:  Positive for malaise/fatigue.  Respiratory:  Negative for shortness of breath.   Gastrointestinal:  Positive for constipation and heartburn.  Musculoskeletal:  Positive for arthritis and stiffness.  Psychiatric/Behavioral:  The patient is nervous/anxious.   All other systems reviewed and are negative.      Objective:   Physical Exam Vitals reviewed.  Constitutional:      General: He is not in acute distress.    Appearance: He is well-developed. He is obese.  HENT:     Head: Normocephalic.     Right Ear: Tympanic membrane normal.     Left Ear: Tympanic membrane normal.  Eyes:     General:        Right eye: No discharge.        Left eye: No discharge.     Pupils: Pupils are equal, round, and reactive to light.  Neck:     Thyroid: No thyromegaly.  Cardiovascular:     Rate and Rhythm: Normal rate and regular rhythm.     Heart sounds: Normal heart sounds. No murmur heard. Pulmonary:     Effort: Pulmonary  effort is normal. No respiratory distress.     Breath sounds: Normal breath sounds. No wheezing.  Abdominal:     General: Bowel sounds are normal. There is no distension.     Palpations: Abdomen is soft.     Tenderness: There is no abdominal tenderness.  Musculoskeletal:        General: No tenderness. Normal range of motion.     Cervical back: Normal range of motion and neck supple.  Skin:    General: Skin is warm and dry.     Findings: Lesion present. No erythema or  rash.          Comments: Wart on left knuckle  Multiple seborrheic keratosis  Neurological:     Mental Status: He is alert and oriented to person, place, and time.     Cranial Nerves: No cranial nerve deficit.     Deep Tendon Reflexes: Reflexes are normal and symmetric.  Psychiatric:        Behavior: Behavior normal.        Thought Content: Thought content normal.        Judgment: Judgment normal.   Cryotherapy used on left hand for wart. Pt tolerated well.   Cryotherapy used on back on 3 different seborrheic keratosis. Pt tolerated well.   BP 127/90   Pulse 63   Temp 97.7 F (36.5 C) (Temporal)   Ht 5\' 10"  (1.778 m)   Wt 195 lb 6.4 oz (88.6 kg)   SpO2 96%   BMI 28.04 kg/m      Assessment & Plan:  Jacob Sanders comes in today with chief complaint of Medical Management of Chronic Issues   Diagnosis and orders addressed:  1. Primary hypertension  2. Atrial fibrillation, unspecified type ([place])  3. Gastroesophageal reflux disease, unspecified whether esophagitis present - omeprazole (PRILOSEC) 20 MG capsule; Take 1 capsule (20 mg total) by mouth daily as needed.  Dispense: 90 capsule; Refill: 1  4. Primary osteoarthritis of left shoulder  5. Arthritis  6. Vitamin D deficiency  7. Overweight (BMI 25.0-29.9)  8. Hyperlipidemia, unspecified hyperlipidemia type  9. GAD (generalized anxiety disorder)  10. Former smoker  70. Constipation, unspecified constipation type  12. Need for prophylactic vaccination with tetanus-diphtheria (Td) - Td : Tetanus/diphtheria >7yo Preservative  free  13. Viral warts, unspecified type  14. Seborrheic keratosis -If blisters do not pick    Labs reviewed from [place] from last month. Will scan into chart Health Maintenance reviewed Diet and exercise encouraged  Follow up plan: 3 months   Evelina Dun, FNP

## 2021-06-27 ENCOUNTER — Other Ambulatory Visit: Payer: Self-pay | Admitting: Family

## 2021-06-27 DIAGNOSIS — K219 Gastro-esophageal reflux disease without esophagitis: Secondary | ICD-10-CM

## 2021-06-27 DIAGNOSIS — F411 Generalized anxiety disorder: Secondary | ICD-10-CM

## 2021-08-14 ENCOUNTER — Encounter: Payer: Self-pay | Admitting: Family Medicine

## 2021-08-14 ENCOUNTER — Ambulatory Visit (INDEPENDENT_AMBULATORY_CARE_PROVIDER_SITE_OTHER): Payer: Medicare Other | Admitting: Family Medicine

## 2021-08-14 DIAGNOSIS — J441 Chronic obstructive pulmonary disease with (acute) exacerbation: Secondary | ICD-10-CM

## 2021-08-14 MED ORDER — METHYLPREDNISOLONE 4 MG PO TBPK: ORAL_TABLET | ORAL | 0 refills | Status: DC

## 2021-08-14 NOTE — Progress Notes (Signed)
Virtual Visit  Note Due to COVID-19 pandemic this visit was conducted virtually. This visit type was conducted due to national recommendations for restrictions regarding the COVID-19 Pandemic (e.g. social distancing, sheltering in place) in an effort to limit this patient's exposure and mitigate transmission in our community. All issues noted in this document were discussed and addressed.  A physical exam was not performed with this format.  I connected with Jacob Sanders on 08/14/21 at 1335 by telephone and verified that I am speaking with the correct person using two identifiers. Jacob Sanders is currently located at home and his wife is currently with him during the visit. The provider, Gabriel Earing, FNP is located in their office at time of visit.  I discussed the limitations, risks, security and privacy concerns of performing an evaluation and management service by telephone and the availability of in person appointments. I also discussed with the patient that there may be a patient responsible charge related to this service. The patient expressed understanding and agreed to proceed.  CC: cough  History and Present Illness:  Cough This is a new problem. The current episode started 1 to 4 weeks ago. The problem has been gradually worsening. The cough is Productive of sputum. Associated symptoms include headaches, myalgias and nasal congestion. Pertinent negatives include no chest pain, chills, ear congestion, ear pain, fever, heartburn, hemoptysis, postnasal drip, rash, rhinorrhea, sore throat, shortness of breath, sweats, weight loss or wheezing. Risk factors for lung disease include smoking/tobacco exposure. He has tried OTC cough suppressant and rest for the symptoms. The treatment provided mild relief. His past medical history is significant for COPD.   He has had a negative Covid test. He previously has been treated with a medrol dosepak for similar symptoms with significant  improvement.    Review of Systems  Constitutional:  Negative for chills, fever and weight loss.  HENT:  Negative for ear pain, postnasal drip, rhinorrhea and sore throat.   Respiratory:  Positive for cough. Negative for hemoptysis, shortness of breath and wheezing.   Cardiovascular:  Negative for chest pain.  Gastrointestinal:  Negative for heartburn.  Musculoskeletal:  Positive for myalgias.  Skin:  Negative for rash.  Neurological:  Positive for headaches.     Observations/Objective: Alert and oriented x 3. Able to speak in full sentences without difficulty.   Assessment and Plan: Diagnoses and all orders for this visit:  COPD exacerbation (HCC) Medrol dosepak ordered. Mucinex BID. Rest, hydration. Return to office for new or worsening symptoms, or if symptoms persist.  -     methylPREDNISolone (MEDROL DOSEPAK) 4 MG TBPK tablet; Use as directed     Follow Up Instructions: Return to office for new or worsening symptoms, or if symptoms persist.      I discussed the assessment and treatment plan with the patient. The patient was provided an opportunity to ask questions and all were answered. The patient agreed with the plan and demonstrated an understanding of the instructions.   The patient was advised to call back or seek an in-person evaluation if the symptoms worsen or if the condition fails to improve as anticipated.  The above assessment and management plan was discussed with the patient. The patient verbalized understanding of and has agreed to the management plan. Patient is aware to call the clinic if symptoms persist or worsen. Patient is aware when to return to the clinic for a follow-up visit. Patient educated on when it is appropriate to go to  the emergency department.   Time call ended:  1347  I provided 12 minutes of  non face-to-face time during this encounter.    Gabriel Earing, FNP

## 2021-08-17 ENCOUNTER — Other Ambulatory Visit: Payer: Self-pay | Admitting: Family

## 2021-08-17 DIAGNOSIS — F411 Generalized anxiety disorder: Secondary | ICD-10-CM

## 2021-08-17 DIAGNOSIS — K219 Gastro-esophageal reflux disease without esophagitis: Secondary | ICD-10-CM

## 2021-08-20 DIAGNOSIS — H04203 Unspecified epiphora, bilateral lacrimal glands: Secondary | ICD-10-CM | POA: Diagnosis not present

## 2021-08-20 DIAGNOSIS — H02132 Senile ectropion of right lower eyelid: Secondary | ICD-10-CM | POA: Diagnosis not present

## 2021-08-20 DIAGNOSIS — H02135 Senile ectropion of left lower eyelid: Secondary | ICD-10-CM | POA: Diagnosis not present

## 2021-08-21 ENCOUNTER — Ambulatory Visit (INDEPENDENT_AMBULATORY_CARE_PROVIDER_SITE_OTHER): Payer: Medicare Other | Admitting: Family Medicine

## 2021-08-21 ENCOUNTER — Telehealth: Payer: Self-pay | Admitting: Family

## 2021-08-21 ENCOUNTER — Encounter: Payer: Self-pay | Admitting: Family Medicine

## 2021-08-21 DIAGNOSIS — U071 COVID-19: Secondary | ICD-10-CM | POA: Diagnosis not present

## 2021-08-21 MED ORDER — GUAIFENESIN ER 600 MG PO TB12: 600.0000 mg | ORAL_TABLET | Freq: Two times a day (BID) | ORAL | 0 refills | Status: AC

## 2021-08-21 MED ORDER — MOLNUPIRAVIR EUA 200MG CAPSULE: 4.0000 | ORAL_CAPSULE | Freq: Two times a day (BID) | ORAL | 0 refills | Status: AC

## 2021-08-21 NOTE — Progress Notes (Signed)
Virtual Visit via telephone Note Due to COVID-19 pandemic this visit was conducted virtually. This visit type was conducted due to national recommendations for restrictions regarding the COVID-19 Pandemic (e.g. social distancing, sheltering in place) in an effort to limit this patient's exposure and mitigate transmission in our community. All issues noted in this document were discussed and addressed.  A physical exam was not performed with this format.   I connected with Jacob Sanders on 08/21/2021 at 1330 by telephone and verified that I am speaking with the correct person using two identifiers. Jacob Sanders is currently located at home and family is currently with them during visit. The provider, Kari Baars, FNP is located in their office at time of visit.  I discussed the limitations, risks, security and privacy concerns of performing an evaluation and management service by virtual visit and the availability of in person appointments. I also discussed with the patient that there may be a patient responsible charge related to this service. The patient expressed understanding and agreed to proceed.  Subjective:  Patient ID: Jacob Sanders, male    DOB: 1945-11-10, 76 y.o.   MRN: [id number]  Chief Complaint:  Covid Positive   HPI: Jacob Sanders is a 76 y.o. male presenting on 08/21/2021 for Covid Positive   Pt reports cough, congestion, fever, chills, and myalgias since yesterday. Did a home COVID test last night and it was positive. He has not tried anything for symptom management.   URI  This is a new problem. The current episode started in the past 7 days. The problem has been waxing and waning. Associated symptoms include congestion, coughing, rhinorrhea and a sore throat. Pertinent negatives include no abdominal pain, chest pain, diarrhea, dysuria, ear pain, headaches, joint pain, joint swelling, nausea, neck pain, plugged ear sensation, rash, sinus pain, sneezing, swollen glands,  vomiting or wheezing. He has tried nothing for the symptoms.     Relevant past medical, surgical, family, and social history reviewed and updated as indicated.  Allergies and medications reviewed and updated.   Past Medical History:  Diagnosis Date   Arthritis    Atrial fibrillation (HCC)    Dysrhythmia    afib   GERD (gastroesophageal reflux disease)    History of kidney stones    passed   Hyperlipidemia    Hypertension    Nerve damage    left hand after accident- 1995   Seasonal allergies    Staph infection    post back surgeries 1998 and1999    Past Surgical History:  Procedure Laterality Date   BACK SURGERY  6063,0160   discectomy   HAND SURGERY Left 1995   OPEN REDUCTION INTERNAL FIXATION (ORIF) DISTAL RADIAL FRACTURE Left 07/29/2019   Procedure: OPEN REDUCTION INTERNAL FIXATION (ORIF) LEFT DISTAL RADIAL FRACTURE;  Surgeon: Dominica Severin, MD;  Location: [facility] OR;  Service: Orthopedics;  Laterality: Left;  90 mins   TONSILLECTOMY     childhood    TOOTH EXTRACTION  3 weeks ago ;   had abcess beneath tooth; experiened some bleeding for 20 hours post op   TOTAL HIP ARTHROPLASTY Left 05/22/2016   Procedure: LEFT TOTAL HIP ARTHROPLASTY ANTERIOR APPROACH;  Surgeon: Samson Frederic, MD;  Location: [facility];  Service: Orthopedics;  Laterality: Left;  Dr. requesting RNFA   TOTAL HIP ARTHROPLASTY Right 07/17/2016   Procedure: RIGHT TOTAL HIP ARTHROPLASTY ANTERIOR APPROACH;  Surgeon: Samson Frederic, MD;  Location: [facility] OR[facility];  Service: Orthopedics;  Laterality: Right;  needs RNFA  Social History   Socioeconomic History   Marital status: Married    Spouse name: Steffanie Dunn   Number of children: 3   Years of education: 10   Highest education level: 10th grade  Occupational History   Occupation: Retired  Tobacco Use   Smoking status: Former    Packs/day: 1.00    Years: 45.00    Total pack years: 45.00    Types: Cigars, Cigarettes    Quit date: 04/13/2021    Years since quitting:  0.3   Smokeless tobacco: Never   Tobacco comments:    4-5 daily  Vaping Use   Vaping Use: Never used  Substance and Sexual Activity   Alcohol use: No   Drug use: No   Sexual activity: Not Currently  Other Topics Concern   Not on file  Social History Narrative   [place] Benefits   Social Determinants of Health   Financial Resource Strain: Low Risk  (06/03/2021)   Overall Financial Resource Strain (CARDIA)    Difficulty of Paying Living Expenses: Not hard at all  Food Insecurity: No Food Insecurity (06/03/2021)   Hunger Vital Sign    Worried About Running Out of Food in the Last Year: Never true    Ran Out of Food in the Last Year: Never true  Transportation Needs: No Transportation Needs (06/03/2021)   PRAPARE - Administrator, Civil Service (Medical): No    Lack of Transportation (Non-Medical): No  Physical Activity: Insufficiently Active (06/03/2021)   Exercise Vital Sign    Days of Exercise per Week: 7 days    Minutes of Exercise per Session: 10 min  Stress: No Stress Concern Present (06/03/2021)   [employer] of Occupational Health - Occupational Stress Questionnaire    Feeling of Stress : Not at all  Social Connections: Socially Integrated (06/03/2021)   Social Connection and Isolation Panel [NHANES]    Frequency of Communication with Friends and Family: More than three times a week    Frequency of Social Gatherings with Friends and Family: Three times a week    Attends Religious Services: More than 4 times per year    Active Member of Clubs or Organizations: Yes    Attends Banker Meetings: More than 4 times per year    Marital Status: Married  Catering manager Violence: Not At Risk (06/03/2021)   Humiliation, Afraid, Rape, and Kick questionnaire    Fear of Current or Ex-Partner: No    Emotionally Abused: No    Physically Abused: No    Sexually Abused: No    Outpatient Encounter Medications as of 08/21/2021  Medication Sig   guaiFENesin  (MUCINEX) 600 MG 12 hr tablet Take 1 tablet (600 mg total) by mouth 2 (two) times daily for 10 days.   molnupiravir EUA (LAGEVRIO) 200 mg CAPS capsule Take 4 capsules (800 mg total) by mouth 2 (two) times daily for 5 days.   apixaban (ELIQUIS) 5 MG TABS tablet Take 1 tablet (5 mg total) by mouth 2 (two) times daily.   atorvastatin (LIPITOR) 20 MG tablet Take 1 tablet (20 mg total) by mouth daily.   busPIRone (BUSPAR) 5 MG tablet TAKE 1 TABLET BY MOUTH TWICE  DAILY   diclofenac Sodium (VOLTAREN) 1 % GEL Apply 2 g topically 4 (four) times daily.   diltiazem (CARDIZEM CD) 300 MG 24 hr capsule TAKE 1 CAPSULE BY MOUTH DAILY   meclizine (ANTIVERT) 25 MG tablet TAKE 1 TABLET BY MOUTH 3 TIMES  DAILY  AS NEEDED FOR DIZZINESS   methylPREDNISolone (MEDROL DOSEPAK) 4 MG TBPK tablet Use as directed   omeprazole (PRILOSEC) 20 MG capsule TAKE 1 CAPSULE BY MOUTH DAILY AS NEEDED   sodium chloride (OCEAN) 0.65 % SOLN nasal spray Place 1 spray into both nostrils as needed for congestion.   No facility-administered encounter medications on file as of 08/21/2021.    Allergies  Allergen Reactions   Morphine    Warfarin And Related Other (See Comments)    Bleeding.   Morphine And Related     Agitation    Prednisone Swelling    Review of Systems  Constitutional:  Positive for activity change, appetite change, chills, fatigue and fever. Negative for diaphoresis and unexpected weight change.  HENT:  Positive for congestion, rhinorrhea and sore throat. Negative for ear pain, sinus pain and sneezing.   Eyes:  Negative for photophobia and visual disturbance.  Respiratory:  Positive for cough. Negative for apnea, choking, chest tightness, shortness of breath, wheezing and stridor.   Cardiovascular:  Negative for chest pain, palpitations and leg swelling.  Gastrointestinal:  Negative for abdominal pain, diarrhea, nausea and vomiting.  Genitourinary:  Negative for decreased urine volume, difficulty urinating and  dysuria.  Musculoskeletal:  Positive for myalgias. Negative for arthralgias, joint pain and neck pain.  Skin:  Negative for rash.  Neurological:  Negative for dizziness, tremors, seizures, syncope, facial asymmetry, speech difficulty, weakness, light-headedness, numbness and headaches.  Psychiatric/Behavioral:  Negative for confusion.   All other systems reviewed and are negative.        Observations/Objective: No vital signs or physical exam, this was a virtual health encounter.  Pt alert and oriented, answers all questions appropriately, and able to speak in full sentences.    Assessment and Plan: Eulises was seen today for covid positive.  Diagnoses and all orders for this visit:  Positive self-administered antigen test for COVID-19 Due to age and comorbidities, will initiate antiviral therapy. Mucinex as prescribed with plenty of water. Pt aware of red flags. Report new, worsening, or persistent symptoms.  -     guaiFENesin (MUCINEX) 600 MG 12 hr tablet; Take 1 tablet (600 mg total) by mouth 2 (two) times daily for 10 days. -     molnupiravir EUA (LAGEVRIO) 200 mg CAPS capsule; Take 4 capsules (800 mg total) by mouth 2 (two) times daily for 5 days.   Follow Up Instructions: Return if symptoms worsen or fail to improve.    I discussed the assessment and treatment plan with the patient. The patient was provided an opportunity to ask questions and all were answered. The patient agreed with the plan and demonstrated an understanding of the instructions.   The patient was advised to call back or seek an in-person evaluation if the symptoms worsen or if the condition fails to improve as anticipated.  The above assessment and management plan was discussed with the patient. The patient verbalized understanding of and has agreed to the management plan. Patient is aware to call the clinic if they develop any new symptoms or if symptoms persist or worsen. Patient is aware when to return  to the clinic for a follow-up visit. Patient educated on when it is appropriate to go to the emergency department.    I provided 15 minutes of time during this telephone encounter.   Kari Baars, FNP-C [facility] [address] [place], [place] [address] [phone] 08/21/2021

## 2021-08-21 NOTE — Telephone Encounter (Signed)
Patient had televisit with [facility] today

## 2021-08-21 NOTE — Telephone Encounter (Signed)
Patient would need antiviral therapy not antibiotic for a Covid infection. Please have him schedule an appt to discuss if he meets the criteria for antiviral treatment.

## 2021-08-27 ENCOUNTER — Ambulatory Visit (INDEPENDENT_AMBULATORY_CARE_PROVIDER_SITE_OTHER): Payer: Medicare Other | Admitting: Family

## 2021-08-27 ENCOUNTER — Ambulatory Visit (INDEPENDENT_AMBULATORY_CARE_PROVIDER_SITE_OTHER): Payer: Medicare Other

## 2021-08-27 ENCOUNTER — Encounter: Payer: Self-pay | Admitting: Family

## 2021-08-27 VITALS — BP 98/72 | HR 75 | Temp 97.6°F | Ht 70.0 in | Wt 189.2 lb

## 2021-08-27 DIAGNOSIS — R531 Weakness: Secondary | ICD-10-CM

## 2021-08-27 DIAGNOSIS — R822 Biliuria: Secondary | ICD-10-CM

## 2021-08-27 DIAGNOSIS — J441 Chronic obstructive pulmonary disease with (acute) exacerbation: Secondary | ICD-10-CM

## 2021-08-27 DIAGNOSIS — Z8616 Personal history of COVID-19: Secondary | ICD-10-CM

## 2021-08-27 DIAGNOSIS — R310 Gross hematuria: Secondary | ICD-10-CM | POA: Diagnosis not present

## 2021-08-27 DIAGNOSIS — I959 Hypotension, unspecified: Secondary | ICD-10-CM

## 2021-08-27 DIAGNOSIS — M25552 Pain in left hip: Secondary | ICD-10-CM | POA: Diagnosis not present

## 2021-08-27 DIAGNOSIS — R059 Cough, unspecified: Secondary | ICD-10-CM | POA: Diagnosis not present

## 2021-08-27 DIAGNOSIS — M25551 Pain in right hip: Secondary | ICD-10-CM | POA: Diagnosis not present

## 2021-08-27 LAB — CBC WITH DIFFERENTIAL/PLATELET
Basophils Absolute: 0.1 10*3/uL (ref 0.0–0.2)
Basos: 1 %
EOS (ABSOLUTE): 0.1 10*3/uL (ref 0.0–0.4)
Eos: 1 %
Hematocrit: 53.2 % — ABNORMAL HIGH (ref 37.5–51.0)
Hemoglobin: 17.8 g/dL — ABNORMAL HIGH (ref 13.0–17.7)
Immature Grans (Abs): 0.1 10*3/uL (ref 0.0–0.1)
Immature Granulocytes: 1 %
Lymphocytes Absolute: 2 10*3/uL (ref 0.7–3.1)
Lymphs: 20 %
MCH: 31.5 pg (ref 26.6–33.0)
MCHC: 33.5 g/dL (ref 31.5–35.7)
MCV: 94 fL (ref 79–97)
Monocytes Absolute: 0.5 10*3/uL (ref 0.1–0.9)
Monocytes: 6 %
Neutrophils Absolute: 7 10*3/uL (ref 1.4–7.0)
Neutrophils: 71 %
Platelets: 231 10*3/uL (ref 150–450)
RBC: 5.65 x10E6/uL (ref 4.14–5.80)
RDW: 13.3 % (ref 11.6–15.4)
WBC: 9.7 10*3/uL (ref 3.4–10.8)

## 2021-08-27 LAB — URINALYSIS
Bilirubin, UA: NEGATIVE
Leukocytes,UA: NEGATIVE
Nitrite, UA: NEGATIVE
Specific Gravity, UA: >1.03 — ABNORMAL HIGH (ref 1.005–1.030)
Urobilinogen, Ur: 2 mg/dL — ABNORMAL HIGH (ref 0.2–1.0)
pH, UA: 5 (ref 5.0–7.5)

## 2021-08-27 LAB — CMP14+EGFR
ALT: 14 IU/L (ref 0–44)
AST: 17 IU/L (ref 0–40)
Albumin/Globulin Ratio: 1.7 (ref 1.2–2.2)
Albumin: 4.3 g/dL (ref 3.8–4.8)
Alkaline Phosphatase: 65 IU/L (ref 44–121)
BUN/Creatinine Ratio: 10 (ref 10–24)
BUN: 12 mg/dL (ref 8–27)
Bilirubin Total: 1.2 mg/dL (ref 0.0–1.2)
CO2: 22 mmol/L (ref 20–29)
Calcium: 9.3 mg/dL (ref 8.6–10.2)
Chloride: 103 mmol/L (ref 96–106)
Creatinine, Ser: 1.18 mg/dL (ref 0.76–1.27)
Globulin, Total: 2.5 g/dL (ref 1.5–4.5)
Glucose: 131 mg/dL — ABNORMAL HIGH (ref 70–99)
Potassium: 4.6 mmol/L (ref 3.5–5.2)
Sodium: 144 mmol/L (ref 134–144)
Total Protein: 6.8 g/dL (ref 6.0–8.5)
eGFR: 65 mL/min/{1.73_m2} (ref >59)

## 2021-08-27 MED ORDER — DOXYCYCLINE HYCLATE 100 MG PO TABS: 100.0000 mg | ORAL_TABLET | Freq: Two times a day (BID) | ORAL | 0 refills | Status: DC

## 2021-08-27 MED ORDER — DILTIAZEM HCL ER 180 MG PO TB24: 180.0000 mg | ORAL_TABLET | Freq: Every day | ORAL | 1 refills | Status: DC

## 2021-08-27 NOTE — Progress Notes (Signed)
Subjective:    Patient ID: Jacob Sanders, male    DOB: 07/01/1945, 76 y.o.   MRN: [id number]  Chief Complaint  Patient presents with   Cough   Fatigue   Shortness of Breath   pain in thighs   Hip Pain   Hematuria   PT presents to the office today with cough that started several weeks. He had COVID last week.  Cough This is a recurrent problem. The current episode started 1 to 4 weeks ago. The problem has been gradually worsening. The problem occurs every few minutes. The cough is Non-productive. Associated symptoms include myalgias, nasal congestion, shortness of breath and wheezing. Pertinent negatives include no chills (resolved), ear congestion, ear pain, fever (resolved), headaches or sore throat. He has tried rest and OTC cough suppressant for the symptoms. The treatment provided mild relief.  Shortness of Breath Associated symptoms include wheezing. Pertinent negatives include no ear pain, fever (resolved), headaches or sore throat.  Hip Pain  The incident occurred more than 1 week ago. There was no injury mechanism. The pain is present in the left hip and right hip. The pain is at a severity of 5/10. The pain is moderate. The pain has been Intermittent since onset. Associated symptoms include muscle weakness. He reports no foreign bodies present. The symptoms are aggravated by weight bearing and palpation. The treatment provided mild relief.  Hematuria This is a chronic problem. The current episode started more than 1 month ago. The problem has been gradually worsening since onset. His pain is at a severity of 0/10. He is experiencing no pain. Associated symptoms include hesitancy. Pertinent negatives include no chills (resolved) or fever (resolved).      Review of Systems  Constitutional:  Negative for chills (resolved) and fever (resolved).  HENT:  Negative for ear pain and sore throat.   Respiratory:  Positive for cough, shortness of breath and wheezing.   Genitourinary:   Positive for hematuria and hesitancy.  Musculoskeletal:  Positive for myalgias.  Neurological:  Negative for headaches.  All other systems reviewed and are negative.  Family History  Problem Relation Age of Onset   Hypertension Mother    Aneurysm Father    Hypertension Father    Social History   Socioeconomic History   Marital status: Married    Spouse name: Anne Ng   Number of children: 3   Years of education: 10   Highest education level: 10th grade  Occupational History   Occupation: Retired  Tobacco Use   Smoking status: Former    Packs/day: 1.00    Years: 45.00    Total pack years: 45.00    Types: Cigars, Cigarettes    Quit date: 04/13/2021    Years since quitting: 0.3   Smokeless tobacco: Never   Tobacco comments:    4-5 daily  Vaping Use   Vaping Use: Never used  Substance and Sexual Activity   Alcohol use: No   Drug use: No   Sexual activity: Not Currently  Other Topics Concern   Not on file  Social History Narrative   [place] Benefits   Social Determinants of Health   Financial Resource Strain: Low Risk  (06/03/2021)   Overall Financial Resource Strain (CARDIA)    Difficulty of Paying Living Expenses: Not hard at all  Food Insecurity: No Food Insecurity (06/03/2021)   Hunger Vital Sign    Worried About Running Out of Food in the Last Year: Never true    Ran Out of Food  in the Last Year: Never true  Transportation Needs: No Transportation Needs (06/03/2021)   PRAPARE - Hydrologist (Medical): No    Lack of Transportation (Non-Medical): No  Physical Activity: Insufficiently Active (06/03/2021)   Exercise Vital Sign    Days of Exercise per Week: 7 days    Minutes of Exercise per Session: 10 min  Stress: No Stress Concern Present (06/03/2021)   [place]    Feeling of Stress : Not at all  Social Connections: [place] (06/03/2021)   Social Connection and  Isolation Panel [NHANES]    Frequency of Communication with Friends and Family: More than three times a week    Frequency of Social Gatherings with Friends and Family: Three times a week    Attends Religious Services: More than 4 times per year    Active Member of Clubs or Organizations: Yes    Attends Archivist Meetings: More than 4 times per year    Marital Status: Married       Objective:   Physical Exam Vitals reviewed.  Constitutional:      General: He is not in acute distress.    Appearance: He is well-developed.  HENT:     Head: Normocephalic.     Right Ear: External ear normal.     Left Ear: External ear normal.  Eyes:     General:        Right eye: No discharge.        Left eye: No discharge.     Pupils: Pupils are equal, round, and reactive to light.  Neck:     Thyroid: No thyromegaly.  Cardiovascular:     Rate and Rhythm: Normal rate and regular rhythm.     Heart sounds: Normal heart sounds. No murmur heard. Pulmonary:     Effort: Pulmonary effort is normal. No respiratory distress.     Breath sounds: Normal breath sounds. No wheezing.  Abdominal:     General: Bowel sounds are normal. There is no distension.     Palpations: Abdomen is soft.     Tenderness: There is no abdominal tenderness.  Musculoskeletal:        General: No tenderness. Normal range of motion.     Cervical back: Normal range of motion and neck supple.  Skin:    General: Skin is warm and dry.     Coloration: Skin is pale.     Findings: No erythema or rash.  Neurological:     Mental Status: He is alert and oriented to person, place, and time.     Cranial Nerves: No cranial nerve deficit.     Deep Tendon Reflexes: Reflexes are normal and symmetric.  Psychiatric:        Behavior: Behavior normal.        Thought Content: Thought content normal.        Judgment: Judgment normal.     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 COPD exacerbation (HCC) Start Doxycycline  - CBC with  Differential/Platelet - CMP14+EGFR - DG Chest 2 View - doxycycline (VIBRA-TABS) 100 MG tablet; Take 1 tablet (100 mg total) by mouth 2 (two) times daily.  Dispense: 20 tablet; Refill: 0  2. Bilateral hip pain  - CBC with Differential/Platelet - CMP14+EGFR  3. Gross hematuria - Urinalysis, Complete - Urinalysis - CBC with Differential/Platelet - CMP14+EGFR  4. History of COVID-19 - CBC with Differential/Platelet - CMP14+EGFR - DG Chest 2 View  5. Bilirubinuria Liver panel added - Hepatic function panel  6. Hypotension, unspecified hypotension type Hold Cardizem today Will decrease Cardizem to 180 mg from 240 mg when BP is >110/60  7. Weakness   Labs pending Approx 45 mins spent with patient, educating, and chart review.  Health Maintenance reviewed Diet and exercise encouraged  Follow up plan: 1 week    Evelina Dun, FNP

## 2021-08-27 NOTE — Patient Instructions (Signed)

## 2021-08-29 LAB — HEPATIC FUNCTION PANEL
ALT: 13 IU/L (ref 0–44)
AST: 14 IU/L (ref 0–40)
Albumin: 4.2 g/dL (ref 3.8–4.8)
Alkaline Phosphatase: 68 IU/L (ref 44–121)
Bilirubin Total: 0.9 mg/dL (ref 0.0–1.2)
Bilirubin, Direct: 0.29 mg/dL (ref 0.00–0.40)
Total Protein: 6.8 g/dL (ref 6.0–8.5)

## 2021-08-29 LAB — SPECIMEN STATUS REPORT

## 2021-09-03 ENCOUNTER — Ambulatory Visit (INDEPENDENT_AMBULATORY_CARE_PROVIDER_SITE_OTHER): Payer: Medicare Other | Admitting: Family

## 2021-09-03 ENCOUNTER — Encounter: Payer: Self-pay | Admitting: Family

## 2021-09-03 VITALS — BP 128/91 | HR 102 | Temp 98.2°F | Ht 70.0 in | Wt 185.0 lb

## 2021-09-03 DIAGNOSIS — N3943 Post-void dribbling: Secondary | ICD-10-CM

## 2021-09-03 DIAGNOSIS — N401 Enlarged prostate with lower urinary tract symptoms: Secondary | ICD-10-CM

## 2021-09-03 DIAGNOSIS — R5383 Other fatigue: Secondary | ICD-10-CM

## 2021-09-03 DIAGNOSIS — I4891 Unspecified atrial fibrillation: Secondary | ICD-10-CM

## 2021-09-03 DIAGNOSIS — E559 Vitamin D deficiency, unspecified: Secondary | ICD-10-CM | POA: Diagnosis not present

## 2021-09-03 DIAGNOSIS — I10 Essential (primary) hypertension: Secondary | ICD-10-CM | POA: Diagnosis not present

## 2021-09-03 DIAGNOSIS — R3989 Other symptoms and signs involving the genitourinary system: Secondary | ICD-10-CM

## 2021-09-03 DIAGNOSIS — R822 Biliuria: Secondary | ICD-10-CM | POA: Diagnosis not present

## 2021-09-03 LAB — MICROSCOPIC EXAMINATION: Renal Epithel, UA: NONE SEEN /hpf

## 2021-09-03 LAB — URINALYSIS, COMPLETE
Bilirubin, UA: NEGATIVE
Glucose, UA: NEGATIVE
Leukocytes,UA: NEGATIVE
Nitrite, UA: NEGATIVE
Specific Gravity, UA: >1.03 — ABNORMAL HIGH (ref 1.005–1.030)
Urobilinogen, Ur: 1 mg/dL (ref 0.2–1.0)
pH, UA: 5 (ref 5.0–7.5)

## 2021-09-03 MED ORDER — TAMSULOSIN HCL 0.4 MG PO CAPS: 0.4000 mg | ORAL_CAPSULE | Freq: Every day | ORAL | 3 refills | Status: DC

## 2021-09-03 NOTE — Patient Instructions (Signed)

## 2021-09-03 NOTE — Progress Notes (Signed)
Subjective:    Patient ID: Jacob Sanders, male    DOB: July 05, 1945, 76 y.o.   MRN: [id number]  Chief Complaint  Patient presents with   Follow-up   Pt presents to the office today for follow up. He continues to have "rust urine".  Worried about this and complaining of dripping.   He complaining of increase weakness and fatigue.   He was given doxycycline for COPD exacerbation. He is still coughing.  He was also hypotensive and we decreased his Cardizem to 180 mg from 240 mg.  Benign Prostatic Hypertrophy This is a new problem. The current episode started more than 1 year ago. Irritative symptoms include urgency. Irritative symptoms do not include frequency or nocturia. Obstructive symptoms include dribbling. Pertinent negatives include no nausea or vomiting.  Hypertension This is a chronic problem. The current episode started more than 1 year ago. The problem has been resolved since onset. The problem is controlled. Associated symptoms include malaise/fatigue. Pertinent negatives include no headaches, peripheral edema or shortness of breath. Risk factors for coronary artery disease include dyslipidemia, obesity and male gender. Past treatments include calcium channel blockers. The current treatment provides moderate improvement.  Cough This is a recurrent problem. The current episode started more than 1 month ago. The problem has been waxing and waning. The problem occurs every few minutes. The cough is Productive of sputum. Associated symptoms include ear pain (right, improving), nasal congestion and wheezing. Pertinent negatives include no ear congestion, fever, headaches or shortness of breath. He has tried rest for the symptoms. The treatment provided mild relief.      Review of Systems  Constitutional:  Positive for fatigue and malaise/fatigue. Negative for fever.  HENT:  Positive for ear pain (right, improving).   Respiratory:  Positive for cough and wheezing. Negative for  shortness of breath.   Gastrointestinal:  Negative for nausea and vomiting.  Genitourinary:  Positive for urgency. Negative for frequency and nocturia.  Neurological:  Negative for headaches.  All other systems reviewed and are negative.      Objective:   Physical Exam Vitals reviewed.  Constitutional:      General: He is not in acute distress.    Appearance: He is well-developed.     Comments: Looks better than last week  HENT:     Head: Normocephalic.     Right Ear: External ear normal.     Left Ear: External ear normal.  Eyes:     General:        Right eye: No discharge.        Left eye: No discharge.     Pupils: Pupils are equal, round, and reactive to light.  Neck:     Thyroid: No thyromegaly.  Cardiovascular:     Rate and Rhythm: Normal rate and regular rhythm.     Heart sounds: Normal heart sounds. No murmur heard. Pulmonary:     Effort: Pulmonary effort is normal. No respiratory distress.     Breath sounds: Normal breath sounds. No wheezing.     Comments: diminished Abdominal:     General: Bowel sounds are normal. There is no distension.     Palpations: Abdomen is soft.     Tenderness: There is no abdominal tenderness.  Musculoskeletal:        General: No tenderness. Normal range of motion.     Cervical back: Normal range of motion and neck supple.  Skin:    General: Skin is warm and dry.  Findings: No erythema or rash.  Neurological:     Mental Status: He is alert and oriented to person, place, and time.     Cranial Nerves: No cranial nerve deficit.     Deep Tendon Reflexes: Reflexes are normal and symmetric.  Psychiatric:        Behavior: Behavior normal.        Thought Content: Thought content normal.        Judgment: Judgment normal.        BP (!) 128/91   Pulse (!) 102   Temp 98.2 F (36.8 C) (Temporal)   Ht 5' 10"  (1.778 m)   Wt 185 lb (83.9 kg)   SpO2 97%   BMI 26.54 kg/m   Assessment & Plan:  TERESA LEMMERMAN comes in today with  chief complaint of Follow-up   Diagnosis and orders addressed:  1. Urine discoloration - Urinalysis, Complete - BMP8+EGFR  2. Primary hypertension - BMP8+EGFR  3. Atrial fibrillation, unspecified type (HCC) - BMP8+EGFR  4. Benign prostatic hyperplasia with post-void dribbling Start flomax  Referral to Urologists pending  - tamsulosin (FLOMAX) 0.4 MG CAPS capsule; Take 1 capsule (0.4 mg total) by mouth daily.  Dispense: 30 capsule; Refill: 3 - BMP8+EGFR - Ambulatory referral to Urology  5. Other fatigue - BMP8+EGFR - Iron, TIBC and Ferritin Panel - Vitamin B1  6. Vitamin D deficiency - BMP8+EGFR - VITAMIN D 25 Hydroxy (Vit-D Deficiency, Fractures)   Labs pending Health Maintenance reviewed Diet and exercise encouraged  Follow up plan: 3 months    Evelina Dun, FNP

## 2021-09-04 DIAGNOSIS — H43812 Vitreous degeneration, left eye: Secondary | ICD-10-CM | POA: Diagnosis not present

## 2021-09-04 DIAGNOSIS — H353132 Nonexudative age-related macular degeneration, bilateral, intermediate dry stage: Secondary | ICD-10-CM | POA: Diagnosis not present

## 2021-09-05 ENCOUNTER — Other Ambulatory Visit: Payer: Self-pay | Admitting: Family

## 2021-09-05 MED ORDER — VITAMIN D (ERGOCALCIFEROL) 1.25 MG (50000 UNIT) PO CAPS: 50000.0000 [IU] | ORAL_CAPSULE | ORAL | 3 refills | Status: DC

## 2021-09-06 LAB — BMP8+EGFR
BUN/Creatinine Ratio: 14 (ref 10–24)
BUN: 15 mg/dL (ref 8–27)
CO2: 22 mmol/L (ref 20–29)
Calcium: 9.5 mg/dL (ref 8.6–10.2)
Chloride: 102 mmol/L (ref 96–106)
Creatinine, Ser: 1.09 mg/dL (ref 0.76–1.27)
Glucose: 116 mg/dL — ABNORMAL HIGH (ref 70–99)
Potassium: 5.1 mmol/L (ref 3.5–5.2)
Sodium: 142 mmol/L (ref 134–144)
eGFR: 71 mL/min/{1.73_m2} (ref >59)

## 2021-09-06 LAB — IRON,TIBC AND FERRITIN PANEL
Ferritin: 75 ng/mL (ref 30–400)
Iron Saturation: 35 % (ref 15–55)
Iron: 102 ug/dL (ref 38–169)
Total Iron Binding Capacity: 288 ug/dL (ref 250–450)
UIBC: 186 ug/dL (ref 111–343)

## 2021-09-06 LAB — VITAMIN D 25 HYDROXY (VIT D DEFICIENCY, FRACTURES): Vit D, 25-Hydroxy: 24 ng/mL — ABNORMAL LOW (ref 30.0–100.0)

## 2021-09-06 LAB — VITAMIN B1: Thiamine: 121.6 nmol/L (ref 66.5–200.0)

## 2021-09-10 ENCOUNTER — Ambulatory Visit: Payer: Medicare Other | Attending: Cardiovascular Disease | Admitting: Cardiovascular Disease

## 2021-09-10 ENCOUNTER — Encounter: Payer: Self-pay | Admitting: Cardiovascular Disease

## 2021-09-10 VITALS — BP 124/88 | HR 89 | Ht 70.0 in | Wt 196.0 lb

## 2021-09-10 DIAGNOSIS — I482 Chronic atrial fibrillation, unspecified: Secondary | ICD-10-CM | POA: Diagnosis not present

## 2021-09-10 DIAGNOSIS — I10 Essential (primary) hypertension: Secondary | ICD-10-CM | POA: Diagnosis not present

## 2021-09-10 DIAGNOSIS — E785 Hyperlipidemia, unspecified: Secondary | ICD-10-CM

## 2021-09-10 NOTE — Progress Notes (Signed)
Cardiology Office Note   Date:  09/10/2021   ID:  Jacob Sanders, DOB [birth year], MRN [id number]  PCP:  Junie Spencer, FNP  Cardiologist:   Lorine Bears, MD   Chief Complaint  Patient presents with   Follow-up    6 months.   Headache   Shortness of Breath      History of Present Illness: Jacob Sanders is a 76 y.o. male who is here today for follow-up visit regarding chronic atrial fibrillation. He has known history of chronic atrial fibrillation, essential hypertension and prolonged history of tobacco use. He had ABI done in 2017 for leg pain which was normal with no evidence of PAD.  He had nuclear stress testing in 2018 for exertional dyspnea and abnormal EKG that was normal. The patient had previous bilateral hip replacement.  Anticoagulation with warfarin in the past was associated with hematuria.    He had atypical chest pain in 2022 and thus underwent cardiac work-up that included a Lexiscan Myoview that showed no evidence of ischemia. Echocardiogram in July 2022 showed normal LV systolic function with severe biatrial enlargement, mild mitral regurgitation and mildly dilated ascending aorta at 37 mm.  He had a stroke in December 2022 but fortunately with no residual neurologic symptoms. During last visit, I started him on anticoagulation with Eliquis. He was able to quit smoking 4 months ago shortly after he was diagnosed with COPD.  He has been doing reasonably well with no chest pain.  He reports chronic exertional dyspnea and fatigue.  No dizziness or syncope.  No side effects with anticoagulation.    Past Medical History:  Diagnosis Date   Arthritis    Atrial fibrillation (HCC)    Dysrhythmia    afib   GERD (gastroesophageal reflux disease)    History of kidney stones    passed   Hyperlipidemia    Hypertension    Nerve damage    left hand after accident- 1995   Seasonal allergies    Staph infection    post back surgeries 1998 and1999     Past Surgical History:  Procedure Laterality Date   BACK SURGERY  2409,7353   discectomy   HAND SURGERY Left 1995   OPEN REDUCTION INTERNAL FIXATION (ORIF) DISTAL RADIAL FRACTURE Left 07/29/2019   Procedure: OPEN REDUCTION INTERNAL FIXATION (ORIF) LEFT DISTAL RADIAL FRACTURE;  Surgeon: Dominica Severin, MD;  Location: [facility] OR;  Service: Orthopedics;  Laterality: Left;  90 mins   TONSILLECTOMY     childhood    TOOTH EXTRACTION  3 weeks ago ;   had abcess beneath tooth; experiened some bleeding for 20 hours post op   TOTAL HIP ARTHROPLASTY Left 05/22/2016   Procedure: LEFT TOTAL HIP ARTHROPLASTY ANTERIOR APPROACH;  Surgeon: Samson Frederic, MD;  Location: [facility] OR[facility];  Service: Orthopedics;  Laterality: Left;  Dr. requesting RNFA   TOTAL HIP ARTHROPLASTY Right 07/17/2016   Procedure: RIGHT TOTAL HIP ARTHROPLASTY ANTERIOR APPROACH;  Surgeon: Samson Frederic, MD;  Location: [facility] ORS;  Service: Orthopedics;  Laterality: Right;  needs RNFA     Current Outpatient Medications  Medication Sig Dispense Refill   apixaban (ELIQUIS) 5 MG TABS tablet Take 1 tablet (5 mg total) by mouth 2 (two) times daily. 180 tablet 3   busPIRone (BUSPAR) 5 MG tablet TAKE 1 TABLET BY MOUTH TWICE  DAILY 180 tablet 0   diclofenac Sodium (VOLTAREN) 1 % GEL Apply 2 g topically 4 (four) times daily. 150 g 0   diltiazem (  CARDIZEM LA) 180 MG 24 hr tablet Take 1 tablet (180 mg total) by mouth daily. 90 tablet 1   doxycycline (VIBRA-TABS) 100 MG tablet Take 1 tablet (100 mg total) by mouth 2 (two) times daily. 20 tablet 0   meclizine (ANTIVERT) 25 MG tablet TAKE 1 TABLET BY MOUTH 3 TIMES  DAILY AS NEEDED FOR DIZZINESS 180 tablet 0   omeprazole (PRILOSEC) 20 MG capsule TAKE 1 CAPSULE BY MOUTH DAILY AS NEEDED 100 capsule 0   sodium chloride (OCEAN) 0.65 % SOLN nasal spray Place 1 spray into both nostrils as needed for congestion. 60 mL 0   tamsulosin (FLOMAX) 0.4 MG CAPS capsule Take 1 capsule (0.4 mg total) by mouth daily. 30 capsule 3    Vitamin D, Ergocalciferol, (DRISDOL) 1.25 MG (50000 UNIT) CAPS capsule Take 1 capsule (50,000 Units total) by mouth every 7 (seven) days. 12 capsule 3   atorvastatin (LIPITOR) 20 MG tablet Take 1 tablet (20 mg total) by mouth daily. 90 tablet 3   No current facility-administered medications for this visit.    Allergies:   Morphine, Warfarin and related, Morphine and related, and Prednisone    Social History:  The patient  reports that he quit smoking about 4 months ago. His smoking use included cigars and cigarettes. He has a 45.00 pack-year smoking history. He has never used smokeless tobacco. He reports that he does not drink alcohol and does not use drugs.   Family History:  The patient's family history includes Aneurysm in his father; Hypertension in his father and mother.    ROS:  Please see the history of present illness.   Otherwise, review of systems are positive for none.   All other systems are reviewed and negative.    PHYSICAL EXAM: VS:  BP 124/88 (BP Location: Right Arm, Patient Position: Sitting, Cuff Size: Normal)   Pulse 89   Ht 5\' 10"  (1.778 m)   Wt 196 lb (88.9 kg)   BMI 28.12 kg/m  , BMI Body mass index is 28.12 kg/m. GEN: Well nourished, well developed, in no acute distress  HEENT: normal  Neck: no JVD, carotid bruits, or masses Cardiac: ; Irregularly irregular no murmurs, rubs, or gallops,no edema  Respiratory:  clear to auscultation bilaterally, normal work of breathing GI: soft, nontender, nondistended, + BS MS: no deformity or atrophy  Skin: warm and dry, no rash Neuro:  Strength and sensation are intact Psych: euthymic mood, full affect  EKG:  EKG is ordered today. EKG showed atrial fibrillation with ventricular rate of 89 bpm, right bundle branch block and inferolateral T wave changes.   Recent Labs: 01/10/2021: TSH 2.230 08/27/2021: ALT 14; ALT 13; Hemoglobin 17.8; Platelets 231 09/03/2021: BUN 15; Creatinine, Ser 1.09; Potassium 5.1; Sodium 142     Lipid Panel    Component Value Date/Time   CHOL 138 04/15/2021 1123   TRIG 61 04/15/2021 1123   HDL 59 04/15/2021 1123   CHOLHDL 2.3 04/15/2021 1123   LDLCALC 66 04/15/2021 1123      Wt Readings from Last 3 Encounters:  09/10/21 196 lb (88.9 kg)  09/03/21 185 lb (83.9 kg)  08/27/21 189 lb 3.2 oz (85.8 kg)         12/18/2015    9:26 AM  PAD Screen  Previous PAD dx? No  Previous surgical procedure? No  Pain with walking? Yes  Subsides with rest? No  Feet/toe relief with dangling? No  Painful, non-healing ulcers? No  Extremities discolored? No  ASSESSMENT AND PLAN:   1. Chronic atrial fibrillation: Ventricular rate is controlled on diltiazem.  He is tolerating anticoagulation with Eliquis 5 mg but has been taking the medication once daily and not twice daily.  I instructed him to start taking twice daily.   His CHA2DS2-VASc score is 4.    2.  Exertional dyspnea: Likely due to underlying lung disease.  Echocardiogram last year was unremarkable and Lexiscan Myoview showed no evidence of ischemia.  3. Essential hypertension: Blood pressure is well controlled today.  4. Tobacco use: I congratulated him on smoking cessation.  5.  Hyperlipidemia: He was switched to atorvastatin during last visit.  A follow-up lipid profile showed an LDL of 66 which is at target.  I discussed with him the importance of regular exercise.   Disposition:   FU with me in 6 months.   Signed,  Lorine Bears, MD  09/10/2021 11:47 AM    [facility]

## 2021-09-10 NOTE — Patient Instructions (Signed)
Medication Instructions:  Be sure to take Eliquis twice daily   *If you need a refill on your cardiac medications before your next appointment, please call your pharmacy*   Follow-Up: At [facility], you and your health needs are our priority.  As part of our continuing mission to provide you with exceptional heart care, we have created designated Provider Care Teams.  These Care Teams include your primary Cardiologist (physician) and Advanced Practice Providers (APPs -  Physician Assistants and Nurse Practitioners) who all work together to provide you with the care you need, when you need it.  We recommend signing up for the patient portal called "MyChart".  Sign up information is provided on this After Visit Summary.  MyChart is used to connect with patients for Virtual Visits (Telemedicine).  Patients are able to view lab/test results, encounter notes, upcoming appointments, etc.  Non-urgent messages can be sent to your provider as well.   To learn more about what you can do with MyChart, go to [email or web address].    Your next appointment:   6 month(s)  The format for your next appointment:   In Person  Provider:   Lorine Bears, MD

## 2021-09-19 ENCOUNTER — Telehealth: Payer: Self-pay

## 2021-09-19 ENCOUNTER — Ambulatory Visit: Payer: Medicare Other | Admitting: Urology

## 2021-09-19 VITALS — BP 100/67 | HR 76

## 2021-09-19 DIAGNOSIS — R3912 Poor urinary stream: Secondary | ICD-10-CM | POA: Diagnosis not present

## 2021-09-19 DIAGNOSIS — N3943 Post-void dribbling: Secondary | ICD-10-CM

## 2021-09-19 DIAGNOSIS — N401 Enlarged prostate with lower urinary tract symptoms: Secondary | ICD-10-CM | POA: Diagnosis not present

## 2021-09-19 DIAGNOSIS — R3915 Urgency of urination: Secondary | ICD-10-CM

## 2021-09-19 LAB — URINALYSIS, ROUTINE W REFLEX MICROSCOPIC
Bilirubin, UA: NEGATIVE
Glucose, UA: NEGATIVE
Leukocytes,UA: NEGATIVE
Nitrite, UA: NEGATIVE
Specific Gravity, UA: >1.03 — ABNORMAL HIGH (ref 1.005–1.030)
Urobilinogen, Ur: 1 mg/dL (ref 0.2–1.0)
pH, UA: 5 (ref 5.0–7.5)

## 2021-09-19 LAB — BLADDER SCAN AMB NON-IMAGING: Scan Result: 0

## 2021-09-19 LAB — MICROSCOPIC EXAMINATION
Bacteria, UA: NONE SEEN
Renal Epithel, UA: NONE SEEN /hpf
WBC, UA: NONE SEEN /hpf (ref 0–5)

## 2021-09-19 MED ORDER — SILODOSIN 4 MG PO CAPS: 4.0000 mg | ORAL_CAPSULE | Freq: Every day | ORAL | 11 refills | Status: DC

## 2021-09-19 NOTE — Progress Notes (Signed)
post void residual=0 ?

## 2021-09-19 NOTE — Telephone Encounter (Signed)
Patient left a voice message.  Needing a generic Rx to be called in.  Cost is too high.  Thanks, Rosey Bath

## 2021-09-19 NOTE — Progress Notes (Signed)
Subjective: 1. Benign prostatic hyperplasia with post-void dribbling   2. Weak urinary stream   3. Urgency of urination      Consult requested by Pricilla Handler NP.  Jacob Sanders is a 76 yo male who is sent for BPH with BOO and post void dribbling.  He was placed on tamsulosin about 2 weeks ago and is doing better with decreased dribbling.   His IPSS is 12 with urgency, nocturia x 2, a reduced stream and incomplete emptying.  He has had prior UTI's.  He has passed one stone.  He has had no GU surgery.  He had some bloody urine on warfarin 4 years ago but that cleared.  He was never evaluated.   He is on Eliquis currently for Afib.  His UA's have had trace blood but no RBC's on micro.   He was given doxycycline on 08/27/21 when he had COVID and a URI.  His PSA was 1.0 with a 21% f/t ratio on 01/10/21.  He has normal renal function.  He had a CVA in 12/22 and has some sensory changes on the left side.  He has some dizziness since his stroke but it is worse since starting the tamsulosin and his BP is borderline low today.  ROS:  Review of Systems  Constitutional:  Positive for malaise/fatigue.  HENT:  Positive for congestion.   Respiratory:  Positive for shortness of breath.   Musculoskeletal:  Positive for back pain and joint pain.  Neurological:  Positive for dizziness, sensory change and weakness.  Psychiatric/Behavioral:  Positive for depression. The patient is nervous/anxious.   All other systems reviewed and are negative.   Allergies  Allergen Reactions   Morphine    Warfarin And Related Other (See Comments)    Bleeding.   Morphine And Related     Agitation    Prednisone Swelling    Past Medical History:  Diagnosis Date   Arthritis    Atrial fibrillation (HCC)    Dysrhythmia    afib   GERD (gastroesophageal reflux disease)    History of kidney stones    passed   Hyperlipidemia    Hypertension    Nerve damage    left hand after accident- 1995   Seasonal allergies    Staph  infection    post back surgeries 1998 and1999    Past Surgical History:  Procedure Laterality Date   BACK SURGERY  2778,2423   discectomy   HAND SURGERY Left 1995   OPEN REDUCTION INTERNAL FIXATION (ORIF) DISTAL RADIAL FRACTURE Left 07/29/2019   Procedure: OPEN REDUCTION INTERNAL FIXATION (ORIF) LEFT DISTAL RADIAL FRACTURE;  Surgeon: Roseanne Kaufman, MD;  Location: [facility];  Service: Orthopedics;  Laterality: Left;  90 mins   TONSILLECTOMY     childhood    TOOTH EXTRACTION  3 weeks ago ;   had abcess beneath tooth; experiened some bleeding for 20 hours post op   TOTAL HIP ARTHROPLASTY Left 05/22/2016   Procedure: LEFT TOTAL HIP ARTHROPLASTY ANTERIOR APPROACH;  Surgeon: Rod Can, MD;  Location: [facility];  Service: Orthopedics;  Laterality: Left;  Dr. requesting RNFA   TOTAL HIP ARTHROPLASTY Right 07/17/2016   Procedure: RIGHT TOTAL HIP ARTHROPLASTY ANTERIOR APPROACH;  Surgeon: Rod Can, MD;  Location: [facility];  Service: Orthopedics;  Laterality: Right;  needs RNFA    Social History   Socioeconomic History   Marital status: Married    Spouse name: Anne Ng   Number of children: 3   Years of education: 10  Highest education level: 10th grade  Occupational History   Occupation: Retired  Tobacco Use   Smoking status: Former    Packs/day: 1.00    Years: 45.00    Total pack years: 45.00    Types: Cigars, Cigarettes    Quit date: 04/13/2021    Years since quitting: 0.4   Smokeless tobacco: Never   Tobacco comments:    4-5 daily  Vaping Use   Vaping Use: Never used  Substance and Sexual Activity   Alcohol use: No   Drug use: No   Sexual activity: Not Currently  Other Topics Concern   Not on file  Social History Narrative   [place] Benefits   Social Determinants of Health   Financial Resource Strain: Low Risk  (06/03/2021)   Overall Financial Resource Strain (CARDIA)    Difficulty of Paying Living Expenses: Not hard at all  Food Insecurity: No Food Insecurity (06/03/2021)    Hunger Vital Sign    Worried About Running Out of Food in the Last Year: Never true    Ran Out of Food in the Last Year: Never true  Transportation Needs: No Transportation Needs (06/03/2021)   PRAPARE - Hydrologist (Medical): No    Lack of Transportation (Non-Medical): No  Physical Activity: Insufficiently Active (06/03/2021)   Exercise Vital Sign    Days of Exercise per Week: 7 days    Minutes of Exercise per Session: 10 min  Stress: No Stress Concern Present (06/03/2021)   [place]    Feeling of Stress : Not at all  Social Connections: [place] (06/03/2021)   Social Connection and Isolation Panel [NHANES]    Frequency of Communication with Friends and Family: More than three times a week    Frequency of Social Gatherings with Friends and Family: Three times a week    Attends Religious Services: More than 4 times per year    Active Member of Clubs or Organizations: Yes    Attends Archivist Meetings: More than 4 times per year    Marital Status: Married  Human resources officer Violence: Not At Risk (06/03/2021)   Humiliation, Afraid, Rape, and Kick questionnaire    Fear of Current or Ex-Partner: No    Emotionally Abused: No    Physically Abused: No    Sexually Abused: No    Family History  Problem Relation Age of Onset   Hypertension Mother    Aneurysm Father    Hypertension Father     Anti-infectives: Anti-infectives (From admission, onward)    None       Current Outpatient Medications  Medication Sig Dispense Refill   silodosin (RAPAFLO) 4 MG CAPS capsule Take 1 capsule (4 mg total) by mouth daily with breakfast. 30 capsule 11   apixaban (ELIQUIS) 5 MG TABS tablet Take 1 tablet (5 mg total) by mouth 2 (two) times daily. 180 tablet 3   atorvastatin (LIPITOR) 20 MG tablet Take 1 tablet (20 mg total) by mouth daily. 90 tablet 3   busPIRone (BUSPAR) 5 MG  tablet TAKE 1 TABLET BY MOUTH TWICE  DAILY 180 tablet 0   diclofenac Sodium (VOLTAREN) 1 % GEL Apply 2 g topically 4 (four) times daily. 150 g 0   diltiazem (CARDIZEM LA) 180 MG 24 hr tablet Take 1 tablet (180 mg total) by mouth daily. 90 tablet 1   doxycycline (VIBRA-TABS) 100 MG tablet Take 1 tablet (100 mg total) by mouth  2 (two) times daily. 20 tablet 0   meclizine (ANTIVERT) 25 MG tablet TAKE 1 TABLET BY MOUTH 3 TIMES  DAILY AS NEEDED FOR DIZZINESS 180 tablet 0   omeprazole (PRILOSEC) 20 MG capsule TAKE 1 CAPSULE BY MOUTH DAILY AS NEEDED 100 capsule 0   sodium chloride (OCEAN) 0.65 % SOLN nasal spray Place 1 spray into both nostrils as needed for congestion. 60 mL 0   Vitamin D, Ergocalciferol, (DRISDOL) 1.25 MG (50000 UNIT) CAPS capsule Take 1 capsule (50,000 Units total) by mouth every 7 (seven) days. 12 capsule 3   No current facility-administered medications for this visit.     Objective: Vital signs in last 24 hours: BP 100/67   Pulse 76   Intake/Output from previous day: No intake/output data recorded. Intake/Output this shift: _0 @   Physical Exam Vitals reviewed.  Constitutional:      Appearance: Normal appearance.  Cardiovascular:     Rate and Rhythm: Normal rate and regular rhythm.     Pulses: Normal pulses.  Pulmonary:     Effort: Pulmonary effort is normal.     Breath sounds: Normal breath sounds.  Abdominal:     General: Abdomen is flat.     Palpations: Abdomen is soft.     Tenderness: There is no abdominal tenderness.  Genitourinary:    Comments: Circ phallus with adequate meatus. Scrotum, testes and epididymis normal. AP without lesions. NST without mass. Prostate 2+ benign. SV non-palpable.  Musculoskeletal:        General: No swelling. Normal range of motion.  Skin:    General: Skin is warm and dry.  Neurological:     General: No focal deficit present.     Mental Status: He is alert and oriented to person, place, and time.   Psychiatric:        Mood and Affect: Mood normal.        Behavior: Behavior normal.     Lab Results:  Results for orders placed or performed in visit on 09/19/21 (from the past 24 hour(s))  Urinalysis, Routine w reflex microscopic     Status: Abnormal   Collection Time: 09/19/21  2:26 PM  Result Value Ref Range   Specific Gravity, UA >1.030 (H) 1.005 - 1.030   pH, UA 5.0 5.0 - 7.5   Color, UA Amber (A) Yellow   Appearance Ur Clear Clear   Leukocytes,UA Negative Negative   Protein,UA 1+ (A) Negative/Trace   Glucose, UA Negative Negative   Ketones, UA Trace (A) Negative   RBC, UA Trace (A) Negative   Bilirubin, UA Negative Negative   Urobilinogen, Ur 1.0 0.2 - 1.0 mg/dL   Nitrite, UA Negative Negative   Microscopic Examination See below:    Narrative   Performed at:  [facility] [address], [place], [place]  [id number] Lab Director: Mina Marble MT, Phone:  [phone]  Microscopic Examination     Status: None   Collection Time: 09/19/21  2:26 PM   Urine  Result Value Ref Range   WBC, UA None seen 0 - 5 /hpf   RBC, Urine 0-2 0 - 2 /hpf   Epithelial Cells (non renal) 0-10 0 - 10 /hpf   Renal Epithel, UA None seen None seen /hpf   Mucus, UA Present Not Estab.   Bacteria, UA None seen None seen/Few   Narrative   Performed at:  [facility] [address], [place], [place]  [id number] Lab Director: [place], Phone:  [phone]    BMET No results for input(s): "NA", "K", "CL", "CO2", "GLUCOSE", "BUN", "CREATININE", "CALCIUM" in the last 72 hours. PT/INR No results for input(s): "LABPROT", "INR" in the last 72 hours. ABG No results for input(s): "PHART", "HCO3" in the last 72 hours.  Invalid input(s): "PCO2", "PO2" Recent Results (from the past 2160 hour(s))  Urinalysis     Status: Abnormal   Collection Time: 08/27/21 10:30 AM  Result Value Ref Range   Specific Gravity, UA >1.030 (H) 1.005 - 1.030   pH, UA 5.0 5.0 - 7.5    Color, UA Yellow Yellow   Appearance Ur Clear Clear   Leukocytes,UA Negative Negative   Protein,UA 2+ (A) Negative/Trace   Glucose, UA Trace (A) Negative   Ketones, UA Trace (A) Negative   RBC, UA Trace (A) Negative   Bilirubin, UA Negative Negative   Urobilinogen, Ur 2.0 (H) 0.2 - 1.0 mg/dL   Nitrite, UA Negative Negative  CBC with Differential/Platelet     Status: Abnormal   Collection Time: 08/27/21 10:43 AM  Result Value Ref Range   WBC 9.7 3.4 - 10.8 x10E3/uL   RBC 5.65 4.14 - 5.80 x10E6/uL   Hemoglobin 17.8 (H) 13.0 - 17.7 g/dL   Hematocrit 53.2 (H) 37.5 - 51.0 %   MCV 94 79 - 97 fL   MCH 31.5 26.6 - 33.0 pg   MCHC 33.5 31.5 - 35.7 g/dL   RDW 13.3 11.6 - 15.4 %   Platelets 231 150 - 450 x10E3/uL   Neutrophils 71 Not Estab. %   Lymphs 20 Not Estab. %   Monocytes 6 Not Estab. %   Eos 1 Not Estab. %   Basos 1 Not Estab. %   Neutrophils Absolute 7.0 1.4 - 7.0 x10E3/uL   Lymphocytes Absolute 2.0 0.7 - 3.1 x10E3/uL   Monocytes Absolute 0.5 0.1 - 0.9 x10E3/uL   EOS (ABSOLUTE) 0.1 0.0 - 0.4 x10E3/uL   Basophils Absolute 0.1 0.0 - 0.2 x10E3/uL   Immature Granulocytes 1 Not Estab. %   Immature Grans (Abs) 0.1 0.0 - 0.1 x10E3/uL  CMP14+EGFR     Status: Abnormal   Collection Time: 08/27/21 10:43 AM  Result Value Ref Range   Glucose 131 (H) 70 - 99 mg/dL   BUN 12 8 - 27 mg/dL   Creatinine, Ser 1.18 0.76 - 1.27 mg/dL   eGFR 65 >59 mL/min/1.73   BUN/Creatinine Ratio 10 10 - 24   Sodium 144 134 - 144 mmol/L   Potassium 4.6 3.5 - 5.2 mmol/L   Chloride 103 96 - 106 mmol/L   CO2 22 20 - 29 mmol/L   Calcium 9.3 8.6 - 10.2 mg/dL   Total Protein 6.8 6.0 - 8.5 g/dL   Albumin 4.3 3.8 - 4.8 g/dL   Globulin, Total 2.5 1.5 - 4.5 g/dL   Albumin/Globulin Ratio 1.7 1.2 - 2.2   Bilirubin Total 1.2 0.0 - 1.2 mg/dL   Alkaline Phosphatase 65 44 - 121 IU/L   AST 17 0 - 40 IU/L   ALT 14 0 - 44 IU/L  Hepatic function panel     Status: None   Collection Time: 08/27/21 10:43 AM  Result Value  Ref Range   Total Protein 6.8 6.0 - 8.5 g/dL   Albumin 4.2 3.8 - 4.8 g/dL   Bilirubin Total 0.9 0.0 - 1.2 mg/dL   Bilirubin, Direct 0.29 0.00 - 0.40 mg/dL   Alkaline Phosphatase 68 44 - 121 IU/L   AST 14 0 - 40 IU/L  ALT 13 0 - 44 IU/L  Specimen status report     Status: None   Collection Time: 08/27/21 10:43 AM  Result Value Ref Range   specimen status report Comment     Comment: Written Authorization Written Authorization Written Authorization Received. Authorization received from [employer] 08-28-2021 Logged by Leona Carry   Urinalysis, Complete     Status: Abnormal   Collection Time: 09/03/21 11:37 AM  Result Value Ref Range   Specific Gravity, UA >1.030 (H) 1.005 - 1.030   pH, UA 5.0 5.0 - 7.5   Color, UA Yellow Yellow   Appearance Ur Clear Clear   Leukocytes,UA Negative Negative   Protein,UA Trace (A) Negative/Trace   Glucose, UA Negative Negative   Ketones, UA Trace (A) Negative   RBC, UA Trace (A) Negative   Bilirubin, UA Negative Negative   Urobilinogen, Ur 1.0 0.2 - 1.0 mg/dL   Nitrite, UA Negative Negative   Microscopic Examination See below:   Microscopic Examination     Status: Abnormal   Collection Time: 09/03/21 11:37 AM   Urine  Result Value Ref Range   WBC, UA 0-5 0 - 5 /hpf   RBC, Urine 0-2 0 - 2 /hpf   Epithelial Cells (non renal) 0-10 0 - 10 /hpf   Renal Epithel, UA None seen None seen /hpf   Casts Present (A) None seen /lpf   Cast Type Hyaline casts N/A   Mucus, UA Present (A) Not Estab.   Bacteria, UA Few (A) None seen/Few  BMP8+EGFR     Status: Abnormal   Collection Time: 09/03/21 12:11 PM  Result Value Ref Range   Glucose 116 (H) 70 - 99 mg/dL   BUN 15 8 - 27 mg/dL   Creatinine, Ser 1.09 0.76 - 1.27 mg/dL   eGFR 71 >59 mL/min/1.73   BUN/Creatinine Ratio 14 10 - 24   Sodium 142 134 - 144 mmol/L   Potassium 5.1 3.5 - 5.2 mmol/L   Chloride 102 96 - 106 mmol/L   CO2 22 20 - 29 mmol/L   Calcium 9.5 8.6 - 10.2 mg/dL  Iron, TIBC and  Ferritin Panel     Status: None   Collection Time: 09/03/21 12:11 PM  Result Value Ref Range   Total Iron Binding Capacity 288 250 - 450 ug/dL   UIBC 186 111 - 343 ug/dL   Iron 102 38 - 169 ug/dL   Iron Saturation 35 15 - 55 %   Ferritin 75 30 - 400 ng/mL  Vitamin B1     Status: None   Collection Time: 09/03/21 12:11 PM  Result Value Ref Range   Thiamine 121.6 66.5 - 200.0 nmol/L  VITAMIN D 25 Hydroxy (Vit-D Deficiency, Fractures)     Status: Abnormal   Collection Time: 09/03/21 12:11 PM  Result Value Ref Range   Vit D, 25-Hydroxy 24.0 (L) 30.0 - 100.0 ng/mL    Comment: Vitamin D deficiency has been defined by the Institute of Medicine and an Endocrine Society practice guideline as a level of serum 25-OH vitamin D less than 20 ng/mL (1,2). The Endocrine Society went on to further define vitamin D insufficiency as a level between 21 and 29 ng/mL (2). 1. IOM (Institute of Medicine). 2010. Dietary reference    intakes for calcium and D. [place]: The    [employer]. 2. Holick MF, Binkley Emily, Bischoff-Ferrari HA, et al.    Evaluation, treatment, and prevention of vitamin D    deficiency: an Endocrine  Society clinical practice    guideline. JCEM. 2011 Jul; 96(7):1911-30.   Urinalysis, Routine w reflex microscopic     Status: Abnormal   Collection Time: 09/19/21  2:26 PM  Result Value Ref Range   Specific Gravity, UA >1.030 (H) 1.005 - 1.030   pH, UA 5.0 5.0 - 7.5   Color, UA Amber (A) Yellow   Appearance Ur Clear Clear   Leukocytes,UA Negative Negative   Protein,UA 1+ (A) Negative/Trace   Glucose, UA Negative Negative   Ketones, UA Trace (A) Negative   RBC, UA Trace (A) Negative   Bilirubin, UA Negative Negative   Urobilinogen, Ur 1.0 0.2 - 1.0 mg/dL   Nitrite, UA Negative Negative   Microscopic Examination See below:   Microscopic Examination     Status: None   Collection Time: 09/19/21  2:26 PM   Urine  Result Value Ref Range   WBC, UA None seen 0 - 5  /hpf   RBC, Urine 0-2 0 - 2 /hpf   Epithelial Cells (non renal) 0-10 0 - 10 /hpf   Renal Epithel, UA None seen None seen /hpf   Mucus, UA Present Not Estab.   Bacteria, UA None seen None seen/Few  Bladder Scan (Post Void Residual) in office     Status: None   Collection Time: 09/19/21  2:53 PM  Result Value Ref Range   Scan Result 0    Lab Results  Component Value Date   PSA1 1.0 01/10/2021    Studies/Results: No results found.   Assessment/Plan: BPH with BOO and LUTS.  He is improved with tamsulosin but has some increased dizziness since starting the med and his BP is borderline low.   I will change him to silodosin 32m po qday which is more prostate specific and less likely to cause orthostasis.   I will have him return in about 4-6 weeks to assess his response.    Meds ordered this encounter  Medications   silodosin (RAPAFLO) 4 MG CAPS capsule    Sig: Take 1 capsule (4 mg total) by mouth daily with breakfast.    Dispense:  30 capsule    Refill:  11    To replace the tamsulosin because of dizziness.     Orders Placed This Encounter  Procedures   Microscopic Examination   Urinalysis, Routine w reflex microscopic   Bladder Scan (Post Void Residual) in office     Return for 4-6 weeks with me or JSharee Pimpleto assess response to silodosin. .    CC: CEvelina DunNP.      JIrine Seal9/08/2021

## 2021-09-20 ENCOUNTER — Encounter: Payer: Self-pay | Admitting: Urology

## 2021-09-20 NOTE — Telephone Encounter (Signed)
Tried to call patient to request that he contact pharmacy for lower cost alternatives to Silodosin.  Unable to reach patient by phone, left voicemail informing patient to return call.

## 2021-09-24 ENCOUNTER — Ambulatory Visit (INDEPENDENT_AMBULATORY_CARE_PROVIDER_SITE_OTHER): Payer: Medicare Other | Admitting: Family

## 2021-09-24 ENCOUNTER — Encounter: Payer: Self-pay | Admitting: Family

## 2021-09-24 VITALS — BP 110/79 | HR 77 | Temp 96.7°F | Ht 70.0 in | Wt 195.2 lb

## 2021-09-24 DIAGNOSIS — E559 Vitamin D deficiency, unspecified: Secondary | ICD-10-CM | POA: Diagnosis not present

## 2021-09-24 DIAGNOSIS — M1990 Unspecified osteoarthritis, unspecified site: Secondary | ICD-10-CM | POA: Diagnosis not present

## 2021-09-24 DIAGNOSIS — K219 Gastro-esophageal reflux disease without esophagitis: Secondary | ICD-10-CM | POA: Diagnosis not present

## 2021-09-24 DIAGNOSIS — E785 Hyperlipidemia, unspecified: Secondary | ICD-10-CM | POA: Diagnosis not present

## 2021-09-24 DIAGNOSIS — M1611 Unilateral primary osteoarthritis, right hip: Secondary | ICD-10-CM | POA: Diagnosis not present

## 2021-09-24 DIAGNOSIS — G8929 Other chronic pain: Secondary | ICD-10-CM

## 2021-09-24 DIAGNOSIS — I10 Essential (primary) hypertension: Secondary | ICD-10-CM | POA: Diagnosis not present

## 2021-09-24 DIAGNOSIS — J320 Chronic maxillary sinusitis: Secondary | ICD-10-CM

## 2021-09-24 DIAGNOSIS — I4891 Unspecified atrial fibrillation: Secondary | ICD-10-CM | POA: Diagnosis not present

## 2021-09-24 DIAGNOSIS — M545 Low back pain, unspecified: Secondary | ICD-10-CM

## 2021-09-24 DIAGNOSIS — F411 Generalized anxiety disorder: Secondary | ICD-10-CM

## 2021-09-24 DIAGNOSIS — M160 Bilateral primary osteoarthritis of hip: Secondary | ICD-10-CM

## 2021-09-24 DIAGNOSIS — E663 Overweight: Secondary | ICD-10-CM

## 2021-09-24 DIAGNOSIS — Z87891 Personal history of nicotine dependence: Secondary | ICD-10-CM

## 2021-09-24 NOTE — Patient Instructions (Signed)
Health Maintenance After Age 74 After age 74, you are at a higher risk for certain long-term diseases and infections as well as injuries from falls. Falls are a major cause of broken bones and head injuries in people who are older than age 74. Getting regular preventive care can help to keep you healthy and well. Preventive care includes getting regular testing and making lifestyle changes as recommended by your health care provider. Talk with your health care provider about: Which screenings and tests you should have. A screening is a test that checks for a disease when you have no symptoms. A diet and exercise plan that is right for you. What should I know about screenings and tests to prevent falls? Screening and testing are the best ways to find a health problem early. Early diagnosis and treatment give you the best chance of managing medical conditions that are common after age 74. Certain conditions and lifestyle choices may make you more likely to have a fall. Your health care provider may recommend: Regular vision checks. Poor vision and conditions such as cataracts can make you more likely to have a fall. If you wear glasses, make sure to get your prescription updated if your vision changes. Medicine review. Work with your health care provider to regularly review all of the medicines you are taking, including over-the-counter medicines. Ask your health care provider about any side effects that may make you more likely to have a fall. Tell your health care provider if any medicines that you take make you feel dizzy or sleepy. Strength and balance checks. Your health care provider may recommend certain tests to check your strength and balance while standing, walking, or changing positions. Foot health exam. Foot pain and numbness, as well as not wearing proper footwear, can make you more likely to have a fall. Screenings, including: Osteoporosis screening. Osteoporosis is a condition that causes  the bones to get weaker and break more easily. Blood pressure screening. Blood pressure changes and medicines to control blood pressure can make you feel dizzy. Depression screening. You may be more likely to have a fall if you have a fear of falling, feel depressed, or feel unable to do activities that you used to do. Alcohol use screening. Using too much alcohol can affect your balance and may make you more likely to have a fall. Follow these instructions at home: Lifestyle Do not drink alcohol if: Your health care provider tells you not to drink. If you drink alcohol: Limit how much you have to: 0-1 drink a day for women. 0-2 drinks a day for men. Know how much alcohol is in your drink. In the [place] one drink equals one 12 oz bottle of beer (355 mL), one 5 oz glass of wine (148 mL), or one 1 oz glass of hard liquor (44 mL). Do not use any products that contain nicotine or tobacco. These products include cigarettes, chewing tobacco, and vaping devices, such as e-cigarettes. If you need help quitting, ask your health care provider. Activity  Follow a regular exercise program to stay fit. This will help you maintain your balance. Ask your health care provider what types of exercise are appropriate for you. If you need a cane or walker, use it as recommended by your health care provider. Wear supportive shoes that have nonskid soles. Safety  Remove any tripping hazards, such as rugs, cords, and clutter. Install safety equipment such as grab bars in bathrooms and safety rails on stairs. Keep rooms and walkways   well-lit. General instructions Talk with your health care provider about your risks for falling. Tell your health care provider if: You fall. Be sure to tell your health care provider about all falls, even ones that seem minor. You feel dizzy, tiredness (fatigue), or off-balance. Take over-the-counter and prescription medicines only as told by your health care provider. These include  supplements. Eat a healthy diet and maintain a healthy weight. A healthy diet includes low-fat dairy products, low-fat (lean) meats, and fiber from whole grains, beans, and lots of fruits and vegetables. Stay current with your vaccines. Schedule regular health, dental, and eye exams. Summary Having a healthy lifestyle and getting preventive care can help to protect your health and wellness after age 74. Screening and testing are the best way to find a health problem early and help you avoid having a fall. Early diagnosis and treatment give you the best chance for managing medical conditions that are more common for people who are older than age 74. Falls are a major cause of broken bones and head injuries in people who are older than age 74. Take precautions to prevent a fall at home. Work with your health care provider to learn what changes you can make to improve your health and wellness and to prevent falls. This information is not intended to replace advice given to you by your health care provider. Make sure you discuss any questions you have with your health care provider. Document Revised: 05/21/2020 Document Reviewed: 05/21/2020 Elsevier Patient Education  2023 [employer]  

## 2021-09-24 NOTE — Progress Notes (Signed)
Subjective:    Patient ID: Jacob Sanders, male    DOB: 1945/08/08, 76 y.o.   MRN: [id number]  Chief Complaint  Patient presents with   Hypertension   Sinusitis   Fatigue   Dizziness   Pt calls the office today for chronic follow up.  He see's the [place] once a year.  He is followed by Ortho for left rotor cuff tear and was given injection.    He has hx of CVA with mild left arm weakness.    He is followed Cardiologists every 3 months for A Fib.   Has COPE and  he quit smoking 04/2021. He stopped his Breztri. Denies any SOB.  Hypertension This is a chronic problem. The current episode started more than 1 year ago. The problem has been resolved since onset. The problem is controlled. Associated symptoms include anxiety, headaches and malaise/fatigue. Pertinent negatives include no peripheral edema or shortness of breath. Risk factors for coronary artery disease include dyslipidemia, obesity, male gender and sedentary lifestyle. The current treatment provides moderate improvement. There is no history of CVA or heart failure.  Sinusitis This is a recurrent problem. The current episode started 1 to 4 weeks ago. The problem has been waxing and waning since onset. There has been no fever. Associated symptoms include congestion, coughing, headaches, a hoarse voice and sinus pressure. Pertinent negatives include no shortness of breath or sneezing. The treatment provided mild relief.  Dizziness Associated symptoms include congestion, coughing and headaches.  Gastroesophageal Reflux He complains of coughing and a hoarse voice. This is a chronic problem. The current episode started more than 1 year ago. The problem occurs occasionally. The symptoms are aggravated by certain foods. Risk factors include obesity. He has tried a PPI for the symptoms. The treatment provided moderate relief.  Arthritis Presents for follow-up visit. He complains of pain and stiffness. The symptoms have been stable. Affected  locations include the left shoulder, right shoulder, left MCP and right MCP. His pain is at a severity of 10/10.  Hyperlipidemia This is a chronic problem. The current episode started more than 1 year ago. The problem is controlled. Recent lipid tests were reviewed and are normal. Exacerbating diseases include obesity. Pertinent negatives include no shortness of breath. Current antihyperlipidemic treatment includes statins. The current treatment provides moderate improvement of lipids. Risk factors for coronary artery disease include dyslipidemia, male sex, hypertension and a sedentary lifestyle.  Back Pain This is a chronic problem. The current episode started more than 1 year ago. The problem occurs intermittently. The problem has been waxing and waning since onset. The pain is present in the lumbar spine. The quality of the pain is described as aching. The pain is at a severity of 3/10. The pain is mild. Associated symptoms include headaches. The treatment provided mild relief.  Anxiety Presents for follow-up visit. Symptoms include dizziness, excessive worry and nervous/anxious behavior. Patient reports no shortness of breath. Symptoms occur occasionally. The severity of symptoms is moderate.        Review of Systems  Constitutional:  Positive for malaise/fatigue.  HENT:  Positive for congestion, hoarse voice and sinus pressure. Negative for sneezing.   Respiratory:  Positive for cough. Negative for shortness of breath.   Musculoskeletal:  Positive for arthritis, back pain and stiffness.  Neurological:  Positive for dizziness and headaches.  Psychiatric/Behavioral:  The patient is nervous/anxious.   All other systems reviewed and are negative.      Objective:   Physical Exam Vitals  reviewed.  Constitutional:      General: He is not in acute distress.    Appearance: He is well-developed.  HENT:     Head: Normocephalic.     Right Ear: Tympanic membrane normal.     Left Ear:  Tympanic membrane normal.  Eyes:     General:        Right eye: No discharge.        Left eye: No discharge.     Pupils: Pupils are equal, round, and reactive to light.  Neck:     Thyroid: No thyromegaly.  Cardiovascular:     Rate and Rhythm: Normal rate and regular rhythm.     Heart sounds: Normal heart sounds. No murmur heard. Pulmonary:     Effort: Pulmonary effort is normal. No respiratory distress.     Breath sounds: Normal breath sounds. No wheezing.  Abdominal:     General: Bowel sounds are normal. There is no distension.     Palpations: Abdomen is soft.     Tenderness: There is no abdominal tenderness.  Musculoskeletal:        General: No tenderness. Normal range of motion.     Cervical back: Normal range of motion and neck supple.  Skin:    General: Skin is warm and dry.     Findings: No erythema or rash.  Neurological:     Mental Status: He is alert and oriented to person, place, and time.     Cranial Nerves: No cranial nerve deficit.     Deep Tendon Reflexes: Reflexes are normal and symmetric.  Psychiatric:        Behavior: Behavior normal.        Thought Content: Thought content normal.        Judgment: Judgment normal.       BP 110/79   Pulse 77   Temp (!) 96.7 F (35.9 C) (Temporal)   Ht 5' 10"  (1.778 m)   Wt 195 lb 3.2 oz (88.5 kg)   SpO2 98%   BMI 28.01 kg/m      Assessment & Plan:  Jacob Sanders comes in today with chief complaint of Hypertension, Sinusitis, Fatigue, and Dizziness   Diagnosis and orders addressed:  1. Atrial fibrillation, unspecified type ([place]) - CMP14+EGFR  2. Primary hypertension - CMP14+EGFR  3. Chronic maxillary sinusitis - CMP14+EGFR  4. Gastroesophageal reflux disease, unspecified whether esophagitis present - CMP14+EGFR  5. Primary osteoarthritis of right hip - CMP14+EGFR  6. Primary osteoarthritis of both hips - CMP14+EGFR  7. Arthritis - CMP14+EGFR  8. Overweight (BMI 25.0-29.9) - CMP14+EGFR  9.  GAD (generalized anxiety disorder) - CMP14+EGFR  10. Chronic midline low back pain without sciatica - CMP14+EGFR  11. Hyperlipidemia, unspecified hyperlipidemia type - CMP14+EGFR  12. Vitamin D deficiency - CMP14+EGFR  13. History of smoking  - CMP14+EGFR   Labs pending Health Maintenance reviewed Diet and exercise encouraged  Follow up plan: 3 months    Evelina Dun, FNP

## 2021-09-25 LAB — CMP14+EGFR
ALT: 7 IU/L (ref 0–44)
AST: 10 IU/L (ref 0–40)
Albumin/Globulin Ratio: 1.9 (ref 1.2–2.2)
Albumin: 4.3 g/dL (ref 3.8–4.8)
Alkaline Phosphatase: 75 IU/L (ref 44–121)
BUN/Creatinine Ratio: 12 (ref 10–24)
BUN: 13 mg/dL (ref 8–27)
Bilirubin Total: 0.5 mg/dL (ref 0.0–1.2)
CO2: 23 mmol/L (ref 20–29)
Calcium: 9.1 mg/dL (ref 8.6–10.2)
Chloride: 102 mmol/L (ref 96–106)
Creatinine, Ser: 1.1 mg/dL (ref 0.76–1.27)
Globulin, Total: 2.3 g/dL (ref 1.5–4.5)
Glucose: 117 mg/dL — ABNORMAL HIGH (ref 70–99)
Potassium: 4.2 mmol/L (ref 3.5–5.2)
Sodium: 141 mmol/L (ref 134–144)
Total Protein: 6.6 g/dL (ref 6.0–8.5)
eGFR: 70 mL/min/{1.73_m2} (ref >59)

## 2021-10-08 ENCOUNTER — Other Ambulatory Visit: Payer: Self-pay | Admitting: Cardiovascular Disease

## 2021-10-08 ENCOUNTER — Other Ambulatory Visit: Payer: Self-pay | Admitting: Family

## 2021-10-08 DIAGNOSIS — F411 Generalized anxiety disorder: Secondary | ICD-10-CM

## 2021-10-24 ENCOUNTER — Other Ambulatory Visit: Payer: Self-pay | Admitting: *Deleted

## 2021-10-24 MED ORDER — DILTIAZEM HCL ER 180 MG PO TB24: 180.0000 mg | ORAL_TABLET | Freq: Every day | ORAL | 0 refills | Status: DC

## 2021-10-28 ENCOUNTER — Other Ambulatory Visit: Payer: Self-pay | Admitting: Family

## 2021-10-28 MED ORDER — DILTIAZEM HCL ER COATED BEADS 180 MG PO CP24: 180.0000 mg | ORAL_CAPSULE | Freq: Every day | ORAL | 1 refills | Status: DC

## 2021-10-30 ENCOUNTER — Ambulatory Visit: Payer: Medicare Other | Admitting: Physician Assistant

## 2021-11-01 ENCOUNTER — Other Ambulatory Visit: Payer: Self-pay | Admitting: Family

## 2021-12-26 ENCOUNTER — Ambulatory Visit (INDEPENDENT_AMBULATORY_CARE_PROVIDER_SITE_OTHER): Payer: Medicare Other | Admitting: Family

## 2021-12-26 ENCOUNTER — Encounter: Payer: Self-pay | Admitting: Family

## 2021-12-26 VITALS — BP 128/85 | HR 102 | Temp 97.3°F | Ht 70.0 in | Wt 199.6 lb

## 2021-12-26 DIAGNOSIS — Z0001 Encounter for general adult medical examination with abnormal findings: Secondary | ICD-10-CM | POA: Diagnosis not present

## 2021-12-26 DIAGNOSIS — E663 Overweight: Secondary | ICD-10-CM

## 2021-12-26 DIAGNOSIS — M160 Bilateral primary osteoarthritis of hip: Secondary | ICD-10-CM | POA: Diagnosis not present

## 2021-12-26 DIAGNOSIS — I6930 Unspecified sequelae of cerebral infarction: Secondary | ICD-10-CM

## 2021-12-26 DIAGNOSIS — M19012 Primary osteoarthritis, left shoulder: Secondary | ICD-10-CM

## 2021-12-26 DIAGNOSIS — R399 Unspecified symptoms and signs involving the genitourinary system: Secondary | ICD-10-CM

## 2021-12-26 DIAGNOSIS — M1990 Unspecified osteoarthritis, unspecified site: Secondary | ICD-10-CM

## 2021-12-26 DIAGNOSIS — K5900 Constipation, unspecified: Secondary | ICD-10-CM | POA: Diagnosis not present

## 2021-12-26 DIAGNOSIS — J320 Chronic maxillary sinusitis: Secondary | ICD-10-CM | POA: Diagnosis not present

## 2021-12-26 DIAGNOSIS — M545 Low back pain, unspecified: Secondary | ICD-10-CM

## 2021-12-26 DIAGNOSIS — E559 Vitamin D deficiency, unspecified: Secondary | ICD-10-CM

## 2021-12-26 DIAGNOSIS — E785 Hyperlipidemia, unspecified: Secondary | ICD-10-CM

## 2021-12-26 DIAGNOSIS — I10 Essential (primary) hypertension: Secondary | ICD-10-CM

## 2021-12-26 DIAGNOSIS — F320 Major depressive disorder, single episode, mild: Secondary | ICD-10-CM

## 2021-12-26 DIAGNOSIS — J019 Acute sinusitis, unspecified: Secondary | ICD-10-CM

## 2021-12-26 DIAGNOSIS — Z0000 Encounter for general adult medical examination without abnormal findings: Secondary | ICD-10-CM

## 2021-12-26 DIAGNOSIS — K219 Gastro-esophageal reflux disease without esophagitis: Secondary | ICD-10-CM

## 2021-12-26 DIAGNOSIS — I4891 Unspecified atrial fibrillation: Secondary | ICD-10-CM

## 2021-12-26 DIAGNOSIS — M1611 Unilateral primary osteoarthritis, right hip: Secondary | ICD-10-CM

## 2021-12-26 DIAGNOSIS — F411 Generalized anxiety disorder: Secondary | ICD-10-CM

## 2021-12-26 DIAGNOSIS — G8929 Other chronic pain: Secondary | ICD-10-CM

## 2021-12-26 LAB — URINALYSIS, COMPLETE
Bilirubin, UA: NEGATIVE
Glucose, UA: NEGATIVE
Leukocytes,UA: NEGATIVE
Nitrite, UA: NEGATIVE
RBC, UA: NEGATIVE
Specific Gravity, UA: 1.025 (ref 1.005–1.030)
Urobilinogen, Ur: 1 mg/dL (ref 0.2–1.0)
pH, UA: 5 (ref 5.0–7.5)

## 2021-12-26 LAB — MICROSCOPIC EXAMINATION
Bacteria, UA: NONE SEEN
Epithelial Cells (non renal): NONE SEEN /hpf (ref 0–10)
RBC, Urine: NONE SEEN /hpf (ref 0–2)
Renal Epithel, UA: NONE SEEN /hpf
WBC, UA: NONE SEEN /hpf (ref 0–5)

## 2021-12-26 MED ORDER — AMOXICILLIN-POT CLAVULANATE 875-125 MG PO TABS: 1.0000 | ORAL_TABLET | Freq: Two times a day (BID) | ORAL | 0 refills | Status: DC

## 2021-12-26 NOTE — Progress Notes (Signed)
Subjective:    Patient ID: Jacob Sanders, male    DOB: [birth year], 76 y.o.   MRN: [id number]  Chief Complaint  Patient presents with   Medical Management of Chronic Issues   Urinary Tract Infection   Sinus Problem   Pt calls the office today for CPE and chronic follow up.  He see's the [place] once a year.  He has seen Ortho in the past for left rotor cuff tear and was given injection.    He has hx of CVA with mild left arm weakness.    He is followed Cardiologists every 3 months for A Fib.   Has COPD and he quit smoking 04/2021. He stopped his Breztri. Denies any SOB.  Sinus Problem This is a new problem. The current episode started 1 to 4 weeks ago. The problem has been gradually worsening since onset. His pain is at a severity of 8/10. The pain is moderate. Associated symptoms include congestion, coughing, headaches, a hoarse voice, sinus pressure, sneezing and a sore throat. Pertinent negatives include no chills or shortness of breath. Past treatments include acetaminophen. The treatment provided mild relief.  Hypertension This is a chronic problem. The current episode started more than 1 year ago. The problem has been resolved since onset. The problem is controlled. Associated symptoms include anxiety, headaches and malaise/fatigue. Pertinent negatives include no peripheral edema or shortness of breath. Risk factors for coronary artery disease include dyslipidemia, obesity and male gender. The current treatment provides moderate improvement. Hypertensive end-organ damage includes CVA.  Gastroesophageal Reflux He complains of belching, coughing, heartburn, a hoarse voice and a sore throat. He reports no nausea. This is a chronic problem. The current episode started more than 1 year ago. The problem occurs occasionally. The problem has been waxing and waning. Associated symptoms include fatigue. Risk factors include obesity. He has tried a PPI for the symptoms. The treatment provided moderate  relief.  Arthritis Presents for follow-up visit. He complains of pain and stiffness. The symptoms have been stable. Affected locations include the right knee, left knee, right MCP, left MCP, left shoulder and right shoulder. His pain is at a severity of 8/10. Associated symptoms include dysuria and fatigue.  Hyperlipidemia This is a chronic problem. The current episode started more than 1 year ago. The problem is controlled. Pertinent negatives include no shortness of breath. Current antihyperlipidemic treatment includes statins. The current treatment provides moderate improvement of lipids. Risk factors for coronary artery disease include dyslipidemia, hypertension, male sex and a sedentary lifestyle.  Anxiety Presents for follow-up visit. Symptoms include depressed mood, excessive worry, irritability, nervous/anxious behavior and restlessness. Patient reports no nausea or shortness of breath. Symptoms occur occasionally. The severity of symptoms is moderate.    Constipation This is a chronic problem. The current episode started more than 1 year ago. The problem has been waxing and waning since onset. Associated symptoms include back pain. Pertinent negatives include no nausea or vomiting. Risk factors include obesity. The treatment provided mild relief.  Back Pain This is a chronic problem. The current episode started more than 1 year ago. The problem occurs intermittently. The problem has been waxing and waning since onset. The pain is present in the lumbar spine. The quality of the pain is described as aching. The pain is at a severity of 3/10. The pain is moderate. Associated symptoms include dysuria and headaches.  Dysuria  This is a new problem. The current episode started in the past 7 days (three days ag).  The problem occurs intermittently. The problem has been waxing and waning. The quality of the pain is described as burning. The pain is mild. Associated symptoms include urgency. Pertinent  negatives include no chills, discharge, flank pain, frequency, hematuria, hesitancy, nausea or vomiting. He has tried increased fluids for the symptoms. The treatment provided mild relief.  Depression        This is a chronic problem.  The current episode started more than 1 year ago.   The onset quality is gradual.   The problem occurs intermittently.  The problem has been gradually worsening since onset.  Associated symptoms include fatigue, helplessness, hopelessness, restlessness, headaches and sad.  Past treatments include nothing.  Past medical history includes anxiety.       Review of Systems  Constitutional:  Positive for fatigue, irritability and malaise/fatigue. Negative for chills.  HENT:  Positive for congestion, hoarse voice, sinus pressure, sneezing and sore throat.   Respiratory:  Positive for cough. Negative for shortness of breath.   Gastrointestinal:  Positive for constipation and heartburn. Negative for nausea and vomiting.  Genitourinary:  Positive for dysuria and urgency. Negative for flank pain, frequency, hematuria and hesitancy.  Musculoskeletal:  Positive for arthritis, back pain and stiffness.  Neurological:  Positive for headaches.  Psychiatric/Behavioral:  Positive for depression. The patient is nervous/anxious.   All other systems reviewed and are negative.  Family History  Problem Relation Age of Onset   Hypertension Mother    Aneurysm Father    Hypertension Father    Social History   Socioeconomic History   Marital status: Married    Spouse name: Anne Ng   Number of children: 3   Years of education: 10   Highest education level: 10th grade  Occupational History   Occupation: Retired  Tobacco Use   Smoking status: Former    Packs/day: 1.00    Years: 45.00    Total pack years: 45.00    Types: Cigars, Cigarettes    Quit date: 04/13/2021    Years since quitting: 0.7   Smokeless tobacco: Never   Tobacco comments:    4-5 daily  Vaping Use   Vaping  Use: Never used  Substance and Sexual Activity   Alcohol use: No   Drug use: No   Sexual activity: Not Currently  Other Topics Concern   Not on file  Social History Narrative   [place] Benefits   Social Determinants of Health   Financial Resource Strain: Low Risk  (06/03/2021)   Overall Financial Resource Strain (CARDIA)    Difficulty of Paying Living Expenses: Not hard at all  Food Insecurity: No Food Insecurity (06/03/2021)   Hunger Vital Sign    Worried About Running Out of Food in the Last Year: Never true    Hooven in the Last Year: Never true  Transportation Needs: No Transportation Needs (06/03/2021)   PRAPARE - Hydrologist (Medical): No    Lack of Transportation (Non-Medical): No  Physical Activity: Insufficiently Active (06/03/2021)   Exercise Vital Sign    Days of Exercise per Week: 7 days    Minutes of Exercise per Session: 10 min  Stress: No Stress Concern Present (06/03/2021)   [place]    Feeling of Stress : Not at all  Social Connections: [place] (06/03/2021)   Social Connection and Isolation Panel [NHANES]    Frequency of Communication with Friends and Family: More than three times  a week    Frequency of Social Gatherings with Friends and Family: Three times a week    Attends Religious Services: More than 4 times per year    Active Member of Clubs or Organizations: Yes    Attends Archivist Meetings: More than 4 times per year    Marital Status: Married        Objective:   Physical Exam Vitals reviewed.  Constitutional:      General: He is not in acute distress.    Appearance: He is well-developed. He is obese.  HENT:     Head: Normocephalic.     Right Ear: Tympanic membrane normal.     Left Ear: Tympanic membrane normal.  Eyes:     General:        Right eye: No discharge.        Left eye: No discharge.     Pupils: Pupils are  equal, round, and reactive to light.  Neck:     Thyroid: No thyromegaly.  Cardiovascular:     Rate and Rhythm: Normal rate and regular rhythm.     Heart sounds: Normal heart sounds. No murmur heard. Pulmonary:     Effort: Pulmonary effort is normal. No respiratory distress.     Breath sounds: Rhonchi present. No wheezing.  Abdominal:     General: Bowel sounds are normal. There is no distension.     Palpations: Abdomen is soft.     Tenderness: There is no abdominal tenderness.  Musculoskeletal:        General: No tenderness. Normal range of motion.     Cervical back: Normal range of motion and neck supple.  Skin:    General: Skin is warm and dry.     Findings: No erythema or rash.  Neurological:     Mental Status: He is alert and oriented to person, place, and time.     Cranial Nerves: No cranial nerve deficit.     Deep Tendon Reflexes: Reflexes are normal and symmetric.  Psychiatric:        Behavior: Behavior normal.        Thought Content: Thought content normal.        Judgment: Judgment normal.       BP 128/85   Pulse (!) 102   Temp (!) 97.3 F (36.3 C) (Temporal)   Ht _0  (1.778 m)   Wt 199 lb 9.6 oz (90.5 kg)   SpO2 98%   BMI 28.64 kg/m       Assessment & Plan:  CARMELLO CABINESS comes in today with chief complaint of Medical Management of Chronic Issues, Urinary Tract Infection, and Sinus Problem   Diagnosis and orders addressed:  1. UTI symptoms - Urinalysis, Complete - Urine Culture - CMP14+EGFR - CBC with Differential/Platelet  2. Primary hypertension - CMP14+EGFR - CBC with Differential/Platelet  3. Vitamin D deficiency - CMP14+EGFR - CBC with Differential/Platelet - VITAMIN D 25 Hydroxy (Vit-D Deficiency, Fractures)  4. Primary osteoarthritis of left shoulder - CMP14+EGFR - CBC with Differential/Platelet  5. Overweight (BMI 25.0-29.9) - CMP14+EGFR - CBC with Differential/Platelet  6. Primary osteoarthritis of both hips -  CMP14+EGFR - CBC with Differential/Platelet  7. Primary osteoarthritis of right hip - CMP14+EGFR - CBC with Differential/Platelet  8. Hyperlipidemia, unspecified hyperlipidemia type - CMP14+EGFR - CBC with Differential/Platelet  9. Gastroesophageal reflux disease, unspecified whether esophagitis present - CMP14+EGFR - CBC with Differential/Platelet  10. GAD (generalized anxiety disorder)  - CMP14+EGFR - CBC with Differential/Platelet  11. Constipation, unspecified constipation type - CMP14+EGFR - CBC with Differential/Platelet  12. Arthritis - CMP14+EGFR - CBC with Differential/Platelet  13. Atrial fibrillation, unspecified type (HCC) - CMP14+EGFR - CBC with Differential/Platelet  14. Late effect of cerebrovascular accident (CVA) - CMP14+EGFR - CBC with Differential/Platelet  15. Chronic maxillary sinusitis - CMP14+EGFR - CBC with Differential/Platelet  16. Chronic midline low back pain without sciatica - CMP14+EGFR - CBC with Differential/Platelet  17. Depression, major, single episode, mild ([place]) -refuses medications at this time. Wants to think about Lexapro and let me know.  - CMP14+EGFR - CBC with Differential/Platelet  18. Acute sinusitis, recurrence not specified, unspecified location - Take meds as prescribed - Use a cool mist humidifier  -Use saline nose sprays frequently -Force fluids -For any cough or congestion  Use plain Mucinex- regular strength or max strength is fine -For fever or aces or pains- take tylenol or ibuprofen. -Throat lozenges if help Follow up if symptoms worsen or do not improve  - amoxicillin-clavulanate (AUGMENTIN) 875-125 MG tablet; Take 1 tablet by mouth 2 (two) times daily.  Dispense: 14 tablet; Refill: 0 - CMP14+EGFR - CBC with Differential/Platelet  19. Annual physical exam - CMP14+EGFR - CBC with Differential/Platelet - Lipid panel - TSH - VITAMIN D 25 Hydroxy (Vit-D Deficiency, Fractures)   Labs  pending Health Maintenance reviewed- Refuses vaccines and CT scan Diet and exercise encouraged  Follow up plan: 4 months    Evelina Dun, FNP

## 2021-12-26 NOTE — Patient Instructions (Signed)

## 2021-12-27 ENCOUNTER — Other Ambulatory Visit: Payer: Self-pay | Admitting: Family

## 2021-12-27 DIAGNOSIS — K219 Gastro-esophageal reflux disease without esophagitis: Secondary | ICD-10-CM

## 2021-12-27 LAB — CMP14+EGFR
ALT: 11 IU/L (ref 0–44)
AST: 14 IU/L (ref 0–40)
Albumin/Globulin Ratio: 1.9 (ref 1.2–2.2)
Albumin: 4.4 g/dL (ref 3.8–4.8)
Alkaline Phosphatase: 71 IU/L (ref 44–121)
BUN/Creatinine Ratio: 25 — ABNORMAL HIGH (ref 10–24)
BUN: 26 mg/dL (ref 8–27)
Bilirubin Total: 0.6 mg/dL (ref 0.0–1.2)
CO2: 24 mmol/L (ref 20–29)
Calcium: 9.4 mg/dL (ref 8.6–10.2)
Chloride: 103 mmol/L (ref 96–106)
Creatinine, Ser: 1.04 mg/dL (ref 0.76–1.27)
Globulin, Total: 2.3 g/dL (ref 1.5–4.5)
Glucose: 115 mg/dL — ABNORMAL HIGH (ref 70–99)
Potassium: 4.9 mmol/L (ref 3.5–5.2)
Sodium: 142 mmol/L (ref 134–144)
Total Protein: 6.7 g/dL (ref 6.0–8.5)
eGFR: 75 mL/min/{1.73_m2} (ref >59)

## 2021-12-27 LAB — CBC WITH DIFFERENTIAL/PLATELET
Basophils Absolute: 0 10*3/uL (ref 0.0–0.2)
Basos: 0 %
EOS (ABSOLUTE): 0 10*3/uL (ref 0.0–0.4)
Eos: 0 %
Hematocrit: 48.3 % (ref 37.5–51.0)
Hemoglobin: 15.9 g/dL (ref 13.0–17.7)
Immature Grans (Abs): 0 10*3/uL (ref 0.0–0.1)
Immature Granulocytes: 0 %
Lymphocytes Absolute: 1.5 10*3/uL (ref 0.7–3.1)
Lymphs: 15 %
MCH: 30.5 pg (ref 26.6–33.0)
MCHC: 32.9 g/dL (ref 31.5–35.7)
MCV: 93 fL (ref 79–97)
Monocytes Absolute: 0.7 10*3/uL (ref 0.1–0.9)
Monocytes: 7 %
Neutrophils Absolute: 7.3 10*3/uL — ABNORMAL HIGH (ref 1.4–7.0)
Neutrophils: 78 %
Platelets: 246 10*3/uL (ref 150–450)
RBC: 5.21 x10E6/uL (ref 4.14–5.80)
RDW: 12.1 % (ref 11.6–15.4)
WBC: 9.6 10*3/uL (ref 3.4–10.8)

## 2021-12-27 LAB — LIPID PANEL
Chol/HDL Ratio: 2.6 ratio (ref 0.0–5.0)
Cholesterol, Total: 144 mg/dL (ref 100–199)
HDL: 55 mg/dL (ref >39)
LDL Chol Calc (NIH): 73 mg/dL (ref 0–99)
Triglycerides: 81 mg/dL (ref 0–149)
VLDL Cholesterol Cal: 16 mg/dL (ref 5–40)

## 2021-12-27 LAB — TSH: TSH: 2 u[IU]/mL (ref 0.450–4.500)

## 2021-12-27 LAB — VITAMIN D 25 HYDROXY (VIT D DEFICIENCY, FRACTURES): Vit D, 25-Hydroxy: 50.6 ng/mL (ref 30.0–100.0)

## 2021-12-28 LAB — URINE CULTURE

## 2022-01-02 ENCOUNTER — Telehealth: Payer: Self-pay | Admitting: Family

## 2022-01-02 NOTE — Telephone Encounter (Signed)
Pt wants to know if PCP can send in a Rx for him for Meloxicam 15mg  for his arthritis. Says he has severe arthritis which knows about and forgot to mention it to her at his appt last week. Says he has taken it then past and it really helped him.   Please advise.  [facility] to [facility] [place].

## 2022-01-02 NOTE — Telephone Encounter (Signed)
Pt aware of provider feedback and voiced understanding. 

## 2022-01-02 NOTE — Telephone Encounter (Signed)
Can not take mobic because he is on Eliquis

## 2022-01-06 ENCOUNTER — Other Ambulatory Visit: Payer: Self-pay | Admitting: Family

## 2022-01-09 ENCOUNTER — Ambulatory Visit: Payer: Medicare Other | Admitting: Family

## 2022-01-15 ENCOUNTER — Other Ambulatory Visit: Payer: Self-pay | Admitting: Family

## 2022-01-15 DIAGNOSIS — F411 Generalized anxiety disorder: Secondary | ICD-10-CM

## 2022-01-19 ENCOUNTER — Other Ambulatory Visit: Payer: Self-pay | Admitting: Family

## 2022-01-19 DIAGNOSIS — N401 Enlarged prostate with lower urinary tract symptoms: Secondary | ICD-10-CM

## 2022-02-24 ENCOUNTER — Other Ambulatory Visit: Payer: Self-pay | Admitting: Cardiovascular Disease

## 2022-03-05 DIAGNOSIS — H353132 Nonexudative age-related macular degeneration, bilateral, intermediate dry stage: Secondary | ICD-10-CM | POA: Diagnosis not present

## 2022-03-07 ENCOUNTER — Telehealth: Payer: Self-pay | Admitting: Family

## 2022-03-07 MED ORDER — DILTIAZEM HCL ER 180 MG PO TB24: 180.0000 mg | ORAL_TABLET | Freq: Every day | ORAL | 0 refills | Status: DC

## 2022-03-07 NOTE — Telephone Encounter (Signed)
[facility] SENT PATIENT AWARE

## 2022-03-07 NOTE — Telephone Encounter (Signed)
Pt called stating that [facility] is out of stock of his DILTIAZEM Rx. Wants to know if Alyse Low can send [facility] to [facility] [place]. Pt only has a few pills left.

## 2022-03-20 ENCOUNTER — Encounter: Payer: Self-pay | Admitting: Family

## 2022-03-20 ENCOUNTER — Ambulatory Visit (INDEPENDENT_AMBULATORY_CARE_PROVIDER_SITE_OTHER): Payer: Medicare Other | Admitting: Family

## 2022-03-20 VITALS — BP 135/89 | HR 65 | Temp 97.9°F | Ht 70.0 in | Wt 196.0 lb

## 2022-03-20 DIAGNOSIS — J019 Acute sinusitis, unspecified: Secondary | ICD-10-CM | POA: Diagnosis not present

## 2022-03-20 DIAGNOSIS — R399 Unspecified symptoms and signs involving the genitourinary system: Secondary | ICD-10-CM | POA: Diagnosis not present

## 2022-03-20 LAB — URINALYSIS
Bilirubin, UA: NEGATIVE
Glucose, UA: NEGATIVE
Ketones, UA: NEGATIVE
Leukocytes,UA: NEGATIVE
Nitrite, UA: NEGATIVE
Protein,UA: NEGATIVE
Specific Gravity, UA: 1.02 (ref 1.005–1.030)
Urobilinogen, Ur: 0.2 mg/dL (ref 0.2–1.0)
pH, UA: 5.5 (ref 5.0–7.5)

## 2022-03-20 MED ORDER — AMOXICILLIN-POT CLAVULANATE 875-125 MG PO TABS: 1.0000 | ORAL_TABLET | Freq: Two times a day (BID) | ORAL | 0 refills | Status: DC

## 2022-03-20 NOTE — Progress Notes (Signed)
Subjective:    Patient ID: Jacob Sanders, male    DOB: [birth year], 77 y.o.   MRN: [id number]  Chief Complaint  Patient presents with   Sinusitis    COUPLE OF WEEKS    Urinary Tract Infection    Sinusitis This is a new problem. The current episode started 1 to 4 weeks ago. The problem has been gradually worsening since onset. There has been no fever. His pain is at a severity of 6/10. The pain is moderate. Associated symptoms include congestion, headaches, a hoarse voice, sinus pressure, sneezing and a sore throat. Pertinent negatives include no shortness of breath. Past treatments include oral decongestants. The treatment provided mild relief.  Urinary Tract Infection  This is a new problem. The current episode started 1 to 4 weeks ago. The problem occurs intermittently. The problem has been waxing and waning. The quality of the pain is described as burning. The pain is at a severity of 2/10. Associated symptoms include urgency. Pertinent negatives include no frequency, hematuria, nausea or vomiting. He has tried increased fluids for the symptoms. The treatment provided mild relief.      Review of Systems  HENT:  Positive for congestion, hoarse voice, sinus pressure, sneezing and sore throat.   Respiratory:  Negative for shortness of breath.   Gastrointestinal:  Negative for nausea and vomiting.  Genitourinary:  Positive for urgency. Negative for frequency and hematuria.  Neurological:  Positive for headaches.  All other systems reviewed and are negative.      Objective:   Physical Exam Vitals reviewed.  Constitutional:      General: He is not in acute distress.    Appearance: He is well-developed.  HENT:     Head: Normocephalic.     Right Ear: Tympanic membrane normal.     Left Ear: Tympanic membrane normal.     Nose:     Right Sinus: Maxillary sinus tenderness present.     Left Sinus: Maxillary sinus tenderness present.  Eyes:     General:        Right eye: No  discharge.        Left eye: No discharge.     Pupils: Pupils are equal, round, and reactive to light.  Neck:     Thyroid: No thyromegaly.  Cardiovascular:     Rate and Rhythm: Normal rate and regular rhythm.     Heart sounds: Normal heart sounds. No murmur heard. Pulmonary:     Effort: Pulmonary effort is normal. No respiratory distress.     Breath sounds: Normal breath sounds. No wheezing.  Abdominal:     General: Bowel sounds are normal. There is no distension.     Palpations: Abdomen is soft.     Tenderness: There is no abdominal tenderness.  Musculoskeletal:        General: No tenderness. Normal range of motion.     Cervical back: Normal range of motion and neck supple.  Skin:    General: Skin is warm and dry.     Findings: No erythema or rash.  Neurological:     Mental Status: He is alert and oriented to person, place, and time.     Cranial Nerves: No cranial nerve deficit.     Deep Tendon Reflexes: Reflexes are normal and symmetric.  Psychiatric:        Behavior: Behavior normal.        Thought Content: Thought content normal.        Judgment: Judgment normal.  BP 135/89   Pulse 65   Temp 97.9 F (36.6 C) (Temporal)   Ht '5\' 10"'$  (1.778 m)   Wt 196 lb (88.9 kg)   SpO2 95%   BMI 28.12 kg/m      Assessment & Plan:  [name] comes in today with chief complaint of Sinusitis (COUPLE OF WEEKS ) and Urinary Tract Infection   Diagnosis and orders addressed:  1. UTI symptoms Negative  Force fluids - Urinalysis  2. Acute sinusitis, recurrence not specified, unspecified location - Take meds as prescribed - Use a cool mist humidifier  -Use saline nose sprays frequently -Force fluids -For any cough or congestion  Use plain Mucinex- regular strength or max strength is fine -For fever or aces or pains- take tylenol or ibuprofen. -Throat lozenges if help -Follow up if symptoms worsen or do not improve  - amoxicillin-clavulanate (AUGMENTIN) 875-125 MG  tablet; Take 1 tablet by mouth 2 (two) times daily.  Dispense: 14 tablet; Refill: 0     Evelina Dun, FNP

## 2022-03-20 NOTE — Patient Instructions (Signed)

## 2022-03-21 LAB — URINE CULTURE: Organism ID, Bacteria: NO GROWTH

## 2022-03-27 ENCOUNTER — Other Ambulatory Visit: Payer: Self-pay | Admitting: Cardiovascular Disease

## 2022-03-28 NOTE — Telephone Encounter (Signed)
Please contact pt for future appointment. Pt overdue for 6 month f/u. Pt needing refills. 

## 2022-03-30 ENCOUNTER — Other Ambulatory Visit: Payer: Self-pay | Admitting: Family

## 2022-04-22 ENCOUNTER — Telehealth: Payer: Self-pay | Admitting: Family

## 2022-04-22 NOTE — Telephone Encounter (Signed)
Pt called requesting to speak with nurse. Wants to know if we can order Agent Orange testing?

## 2022-04-24 ENCOUNTER — Encounter: Payer: Self-pay | Admitting: Family

## 2022-04-24 ENCOUNTER — Telehealth (INDEPENDENT_AMBULATORY_CARE_PROVIDER_SITE_OTHER): Payer: Medicare Other | Admitting: Family

## 2022-04-24 DIAGNOSIS — J441 Chronic obstructive pulmonary disease with (acute) exacerbation: Secondary | ICD-10-CM | POA: Diagnosis not present

## 2022-04-24 MED ORDER — PREDNISONE 20 MG PO TABS: 40.0000 mg | ORAL_TABLET | Freq: Every day | ORAL | 0 refills | Status: AC

## 2022-04-24 MED ORDER — DOXYCYCLINE HYCLATE 100 MG PO TABS: 100.0000 mg | ORAL_TABLET | Freq: Two times a day (BID) | ORAL | 0 refills | Status: DC

## 2022-04-24 MED ORDER — BENZONATATE 200 MG PO CAPS
200.0000 mg | ORAL_CAPSULE | Freq: Three times a day (TID) | ORAL | 1 refills | Status: DC | PRN
Start: 2022-04-24 — End: 2022-12-31

## 2022-04-24 NOTE — Telephone Encounter (Signed)
This is something he can get free done at the [place].

## 2022-04-24 NOTE — Progress Notes (Signed)
Virtual Visit Consent   Jacob Sanders, you are scheduled for a virtual visit with a [place] provider today. Just as with appointments in the office, your consent must be obtained to participate. Your consent will be active for this visit and any virtual visit you may have with one of our providers in the next 365 days. If you have a MyChart account, a copy of this consent can be sent to you electronically.  As this is a virtual visit, video technology does not allow for your provider to perform a traditional examination. This may limit your provider's ability to fully assess your condition. If your provider identifies any concerns that need to be evaluated in person or the need to arrange testing (such as labs, EKG, etc.), we will make arrangements to do so. Although advances in technology are sophisticated, we cannot ensure that it will always work on either your end or our end. If the connection with a video visit is poor, the visit may have to be switched to a telephone visit. With either a video or telephone visit, we are not always able to ensure that we have a secure connection.  By engaging in this virtual visit, you consent to the provision of healthcare and authorize for your insurance to be billed (if applicable) for the services provided during this visit. Depending on your insurance coverage, you may receive a charge related to this service.  I need to obtain your verbal consent now. Are you willing to proceed with your visit today? Jacob Sanders has provided verbal consent on 04/24/2022 for a virtual visit (video or telephone). Jannifer Rodneyhristy Tawney Vanorman, FNP  Date: 04/24/2022 2:01 PM  Virtual Visit via Video Note   I, [name], connected with  Jacob Sanders  ([id number], 08-30-1945) on 04/24/22 at  1:55 PM EDT by a video-enabled telemedicine application and verified that I am speaking with the correct person using two identifiers.  Location: Patient: Virtual Visit Location Patient:  Home Provider: Virtual Visit Location Provider: Office/Clinic   I discussed the limitations of evaluation and management by telemedicine and the availability of in person appointments. The patient expressed understanding and agreed to proceed.    History of Present Illness: Jacob Dakinshurman E Feeley is a 77 y.o. who identifies as a male who was assigned male at birth, and is being seen today for coughing for two weeks.  HPI: Cough This is a new problem. The current episode started 1 to 4 weeks ago. The problem has been gradually worsening. The problem occurs every few minutes. The cough is Productive of purulent sputum. Associated symptoms include myalgias, nasal congestion, postnasal drip, shortness of breath and wheezing. Pertinent negatives include no chills, ear congestion, ear pain, fever or headaches. He has tried rest for the symptoms. The treatment provided mild relief. His past medical history is significant for emphysema. There is no history of bronchitis.    Problems:  Patient Active Problem List   Diagnosis Date Noted   Depression, major, single episode, mild 12/26/2021   Primary osteoarthritis of left shoulder 06/07/2021   Chronic left shoulder pain 06/07/2021   Late effect of cerebrovascular accident (CVA) 01/10/2021   Chest pain 08/08/2020   Chronic sinusitis 05/31/2020   Chronic rhinitis 05/31/2020   Degeneration of lumbar or lumbosacral intervertebral disc 05/31/2020   Dermatophytosis of nail 05/31/2020   Personal history of tobacco use, presenting hazards to health 05/31/2020   Chronic back pain 11/16/2018   RLS (restless legs syndrome) 05/28/2018  Seborrheic keratoses 10/08/2017   GAD (generalized anxiety disorder) 02/25/2017   Constipation 08/21/2016   Osteoarthritis of right hip 07/17/2016   Osteoarthritis, hip, bilateral 03/27/2016   Atrial fibrillation 03/21/2016   Overweight (BMI 25.0-29.9) 03/21/2016   Pain in both lower extremities 02/27/2016   History of smoking  12/14/2014   Vitamin D deficiency 05/17/2014   GERD (gastroesophageal reflux disease) 05/15/2014   Arthritis 03/10/2014   Primary hypertension 04/20/2012   Gout, unspecified 04/20/2012   Hyperlipemia 04/20/2012    Allergies:  Allergies  Allergen Reactions   Morphine    Warfarin And Related Other (See Comments)    Bleeding.   Morphine And Related     Agitation    Medications:  Current Outpatient Medications:    benzonatate (TESSALON) 200 MG capsule, Take 1 capsule (200 mg total) by mouth 3 (three) times daily as needed., Disp: 30 capsule, Rfl: 1   doxycycline (VIBRA-TABS) 100 MG tablet, Take 1 tablet (100 mg total) by mouth 2 (two) times daily., Disp: 20 tablet, Rfl: 0   predniSONE (DELTASONE) 20 MG tablet, Take 2 tablets (40 mg total) by mouth daily with breakfast for 5 days., Disp: 10 tablet, Rfl: 0   apixaban (ELIQUIS) 5 MG TABS tablet, Take 1 tablet (5 mg total) by mouth 2 (two) times daily., Disp: 180 tablet, Rfl: 3   atorvastatin (LIPITOR) 20 MG tablet, TAKE 1 TABLET BY MOUTH DAILY, Disp: 90 tablet, Rfl: 1   busPIRone (BUSPAR) 5 MG tablet, TAKE 1 TABLET BY MOUTH TWICE  DAILY, Disp: 200 tablet, Rfl: 0   diclofenac Sodium (VOLTAREN) 1 % GEL, Apply 2 g topically 4 (four) times daily., Disp: 150 g, Rfl: 0   diltiazem (CARDIZEM CD) 180 MG 24 hr capsule, TAKE 1 CAPSULE BY MOUTH DAILY, Disp: 100 capsule, Rfl: 2   diltiazem (CARDIZEM LA) 180 MG 24 hr tablet, TAKE 1 TABLET BY MOUTH EVERY DAY, Disp: 90 tablet, Rfl: 1   meclizine (ANTIVERT) 25 MG tablet, TAKE 1 TABLET BY MOUTH 3 TIMES  DAILY AS NEEDED FOR DIZZINESS, Disp: 180 tablet, Rfl: 0   omeprazole (PRILOSEC) 20 MG capsule, TAKE 1 CAPSULE BY MOUTH DAILY AS NEEDED, Disp: 100 capsule, Rfl: 1   sodium chloride (OCEAN) 0.65 % SOLN nasal spray, Place 1 spray into both nostrils as needed for congestion., Disp: 60 mL, Rfl: 0   tamsulosin (FLOMAX) 0.4 MG CAPS capsule, Take 0.4 mg by mouth., Disp: , Rfl:    Vitamin D, Ergocalciferol, (DRISDOL)  1.25 MG (50000 UNIT) CAPS capsule, Take 1 capsule (50,000 Units total) by mouth every 7 (seven) days., Disp: 12 capsule, Rfl: 3  Observations/Objective: Patient is well-developed, well-nourished in no acute distress.  Resting comfortably  at home.  Head is normocephalic, atraumatic.  No labored breathing.  Speech is clear and coherent with logical content.  Patient is alert and oriented at baseline.  Nonproductive cough  Assessment and Plan: 1. COPD exacerbation - doxycycline (VIBRA-TABS) 100 MG tablet; Take 1 tablet (100 mg total) by mouth 2 (two) times daily.  Dispense: 20 tablet; Refill: 0 - predniSONE (DELTASONE) 20 MG tablet; Take 2 tablets (40 mg total) by mouth daily with breakfast for 5 days.  Dispense: 10 tablet; Refill: 0 - benzonatate (TESSALON) 200 MG capsule; Take 1 capsule (200 mg total) by mouth 3 (three) times daily as needed.  Dispense: 30 capsule; Refill: 1  - Take meds as prescribed - Use a cool mist humidifier  -Use saline nose sprays frequently -Force fluids -For any cough or  congestion  Use plain Mucinex- regular strength or max strength is fine -For fever or aces or pains- take tylenol or ibuprofen. -Throat lozenges if help -Follow up if symptoms worsen or do not improve   Follow Up Instructions: I discussed the assessment and treatment plan with the patient. The patient was provided an opportunity to ask questions and all were answered. The patient agreed with the plan and demonstrated an understanding of the instructions.  A copy of instructions were sent to the patient via MyChart unless otherwise noted below.     The patient was advised to call back or seek an in-person evaluation if the symptoms worsen or if the condition fails to improve as anticipated.  Time:  I spent 8 minutes with the patient via telehealth technology discussing the above problems/concerns.    Jannifer Rodney, FNP

## 2022-04-24 NOTE — Telephone Encounter (Signed)
Patient aware.

## 2022-06-01 ENCOUNTER — Other Ambulatory Visit: Payer: Self-pay | Admitting: Cardiovascular Disease

## 2022-06-24 NOTE — Telephone Encounter (Signed)
Patient states he is going to talk to his wife and he may go to the [place] to get his medications, he will call us back and let us know.

## 2022-07-15 ENCOUNTER — Ambulatory Visit: Payer: Medicare Other | Attending: Cardiovascular Disease | Admitting: Cardiovascular Disease

## 2022-07-15 ENCOUNTER — Encounter: Payer: Self-pay | Admitting: Cardiovascular Disease

## 2022-07-15 VITALS — BP 120/70 | HR 86 | Ht 70.0 in | Wt 190.0 lb

## 2022-07-15 DIAGNOSIS — E785 Hyperlipidemia, unspecified: Secondary | ICD-10-CM | POA: Diagnosis not present

## 2022-07-15 DIAGNOSIS — I10 Essential (primary) hypertension: Secondary | ICD-10-CM

## 2022-07-15 DIAGNOSIS — Z720 Tobacco use: Secondary | ICD-10-CM | POA: Diagnosis not present

## 2022-07-15 DIAGNOSIS — M79605 Pain in left leg: Secondary | ICD-10-CM | POA: Diagnosis not present

## 2022-07-15 DIAGNOSIS — I482 Chronic atrial fibrillation, unspecified: Secondary | ICD-10-CM | POA: Diagnosis not present

## 2022-07-15 NOTE — Progress Notes (Signed)
Cardiology Office Note   Date:  07/15/2022   ID:  Jacob Sanders, DOB August 09, 1945, MRN [id number]  PCP:  Junie Spencer, FNP  Cardiologist:   Lorine Bears, MD   No chief complaint on file.     History of Present Illness: Jacob Sanders is a 77 y.o. male who is here today for follow-up visit regarding chronic atrial fibrillation. He has known history of chronic atrial fibrillation, essential hypertension and prolonged history of tobacco use. He had ABI done in 2017 for leg pain which was normal with no evidence of PAD.  He had nuclear stress testing in 2018 for exertional dyspnea and abnormal EKG that was normal. The patient had previous bilateral hip replacement.  Anticoagulation with warfarin in the past was associated with hematuria.    He had atypical chest pain in 2022 and thus underwent cardiac work-up that included a Lexiscan Myoview that showed no evidence of ischemia. Echocardiogram in July 2022 showed normal LV systolic function with severe biatrial enlargement, mild mitral regurgitation and mildly dilated ascending aorta at 37 mm.  He had a stroke in December 2022 but fortunately with no residual neurologic symptoms.  He was able to quit smoking in 2023.  He has been doing well with no chest pain, shortness of breath or palpitations.  He describes worsening exertional left leg pain.  He does have some numbness in the thigh area.   Past Medical History:  Diagnosis Date   Arthritis    Atrial fibrillation (HCC)    Dysrhythmia    afib   GERD (gastroesophageal reflux disease)    History of kidney stones    passed   Hyperlipidemia    Hypertension    Nerve damage    left hand after accident- 1995   Seasonal allergies    Staph infection    post back surgeries 1998 and1999    Past Surgical History:  Procedure Laterality Date   BACK SURGERY  4098,1191   discectomy   HAND SURGERY Left 1995   OPEN REDUCTION INTERNAL FIXATION (ORIF) DISTAL RADIAL FRACTURE  Left 07/29/2019   Procedure: OPEN REDUCTION INTERNAL FIXATION (ORIF) LEFT DISTAL RADIAL FRACTURE;  Surgeon: Dominica Severin, MD;  Location: [facility] OR;  Service: Orthopedics;  Laterality: Left;  90 mins   TONSILLECTOMY     childhood    TOOTH EXTRACTION  3 weeks ago ;   had abcess beneath tooth; experiened some bleeding for 20 hours post op   TOTAL HIP ARTHROPLASTY Left 05/22/2016   Procedure: LEFT TOTAL HIP ARTHROPLASTY ANTERIOR APPROACH;  Surgeon: Samson Frederic, MD;  Location: [facility] ORS;  Service: Orthopedics;  Laterality: Left;  Dr. requesting RNFA   TOTAL HIP ARTHROPLASTY Right 07/17/2016   Procedure: RIGHT TOTAL HIP ARTHROPLASTY ANTERIOR APPROACH;  Surgeon: Samson Frederic, MD;  Location: [facility] ORS;  Service: Orthopedics;  Laterality: Right;  needs RNFA     Current Outpatient Medications  Medication Sig Dispense Refill   apixaban (ELIQUIS) 5 MG TABS tablet Take 1 tablet (5 mg total) by mouth 2 (two) times daily. 180 tablet 3   atorvastatin (LIPITOR) 20 MG tablet TAKE 1 TABLET BY MOUTH DAILY 90 tablet 1   busPIRone (BUSPAR) 5 MG tablet TAKE 1 TABLET BY MOUTH TWICE  DAILY 200 tablet 0   diclofenac Sodium (VOLTAREN) 1 % GEL Apply 2 g topically 4 (four) times daily. 150 g 0   diltiazem (CARDIZEM LA) 180 MG 24 hr tablet TAKE 1 TABLET BY MOUTH EVERY DAY 90 tablet 1  meclizine (ANTIVERT) 25 MG tablet TAKE 1 TABLET BY MOUTH 3 TIMES  DAILY AS NEEDED FOR DIZZINESS 180 tablet 0   omeprazole (PRILOSEC) 20 MG capsule TAKE 1 CAPSULE BY MOUTH DAILY AS NEEDED 100 capsule 1   sodium chloride (OCEAN) 0.65 % SOLN nasal spray Place 1 spray into both nostrils as needed for congestion. 60 mL 0   benzonatate (TESSALON) 200 MG capsule Take 1 capsule (200 mg total) by mouth 3 (three) times daily as needed. (Patient not taking: Reported on 07/15/2022) 30 capsule 1   diltiazem (CARDIZEM CD) 180 MG 24 hr capsule TAKE 1 CAPSULE BY MOUTH DAILY (Patient not taking: Reported on 07/15/2022) 100 capsule 2   doxycycline (VIBRA-TABS) 100  MG tablet Take 1 tablet (100 mg total) by mouth 2 (two) times daily. (Patient not taking: Reported on 07/15/2022) 20 tablet 0   tamsulosin (FLOMAX) 0.4 MG CAPS capsule Take 0.4 mg by mouth. (Patient not taking: Reported on 07/15/2022)     Vitamin D, Ergocalciferol, (DRISDOL) 1.25 MG (50000 UNIT) CAPS capsule Take 1 capsule (50,000 Units total) by mouth every 7 (seven) days. (Patient not taking: Reported on 07/15/2022) 12 capsule 3   No current facility-administered medications for this visit.    Allergies:   Morphine, Warfarin and related, and Morphine and codeine    Social History:  The patient  reports that he quit smoking about 15 months ago. His smoking use included cigars and cigarettes. He has a 45.00 pack-year smoking history. He has never used smokeless tobacco. He reports that he does not drink alcohol and does not use drugs.   Family History:  The patient's family history includes Aneurysm in his father; Hypertension in his father and mother.    ROS:  Please see the history of present illness.   Otherwise, review of systems are positive for none.   All other systems are reviewed and negative.    PHYSICAL EXAM: VS:  BP 120/70   Pulse 86   Ht 5\' 10"  (1.778 m)   Wt 190 lb (86.2 kg)   SpO2 99%   BMI 27.26 kg/m  , BMI Body mass index is 27.26 kg/m. GEN: Well nourished, well developed, in no acute distress  HEENT: normal  Neck: no JVD, carotid bruits, or masses Cardiac: ; Irregularly irregular no murmurs, rubs, or gallops,no edema  Respiratory:  clear to auscultation bilaterally, normal work of breathing GI: soft, nontender, nondistended, + BS MS: no deformity or atrophy  Skin: warm and dry, no rash Neuro:  Strength and sensation are intact Psych: euthymic mood, full affect Vascular: Femoral pulses normal bilaterally.  Distal pulses are stronger on the right than the left.  EKG:  EKG is ordered today. EKG showed atrial fibrillation with ventricular rate of 89 bpm, right bundle  branch block and nonspecific T wave changes   Recent Labs: 12/26/2021: ALT 11; BUN 26; Creatinine, Ser 1.04; Hemoglobin 15.9; Platelets 246; Potassium 4.9; Sodium 142; TSH 2.000    Lipid Panel    Component Value Date/Time   CHOL 144 12/26/2021 1037   TRIG 81 12/26/2021 1037   HDL 55 12/26/2021 1037   CHOLHDL 2.6 12/26/2021 1037   LDLCALC 73 12/26/2021 1037      Wt Readings from Last 3 Encounters:  07/15/22 190 lb (86.2 kg)  03/20/22 196 lb (88.9 kg)  12/26/21 199 lb 9.6 oz (90.5 kg)         12/18/2015    9:26 AM  PAD Screen  Previous PAD dx? No  Previous surgical procedure? No  Pain with walking? Yes  Subsides with rest? No  Feet/toe relief with dangling? No  Painful, non-healing ulcers? No  Extremities discolored? No      ASSESSMENT AND PLAN:   1. Chronic atrial fibrillation: Ventricular rate is controlled on diltiazem.  He is tolerating anticoagulation with Eliquis 5 mg twice daily.     His CHA2DS2-VASc score is 4.    2.  Exertional dyspnea: Likely due to underlying lung disease.  Echocardiogram and Lexiscan Myoview in 2022 were unremarkable.    3. Essential hypertension: Blood pressure is well controlled today.  4.  Exertional left leg pain: Slightly diminished pulses distally.  I requested lower extremity arterial Doppler.  5.  Hyperlipidemia: Continue atorvastatin 20 mg once daily.  Most recent lipid profile showed an LDL of 66.     Disposition:   FU with me in 6 months.   Signed,  Lorine Bears, MD  07/15/2022 10:08 AM    [facility]

## 2022-07-15 NOTE — Patient Instructions (Signed)
Medication Instructions:  No changes *If you need a refill on your cardiac medications before your next appointment, please call your pharmacy*   Lab Work: None ordered If you have labs (blood work) drawn today and your tests are completely normal, you will receive your results only by: MyChart Message (if you have MyChart) OR A paper copy in the mail If you have any lab test that is abnormal or we need to change your treatment, we will call you to review the results.   Testing/Procedures: Your physician has requested that you have a lower extremity segmental doppler. This will take place at [address] [facility], [address].    Follow-Up: At [facility], you and your health needs are our priority.  As part of our continuing mission to provide you with exceptional heart care, we have created designated Provider Care Teams.  These Care Teams include your primary Cardiologist (physician) and Advanced Practice Providers (APPs -  Physician Assistants and Nurse Practitioners) who all work together to provide you with the care you need, when you need it.  We recommend signing up for the patient portal called "MyChart".  Sign up information is provided on this After Visit Summary.  MyChart is used to connect with patients for Virtual Visits (Telemedicine).  Patients are able to view lab/test results, encounter notes, upcoming appointments, etc.  Non-urgent messages can be sent to your provider as well.   To learn more about what you can do with MyChart, go to [email or web address].    Your next appointment:   6 month(s)  Provider:   Lorine Bears, MD

## 2022-07-16 ENCOUNTER — Telehealth: Payer: Self-pay | Admitting: Cardiovascular Disease

## 2022-07-16 NOTE — Telephone Encounter (Signed)
Patient's wife is requesting a call back to discuss EKG results.

## 2022-07-16 NOTE — Telephone Encounter (Signed)
Patient's wife called with concerns about EKG results from office visit 07/15/22. Patient's wife informed that EKG was viewed by Dr. Kirke Corin at visit. Patient's wife verbalizes understanding.

## 2022-07-18 ENCOUNTER — Ambulatory Visit (INDEPENDENT_AMBULATORY_CARE_PROVIDER_SITE_OTHER): Payer: Medicare Other

## 2022-07-18 VITALS — Ht 70.0 in | Wt 190.0 lb

## 2022-07-18 DIAGNOSIS — Z0000 Encounter for general adult medical examination without abnormal findings: Secondary | ICD-10-CM | POA: Diagnosis not present

## 2022-07-18 NOTE — Progress Notes (Signed)
Subjective:   Jacob Sanders is a 77 y.o. male who presents for Medicare Annual/Subsequent preventive examination.  Visit Complete: Virtual  I connected with  [name] on 07/18/22 by a audio enabled telemedicine application and verified that I am speaking with the correct person using two identifiers.  Patient Location: Home  Provider Location: Home Office  I discussed the limitations of evaluation and management by telemedicine. The patient expressed understanding and agreed to proceed.  Patient Medicare AWV questionnaire was completed by the patient on 07/18/2022; I have confirmed that all information answered by patient is correct and no changes since this date.  Review of Systems     Cardiac Risk Factors include: advanced age (>19men, >45 women);male gender;dyslipidemia;hypertension     Objective:    Today's Vitals   07/18/22 1418  Weight: 190 lb (86.2 kg)  Height: 5\' 10"  (1.778 m)   Body mass index is 27.26 kg/m.     07/18/2022    2:23 PM 06/03/2021   10:42 AM 05/31/2020   11:35 AM 07/29/2019    1:32 PM 05/31/2019    1:49 PM 05/21/2018    1:44 PM 07/18/2016    7:50 AM  Advanced Directives  Does Patient Have a Medical Advance Directive? Yes No No No Yes No No  Type of Estate agent of [place];Living will    Living will    Does patient want to make changes to medical advance directive?     No - Patient declined    Copy of Healthcare Power of Attorney in Chart? No - copy requested        Would patient like information on creating a medical advance directive?  No - Patient declined No - Patient declined No - Patient declined  No - Patient declined No - Patient declined    Current Medications (verified) Outpatient Encounter Medications as of 07/18/2022  Medication Sig   apixaban (ELIQUIS) 5 MG TABS tablet Take 1 tablet (5 mg total) by mouth 2 (two) times daily.   atorvastatin (LIPITOR) 20 MG tablet TAKE 1 TABLET BY MOUTH DAILY   benzonatate  (TESSALON) 200 MG capsule Take 1 capsule (200 mg total) by mouth 3 (three) times daily as needed.   busPIRone (BUSPAR) 5 MG tablet TAKE 1 TABLET BY MOUTH TWICE  DAILY   diclofenac Sodium (VOLTAREN) 1 % GEL Apply 2 g topically 4 (four) times daily.   diltiazem (CARDIZEM CD) 180 MG 24 hr capsule TAKE 1 CAPSULE BY MOUTH DAILY   diltiazem (CARDIZEM LA) 180 MG 24 hr tablet TAKE 1 TABLET BY MOUTH EVERY DAY   doxycycline (VIBRA-TABS) 100 MG tablet Take 1 tablet (100 mg total) by mouth 2 (two) times daily.   meclizine (ANTIVERT) 25 MG tablet TAKE 1 TABLET BY MOUTH 3 TIMES  DAILY AS NEEDED FOR DIZZINESS   omeprazole (PRILOSEC) 20 MG capsule TAKE 1 CAPSULE BY MOUTH DAILY AS NEEDED   sodium chloride (OCEAN) 0.65 % SOLN nasal spray Place 1 spray into both nostrils as needed for congestion.   tamsulosin (FLOMAX) 0.4 MG CAPS capsule Take 0.4 mg by mouth.   Vitamin D, Ergocalciferol, (DRISDOL) 1.25 MG (50000 UNIT) CAPS capsule Take 1 capsule (50,000 Units total) by mouth every 7 (seven) days.   No facility-administered encounter medications on file as of 07/18/2022.    Allergies (verified) Morphine, Warfarin and related, and Morphine and codeine   History: Past Medical History:  Diagnosis Date   Arthritis    Atrial fibrillation (HCC)  Dysrhythmia    afib   GERD (gastroesophageal reflux disease)    History of kidney stones    passed   Hyperlipidemia    Hypertension    Nerve damage    left hand after accident- 1995   Seasonal allergies    Staph infection    post back surgeries 1998 and1999   Past Surgical History:  Procedure Laterality Date   BACK SURGERY  1610,9604   discectomy   HAND SURGERY Left 1995   OPEN REDUCTION INTERNAL FIXATION (ORIF) DISTAL RADIAL FRACTURE Left 07/29/2019   Procedure: OPEN REDUCTION INTERNAL FIXATION (ORIF) LEFT DISTAL RADIAL FRACTURE;  Surgeon: Dominica Severin, MD;  Location: [facility] OR;  Service: Orthopedics;  Laterality: Left;  90 mins   TONSILLECTOMY     childhood     TOOTH EXTRACTION  3 weeks ago ;   had abcess beneath tooth; experiened some bleeding for 20 hours post op   TOTAL HIP ARTHROPLASTY Left 05/22/2016   Procedure: LEFT TOTAL HIP ARTHROPLASTY ANTERIOR APPROACH;  Surgeon: Samson Frederic, MD;  Location: [facility];  Service: Orthopedics;  Laterality: Left;  Dr. requesting RNFA   TOTAL HIP ARTHROPLASTY Right 07/17/2016   Procedure: RIGHT TOTAL HIP ARTHROPLASTY ANTERIOR APPROACH;  Surgeon: Samson Frederic, MD;  Location: [facility];  Service: Orthopedics;  Laterality: Right;  needs RNFA   Family History  Problem Relation Age of Onset   Hypertension Mother    Aneurysm Father    Hypertension Father    Social History   Socioeconomic History   Marital status: Married    Spouse name: Steffanie Dunn   Number of children: 3   Years of education: 10   Highest education level: 10th grade  Occupational History   Occupation: Retired  Tobacco Use   Smoking status: Former    Packs/day: 1.00    Years: 45.00    Additional pack years: 0.00    Total pack years: 45.00    Types: Cigars, Cigarettes    Quit date: 04/13/2021    Years since quitting: 1.2   Smokeless tobacco: Never   Tobacco comments:    4-5 daily  Vaping Use   Vaping Use: Never used  Substance and Sexual Activity   Alcohol use: No   Drug use: No   Sexual activity: Not Currently  Other Topics Concern   Not on file  Social History Narrative   [place] Benefits   Social Determinants of Health   Financial Resource Strain: Low Risk  (07/18/2022)   Overall Financial Resource Strain (CARDIA)    Difficulty of Paying Living Expenses: Not hard at all  Food Insecurity: No Food Insecurity (07/18/2022)   Hunger Vital Sign    Worried About Running Out of Food in the Last Year: Never true    Ran Out of Food in the Last Year: Never true  Transportation Needs: No Transportation Needs (07/18/2022)   PRAPARE - Administrator, Civil Service (Medical): No    Lack of Transportation (Non-Medical): No  Physical  Activity: Inactive (07/18/2022)   Exercise Vital Sign    Days of Exercise per Week: 0 days    Minutes of Exercise per Session: 0 min  Stress: No Stress Concern Present (07/18/2022)   [employer] of Occupational Health - Occupational Stress Questionnaire    Feeling of Stress : Not at all  Social Connections: Moderately Integrated (07/18/2022)   Social Connection and Isolation Panel [NHANES]    Frequency of Communication with Friends and Family: More than three times a week  Frequency of Social Gatherings with Friends and Family: More than three times a week    Attends Religious Services: More than 4 times per year    Active Member of [employer] or Organizations: No    Attends Engineer, structural: Never    Marital Status: Married    Tobacco Counseling Counseling given: Not Answered Tobacco comments: 4-5 daily   Clinical Intake:  Pre-visit preparation completed: Yes  Pain : No/denies pain     Nutritional Risks: None Diabetes: No  How often do you need to have someone help you when you read instructions, pamphlets, or other written materials from your doctor or pharmacy?: 1 - Never  Interpreter Needed?: No  Information entered by :: Renie Ora, LPN   Activities of Daily Living    07/18/2022    2:23 PM  In your present state of health, do you have any difficulty performing the following activities:  Hearing? 0  Vision? 0  Difficulty concentrating or making decisions? 0  Walking or climbing stairs? 0  Dressing or bathing? 0  Doing errands, shopping? 0  Preparing Food and eating ? N  Using the Toilet? N  In the past six months, have you accidently leaked urine? N  Do you have problems with loss of bowel control? N  Managing your Medications? N  Managing your Finances? N  Housekeeping or managing your Housekeeping? N    Patient Care Team: Junie Spencer, FNP as PCP - General (Nurse Practitioner) Iran Ouch, MD as PCP - Cardiology  (Cardiology) Jenene Slicker Deberah Castle, MD as Consulting Physician (Ophthalmology)  Indicate any recent Medical Services you may have received from other than [facility] providers in the past year (date may be approximate).     Assessment:   This is a routine wellness examination for [facility].  Hearing/Vision screen Vision Screening - Comments:: Wears rx glasses - up to date with routine eye exams with  Dr.Groat   Dietary issues and exercise activities discussed:     Goals Addressed             This Visit's Progress    Prevent falls   On track      Depression Screen    07/18/2022    2:22 PM 03/20/2022    8:40 AM 12/26/2021   10:00 AM 09/24/2021   10:54 AM 06/25/2021    9:47 AM 06/07/2021    8:41 AM 06/03/2021   10:40 AM  PHQ 2/9 Scores  PHQ - 2 Score 0 6 0 0 0 0 0  PHQ- 9 Score  21 0  0 0     Fall Risk    07/18/2022    2:20 PM 03/20/2022    8:40 AM 12/26/2021    9:59 AM 09/24/2021   10:50 AM 06/25/2021    9:47 AM  Fall Risk   Falls in the past year? 0 0 0 0 0  Number falls in past yr: 0   0   Injury with Fall? 0   0   Risk for fall due to : No Fall Risks   No Fall Risks   Follow up Falls prevention discussed   Education provided   Comment    age     MEDICARE RISK AT HOME:  Medicare Risk at Home - 07/18/22 1420     Any stairs in or around the home? No    If so, are there any without handrails? No    Home free of loose throw rugs in  walkways, pet beds, electrical cords, etc? Yes    Adequate lighting in your home to reduce risk of falls? Yes    Life alert? No    Use of a cane, walker or w/c? Yes    Grab bars in the bathroom? Yes    Shower chair or bench in shower? No    Elevated toilet seat or a handicapped toilet? No             TIMED UP AND GO:  Was the test performed?  No    Cognitive Function:        07/18/2022    2:24 PM 06/03/2021   10:46 AM 05/31/2019    1:49 PM 05/21/2018    1:48 PM  6CIT Screen  What Year? 0 points 0 points 0 points 0 points  What month? 0  points 0 points 0 points 0 points  What time? 0 points 0 points 0 points 0 points  Count back from 20 0 points 0 points 0 points 0 points  Months in reverse 0 points 0 points 0 points 0 points  Repeat phrase 0 points 2 points 2 points 0 points  Total Score 0 points 2 points 2 points 0 points    Immunizations Immunization History  Administered Date(s) Administered   Td 06/25/2021   Tdap 05/09/2011    TDAP status: Up to date  Flu Vaccine status: Up to date  Pneumococcal vaccine status: Up to date  Covid-19 vaccine status: Declined, Education has been provided regarding the importance of this vaccine but patient still declined. Advised may receive this vaccine at local pharmacy or Health Dept.or vaccine clinic. Aware to provide a copy of the vaccination record if obtained from local pharmacy or Health Dept. Verbalized acceptance and understanding.  Qualifies for Shingles Vaccine? Yes   Zostavax completed No   Shingrix Completed?: No.    Education has been provided regarding the importance of this vaccine. Patient has been advised to call insurance company to determine out of pocket expense if they have not yet received this vaccine. Advised may also receive vaccine at local pharmacy or Health Dept. Verbalized acceptance and understanding.  Screening Tests Health Maintenance  Topic Date Due   COVID-19 Vaccine (1) Never done   Zoster Vaccines- Shingrix (1 of 2) Never done   Colonoscopy  Never done   Lung Cancer Screening  12/27/2022 (Originally 05/22/2017)   Pneumonia Vaccine 49+ Years old (1 of 1 - PCV) 12/27/2022 (Originally 07/29/2012)   INFLUENZA VACCINE  08/14/2022   Medicare Annual Wellness (AWV)  07/18/2023   DTaP/Tdap/Td (3 - Td or Tdap) 06/26/2031   Hepatitis C Screening  Completed   HPV VACCINES  Aged Out    Health Maintenance  Health Maintenance Due  Topic Date Due   COVID-19 Vaccine (1) Never done   Zoster Vaccines- Shingrix (1 of 2) Never done   Colonoscopy   Never done    Colorectal cancer screening: Referral to GI placed declined . Pt aware the office will call re: appt.  Lung Cancer Screening: (Low Dose CT Chest recommended if Age 75-80 years, 20 pack-year currently smoking OR have quit w/in 15years.) does not qualify.   Lung Cancer Screening Referral: n/a  Additional Screening:  Hepatitis C Screening: does not qualify; Completed 05/15/2014  Vision Screening: Recommended annual ophthalmology exams for early detection of glaucoma and other disorders of the eye. Is the patient up to date with their annual eye exam?  Yes  Who is the provider or  what is the name of the office in which the patient attends annual eye exams? Dr.Groat  If pt is not established with a provider, would they like to be referred to a provider to establish care? No .   Dental Screening: Recommended annual dental exams for proper oral hygiene    Community Resource Referral / Chronic Care Management: CRR required this visit?  No   CCM required this visit?  No     Plan:     I have personally reviewed and noted the following in the patient's chart:   Medical and social history Use of alcohol, tobacco or illicit drugs  Current medications and supplements including opioid prescriptions. Patient is not currently taking opioid prescriptions. Functional ability and status Nutritional status Physical activity Advanced directives List of other physicians Hospitalizations, surgeries, and ER visits in previous 12 months Vitals Screenings to include cognitive, depression, and falls Referrals and appointments  In addition, I have reviewed and discussed with patient certain preventive protocols, quality metrics, and best practice recommendations. A written personalized care plan for preventive services as well as general preventive health recommendations were provided to patient.     Lorrene Reid, LPN   06/15/1306   After Visit Summary: (MyChart) Due to this  being a telephonic visit, the after visit summary with patients personalized plan was offered to patient via MyChart   Nurse Notes: Per patient Pneumonia vaccine was at [facility]

## 2022-07-18 NOTE — Patient Instructions (Signed)
Jacob Sanders , Thank you for taking time to come for your Medicare Wellness Visit. I appreciate your ongoing commitment to your health goals. Please review the following plan we discussed and let me know if I can assist you in the future.   These are the goals we discussed:  Goals      Exercise 150 min/wk Moderate Activity     Have 3 meals a day     Prevent falls        This is a list of the screening recommended for you and due dates:  Health Maintenance  Topic Date Due   COVID-19 Vaccine (1) Never done   Zoster (Shingles) Vaccine (1 of 2) Never done   Colon Cancer Screening  Never done   Screening for Lung Cancer  12/27/2022*   Pneumonia Vaccine (1 of 1 - PCV) 12/27/2022*   Flu Shot  08/14/2022   Medicare Annual Wellness Visit  07/18/2023   DTaP/Tdap/Td vaccine (3 - Td or Tdap) 06/26/2031   Hepatitis C Screening  Completed   HPV Vaccine  Aged Out  *Topic was postponed. The date shown is not the original due date.    Advanced directives: Advance directive discussed with you today. I have provided a copy for you to complete at home and have notarized. Once this is complete please bring a copy in to our office so we can scan it into your chart. Information on Advanced Care Planning can be found at [facility] [place] Advance Health Care Directives Advance Health Care Directives ([email or web address]    Conditions/risks identified: Aim for 30 minutes of exercise or brisk walking, 6-8 glasses of water, and 5 servings of fruits and vegetables each day.   Next appointment: Follow up in one year for your annual wellness visit.   Preventive Care 62 Years and Older, Male  Preventive care refers to lifestyle choices and visits with your health care provider that can promote health and wellness. What does preventive care include? A yearly physical exam. This is also called an annual well check. Dental exams once or twice a year. Routine eye exams. Ask your health care provider how  often you should have your eyes checked. Personal lifestyle choices, including: Daily care of your teeth and gums. Regular physical activity. Eating a healthy diet. Avoiding tobacco and drug use. Limiting alcohol use. Practicing safe sex. Taking low doses of aspirin every day. Taking vitamin and mineral supplements as recommended by your health care provider. What happens during an annual well check? The services and screenings done by your health care provider during your annual well check will depend on your age, overall health, lifestyle risk factors, and family history of disease. Counseling  Your health care provider may ask you questions about your: Alcohol use. Tobacco use. Drug use. Emotional well-being. Home and relationship well-being. Sexual activity. Eating habits. History of falls. Memory and ability to understand (cognition). Work and work Astronomer. Screening  You may have the following tests or measurements: Height, weight, and BMI. Blood pressure. Lipid and cholesterol levels. These may be checked every 5 years, or more frequently if you are over 38 years old. Skin check. Lung cancer screening. You may have this screening every year starting at age 14 if you have a 30-pack-year history of smoking and currently smoke or have quit within the past 15 years. Fecal occult blood test (FOBT) of the stool. You may have this test every year starting at age 33. Flexible sigmoidoscopy or colonoscopy. You  may have a sigmoidoscopy every 5 years or a colonoscopy every 10 years starting at age 19. Prostate cancer screening. Recommendations will vary depending on your family history and other risks. Hepatitis C blood test. Hepatitis B blood test. Sexually transmitted disease (STD) testing. Diabetes screening. This is done by checking your blood sugar (glucose) after you have not eaten for a while (fasting). You may have this done every 1-3 years. Abdominal aortic aneurysm  (AAA) screening. You may need this if you are a current or former smoker. Osteoporosis. You may be screened starting at age 86 if you are at high risk. Talk with your health care provider about your test results, treatment options, and if necessary, the need for more tests. Vaccines  Your health care provider may recommend certain vaccines, such as: Influenza vaccine. This is recommended every year. Tetanus, diphtheria, and acellular pertussis (Tdap, Td) vaccine. You may need a Td booster every 10 years. Zoster vaccine. You may need this after age 30. Pneumococcal 13-valent conjugate (PCV13) vaccine. One dose is recommended after age 34. Pneumococcal polysaccharide (PPSV23) vaccine. One dose is recommended after age 16. Talk to your health care provider about which screenings and vaccines you need and how often you need them. This information is not intended to replace advice given to you by your health care provider. Make sure you discuss any questions you have with your health care provider. Document Released: 01/26/2015 Document Revised: 09/19/2015 Document Reviewed: 10/31/2014 Elsevier Interactive Patient Education  2017 [employer].  Fall Prevention in the Home Falls can cause injuries. They can happen to people of all ages. There are many things you can do to make your home safe and to help prevent falls. What can I do on the outside of my home? Regularly fix the edges of walkways and driveways and fix any cracks. Remove anything that might make you trip as you walk through a door, such as a raised step or threshold. Trim any bushes or trees on the path to your home. Use bright outdoor lighting. Clear any walking paths of anything that might make someone trip, such as rocks or tools. Regularly check to see if handrails are loose or broken. Make sure that both sides of any steps have handrails. Any raised decks and porches should have guardrails on the edges. Have any leaves, snow, or  ice cleared regularly. Use sand or salt on walking paths during winter. Clean up any spills in your garage right away. This includes oil or grease spills. What can I do in the bathroom? Use night lights. Install grab bars by the toilet and in the tub and shower. Do not use towel bars as grab bars. Use non-skid mats or decals in the tub or shower. If you need to sit down in the shower, use a plastic, non-slip stool. Keep the floor dry. Clean up any water that spills on the floor as soon as it happens. Remove soap buildup in the tub or shower regularly. Attach bath mats securely with double-sided non-slip rug tape. Do not have throw rugs and other things on the floor that can make you trip. What can I do in the bedroom? Use night lights. Make sure that you have a light by your bed that is easy to reach. Do not use any sheets or blankets that are too big for your bed. They should not hang down onto the floor. Have a firm chair that has side arms. You can use this for support while you get  dressed. Do not have throw rugs and other things on the floor that can make you trip. What can I do in the kitchen? Clean up any spills right away. Avoid walking on wet floors. Keep items that you use a lot in easy-to-reach places. If you need to reach something above you, use a strong step stool that has a grab bar. Keep electrical cords out of the way. Do not use floor polish or wax that makes floors slippery. If you must use wax, use non-skid floor wax. Do not have throw rugs and other things on the floor that can make you trip. What can I do with my stairs? Do not leave any items on the stairs. Make sure that there are handrails on both sides of the stairs and use them. Fix handrails that are broken or loose. Make sure that handrails are as long as the stairways. Check any carpeting to make sure that it is firmly attached to the stairs. Fix any carpet that is loose or worn. Avoid having throw rugs at  the top or bottom of the stairs. If you do have throw rugs, attach them to the floor with carpet tape. Make sure that you have a light switch at the top of the stairs and the bottom of the stairs. If you do not have them, ask someone to add them for you. What else can I do to help prevent falls? Wear shoes that: Do not have high heels. Have rubber bottoms. Are comfortable and fit you well. Are closed at the toe. Do not wear sandals. If you use a stepladder: Make sure that it is fully opened. Do not climb a closed stepladder. Make sure that both sides of the stepladder are locked into place. Ask someone to hold it for you, if possible. Clearly mark and make sure that you can see: Any grab bars or handrails. First and last steps. Where the edge of each step is. Use tools that help you move around (mobility aids) if they are needed. These include: Canes. Walkers. Scooters. Crutches. Turn on the lights when you go into a dark area. Replace any light bulbs as soon as they burn out. Set up your furniture so you have a clear path. Avoid moving your furniture around. If any of your floors are uneven, fix them. If there are any pets around you, be aware of where they are. Review your medicines with your doctor. Some medicines can make you feel dizzy. This can increase your chance of falling. Ask your doctor what other things that you can do to help prevent falls. This information is not intended to replace advice given to you by your health care provider. Make sure you discuss any questions you have with your health care provider. Document Released: 10/26/2008 Document Revised: 06/07/2015 Document Reviewed: 02/03/2014 Elsevier Interactive Patient Education  2017 [employer].

## 2022-07-29 ENCOUNTER — Other Ambulatory Visit: Payer: Self-pay | Admitting: Cardiovascular Disease

## 2022-07-29 DIAGNOSIS — I10 Essential (primary) hypertension: Secondary | ICD-10-CM

## 2022-07-29 DIAGNOSIS — I482 Chronic atrial fibrillation, unspecified: Secondary | ICD-10-CM

## 2022-07-29 DIAGNOSIS — M79605 Pain in left leg: Secondary | ICD-10-CM

## 2022-07-29 DIAGNOSIS — Z720 Tobacco use: Secondary | ICD-10-CM

## 2022-07-29 DIAGNOSIS — E785 Hyperlipidemia, unspecified: Secondary | ICD-10-CM

## 2022-07-29 DIAGNOSIS — I739 Peripheral vascular disease, unspecified: Secondary | ICD-10-CM

## 2022-07-31 ENCOUNTER — Ambulatory Visit (HOSPITAL_COMMUNITY)
Admission: RE | Admit: 2022-07-31 | Discharge: 2022-07-31 | Disposition: A | Discharge status: Home / Self Care | Payer: Medicare Other | Source: Ambulatory Visit | Attending: Cardiovascular Disease | Admitting: Cardiovascular Disease

## 2022-07-31 ENCOUNTER — Other Ambulatory Visit: Payer: Self-pay | Admitting: Cardiovascular Disease

## 2022-07-31 DIAGNOSIS — Z720 Tobacco use: Secondary | ICD-10-CM | POA: Diagnosis not present

## 2022-07-31 DIAGNOSIS — I739 Peripheral vascular disease, unspecified: Secondary | ICD-10-CM | POA: Insufficient documentation

## 2022-07-31 DIAGNOSIS — M79605 Pain in left leg: Secondary | ICD-10-CM | POA: Diagnosis not present

## 2022-07-31 DIAGNOSIS — I482 Chronic atrial fibrillation, unspecified: Secondary | ICD-10-CM

## 2022-07-31 DIAGNOSIS — I10 Essential (primary) hypertension: Secondary | ICD-10-CM

## 2022-07-31 DIAGNOSIS — E785 Hyperlipidemia, unspecified: Secondary | ICD-10-CM

## 2022-08-01 LAB — VAS US ABI WITH/WO TBI
Left ABI: 0.97
Right ABI: 1.09

## 2022-08-16 ENCOUNTER — Other Ambulatory Visit: Payer: Self-pay | Admitting: Cardiovascular Disease

## 2022-08-18 NOTE — Telephone Encounter (Signed)
Refill Request.  

## 2022-09-03 DIAGNOSIS — H353132 Nonexudative age-related macular degeneration, bilateral, intermediate dry stage: Secondary | ICD-10-CM | POA: Diagnosis not present

## 2022-10-17 ENCOUNTER — Other Ambulatory Visit: Payer: Self-pay | Admitting: Family

## 2022-10-17 DIAGNOSIS — Z1212 Encounter for screening for malignant neoplasm of rectum: Secondary | ICD-10-CM

## 2022-10-17 DIAGNOSIS — Z1211 Encounter for screening for malignant neoplasm of colon: Secondary | ICD-10-CM

## 2022-12-18 ENCOUNTER — Telehealth: Payer: Self-pay

## 2022-12-18 NOTE — Telephone Encounter (Signed)
No answer, no voicemail.

## 2022-12-18 NOTE — Telephone Encounter (Signed)
Copied from CRM [id number]. Topic: Clinical - Prescription Issue >> Dec 18, 2022  3:42 PM Amy B wrote: Reason for CRM: Patient's wife, Steffanie Dunn, is requesting a prescription for a lift chair for the patient.  She states she also needs to know the location for pick up.  Please call her, [phone].

## 2022-12-19 NOTE — Telephone Encounter (Signed)
No answer, no voicemail.

## 2022-12-20 DIAGNOSIS — I469 Cardiac arrest, cause unspecified: Secondary | ICD-10-CM | POA: Diagnosis not present

## 2022-12-20 DIAGNOSIS — R404 Transient alteration of awareness: Secondary | ICD-10-CM | POA: Diagnosis not present

## 2022-12-20 DIAGNOSIS — R0902 Hypoxemia: Secondary | ICD-10-CM | POA: Diagnosis not present

## 2022-12-20 DIAGNOSIS — Z743 Need for continuous supervision: Secondary | ICD-10-CM | POA: Diagnosis not present

## 2022-12-20 DIAGNOSIS — I499 Cardiac arrhythmia, unspecified: Secondary | ICD-10-CM | POA: Diagnosis not present

## 2022-12-22 NOTE — Telephone Encounter (Signed)
Closing encounter, pt passed away this weekend.

## 2023-01-05 ENCOUNTER — Telehealth: Payer: Self-pay | Admitting: Family

## 2023-01-05 NOTE — Telephone Encounter (Signed)
Copied from CRM [id number]. Topic: Medical Record Request - Records Request >> Jan 05, 2023  9:22 AM Jorje Guild R wrote: Reason for CRM:  Funeral home called in wanting to know if Dr. Jannifer Rodney has signed off on his death certificate.

## 2023-01-08 NOTE — Telephone Encounter (Signed)
Done

## 2023-01-14 DEATH — deceased
